# Patient Record
Sex: Female | Born: 1955 | Race: Black or African American | Hispanic: No | Marital: Married | Smoking: Former smoker
Health system: Southern US, Community
[De-identification: ages and names within clinical notes are randomized; demographics above are authoritative.]

## PROBLEM LIST (undated history)

## (undated) DIAGNOSIS — I517 Cardiomegaly: Secondary | ICD-10-CM

## (undated) DIAGNOSIS — G4733 Obstructive sleep apnea (adult) (pediatric): Secondary | ICD-10-CM

## (undated) DIAGNOSIS — E119 Type 2 diabetes mellitus without complications: Secondary | ICD-10-CM

## (undated) DIAGNOSIS — I5032 Chronic diastolic (congestive) heart failure: Secondary | ICD-10-CM

## (undated) DIAGNOSIS — N289 Disorder of kidney and ureter, unspecified: Secondary | ICD-10-CM

## (undated) DIAGNOSIS — I10 Essential (primary) hypertension: Secondary | ICD-10-CM

## (undated) DIAGNOSIS — R5381 Other malaise: Secondary | ICD-10-CM

## (undated) DIAGNOSIS — R569 Unspecified convulsions: Secondary | ICD-10-CM

## (undated) DIAGNOSIS — I272 Pulmonary hypertension, unspecified: Secondary | ICD-10-CM

## (undated) DIAGNOSIS — G35 Multiple sclerosis: Secondary | ICD-10-CM

## (undated) DIAGNOSIS — I639 Cerebral infarction, unspecified: Secondary | ICD-10-CM

## (undated) DIAGNOSIS — J449 Chronic obstructive pulmonary disease, unspecified: Secondary | ICD-10-CM

## (undated) HISTORY — PX: KNEE SURGERY: SHX244

## (undated) HISTORY — PX: MASS EXCISION: SHX2000

## (undated) HISTORY — DX: Multiple sclerosis: G35

## (undated) HISTORY — PX: TUBAL LIGATION: SHX77

## (undated) HISTORY — DX: Unspecified convulsions: R56.9

## (undated) HISTORY — DX: Cerebral infarction, unspecified: I63.9

## (undated) HISTORY — DX: Chronic obstructive pulmonary disease, unspecified: J44.9

## (undated) HISTORY — PX: CARPAL TUNNEL RELEASE: SHX101

## (undated) HISTORY — PX: CHOLECYSTECTOMY: SHX55

## (undated) SURGERY — RIGHT HEART CATH
Anesthesia: Moderate Sedation

---

## 2001-07-22 ENCOUNTER — Encounter: Payer: Self-pay | Admitting: Obstetrics and Gynecology

## 2001-07-22 ENCOUNTER — Encounter: Admission: RE | Admit: 2001-07-22 | Discharge: 2001-07-22 | Payer: Self-pay | Admitting: Obstetrics and Gynecology

## 2001-09-25 ENCOUNTER — Encounter: Payer: Self-pay | Admitting: Obstetrics and Gynecology

## 2001-09-25 ENCOUNTER — Encounter: Admission: RE | Admit: 2001-09-25 | Discharge: 2001-09-25 | Payer: Self-pay | Admitting: Obstetrics and Gynecology

## 2007-01-21 ENCOUNTER — Ambulatory Visit: Payer: Self-pay | Admitting: Cardiovascular Disease

## 2007-01-29 ENCOUNTER — Ambulatory Visit: Payer: Self-pay | Admitting: Cardiovascular Disease

## 2007-01-29 ENCOUNTER — Encounter (HOSPITAL_COMMUNITY): Admission: RE | Admit: 2007-01-29 | Discharge: 2007-02-28 | Payer: Self-pay | Admitting: Cardiovascular Disease

## 2011-02-24 NOTE — Assessment & Plan Note (Signed)
[facility]                       [place] [facility]ARDIOLOG[facility] OFFICE NOTE   NAME:Vegh, CALLEEN ALVIS                      MRN:          [id number]  DATE:01/21/2007                            DOB:          May 31, 1956    Ms. Hargett is a 55 year old patient referred by Dr. Harland Dingwall.  She has  uncontrolled hypertension and an abnormal EKG.  The patient has  multiple coronary risk factors including family history of coronary  disease, hypertension, and diabetes.  Her lipid status is unknown.   She is a nonsmoker.   The patient has been having some exertional dyspnea but not having any  significant chest pain, palpitations, PND, orthopnea.   Her EKG done with Dr. Eudelia Bunch office and here shows a significant LVH  with strain.  In talking to the patient she has had hypertension since  1993, however, due to financial reasons it has not been treated all this  time.  She was recently started on Lotrel by Dr. Harland Dingwall.   The patient has not had a recent stress test or other cardiac workup.  She said there was a question of whether or not she had heart  enlargement.   PAST MEDICAL HISTORY:  Remarkable for being dehydrated 2 years ago with  an overnight stay at [facility].  She has had a tubal ligation,  gallbladder surgery, and a mass under axilla removed.   SHE HAS NO KNOWN ALLERGIES.   She is a nonsmoker and does not drink.  She has had outpatient carpal  tunnel surgery.  She has been a diabetic for 2-3 years.   The patient is under a lot of stress lately.  Her mother has significant  vascular disease with previous MI, what sounds like mesenteric ischemia,  peripheral vascular disease, and is to go to [facility] to have surgery.   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  She says she has poor vision  but has not had any laser surgery.  There is no history of kidney  failure.  From a cardiac perspective  there is no exertional chest pain  or angina, there is no palpitations, PND, orthopnea, or syncope.  She  does not have significant claudication.   MEDICATIONS:  1. Lotrel 5/20.  2. Actos 30 a day.  3. Lantus 20 in the morning and 25 at night.  4. Glipizide 10 a day.   She is married, her husband's health is good.  She has 2 grown children  in their 30s, she is fairly sedentary, she is under a lot of stress  lately due to her mother's illness.   Family History positive for mother with DM and premature CAD   The patient's exam is remarkable for a blood pressure of 160/88, pulse  is 90 and regular.  HEENT:  Normal, carotids are normal without bruits.  LUNGS:  Clear.  There is an S1, S2, with an S4 gallop.  ABDOMEN:  Benign, there is no renal bruits, there is no hepatojugular  reflux.  Distal pulses are intact with no edema.  She does have  a bit of a  peripheral stocking glove neuropathy.  SKIN:  Warm and dry.   EKG shows sinus rhythm with LVH and strain.   IMPRESSION:  The patient's abnormal echocardiogram is simply from LVH  with strain.  She admits to a long period of time where her blood  pressure was not being treated.  I am not a big fan of Lotrel since the  fixed dosages of the medication are difficult to titrate.  Clearly her  blood pressure is still suboptimally controlled and she is relatively  tachycardic.  We will ad Coreg 12.5 b.i.d. to her Lotrel for starters.  The Coreg will have less insulin resistance than other beta blockers in  regards to her diabetes.   The patient needs a stress Myoview study.  At that point we can see how  her blood pressure control is and rule out coronary disease given her  multiple risk factors and abnormal echocardiogram.   The patient will also have a 2D echocardiogram.  She has a systolic  ejection murmur and at that point we can assess left ventricular cavity  size and LVH.  Further recommendations will be based on her response  to  Coreg, her exercise stress test, and then her echo.   I suspect the patient will do well so long as her risk factors,  particularly her diabetes and hypertension are well controlled.  She  will follow up with Dr. Harland Dingwall in regards to her hemoglobin A1c.  Also,  given her risk factors I suspect she should be on a statin drug.  She is  diabetic, overweight, and hypertensive.   Again, I will leave this up to Dr. Harland Dingwall, but I suspect that she would  strongly benefit from a statin drug     Peter C. Eden Emms, MD, [facility]  Electronically Signed    PCN/MedQ  DD: 01/21/2007  DT: 01/21/2007  Job #: [id number]

## 2011-02-24 NOTE — Procedures (Signed)
Gail Chapman, Gail Chapman               ACCOUNT NO.:  [id number]   MEDICAL RECORD NO.:  [id number]          PATIENT TYPE:  REC   LOCATION:                                FACILITY:  [facility]   PHYSICIAN:  Peter C. Eden Emms, MD, FACCDATE OF BIRTH:  Jun 30, 1956   DATE OF PROCEDURE:  DATE OF DISCHARGE:                                ECHOCARDIOGRAM   A 2D ECHOCARDIOGRAM:   INDICATIONS:  Check LV function, chest pain, LVH, abnormal EKG.   Left ventricular cavity size is mildly dilated.  There was severe LVH.  Septal thickness was 18-19 mm.  There was no evidence of outflow tract  destruction.  There were no regional wall motion abnormalities.  Overall  ejection fraction 55%.  Mitral valve was mildly thickened with trivial  MR.  Aortic valve was trileaflet and normal.  There was mild left atrial  enlargement.  Right-sided cardiac chambers were normal.  There was no  evidence of pulmonary hypertension.  Subcostal imaging revealed a small  pericardial effusion with no evidence of tamponade.  There was no ASD,  VSD or source of embolus.   M-mode measurements included an aortic diameter of 25 mm, left atrial  diameter of 40 mm, septal thickness 18-19 mm, posterior wall thickness  17 mm, LV diastolic dimension 41 mm, LV systolic dimension 28 mm.   FINAL IMPRESSION:  1. Severe LVH with no wall motion abnormalities.  EF 55%.  2. Trivial MR.  3. Normal aortic valve.  4. Mild left atrial enlargement.  5. Normal right-sided cardiac chambers.  6. Small pericardial effusion.      Noralyn Pick. Eden Emms, MD, [facility]  Electronically Signed     PCN/MEDQ  D:  01/29/2007  T:  01/29/2007  Job:  [id number]

## 2013-02-19 ENCOUNTER — Inpatient Hospital Stay (HOSPITAL_COMMUNITY): Payer: BC Managed Care – PPO

## 2013-02-19 ENCOUNTER — Emergency Department (HOSPITAL_COMMUNITY): Payer: BC Managed Care – PPO

## 2013-02-19 ENCOUNTER — Encounter (HOSPITAL_COMMUNITY): Payer: Self-pay | Admitting: Emergency Medicine

## 2013-02-19 ENCOUNTER — Inpatient Hospital Stay (HOSPITAL_COMMUNITY)
Admission: EM | Admit: 2013-02-19 | Discharge: 2013-03-06 | DRG: 549 | Disposition: A | Discharge status: Home Health | Payer: BC Managed Care – PPO | Attending: Internal Medicine | Admitting: Internal Medicine

## 2013-02-19 DIAGNOSIS — Z87891 Personal history of nicotine dependence: Secondary | ICD-10-CM

## 2013-02-19 DIAGNOSIS — K3184 Gastroparesis: Secondary | ICD-10-CM | POA: Diagnosis present

## 2013-02-19 DIAGNOSIS — I272 Pulmonary hypertension, unspecified: Secondary | ICD-10-CM | POA: Diagnosis present

## 2013-02-19 DIAGNOSIS — I319 Disease of pericardium, unspecified: Secondary | ICD-10-CM | POA: Diagnosis present

## 2013-02-19 DIAGNOSIS — I5033 Acute on chronic diastolic (congestive) heart failure: Secondary | ICD-10-CM | POA: Diagnosis present

## 2013-02-19 DIAGNOSIS — R4182 Altered mental status, unspecified: Secondary | ICD-10-CM

## 2013-02-19 DIAGNOSIS — I509 Heart failure, unspecified: Secondary | ICD-10-CM | POA: Diagnosis present

## 2013-02-19 DIAGNOSIS — G4730 Sleep apnea, unspecified: Secondary | ICD-10-CM | POA: Insufficient documentation

## 2013-02-19 DIAGNOSIS — N179 Acute kidney failure, unspecified: Secondary | ICD-10-CM

## 2013-02-19 DIAGNOSIS — J9600 Acute respiratory failure, unspecified whether with hypoxia or hypercapnia: Secondary | ICD-10-CM

## 2013-02-19 DIAGNOSIS — I161 Hypertensive emergency: Secondary | ICD-10-CM | POA: Diagnosis present

## 2013-02-19 DIAGNOSIS — I169 Hypertensive crisis, unspecified: Secondary | ICD-10-CM

## 2013-02-19 DIAGNOSIS — I130 Hypertensive heart and chronic kidney disease with heart failure and stage 1 through stage 4 chronic kidney disease, or unspecified chronic kidney disease: Principal | ICD-10-CM | POA: Diagnosis present

## 2013-02-19 DIAGNOSIS — E46 Unspecified protein-calorie malnutrition: Secondary | ICD-10-CM | POA: Diagnosis present

## 2013-02-19 DIAGNOSIS — G9340 Encephalopathy, unspecified: Secondary | ICD-10-CM | POA: Diagnosis present

## 2013-02-19 DIAGNOSIS — D631 Anemia in chronic kidney disease: Secondary | ICD-10-CM | POA: Diagnosis present

## 2013-02-19 DIAGNOSIS — E119 Type 2 diabetes mellitus without complications: Secondary | ICD-10-CM

## 2013-02-19 DIAGNOSIS — Z6827 Body mass index (BMI) 27.0-27.9, adult: Secondary | ICD-10-CM

## 2013-02-19 DIAGNOSIS — I10 Essential (primary) hypertension: Secondary | ICD-10-CM

## 2013-02-19 DIAGNOSIS — E1129 Type 2 diabetes mellitus with other diabetic kidney complication: Secondary | ICD-10-CM | POA: Diagnosis present

## 2013-02-19 DIAGNOSIS — Z79899 Other long term (current) drug therapy: Secondary | ICD-10-CM

## 2013-02-19 DIAGNOSIS — G4733 Obstructive sleep apnea (adult) (pediatric): Secondary | ICD-10-CM | POA: Diagnosis present

## 2013-02-19 DIAGNOSIS — I5032 Chronic diastolic (congestive) heart failure: Secondary | ICD-10-CM

## 2013-02-19 DIAGNOSIS — Z794 Long term (current) use of insulin: Secondary | ICD-10-CM

## 2013-02-19 DIAGNOSIS — E871 Hypo-osmolality and hyponatremia: Secondary | ICD-10-CM | POA: Diagnosis not present

## 2013-02-19 DIAGNOSIS — E872 Acidosis, unspecified: Secondary | ICD-10-CM | POA: Diagnosis not present

## 2013-02-19 DIAGNOSIS — N039 Chronic nephritic syndrome with unspecified morphologic changes: Secondary | ICD-10-CM | POA: Diagnosis present

## 2013-02-19 DIAGNOSIS — R7989 Other specified abnormal findings of blood chemistry: Secondary | ICD-10-CM

## 2013-02-19 DIAGNOSIS — I2789 Other specified pulmonary heart diseases: Secondary | ICD-10-CM | POA: Diagnosis present

## 2013-02-19 DIAGNOSIS — R5381 Other malaise: Secondary | ICD-10-CM | POA: Diagnosis present

## 2013-02-19 DIAGNOSIS — N189 Chronic kidney disease, unspecified: Secondary | ICD-10-CM

## 2013-02-19 HISTORY — DX: Other malaise: R53.81

## 2013-02-19 HISTORY — DX: Type 2 diabetes mellitus without complications: E11.9

## 2013-02-19 HISTORY — DX: Essential (primary) hypertension: I10

## 2013-02-19 HISTORY — DX: Pulmonary hypertension, unspecified: I27.20

## 2013-02-19 HISTORY — DX: Chronic diastolic (congestive) heart failure: I50.32

## 2013-02-19 HISTORY — DX: Obstructive sleep apnea (adult) (pediatric): G47.33

## 2013-02-19 HISTORY — DX: Cardiomegaly: I51.7

## 2013-02-19 LAB — URINALYSIS, ROUTINE W REFLEX MICROSCOPIC
Bilirubin Urine: NEGATIVE
Specific Gravity, Urine: 1.02 (ref 1.005–1.030)
Urobilinogen, UA: 0.2 mg/dL (ref 0.0–1.0)

## 2013-02-19 LAB — URINE MICROSCOPIC-ADD ON

## 2013-02-19 LAB — CBC
HCT: 30.4 % — ABNORMAL LOW (ref 36.0–46.0)
Hemoglobin: 9.5 g/dL — ABNORMAL LOW (ref 12.0–15.0)
Hemoglobin: 9.4 g/dL — ABNORMAL LOW (ref 12.0–15.0)
MCH: 26.3 pg (ref 26.0–34.0)
MCH: 26.9 pg (ref 26.0–34.0)
MCHC: 32 g/dL (ref 30.0–36.0)
MCV: 84.2 fL (ref 78.0–100.0)
MCV: 84.2 fL (ref 78.0–100.0)
RBC: 3.61 MIL/uL — ABNORMAL LOW (ref 3.87–5.11)
RBC: 3.49 MIL/uL — ABNORMAL LOW (ref 3.87–5.11)

## 2013-02-19 LAB — MRSA PCR SCREENING: MRSA by PCR: NEGATIVE

## 2013-02-19 LAB — CARBOXYHEMOGLOBIN
Carboxyhemoglobin: 1.6 % — ABNORMAL HIGH (ref 0.5–1.5)
Methemoglobin: 1.2 % (ref 0.0–1.5)

## 2013-02-19 LAB — BASIC METABOLIC PANEL
BUN: 33 mg/dL — ABNORMAL HIGH (ref 6–23)
CO2: 28 mEq/L (ref 19–32)
Calcium: 9 mg/dL (ref 8.4–10.5)
Creatinine, Ser: 2.02 mg/dL — ABNORMAL HIGH (ref 0.50–1.10)
Glucose, Bld: 136 mg/dL — ABNORMAL HIGH (ref 70–99)

## 2013-02-19 LAB — CREATININE, SERUM: Creatinine, Ser: 2.13 mg/dL — ABNORMAL HIGH (ref 0.50–1.10)

## 2013-02-19 LAB — MAGNESIUM: Magnesium: 2.1 mg/dL (ref 1.5–2.5)

## 2013-02-19 MED ORDER — NITROGLYCERIN IN D5W 200-5 MCG/ML-% IV SOLN
2.0000 ug/min | INTRAVENOUS | Status: DC
  Administered 2013-02-19: 30 ug/min via INTRAVENOUS
  Administered 2013-02-20: 160 ug/min via INTRAVENOUS
  Administered 2013-02-20: 200 ug/min via INTRAVENOUS
  Filled 2013-02-19 (×2): qty 250

## 2013-02-19 MED ORDER — POTASSIUM CHLORIDE CRYS ER 20 MEQ PO TBCR
20.0000 meq | EXTENDED_RELEASE_TABLET | Freq: Every day | ORAL | Status: DC
  Administered 2013-02-20: 20 meq via ORAL
  Filled 2013-02-19: qty 1

## 2013-02-19 MED ORDER — SODIUM CHLORIDE 0.9 % IV SOLN
250.0000 mL | INTRAVENOUS | Status: DC | PRN
  Administered 2013-02-19: 250 mL via INTRAVENOUS

## 2013-02-19 MED ORDER — SODIUM CHLORIDE 0.9 % IJ SOLN
3.0000 mL | Freq: Two times a day (BID) | INTRAMUSCULAR | Status: DC
  Administered 2013-02-19 – 2013-02-22 (×6): 3 mL via INTRAVENOUS
  Administered 2013-02-23: 11:00:00 via INTRAVENOUS
  Administered 2013-02-23: 3 mL via INTRAVENOUS
  Administered 2013-02-24: 09:00:00 via INTRAVENOUS
  Administered 2013-02-25 (×2): 3 mL via INTRAVENOUS

## 2013-02-19 MED ORDER — INSULIN ASPART 100 UNIT/ML ~~LOC~~ SOLN
0.0000 [IU] | Freq: Three times a day (TID) | SUBCUTANEOUS | Status: DC
  Administered 2013-02-20 – 2013-02-26 (×4): 2 [IU] via SUBCUTANEOUS

## 2013-02-19 MED ORDER — DOCUSATE SODIUM 100 MG PO CAPS
100.0000 mg | ORAL_CAPSULE | Freq: Two times a day (BID) | ORAL | Status: DC
  Administered 2013-02-19 – 2013-02-26 (×13): 100 mg via ORAL
  Filled 2013-02-19 (×15): qty 1

## 2013-02-19 MED ORDER — ACETAMINOPHEN 325 MG PO TABS
650.0000 mg | ORAL_TABLET | ORAL | Status: DC | PRN
  Administered 2013-02-19: 650 mg via ORAL
  Filled 2013-02-19: qty 2

## 2013-02-19 MED ORDER — FUROSEMIDE 10 MG/ML IJ SOLN
40.0000 mg | Freq: Once | INTRAMUSCULAR | Status: AC
  Administered 2013-02-19: 40 mg via INTRAVENOUS
  Filled 2013-02-19: qty 4

## 2013-02-19 MED ORDER — SODIUM CHLORIDE 0.9 % IJ SOLN: 3.0000 mL | INTRAMUSCULAR | Status: DC | PRN

## 2013-02-19 MED ORDER — NITROGLYCERIN IN D5W 200-5 MCG/ML-% IV SOLN
5.0000 ug/min | Freq: Once | INTRAVENOUS | Status: AC
  Administered 2013-02-19: 5 ug/min via INTRAVENOUS
  Filled 2013-02-19: qty 250

## 2013-02-19 MED ORDER — ATORVASTATIN CALCIUM 40 MG PO TABS
40.0000 mg | ORAL_TABLET | Freq: Every day | ORAL | Status: DC
  Administered 2013-02-19 – 2013-02-26 (×8): 40 mg via ORAL
  Filled 2013-02-19 (×8): qty 1

## 2013-02-19 MED ORDER — HYDRALAZINE HCL 20 MG/ML IJ SOLN
20.0000 mg | Freq: Once | INTRAMUSCULAR | Status: AC
  Administered 2013-02-19: 20 mg via INTRAVENOUS
  Filled 2013-02-19: qty 1

## 2013-02-19 MED ORDER — FUROSEMIDE 10 MG/ML IJ SOLN
20.0000 mg/h | INTRAVENOUS | Status: DC
  Administered 2013-02-19 – 2013-02-20 (×2): 20 mg/h via INTRAVENOUS
  Filled 2013-02-19 (×5): qty 25

## 2013-02-19 MED ORDER — INSULIN ASPART 100 UNIT/ML ~~LOC~~ SOLN
4.0000 [IU] | Freq: Three times a day (TID) | SUBCUTANEOUS | Status: DC
  Administered 2013-02-20 – 2013-02-21 (×4): 4 [IU] via SUBCUTANEOUS
  Administered 2013-02-23 (×3): via SUBCUTANEOUS
  Administered 2013-02-26: 4 [IU] via SUBCUTANEOUS

## 2013-02-19 MED ORDER — AMLODIPINE BESYLATE 5 MG PO TABS
5.0000 mg | ORAL_TABLET | Freq: Every day | ORAL | Status: DC
  Administered 2013-02-19 – 2013-02-22 (×4): 5 mg via ORAL
  Filled 2013-02-19 (×5): qty 1

## 2013-02-19 MED ORDER — INSULIN DETEMIR 100 UNIT/ML ~~LOC~~ SOLN
14.0000 [IU] | Freq: Every day | SUBCUTANEOUS | Status: DC
  Administered 2013-02-19 – 2013-02-25 (×7): 14 [IU] via SUBCUTANEOUS
  Filled 2013-02-19 (×9): qty 0.14

## 2013-02-19 MED ORDER — HYDRALAZINE HCL 25 MG PO TABS
25.0000 mg | ORAL_TABLET | Freq: Three times a day (TID) | ORAL | Status: DC
  Administered 2013-02-19 – 2013-02-21 (×5): 25 mg via ORAL
  Filled 2013-02-19 (×8): qty 1

## 2013-02-19 MED ORDER — ASPIRIN 81 MG PO CHEW
324.0000 mg | CHEWABLE_TABLET | Freq: Once | ORAL | Status: AC
  Administered 2013-02-19: 324 mg via ORAL
  Filled 2013-02-19: qty 4

## 2013-02-19 MED ORDER — HYDRALAZINE HCL 20 MG/ML IJ SOLN
5.0000 mg | INTRAMUSCULAR | Status: DC | PRN
  Administered 2013-02-19 – 2013-02-20 (×2): 5 mg via INTRAVENOUS
  Filled 2013-02-19 (×2): qty 1

## 2013-02-19 MED ORDER — HEPARIN SODIUM (PORCINE) 5000 UNIT/ML IJ SOLN
5000.0000 [IU] | Freq: Three times a day (TID) | INTRAMUSCULAR | Status: DC
  Administered 2013-02-19 – 2013-02-20 (×2): 5000 [IU] via SUBCUTANEOUS
  Filled 2013-02-19 (×5): qty 1

## 2013-02-19 MED ORDER — ONDANSETRON HCL 4 MG/2ML IJ SOLN
4.0000 mg | Freq: Four times a day (QID) | INTRAMUSCULAR | Status: DC | PRN
  Administered 2013-02-21 – 2013-02-26 (×2): 4 mg via INTRAVENOUS
  Filled 2013-02-19 (×2): qty 2

## 2013-02-19 MED ORDER — CARVEDILOL 12.5 MG PO TABS
12.5000 mg | ORAL_TABLET | Freq: Two times a day (BID) | ORAL | Status: DC
  Administered 2013-02-20 – 2013-02-23 (×7): 12.5 mg via ORAL
  Filled 2013-02-19 (×9): qty 1

## 2013-02-19 MED ORDER — ALPRAZOLAM 0.25 MG PO TABS
0.2500 mg | ORAL_TABLET | Freq: Two times a day (BID) | ORAL | Status: DC | PRN
  Administered 2013-02-20: 0.25 mg via ORAL
  Filled 2013-02-19: qty 1

## 2013-02-19 MED ORDER — PANTOPRAZOLE SODIUM 40 MG PO TBEC
40.0000 mg | DELAYED_RELEASE_TABLET | Freq: Every day | ORAL | Status: DC
  Administered 2013-02-19 – 2013-02-23 (×5): 40 mg via ORAL
  Filled 2013-02-19 (×5): qty 1

## 2013-02-19 NOTE — ED Provider Notes (Signed)
History     CSN: [id number]  Arrival date & time 02/19/13  1353   None     Chief Complaint  Patient presents with  . Shortness of Breath    (Consider location/radiation/quality/duration/timing/severity/associated sxs/prior treatment) HPI  57 year old female with a past medical history of diabetes, hypertension, pulmonary hypertension and congestive heart failure presents to the emergency department with chief complaint of shortness of breath.  When asked what brings her to the emergency department the patient states "heart failure."  Patient states that over the past 3 months she has had a 30 pound weight gain.  She is from [place].  She and her husband to the history.  They expressed frustration with what they considered to be an adequate care for her CHF.  Patient states that she is alert and worsening shortness of breath.  She is normally at home and uses CPAP at night and is on 2 L of oxygen via nasal cannula. Patient also has a history of COPD.  She denies any wheezing, chest tightness. Denies fevers, chills, myalgias, arthralgias. Denies  chest tightness or pressure or pain, radiation to left arm, jaw or back, or diaphoresis. Denies dysuria, flank pain, suprapubic pain, frequency, urgency, or hematuria. Denies headaches, light headedness, weakness, visual disturbances. Denies abdominal pain, nausea, vomiting, diarrhea or constipation. She has been taking 20 mg of lasix     Past Medical History  Diagnosis Date  . Diabetes mellitus without complication   . CHF (congestive heart failure)   . Pulmonary hypertension   . Sleep apnea     Past Surgical History  Procedure Laterality Date  . Knee surgery      History reviewed. No pertinent family history.  History  Substance Use Topics  . Smoking status: Former Games developer  . Smokeless tobacco: Not on file  . Alcohol Use: No    OB History   Grav Para Term Preterm Abortions TAB SAB Ect Mult Living                   Review of Systems  Ten systems reviewed and are negative for acute change, except as noted in the HPI.   Allergies  Vancomycin  Home Medications  No current outpatient prescriptions on file.  BP 224/93  Pulse 82  Temp(Src) 99.4 F (37.4 C) (Oral)  Resp 23  Ht 5' 1.5" (1.562 m)  Wt 213 lb (96.616 kg)  BMI 39.6 kg/m2  SpO2 98%  Physical Exam Physical Exam  Nursing note and vitals reviewed. Constitutional: She is oriented to person, place, and time. She appears chronically ill and dyspneic. HENT:  Head: Normocephalic and atraumatic.  Eyes: Conjunctivae normal and EOM are normal. Pupils are equal, round, and reactive to light. No scleral icterus.  Neck: Normal range of motion.  Cardiovascular: Normal rate, regular rhythm and normal heart sounds.  No murmur heard. 2 + pitting edema of the LE. Swelling of the hands. Pulmonary/Chest: Poor air movement. Appears tight. No abnormal breath sound. Abdominal: Soft. Distended. +fluid wave Neurological: She is alert and oriented to person, place, and time.  Skin: Skin is warm and dry. She is not diaphoretic.    ED Course  Procedures (including critical care time)  Labs Reviewed  PRO B NATRIURETIC PEPTIDE - Abnormal; Notable for the following:    Pro B Natriuretic peptide (BNP) 45515.0 (*)    All other components within normal limits  CBC - Abnormal; Notable for the following:    RBC 3.61 (*)  Hemoglobin 9.5 (*)    HCT 30.4 (*)    RDW 17.7 (*)    All other components within normal limits  BASIC METABOLIC PANEL - Abnormal; Notable for the following:    Glucose, Bld 136 (*)    BUN 33 (*)    Creatinine, Ser 2.02 (*)    GFR calc non Af Amer 27 (*)    GFR calc Af Amer 31 (*)    All other components within normal limits  URINALYSIS, ROUTINE W REFLEX MICROSCOPIC  POCT I-STAT TROPONIN I   No results found.  Date: 02/19/2013  Rate: 84  Rhythm: normal sinus rhythm  QRS Axis: normal  Intervals: normal  ST/T Wave  abnormalities: nonspecific T wave changes  Conduction Disutrbances:none  Narrative Interpretation:   Old EKG Reviewed: none available   . 1. Acute exacerbation of CHF (congestive heart failure)   2. Hypertension   3. Serum creatinine raised       MDM  2:52 PM BP 224/93  Pulse 82  Temp(Src) 99.4 F (37.4 C) (Oral)  Resp 23  Ht 5' 1.5" (1.562 m)  Wt 213 lb (96.616 kg)  BMI 39.6 kg/m2  SpO2 98% Patient with hypertension. Large edematous abdomen. Swelling of the extremities. CHF, respiratory and acs work up in progress. Symmetric swelling throughout.  3:49 PM Patient with poor cxr quality. Appears to be in acute CHF. She has a very highly elevated Pro-BNP. I have stared the patient on 40 mg IV lasix, nitro to reduce afterload and asa.    3:55 PM I have spoken with Trish from [facility] cardiology. The patetient will be admitted for acute CHF. Patient with elevated creatinine, no previous to compare. Her ekg is non concerning for acute ischemia. The patient appears reasonably stabilized for admission considering the current resources, flow, and capabilities available in the ED at this time, and I doubt any other [facility] requiring further screening and/or treatment in the ED prior to admission.        Arthor Captain, PA-C 02/19/13 1558

## 2013-02-19 NOTE — ED Notes (Addendum)
BIB family. C/o SOB. States Hx of CHF. NO CP at this time. Recent weight gain of >5 lbs per patient. 3L [place] at home. Uses CPAP at night. Patient not on O2 when brought to ED by family. Patient states recent rehab admission, she had left AMA due to "they werent taking care of me. Patient residing at home with husband

## 2013-02-19 NOTE — ED Provider Notes (Signed)
Medical screening examination/treatment/procedure(s) were conducted as a shared visit with non-physician practitioner(s) and myself.  I personally evaluated the patient during the encounter  SOB, DOE, orthopnea x 1 month, recently seen in [place] and left rehab when continued to gain weight. No meds x 4 days. Denies chest pain. Bibasilar rales, +3 edema to mid thigh, +JVD and orthopnea. ASA, lasix, NTG gtt for hypertensive crisis. BP 188/80  Pulse 90  Temp(Src) 99.4 F (37.4 C) (Oral)  Resp 15  Ht 5' 1.5" (1.562 m)  Wt 213 lb (96.616 kg)  BMI 39.6 kg/m2  SpO2 99%   [name], MD 02/19/13 Ernestina [place]

## 2013-02-19 NOTE — ED Notes (Signed)
Cardiology PA at the bedside

## 2013-02-19 NOTE — Procedures (Addendum)
Central Venous Catheter Insertion Procedure Note Gail Chapman [id number] 01-27-1956  Procedure: Insertion of Central Venous Catheter Indications: Drug and/or fluid administration  Procedure Details Consent: Risks of procedure as well as the alternatives and risks of each were explained to the (patient/caregiver).  Consent for procedure obtained. Time Out: Verified patient identification, verified procedure, site/side was marked, verified correct patient position, special equipment/implants available, medications/allergies/relevent history reviewed, required imaging and test results available.  Performed  Maximum sterile technique was used including antiseptics, cap, gloves, gown, hand hygiene, mask and sheet. Skin prep: Chlorhexidine; local anesthetic administered A antimicrobial bonded/coated triple lumen catheter was placed in the right internal jugular vein using the Seldinger technique.  Evaluation Blood flow good Complications: No apparent complications Patient did tolerate procedure well. Chest X-ray ordered to verify placement.  CXR: pending.  Roby Spalla 02/19/2013, 6:06 PM

## 2013-02-19 NOTE — ED Notes (Signed)
Dr. Kittie Plater at bedside placing right internal jugular catheter. Pt calm, cooperative.

## 2013-02-19 NOTE — H&P (Addendum)
Patient ID: Gail Chapman MRN: [id number], DOB/AGE: Aug 25, 1956   Admit date: 02/19/2013  Primary Physician: None Primary Cardiologist: previously seen by P. Gail Emms, MD in [place] in 2008.  More recently she has been seeing Dr. Rockne Chapman and Dr. Ladona Chapman with [facility].  Pt. Profile:  57 y/o female with h/o HTN and DM, who presented to the ED today with dyspnea and HTN.  Problem List  Past Medical History  Diagnosis Date  . Diabetes mellitus without complication   . Chronic diastolic CHF (congestive heart failure)     a. 01/2007 MV: No isch/infarct, nl EF;  b. 2012 Cath: "normal" per patient.  Performed in [place], [place] by Dr. Graciela Chapman.  . Pulmonary hypertension     a. on home O2 @ 3lpm 24hrs/day  . HTN (hypertension)     a. Dx in 1993.  Marland Kitchen LVH (left ventricular hypertrophy)   . OSA (obstructive sleep apnea)     a. uses CPAP  . Physical deconditioning     Past Surgical History  Procedure Laterality Date  . Knee surgery    . Carpal tunnel release    . Tubal ligation    . Cholecystectomy    . Mass excision      a. under axilla.    Allergies  Allergies  Allergen Reactions  . Clonidine Derivatives     Hand itching  . Hydralazine     Visual disturbances   . Labetalol     Fatigue   . Vancomycin Rash   HPI  57 y/o female with a h/o HTN, DM, diastolic HF and PAH.  She saw Dr. Eden Chapman in 2008 secondary to abnl ECG, later felt to represent LVH.  She underwent stress testing that was negative.  She has not f/u with [place] since.  She has however continued to f/u with cardiology in [place].  She has struggled with difficult to control htn and recurrent CHF over the years.  She underwent diagnostic cath about 2 yrs ago by Dr. Graciela Chapman and was told that her coronaries were fine but that she had pulmonary hypertension.  She has been on a variety of antihypertensive regimens and also home O2.  Earlier this year, she began to experience progressive weight gain,  dyspnea, and orthopnea.  Her weight increased from roughly 170 to the low 200's over a one month period or so and she was admitted to [facility] in April.  After a 1 week stay, during which she had attempted diuresis with very limited success, she was discharged to rehab, at a weight of 208 lbs, secondary to deconditioning.  While in rehab, she continued to experience dyspnea with any activity, increasing abdominal girth, and orthopnea.  She also noted fatigue and sleepiness, which she attributes to labetalol therapy, which was started a few months ago.  Her weight in rehab climbed to 213 and she and her husband decided to leave rehab on this past Saturday because they felt like they were not getting appropriate care.  Since Saturday, she has been off of all of her medications and has progressively felt worse.  Her husband brought her to the [facility] ED today because they no longer wish to be cared for in [place].  Here, she is sitting up because she becomes dyspneic with any degree of sitting back.  She denies chest pain.  ECG is non-acute.  Initial troponin is nl, while pBNP is elevated @ 45,515.  Home Medications  Prior to Admission medications   Medication  Sig Start Date End Date Taking? Authorizing Provider  atorvastatin (LIPITOR) 40 MG tablet Take 40 mg by mouth daily.   Yes Historical Provider, MD  docusate sodium (COLACE) 100 MG capsule Take 100 mg by mouth 2 (two) times daily.   Yes Historical Provider, MD  furosemide (LASIX) 40 MG tablet Take 40 mg by mouth daily.   Yes Historical Provider, MD  insulin aspart (NOVOLOG) 100 UNIT/ML injection Inject 4 Units into the skin 3 (three) times daily with meals. Per sliding scale   Yes Historical Provider, MD  insulin detemir (LEVEMIR) 100 UNIT/ML injection Inject 14 Units into the skin at bedtime. Hold if CBG<150   Yes Historical Provider, MD  labetalol (NORMODYNE) 200 MG tablet Take 200 mg by mouth 3 (three) times daily.   Yes Historical Provider,  MD  NIFEdipine (PROCARDIA-XL/ADALAT-CC/NIFEDICAL-XL) 30 MG 24 hr tablet Take 60 mg by mouth daily.   Yes Historical Provider, MD  pantoprazole (PROTONIX) 40 MG tablet Take 40 mg by mouth daily.   Yes Historical Provider, MD  promethazine (PHENERGAN) 25 MG suppository Place 25 mg rectally at bedtime.   Yes Historical Provider, MD   Family History  Family History  Problem Relation Age of Onset  . Diabetes Mother   . CAD Mother    Social History  History   Social History  . Marital Status: Married    Spouse Name: N/A    Number of Children: N/A  . Years of Education: N/A   Occupational History  . Not on file.   Social History Main Topics  . Smoking status: Former Games developer  . Smokeless tobacco: Not on file  . Alcohol Use: No  . Drug Use: No  . Sexually Active: Not on file   Other Topics Concern  . Not on file   Social History Narrative   Lives in [place] with her husband.  They have 2 grown children.  She is on disability.    Review of Systems General:  No chills, fever, night sweats or weight changes.  Cardiovascular:  No chest pain, +++ dyspnea on exertion and at rest, LEE edema, orthopnea, paroxysmal nocturnal dyspnea. Dermatological: No rash, lesions/masses Respiratory: No cough, +++ dyspnea Urologic: No hematuria, dysuria Abdominal:   Significant increase in abd girth with pitting abdominal edema.  No nausea, vomiting, diarrhea, bright red blood per rectum, melena, or hematemesis Neurologic:  No visual changes, wkns, changes in mental status. All other systems reviewed and are otherwise negative except as noted above.  Physical Exam  Blood pressure 246/109, pulse 85, temperature 99.4 F (37.4 C), temperature source Oral, resp. rate 25, height 5' 1.5" (1.562 m), weight 213 lb (96.616 kg), SpO2 97.00%.  General: Pleasant, + orthopnea. Psych: Normal affect. Neuro: Alert and oriented X 3. Moves all extremities spontaneously. HEENT: Normal  Neck: Supple without  bruits.  Neck veins to jaw. Carotids 2+ bilaterally without bruits Lungs:  Resp regular and unlabored, crackles in right base. Heart: RRR no s3, + s4, 2/6 TR Abdomen: protuberant, firm and edematous.  nontender, BS + x 4.  Extremities: No clubbing, cyanosis.  3-4+ bilat LEE to thighs.  bilat hand edema. DP/PT/Radials 2+ and equal bilaterally.  Labs  Trop i, poc: 0.02  PBNP: 45,515  Lab Results  Component Value Date   WBC 6.7 02/19/2013   HGB 9.5* 02/19/2013   HCT 30.4* 02/19/2013   MCV 84.2 02/19/2013   PLT 223 02/19/2013     Recent Labs Lab 02/19/13 1429  NA 141  K  4.5  CL 105  CO2 28  BUN 33*  CREATININE 2.02*  CALCIUM 9.0  GLUCOSE 136*   Radiology/Studies  Dg Chest 2 View  02/19/2013   *RADIOLOGY REPORT*  Clinical Data: Chest pain.  Short of breath.  Diabetic.  CHF.  CHEST - 2 VIEW  Comparison: None.  Findings: Technically suboptimal examination.  This is due to both portable technique and obese body habitus.  The pannus projects over the inferior chest.  The cardiopericardial silhouette appears enlarged.  Some this may represent pericardial fat pad.  Lung parenchyma is difficult to evaluate.   On the lateral view, there are small bilateral pleural effusions and thickening of the fissures.  The lateral view suggests a moderate pulmonary edema. Lateral view is actually more diagnostic on the frontal view.  IMPRESSION:  1.  Markedly suboptimal study due to obese habitus and technique. 2.  Overall, the appearance of the chest suggests moderate pulmonary edema and CHF.   Original Report Authenticated By: Andreas Newport, M.D.   ECG: Nsr, 84, inflat twi.  ASSESSMENT AND PLAN  1.  Acute on chronic diastolic chf/Hypertensive urgency:  Pt presents with acute on chronic volume overload likely exacerbated by coming off of all of her medicines this past weekend with marked resultant hypertension.  She has massive volume overload on exam, though from her perspective, its barely changed in  over a month.  Admit to 2900stepdown.  Aggressively diurese with lasix infusion (central line to be placed) and attempt to slowly bring BP down.  IV NTG has been started here in the ED.  She is not willing to take labetalol any longer as she feels that this causes her to be "drugged up" and sleepy.  She was on carvedilol in the past (started by PN in 2008) and thinks that she tolerated that just fine.  Therefor, will initiate carvedilol 12.5 bid.  Her creatinine is elevated and thus will avoid acei/arb at this time.  She does not tolerate hydralazine 2/2 visual disturbances however acutely, we will use this intravenously to get her pressure down.  She has had itching of her hands with clonidine.  Will add amlodipine and continually reassess her BP as she will likely require an additional agent(s) and further titration of meds.  Check echo.   2.  HTN:  As above.  3. Pulm HTN:  Echo.  Cont O2.  4.  OSA:  Cont cpap.  5.  DM:  Add ssi.  6.  Acute renal failure:  Creat 2.02 - ? Chronicity.  Follow closely with diuresis.  7. Acute respiratory failure  Signed, Nicolasa Ducking, NP 02/19/2013, 4:33 PM  Patient seen and examined independently. Gilford Raid, NP note reviewed carefully - agree with his assessment and plan. I have edited the note based on my findings.   Ms. Louth presents with massive edema/anasarca in setting of hypertensive crisis and renal failure. Suspect most of renal dysfunction is chronic. 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   On arrival BP 262/110. BNP 45K. She has intolerances to multi anti-HTN meds which have complicated her care. Now on IV NTG. We have ordered hydralazine, amlodipine and carvedilol. Goal is to get SBP in the 150-170 range.   Suspect she has severe diastolic HF with secondary PH. Will start IV lasix gtt at 20/hr and see response. Place in SDU to  follow CVPs and co-ox.  Check echo. Ultrafiltration may also be a consideration.  Daniel Bensimhon,MD 6:06 PM

## 2013-02-20 ENCOUNTER — Inpatient Hospital Stay (HOSPITAL_COMMUNITY): Payer: BC Managed Care – PPO

## 2013-02-20 DIAGNOSIS — I10 Essential (primary) hypertension: Secondary | ICD-10-CM

## 2013-02-20 DIAGNOSIS — I509 Heart failure, unspecified: Secondary | ICD-10-CM

## 2013-02-20 DIAGNOSIS — I5033 Acute on chronic diastolic (congestive) heart failure: Secondary | ICD-10-CM

## 2013-02-20 DIAGNOSIS — N189 Chronic kidney disease, unspecified: Secondary | ICD-10-CM

## 2013-02-20 DIAGNOSIS — I319 Disease of pericardium, unspecified: Secondary | ICD-10-CM

## 2013-02-20 DIAGNOSIS — J9600 Acute respiratory failure, unspecified whether with hypoxia or hypercapnia: Secondary | ICD-10-CM

## 2013-02-20 LAB — CARBOXYHEMOGLOBIN
Carboxyhemoglobin: 1.6 % — ABNORMAL HIGH (ref 0.5–1.5)
Methemoglobin: 1 % (ref 0.0–1.5)
Total hemoglobin: 9.4 g/dL — ABNORMAL LOW (ref 12.0–16.0)

## 2013-02-20 LAB — GLUCOSE, CAPILLARY
Glucose-Capillary: 180 mg/dL — ABNORMAL HIGH (ref 70–99)
Glucose-Capillary: 120 mg/dL — ABNORMAL HIGH (ref 70–99)
Glucose-Capillary: 174 mg/dL — ABNORMAL HIGH (ref 70–99)

## 2013-02-20 LAB — PRO B NATRIURETIC PEPTIDE: Pro B Natriuretic peptide (BNP): 57495 pg/mL — ABNORMAL HIGH (ref 0–125)

## 2013-02-20 LAB — HEPARIN LEVEL (UNFRACTIONATED)
Heparin Unfractionated: 0.35 IU/mL (ref 0.30–0.70)
Heparin Unfractionated: 0.48 IU/mL (ref 0.30–0.70)

## 2013-02-20 LAB — COMPREHENSIVE METABOLIC PANEL
Albumin: 2.3 g/dL — ABNORMAL LOW (ref 3.5–5.2)
Alkaline Phosphatase: 150 U/L — ABNORMAL HIGH (ref 39–117)
BUN: 35 mg/dL — ABNORMAL HIGH (ref 6–23)
CO2: 28 mEq/L (ref 19–32)
Chloride: 107 mEq/L (ref 96–112)
Creatinine, Ser: 2.03 mg/dL — ABNORMAL HIGH (ref 0.50–1.10)
GFR calc non Af Amer: 27 mL/min — ABNORMAL LOW (ref >90)
Potassium: 4.3 mEq/L (ref 3.5–5.1)
Total Bilirubin: 0.3 mg/dL (ref 0.3–1.2)

## 2013-02-20 LAB — TSH: TSH: 1.681 u[IU]/mL (ref 0.350–4.500)

## 2013-02-20 MED ORDER — NICARDIPINE HCL IN NACL 40-0.83 MG/200ML-% IV SOLN
5.0000 mg/h | INTRAVENOUS | Status: DC
  Administered 2013-02-20 – 2013-02-21 (×3): 5 mg/h via INTRAVENOUS
  Filled 2013-02-20 (×3): qty 200

## 2013-02-20 MED ORDER — SODIUM CHLORIDE 0.9 % IV BOLUS (SEPSIS)
500.0000 mL | Freq: Once | INTRAVENOUS | Status: AC
  Administered 2013-02-20: 1000 mL via INTRAVENOUS

## 2013-02-20 MED ORDER — NICARDIPINE HCL IN NACL 20-0.86 MG/200ML-% IV SOLN
5.0000 mg/h | INTRAVENOUS | Status: DC
  Filled 2013-02-20: qty 200

## 2013-02-20 MED ORDER — HEPARIN BOLUS VIA INFUSION
4000.0000 [IU] | Freq: Once | INTRAVENOUS | Status: AC
  Administered 2013-02-20: 4000 [IU] via INTRAVENOUS
  Filled 2013-02-20: qty 4000

## 2013-02-20 MED ORDER — METOLAZONE 2.5 MG PO TABS
2.5000 mg | ORAL_TABLET | Freq: Once | ORAL | Status: AC
  Administered 2013-02-20: 2.5 mg via ORAL
  Filled 2013-02-20: qty 1

## 2013-02-20 MED ORDER — HEPARIN (PORCINE) IN NACL 100-0.45 UNIT/ML-% IJ SOLN
1200.0000 [IU]/h | INTRAMUSCULAR | Status: DC
  Administered 2013-02-20 – 2013-02-22 (×4): 1100 [IU]/h via INTRAVENOUS
  Administered 2013-02-24: 1200 [IU]/h via INTRAVENOUS
  Administered 2013-02-24: 1000 [IU]/h via INTRAVENOUS
  Filled 2013-02-20 (×9): qty 250

## 2013-02-20 MED ORDER — HEPARIN BOLUS VIA INFUSION
3000.0000 [IU] | Freq: Once | INTRAVENOUS | Status: DC
  Filled 2013-02-20: qty 3000

## 2013-02-20 MED ORDER — LIDOCAINE HCL (PF) 1 % IJ SOLN
INTRAMUSCULAR | Status: AC
  Administered 2013-02-20: 15 mg
  Filled 2013-02-20: qty 10

## 2013-02-20 MED ORDER — LIDOCAINE HCL (PF) 1 % IJ SOLN
INTRAMUSCULAR | Status: AC
  Administered 2013-02-20: 15 mL
  Filled 2013-02-20: qty 10

## 2013-02-20 MED ORDER — NICARDIPINE HCL IN NACL 20-0.86 MG/200ML-% IV SOLN
5.0000 mg/h | INTRAVENOUS | Status: DC
  Administered 2013-02-20: 0.5 mg/h via INTRAVENOUS
  Filled 2013-02-20: qty 200

## 2013-02-20 MED ORDER — HEPARIN (PORCINE) IN NACL 100-0.45 UNIT/ML-% IJ SOLN
1100.0000 [IU]/h | INTRAMUSCULAR | Status: DC
  Filled 2013-02-20: qty 250

## 2013-02-20 NOTE — Progress Notes (Addendum)
   ANTICOAGULATION CONSULT NOTE - Follow Up Consult  Pharmacy Consult for Heparin Indication: Ultrafiltration  Allergies  Allergen Reactions  . Clonidine Derivatives     Hand itching  . Hydralazine     Visual disturbances   . Labetalol     Fatigue   . Vancomycin Rash    Patient Measurements: Height: 5' 1.5" (156.2 cm) Weight: 211 lb 6.7 oz (95.9 kg) (using stand up scale) IBW/kg (Calculated) : 48.95 Heparin Dosing Weight: 71.6kg  Vital Signs: Temp: 97.7 F (36.5 C) (05/15 1934) Temp src: Oral (05/15 1934) BP: 155/64 mmHg (05/15 2100) Pulse Rate: 63 (05/15 2100)  Labs:  Recent Labs  02/19/13 1429 02/19/13 2200 02/19/13 2205 02/20/13 0500 02/20/13 2100  HGB 9.5* 9.4*  --   --   --   HCT 30.4* 29.4*  --   --   --   PLT 223 252  --   --   --   HEPARINUNFRC  --   --   --   --  0.35  CREATININE 2.02* 2.13*  --  2.03*  --   TROPONINI  --   --  <0.30  --   --     Estimated Creatinine Clearance: 33.9 ml/min (by C-G formula based on Cr of 2.03).   Medications:  Heparin 1100 units/hr   Assessment: Gail Chapman started on heparin with plans to start ultrafiltration. Heparin level (0.35) is therapeutic, however level was drawn from same line heparin infusion was running through. RN has ordered a stat redraw - will follow-up and adjust if needed. Heparin level >0.3 is required prior to ultrafiltration initiation.  - H/H and Plts stable - No significant bleeding reported  - Heparin weight: 71.6kg  Goal of Therapy:  Heparin level 0.3-0.6 units/ml Monitor platelets by anticoagulation protocol: Yes   Plan:  1. Continue heparin 1100 units/hr (11 ml/hr) 2. Follow-up heparin level redraw  Cleon Dew [phone] 02/20/2013,9:35 PM    Addendum:  Heparin level recheck remains therapeutic (0.48). I have notified RN to facilitate ultrafiltration initiation.   Plan: 1. Continue heparin 1100 units/hr (11 ml/hr) 2. Check heparin level in 6 hours to confirm  therapeutic, then heparin level and cbc q12h while on ultrafiltration  Wilfred Lacy, PharmD Clinical Pharmacist [phone] 02/20/2013, 10:38 PM

## 2013-02-20 NOTE — Progress Notes (Signed)
  Echo reviewed. Normal LV function. Mod to severe LVH. RV mildly HK. Large pericardial effusion without tamponade. Question of amyloid raised (vs hypertensive CM)  No clinical evidence of tamponade. Will need to follow closely. Will repeat echo after diuresis. If not improving may need window.   Will need work-up for amyloid with SPEP/UPEP and possible fat pad biopsy. Cannot tolerate cMRI at this point.   Truman Hayward 4:38 PM

## 2013-02-20 NOTE — Progress Notes (Signed)
Echocardiogram 2D Echocardiogram has been performed.  Gail Chapman 02/20/2013, 11:41 AM

## 2013-02-20 NOTE — Progress Notes (Signed)
ANTICOAGULATION CONSULT NOTE - Initial Consult  Pharmacy Consult for Heparin Indication: Ultrafiltration  Allergies  Allergen Reactions  . Clonidine Derivatives     Hand itching  . Hydralazine     Visual disturbances   . Labetalol     Fatigue   . Vancomycin Rash    Patient Measurements: Height: 5' 1.5" (156.2 cm) Weight: 211 lb 6.7 oz (95.9 kg) (using stand up scale) IBW/kg (Calculated) : 48.95 Heparin Dosing Weight: 75kg  Vital Signs: Temp: 97.3 F (36.3 C) (05/15 0800) Temp src: Oral (05/15 0800) BP: 205/72 mmHg (05/15 0700) Pulse Rate: 80 (05/15 0700)  Labs:  Recent Labs  02/19/13 1429 02/19/13 2200 02/19/13 2205 02/20/13 0500  HGB 9.5* 9.4*  --   --   HCT 30.4* 29.4*  --   --   PLT 223 252  --   --   CREATININE 2.02* 2.13*  --  2.03*  TROPONINI  --   --  <0.30  --     Estimated Creatinine Clearance: 33.9 ml/min (by C-G formula based on Cr of 2.03).   Medical History: Past Medical History  Diagnosis Date  . Diabetes mellitus without complication   . Chronic diastolic CHF (congestive heart failure)     a. 01/2007 MV: No isch/infarct, nl EF;  b. 2012 Cath: "normal" per patient.  Performed in [place], [place] by Dr. Graciela Husbands.  . Pulmonary hypertension     a. on home O2 @ 3lpm 24hrs/day  . HTN (hypertension)     a. Dx in 1993.  Marland Kitchen LVH (left ventricular hypertrophy)   . OSA (obstructive sleep apnea)     a. uses CPAP  . Physical deconditioning       Assessment: 56yof admitted with volume overload.  She is slow to diurese despite IV furosemide infusion 20mg /hr.  Plan to initiate heparin for anticoagulation and start ultrafiltration for fluid removal once HL > 0.3.   Goal of Therapy:  Heparin level 0.3-0.7 units/ml Monitor platelets by anticoagulation protocol: Yes   Plan:  Heparin bolus 4000 uts IV x1 at 1700 today Begin heparin drip 1100 uts/hr  Draw Heparin level 4hr after heparin drip begun  q12 heparin level and cbc while on UF  [employer].D. CPP, BCPS Clinical Pharmacist [phone] 02/20/2013 12:07 PM

## 2013-02-20 NOTE — Progress Notes (Signed)
Advanced Heart Failure Rounding Note   Subjective:    57 y/o female with a h/o HTN, DM, diastolic HF and PAH. Admitted 5/14 with HTN crisis (BP 260/120) and massive volume overload.   Started lasix gtt at 20 last night. On high-dose NTG drip (145mcg/min!) with systolics ~ 200. Beginning to diurese. Still SOB and very edematous.     Objective:   Weight Range:  Vital Signs:   Temp:  [97.9 F (36.6 C)-99.4 F (37.4 C)] 97.9 F (36.6 C) (05/15 0400) Pulse Rate:  [80-93] 80 (05/15 0700) Resp:  [0-30] 17 (05/15 0700) BP: (162-249)/(69-145) 205/72 mmHg (05/15 0700) SpO2:  [97 %-100 %] 100 % (05/15 0700) Weight:  [95.9 kg (211 lb 6.7 oz)-96.616 kg (213 lb)] 95.9 kg (211 lb 6.7 oz) (05/15 0500)    Weight change: Filed Weights   02/19/13 1402 02/19/13 1925 02/20/13 0500  Weight: 96.616 kg (213 lb) 95.9 kg (211 lb 6.7 oz) 95.9 kg (211 lb 6.7 oz)    Intake/Output:   Intake/Output Summary (Last 24 hours) at 02/20/13 0736 Last data filed at 02/20/13 0700  Gross per 24 hour  Intake 1066.22 ml  Output   1645 ml  Net -578.78 ml     Physical Exam: General: Pleasant, + orthopnea.  Psych: Normal affect.  Neuro: Alert and oriented X 3. Moves all extremities spontaneously.  HEENT: Normal  Neck: Supple without bruits. Neck veins to jaw. Carotids 2+ bilaterally without bruits  Lungs: Resp regular and unlabored, crackles in in bases Heart: RRR no s3, + s4, 2/6 TR  Abdomen: protuberant, firm and distended. nontender, BS + x 4.  Extremities: No clubbing, cyanosis. 3-4+ bilat LEE to thighs. bilat hand edema. DP/PT/Radials 2+ and equal bilaterally.   Telemetry: SR  Labs: Basic Metabolic Panel:  Recent Labs Lab 02/19/13 1429 02/19/13 2200 02/20/13 0500  NA 141  --  143  K 4.5  --  4.3  CL 105  --  107  CO2 28  --  28  GLUCOSE 136*  --  166*  BUN 33*  --  35*  CREATININE 2.02* 2.13* 2.03*  CALCIUM 9.0  --  8.8  MG  --  2.1  --     Liver Function Tests:  Recent Labs Lab  02/20/13 0500  AST 22  ALT 25  ALKPHOS 150*  BILITOT 0.3  PROT 6.1  ALBUMIN 2.3*   No results found for this basename: LIPASE, AMYLASE,  in the last 168 hours No results found for this basename: AMMONIA,  in the last 168 hours  CBC:  Recent Labs Lab 02/19/13 1429 02/19/13 2200  WBC 6.7 7.9  HGB 9.5* 9.4*  HCT 30.4* 29.4*  MCV 84.2 84.2  PLT 223 252    Cardiac Enzymes:  Recent Labs Lab 02/19/13 2205  TROPONINI <0.30    BNP: BNP (last 3 results)  Recent Labs  02/19/13 1429 02/20/13 0500  PROBNP 45515.0* 57495.0*     Other results:   Imaging: Dg Chest 2 View  02/19/2013   *RADIOLOGY REPORT*  Clinical Data: Chest pain.  Short of breath.  Diabetic.  CHF.  CHEST - 2 VIEW  Comparison: None.  Findings: Technically suboptimal examination.  This is due to both portable technique and obese body habitus.  The pannus projects over the inferior chest.  The cardiopericardial silhouette appears enlarged.  Some this may represent pericardial fat pad.  Lung parenchyma is difficult to evaluate.   On the lateral view, there are small bilateral pleural  effusions and thickening of the fissures.  The lateral view suggests a moderate pulmonary edema. Lateral view is actually more diagnostic on the frontal view.  IMPRESSION:  1.  Markedly suboptimal study due to obese habitus and technique. 2.  Overall, the appearance of the chest suggests moderate pulmonary edema and CHF.   Original Report Authenticated By: Andreas Newport, M.D.   Dg Chest Portable 1 View  02/19/2013   *RADIOLOGY REPORT*  Clinical Data: Right IJ line placement.  PORTABLE CHEST - 1 VIEW  Comparison: 02/19/2013  Findings: Right jugular central line has been placed.  The catheter tip is in the region of the lower SVC.  Again noted is marked enlargement of the heart. Prominent interstitial markings could represent interstitial edema or vascular congestion.  Cannot exclude a right pleural effusion.  IMPRESSION: Jugular central  line in the lower SVC region. Negative for a pneumothorax.  Stable cardiomegaly.   Original Report Authenticated By: Richarda Overlie, M.D.      Medications:     Scheduled Medications: . amLODipine  5 mg Oral Daily  . atorvastatin  40 mg Oral Daily  . carvedilol  12.5 mg Oral BID WC  . docusate sodium  100 mg Oral BID  . heparin  5,000 Units Subcutaneous Q8H  . hydrALAZINE  25 mg Oral Q8H  . insulin aspart  0-15 Units Subcutaneous TID WC  . insulin aspart  4 Units Subcutaneous TID WC  . insulin detemir  14 Units Subcutaneous QHS  . pantoprazole  40 mg Oral Daily  . potassium chloride  20 mEq Oral Daily  . sodium chloride  3 mL Intravenous Q12H     Infusions: . furosemide (LASIX) infusion 20 mg/hr (02/19/13 2149)  . nitroGLYCERIN 200 mcg/min (02/20/13 0600)     PRN Medications:  sodium chloride, acetaminophen, ALPRAZolam, hydrALAZINE, ondansetron (ZOFRAN) IV, sodium chloride   Assessment:   1. Acute on chronic diastolic chf with anasarca 2. Hypertensive crisis 3. Pulm HTN:  4. OSA: Cont cpap.  5. DM 6. Probable chronic renal failure:  7. Acute respiratory failure 8. Protein calorie malnutrition (albumin 2.3)   Plan/Discussion:     Very mild response to IV lasix (weight unchanged). Requiring high dose IV NTG but BP still quite high. Will switch IV NTG to nicardipine. Will place subclavian line to permit UF. Avoid PICC as I suspect she may need HD down the road. Renal function slightly improved.  Check echo today.  Length of Stay: 1 Arvilla Meres 02/20/2013, 7:36 AM  Advanced Heart Failure Team Pager [phone] (M-F; 7a - 4p)  Please contact [facility] for night-coverage after hours (4p -7a ) and weekends on [email or web address]

## 2013-02-20 NOTE — Care Management Note (Signed)
    Page 1 of 1   02/20/2013     10:36:47 AM   CARE MANAGEMENT NOTE 02/20/2013  Patient:  MIASIA, CRABTREE   Account Number:  [id number]  Date Initiated:  02/20/2013  Documentation initiated by:  Junius Creamer  Subjective/Objective Assessment:   adm w htn urgency, iv ntg drip     Action/Plan:   lives w husband   Anticipated DC Date:     Anticipated DC Plan:        DC Planning Services  CM consult      Choice offered to / List presented to:             Status of service:   Medicare Important Message given?   (If response is "NO", the following Medicare IM given date fields will be blank) Date Medicare IM given:   Date Additional Medicare IM given:    Discharge Disposition:    Per UR Regulation:  Reviewed for med. necessity/level of care/duration of stay  If discussed at Long Length of Stay Meetings, dates discussed:    Comments:  5/15  1034a Layah Attikus Bartoszek rn,bsn pt in w chf, will moniter for dc needs as pt progresses.

## 2013-02-20 NOTE — Procedures (Signed)
Central Venous Catheter Insertion Procedure Note JERLEAN PERALTA [id number] [birth year]  Procedure: Insertion of Central Venous Catheter Indications: Ultrafiltration   Procedure Details Consent: Risks of procedure as well as the alternatives and risks of each were explained to the (patient/caregiver).  Consent for procedure obtained. Time Out: Verified patient identification, verified procedure, site/side was marked, verified correct patient position, special equipment/implants available, medications/allergies/relevent history reviewed, required imaging and test results available.  Performed  Maximum sterile technique was used including antiseptics, cap, gloves, gown, hand hygiene, mask and sheet. Skin prep: Chlorhexidine; local anesthetic administered A antimicrobial bonded/coated double lumen catheter was placed in the left subclavian vein using the Seldinger technique.  Evaluation Blood flow good Complications: No apparent complications Patient did tolerate procedure well. Chest X-ray ordered to verify placement.  CXR: normal.  Arvilla Meres 02/20/2013, 11:19 AM

## 2013-02-21 DIAGNOSIS — G4733 Obstructive sleep apnea (adult) (pediatric): Secondary | ICD-10-CM

## 2013-02-21 LAB — CBC
HCT: 27.5 % — ABNORMAL LOW (ref 36.0–46.0)
Hemoglobin: 8.4 g/dL — ABNORMAL LOW (ref 12.0–15.0)
Hemoglobin: 8.8 g/dL — ABNORMAL LOW (ref 12.0–15.0)
MCH: 27.3 pg (ref 26.0–34.0)
MCHC: 31.5 g/dL (ref 30.0–36.0)
MCHC: 32 g/dL (ref 30.0–36.0)
MCHC: 32.2 g/dL (ref 30.0–36.0)
MCV: 85.9 fL (ref 78.0–100.0)
MCV: 85.4 fL (ref 78.0–100.0)
Platelets: 212 10*3/uL (ref 150–400)
Platelets: 215 K/uL (ref 150–400)
RBC: 3.03 MIL/uL — ABNORMAL LOW (ref 3.87–5.11)
RBC: 3.22 MIL/uL — ABNORMAL LOW (ref 3.87–5.11)
RDW: 18.4 % — ABNORMAL HIGH (ref 11.5–15.5)
RDW: 18.3 % — ABNORMAL HIGH (ref 11.5–15.5)
WBC: 7.7 10*3/uL (ref 4.0–10.5)
WBC: 7.9 10*3/uL (ref 4.0–10.5)
WBC: 8 K/uL (ref 4.0–10.5)

## 2013-02-21 LAB — GLUCOSE, CAPILLARY
Glucose-Capillary: 70 mg/dL (ref 70–99)
Glucose-Capillary: 125 mg/dL — ABNORMAL HIGH (ref 70–99)

## 2013-02-21 LAB — BASIC METABOLIC PANEL
BUN: 34 mg/dL — ABNORMAL HIGH (ref 6–23)
BUN: 36 mg/dL — ABNORMAL HIGH (ref 6–23)
CO2: 26 mEq/L (ref 19–32)
Calcium: 8.5 mg/dL (ref 8.4–10.5)
Chloride: 102 mEq/L (ref 96–112)
Creatinine, Ser: 2.3 mg/dL — ABNORMAL HIGH (ref 0.50–1.10)
GFR calc Af Amer: 26 mL/min — ABNORMAL LOW (ref >90)
GFR calc Af Amer: 27 mL/min — ABNORMAL LOW (ref >90)
GFR calc non Af Amer: 23 mL/min — ABNORMAL LOW (ref >90)
Potassium: 4 mEq/L (ref 3.5–5.1)
Sodium: 138 mEq/L (ref 135–145)

## 2013-02-21 LAB — HEPARIN LEVEL (UNFRACTIONATED)
Heparin Unfractionated: 0.44 [IU]/mL (ref 0.30–0.70)
Heparin Unfractionated: 0.57 [IU]/mL (ref 0.30–0.70)

## 2013-02-21 LAB — CARBOXYHEMOGLOBIN
Carboxyhemoglobin: 1.3 % (ref 0.5–1.5)
Methemoglobin: 1 % (ref 0.0–1.5)
O2 Saturation: 78.2 %

## 2013-02-21 LAB — BASIC METABOLIC PANEL WITH GFR
BUN: 35 mg/dL — ABNORMAL HIGH (ref 6–23)
CO2: 29 meq/L (ref 19–32)
Calcium: 8.6 mg/dL (ref 8.4–10.5)
Chloride: 105 meq/L (ref 96–112)
Creatinine, Ser: 2.51 mg/dL — ABNORMAL HIGH (ref 0.50–1.10)
GFR calc Af Amer: 24 mL/min — ABNORMAL LOW
GFR calc non Af Amer: 21 mL/min — ABNORMAL LOW
Glucose, Bld: 97 mg/dL (ref 70–99)
Potassium: 4.8 meq/L (ref 3.5–5.1)
Sodium: 141 meq/L (ref 135–145)

## 2013-02-21 MED ORDER — HYDRALAZINE HCL 50 MG PO TABS
50.0000 mg | ORAL_TABLET | Freq: Three times a day (TID) | ORAL | Status: DC
  Administered 2013-02-21 – 2013-02-23 (×6): 50 mg via ORAL
  Filled 2013-02-21 (×9): qty 1

## 2013-02-21 MED ORDER — PROMETHAZINE HCL 25 MG RE SUPP
25.0000 mg | Freq: Three times a day (TID) | RECTAL | Status: DC | PRN
  Administered 2013-02-21 (×2): 25 mg via RECTAL
  Filled 2013-02-21 (×2): qty 1

## 2013-02-21 NOTE — Progress Notes (Signed)
Advanced Heart Failure Rounding Note   Subjective:    57 y/o female with a h/o HTN, DM, diastolic HF and PAH. Admitted 5/14 with HTN crisis (BP 260/120) and massive volume overload.   UOP not very aggressive on lasix gtt at 20 therefore started on UF last night at 200 ml/hr.  Already removed 1.5 L.   Started on nicardipine yesterday with improvement in SBP from 200-> 140-160.  Wearing CPAP.  Some SOB and very edematous.   Objective:    Vital Signs:   Temp:  [97.3 F (36.3 C)-98.4 F (36.9 C)] 97.4 F (36.3 C) (05/16 0722) Pulse Rate:  [60-81] 64 (05/16 0722) Resp:  [0-28] 16 (05/16 0722) BP: (127-197)/(49-130) 149/69 mmHg (05/16 0722) SpO2:  [92 %-100 %] 98 % (05/16 0722) Weight:  [209 lb 10.5 oz (95.1 kg)-211 lb 6.7 oz (95.9 kg)] 209 lb 10.5 oz (95.1 kg) (05/16 0500)    Weight change: Filed Weights   02/20/13 0500 02/20/13 2332 02/21/13 0500  Weight: 211 lb 6.7 oz (95.9 kg) 211 lb 6.7 oz (95.9 kg) 209 lb 10.5 oz (95.1 kg)    Intake/Output:   Intake/Output Summary (Last 24 hours) at 02/21/13 0726 Last data filed at 02/21/13 0700  Gross per 24 hour  Intake 1671.34 ml  Output   3035 ml  Net -1363.66 ml     Physical Exam: General: Pleasant Psych: Normal affect.  Neuro: Alert and oriented X 3. Moves all extremities spontaneously.  HEENT: Normal.  Rt IJ.  Lt subclavian Neck: Supple without bruits. JVP jaw. Carotids 2+ bilaterally without bruits  Lungs: Resp regular and unlabored, crackles in in bases Heart: RRR no s3, + s4, 2/6 TR  Abdomen: protuberant, firm and distended. nontender, BS + x 4.  Extremities: No clubbing, cyanosis. 3-4+ bilat LEE to thighs. bilat hand edema. DP/PT/Radials 2+ and equal bilaterally.   Telemetry: SR  Labs: Basic Metabolic Panel:  Recent Labs Lab 02/19/13 1429 02/19/13 2200 02/20/13 0500 02/21/13 02/21/13 0400  NA 141  --  143 138 141  K 4.5  --  4.3 4.0 4.0  CL 105  --  107 102 105  CO2 28  --  28 29 26   GLUCOSE 136*  --  166*  136* 118*  BUN 33*  --  35* 34* 36*  CREATININE 2.02* 2.13* 2.03* 2.31* 2.30*  CALCIUM 9.0  --  8.8 8.4 8.5  MG  --  2.1  --   --   --     Liver Function Tests:  Recent Labs Lab 02/20/13 0500  AST 22  ALT 25  ALKPHOS 150*  BILITOT 0.3  PROT 6.1  ALBUMIN 2.3*   No results found for this basename: LIPASE, AMYLASE,  in the last 168 hours No results found for this basename: AMMONIA,  in the last 168 hours  CBC:  Recent Labs Lab 02/19/13 1429 02/19/13 2200 02/21/13 02/21/13 0430  WBC 6.7 7.9 7.7 7.9  HGB 9.5* 9.4* 8.4* 8.4*  HCT 30.4* 29.4* 26.1* 26.7*  MCV 84.2 84.2 86.1 85.9  PLT 223 252 194 212    Cardiac Enzymes:  Recent Labs Lab 02/19/13 2205  TROPONINI <0.30    BNP: BNP (last 3 results)  Recent Labs  02/19/13 1429 02/20/13 0500  PROBNP 45515.0* 57495.0*     Other results:   Imaging: Dg Chest 2 View  02/19/2013   *RADIOLOGY REPORT*  Clinical Data: Chest pain.  Short of breath.  Diabetic.  CHF.  CHEST - 2 VIEW  Comparison: None.  Findings: Technically suboptimal examination.  This is due to both portable technique and obese body habitus.  The pannus projects over the inferior chest.  The cardiopericardial silhouette appears enlarged.  Some this may represent pericardial fat pad.  Lung parenchyma is difficult to evaluate.   On the lateral view, there are small bilateral pleural effusions and thickening of the fissures.  The lateral view suggests a moderate pulmonary edema. Lateral view is actually more diagnostic on the frontal view.  IMPRESSION:  1.  Markedly suboptimal study due to obese habitus and technique. 2.  Overall, the appearance of the chest suggests moderate pulmonary edema and CHF.   Original Report Authenticated By: Andreas Newport, M.D.   Dg Chest Port 1 View  02/20/2013   *RADIOLOGY REPORT*  Clinical Data: Line placement  PORTABLE CHEST - 1 VIEW  Comparison: Yesterday  Findings: New left subclavian vein central venous catheter has been  placed with its tip in the lower SVC.  No evidence of pneumothorax. Severe cardiomegaly persists.  No pneumothorax.  Increasing vascular congestion.  Low volumes persist. Stable right internal jugular central venous catheter.  IMPRESSION: New left subclavian vein central venous catheter placement with its tip in the mid SVC and no pneumothorax.  Vascular congestion.   Original Report Authenticated By: Jolaine Click, M.D.   Dg Chest Portable 1 View  02/19/2013   *RADIOLOGY REPORT*  Clinical Data: Right IJ line placement.  PORTABLE CHEST - 1 VIEW  Comparison: 02/19/2013  Findings: Right jugular central line has been placed.  The catheter tip is in the region of the lower SVC.  Again noted is marked enlargement of the heart. Prominent interstitial markings could represent interstitial edema or vascular congestion.  Cannot exclude a right pleural effusion.  IMPRESSION: Jugular central line in the lower SVC region. Negative for a pneumothorax.  Stable cardiomegaly.   Original Report Authenticated By: Richarda Overlie, M.D.     Medications:     Scheduled Medications: . amLODipine  5 mg Oral Daily  . atorvastatin  40 mg Oral Daily  . carvedilol  12.5 mg Oral BID WC  . docusate sodium  100 mg Oral BID  . hydrALAZINE  25 mg Oral Q8H  . insulin aspart  0-15 Units Subcutaneous TID WC  . insulin aspart  4 Units Subcutaneous TID WC  . insulin detemir  14 Units Subcutaneous QHS  . pantoprazole  40 mg Oral Daily  . sodium chloride  3 mL Intravenous Q12H    Infusions: . heparin 1,100 Units/hr (02/20/13 1700)  . niCARDipine 5 mg/hr (02/21/13 0444)  . nitroGLYCERIN Stopped (02/20/13 1400)    PRN Medications: sodium chloride, acetaminophen, ALPRAZolam, hydrALAZINE, ondansetron (ZOFRAN) IV, sodium chloride   Assessment:   1. Acute on chronic diastolic chf with anasarca 2. Hypertensive crisis 3. Pulm HTN:  4. OSA: Cont cpap.  5. DM 6. Probable chronic renal failure:  7. Acute respiratory failure 8.  Protein calorie malnutrition (albumin 2.3) 9. Large pericardial effusion  Plan/Discussion:    She is currently tolerating UF at 200 ml/hr.  Will continue UF at this time.  Watch Hct closely.    BP much improved on nicardipine.  Will increase hydralazine 50 mg TID to try and begin weaning nicardipine.   Length of Stay: 2 Robbi Garter, [facility] 02/21/2013, 7:26 AM  Advanced Heart Failure Team Pager [phone] (M-F; 7a - 4p)  Please contact [facility] for night-coverage after hours (4p -7a ) and weekends on [email or web address]   Patient seen  and examined with Ulyess Blossom, PA-C. We discussed all aspects of the encounter. I agree with the assessment and plan as stated above.   She is improving slowly. Volume status getting better on UF. Will increase rat to 250. I reviewed echo. She has large pericardial effusion without any evidence of tamponade. Hopefully we can diurese this off. Will get f/u echo next week. She also has severe LVH which I suspect is related to her HTN but the question on amyloid has been raised. We have sent SPEP/UPEP. BP improved. Will wean nicardipine. Goal SBP 140-170.   Over the weekend, keep in SDU and continue UF.  Truman Hayward 8:38 AM

## 2013-02-21 NOTE — Progress Notes (Signed)
   ANTICOAGULATION CONSULT NOTE - Follow Up Consult  Pharmacy Consult for Heparin Indication: Ultrafiltration  Allergies  Allergen Reactions  . Clonidine Derivatives     Hand itching  . Hydralazine     Visual disturbances   . Labetalol     Fatigue   . Vancomycin Rash    Patient Measurements: Height: 5' 1.5" (156.2 cm) Weight: 209 lb 10.5 oz (95.1 kg) IBW/kg (Calculated) : 48.95 Heparin Dosing Weight: 71.6kg  Vital Signs: Temp: 97.4 F (36.3 C) (05/16 0722) Temp src: Axillary (05/16 0722) BP: 149/69 mmHg (05/16 0722) Pulse Rate: 64 (05/16 0722)  Labs:  Recent Labs  02/19/13 2200 02/19/13 2205 02/20/13 0500 02/20/13 2100 02/20/13 2149 02/21/13 02/21/13 0400 02/21/13 0430  HGB 9.4*  --   --   --   --  8.4*  --  8.4*  HCT 29.4*  --   --   --   --  26.1*  --  26.7*  PLT 252  --   --   --   --  194  --  212  HEPARINUNFRC  --   --   --  0.35 0.48  --   --  0.44  CREATININE 2.13*  --  2.03*  --   --  2.31* 2.30*  --   TROPONINI  --  <0.30  --   --   --   --   --   --     Estimated Creatinine Clearance: 29.8 ml/min (by C-G formula based on Cr of 2.3).   Medications:  Heparin 1100 units/hr   Assessment: 56yof started on heparin with plans to start ultrafiltration. Heparin level 0.35> 0.48 ( within therapeutic range) last pm and UF started about midnight.   Heparin drip continued at 1100 uts/hr AM HL 0.44. - H/H and Plts stable - No significant bleeding reported   Goal of Therapy:  Heparin level 0.3-0.6 units/ml Monitor platelets by anticoagulation protocol: Yes   Plan:  1. Continue heparin 1100 units/hr (11 ml/hr) 2. Q12 HL, CBC, BMET   Leota Sauers Pharm.D. CPP, BCPS Clinical Pharmacist [phone] 02/21/2013 7:47 AM

## 2013-02-21 NOTE — Progress Notes (Signed)
   ANTICOAGULATION CONSULT NOTE - Follow Up Consult  Pharmacy Consult for Heparin Indication: Ultrafiltration  Allergies  Allergen Reactions  . Clonidine Derivatives     Hand itching  . Hydralazine     Visual disturbances   . Labetalol     Fatigue   . Vancomycin Rash    Patient Measurements: Height: 5' 1.5" (156.2 cm) Weight: 209 lb 10.5 oz (95.1 kg) IBW/kg (Calculated) : 48.95 Heparin Dosing Weight: 71.6kg  Vital Signs: Temp: 97.7 F (36.5 C) (05/16 1600) Temp src: Oral (05/16 1600) BP: 150/60 mmHg (05/16 1800) Pulse Rate: 67 (05/16 1800)  Labs:  Recent Labs  02/19/13 2200 02/19/13 2205  02/20/13 2149 02/21/13 02/21/13 0400 02/21/13 0430 02/21/13 1615  HGB 9.4*  --   --   --  8.4*  --  8.4* 8.8*  HCT 29.4*  --   --   --  26.1*  --  26.7* 27.5*  PLT 252  --   --   --  194  --  212 215  HEPARINUNFRC  --   --   < > 0.48  --   --  0.44 0.57  CREATININE 2.13*  --   < >  --  2.31* 2.30*  --  2.51*  TROPONINI  --  <0.30  --   --   --   --   --   --   < > = values in this interval not displayed.  Estimated Creatinine Clearance: 27.3 ml/min (by C-G formula based on Cr of 2.51).   Medications:  Heparin 1100 units/hr   Assessment: 56yof started on heparin for anticoagulation during ultrafiltration process. Heparin level remains therapeutic on current rate. - H/H and Plts stable - No significant bleeding reported   Goal of Therapy:  Heparin level 0.3-0.6 units/ml Monitor platelets by anticoagulation protocol: Yes   Plan:  1. Continue heparin 1100 units/hr (11 ml/hr) 2. Q12 HL, CBC, BMET  Verlene Mayer, PharmD, [place] Pager [phone] 02/21/2013 7:25 PM

## 2013-02-22 LAB — GLUCOSE, CAPILLARY
Glucose-Capillary: 72 mg/dL (ref 70–99)
Glucose-Capillary: 98 mg/dL (ref 70–99)

## 2013-02-22 LAB — BASIC METABOLIC PANEL
BUN: 37 mg/dL — ABNORMAL HIGH (ref 6–23)
BUN: 40 mg/dL — ABNORMAL HIGH (ref 6–23)
CO2: 30 mEq/L (ref 19–32)
CO2: 25 mEq/L (ref 19–32)
Chloride: 104 mEq/L (ref 96–112)
Chloride: 101 mEq/L (ref 96–112)
Creatinine, Ser: 2.62 mg/dL — ABNORMAL HIGH (ref 0.50–1.10)
GFR calc Af Amer: 21 mL/min — ABNORMAL LOW (ref >90)
Glucose, Bld: 94 mg/dL (ref 70–99)
Glucose, Bld: 148 mg/dL — ABNORMAL HIGH (ref 70–99)
Potassium: 4.2 mEq/L (ref 3.5–5.1)
Potassium: 4.4 mEq/L (ref 3.5–5.1)

## 2013-02-22 LAB — CBC
HCT: 28.2 % — ABNORMAL LOW (ref 36.0–46.0)
HCT: 28.8 % — ABNORMAL LOW (ref 36.0–46.0)
Hemoglobin: 8.9 g/dL — ABNORMAL LOW (ref 12.0–15.0)
Hemoglobin: 9.1 g/dL — ABNORMAL LOW (ref 12.0–15.0)
MCV: 85.5 fL (ref 78.0–100.0)
MCV: 85.5 fL (ref 78.0–100.0)
RBC: 3.3 MIL/uL — ABNORMAL LOW (ref 3.87–5.11)
RBC: 3.37 MIL/uL — ABNORMAL LOW (ref 3.87–5.11)
RDW: 18.2 % — ABNORMAL HIGH (ref 11.5–15.5)
RDW: 18.3 % — ABNORMAL HIGH (ref 11.5–15.5)
WBC: 7.4 10*3/uL (ref 4.0–10.5)
WBC: 7.7 10*3/uL (ref 4.0–10.5)

## 2013-02-22 LAB — CARBOXYHEMOGLOBIN
Methemoglobin: 0.8 % (ref 0.0–1.5)
Total hemoglobin: 9.2 g/dL — ABNORMAL LOW (ref 12.0–16.0)

## 2013-02-22 LAB — POCT I-STAT 3, ART BLOOD GAS (G3+)
Bicarbonate: 28.8 mEq/L — ABNORMAL HIGH (ref 20.0–24.0)
O2 Saturation: 76 %
pCO2 arterial: 50.9 mmHg — ABNORMAL HIGH (ref 35.0–45.0)
pO2, Arterial: 43 mmHg — ABNORMAL LOW (ref 80.0–100.0)

## 2013-02-22 MED ORDER — BIOTENE DRY MOUTH MT LIQD
15.0000 mL | Freq: Two times a day (BID) | OROMUCOSAL | Status: DC
  Administered 2013-02-23 – 2013-02-26 (×5): 15 mL via OROMUCOSAL

## 2013-02-22 MED ORDER — CHLORHEXIDINE GLUCONATE 0.12 % MT SOLN
15.0000 mL | Freq: Two times a day (BID) | OROMUCOSAL | Status: DC
  Administered 2013-02-22 – 2013-02-26 (×7): 15 mL via OROMUCOSAL
  Filled 2013-02-22 (×7): qty 15

## 2013-02-22 NOTE — Progress Notes (Signed)
Patient was placed on c-pap per her request. She was quite sleepy and tired. Soon after RT switched her from nasal cannula to c-pap mask her SpO2 dropped to 80's, masked checked and no problems noted. RT called back  to room. Unable to get o2 up, orders for ABGs obtained. As we changed pt. Back to nasal cannula, RT noted a hole in the tubing. Patient had no further problems with O2 saturation after changing the tubing.  Jorge Ny Blackwell

## 2013-02-22 NOTE — Progress Notes (Signed)
   ANTICOAGULATION CONSULT NOTE - Follow Up Consult  Pharmacy Consult for Heparin Indication: Ultrafiltration  Allergies  Allergen Reactions  . Clonidine Derivatives     Hand itching  . Hydralazine     Visual disturbances   . Labetalol     Fatigue   . Vancomycin Rash    Patient Measurements: Height: 5' 1.5" (156.2 cm) Weight: 200 lb 6.4 oz (90.9 kg) IBW/kg (Calculated) : 48.95 Heparin Dosing Weight: 71.6kg  Vital Signs: Temp: 97.8 F (36.6 C) (05/17 0359) Temp src: Axillary (05/17 0359) BP: 175/89 mmHg (05/17 0600) Pulse Rate: 63 (05/17 0600)  Labs:  Recent Labs  02/19/13 2200 02/19/13 2205  02/21/13 0400 02/21/13 0430 02/21/13 1615 02/22/13 0500  HGB 9.4*  --   < >  --  8.4* 8.8* 8.9*  HCT 29.4*  --   < >  --  26.7* 27.5* 28.2*  PLT 252  --   < >  --  212 215 228  HEPARINUNFRC  --   --   < >  --  0.44 0.57 0.68  CREATININE 2.13*  --   < > 2.30*  --  2.51* 2.62*  TROPONINI  --  <0.30  --   --   --   --   --   < > = values in this interval not displayed.  Estimated Creatinine Clearance: 25.5 ml/min (by C-G formula based on Cr of 2.62).   Medications:  Heparin 1100 units/hr   Assessment: 56yof started on heparin for anticoagulation during ultrafiltration process. Heparin level is now above therapeutic range to 0.68 on current rate. - H/H and Plts stable - No significant bleeding reported   Goal of Therapy:  Heparin level 0.3-0.6 units/ml Monitor platelets by anticoagulation protocol: Yes   Plan:  1. Decrease heparin gtt to 1000 units/hr (10 ml/hr) 2. Q12 HL, CBC, BMET  Thank you for the consult.  Tomi Bamberger, PharmD Clinical Pharmacist Pager: [phone] Pharmacy: [phone] 02/22/2013 6:45 AM

## 2013-02-22 NOTE — Progress Notes (Signed)
Patient Name: Gail Chapman      SUBJECTIVE: admitted with massive volume overload and hypertensive crisis and renal insufficiency. She is thought to have normal coronary arteries with significant left ventricular hypertrophy and carries a diagnosis of pulmonary arterial hypertension although estimated PA pressure was only 45 with an estimated CVP of 20   She has been treated with ultrafiltration for management of fluid.   Echocardiogram demonstrates a significant circumferential pericardial effusion without evidence of Tamponade, and the question of amyloid has been raised. Testing is underway   Notably ECG demonstrates no significant voltage increases although the voltage is not low and somewhat discordant from the moderate-severe LVH and: Biatrial enlargement was noted  kappy and lamba IFE are both elevated.  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 Diabetes mellitus without complication   . Chronic diastolic CHF (congestive heart failure)     a. 01/2007 MV: No isch/infarct, nl EF;  b. 2012 Cath: "normal" per patient.  Performed in [place], [place] by Dr. Graciela Husbands.  . Pulmonary hypertension     a. on home O2 @ 3lpm 24hrs/day  . HTN (hypertension)     a. Dx in 1993.  Marland Kitchen LVH (left ventricular hypertrophy)   . OSA (obstructive sleep apnea)     a. uses CPAP  . Physical deconditioning     PHYSICAL EXAM Filed Vitals:   02/22/13 0600 02/22/13 0730 02/22/13 0800 02/22/13 1000  BP: 175/89  146/64 169/75  Pulse: 63  63 65  Temp:  97.6 F (36.4 C)    TempSrc:  Axillary    Resp: 18  7 16   Height:      Weight:      SpO2: 97%  100% 100%    Clear B JVP>10 1+Edema RRR Awake and oriented  TELEMETRY: Reviewed telemetry pt in nsr :    Intake/Output Summary (Last 24 hours) at 02/22/13 1047 Last data filed at 02/22/13 1000  Gross per 24 hour  Intake 1152.03 ml  Output   6505 ml  Net -5352.97 ml    LABS: Basic Metabolic Panel:  Recent Labs Lab  02/19/13 1429 02/19/13 2200 02/20/13 0500 02/21/13 02/21/13 0400 02/21/13 1615 02/22/13 0500  NA 141  --  143 138 141 141 140  K 4.5  --  4.3 4.0 4.0 4.8 4.2  CL 105  --  107 102 105 105 104  CO2 28  --  28 29 26 29 30   GLUCOSE 136*  --  166* 136* 118* 97 94  BUN 33*  --  35* 34* 36* 35* 37*  CREATININE 2.02* 2.13* 2.03* 2.31* 2.30* 2.51* 2.62*  CALCIUM 9.0  --  8.8 8.4 8.5 8.6 8.5  MG  --  2.1  --   --   --   --   --    Cardiac Enzymes:  Recent Labs  02/19/13 2205  TROPONINI <0.30   CBC:  Recent Labs Lab 02/19/13 1429 02/19/13 2200 02/21/13 02/21/13 0430 02/21/13 1615 02/22/13 0500  WBC 6.7 7.9 7.7 7.9 8.0 7.4  HGB 9.5* 9.4* 8.4* 8.4* 8.8* 8.9*  HCT 30.4* 29.4* 26.1* 26.7* 27.5* 28.2*  MCV 84.2 84.2 86.1 85.9 85.4 85.5  PLT 223 252 194 212 215 228   PROTIME: No results found for this basename: LABPROT, INR,  in the last 72 hours Liver Function Tests:  Recent Labs  02/20/13 0500  AST 22  ALT 25  ALKPHOS 150*  BILITOT 0.3  PROT 6.1  ALBUMIN 2.3*   No results found for this basename: LIPASE, AMYLASE,  in the last 72 hours BNP: BNP (last 3 results)  Recent Labs  02/19/13 1429 02/20/13 0500  PROBNP 45515.0* 57495.0*   D-Dimer: No results found for this basename: DDIMER,  in the last 72 hours Hemoglobin A1C: No results found for this basename: HGBA1C,  in the last 72 hours Fasting Lipid Panel: No results found for this basename: CHOL, HDL, LDLCALC, TRIG, CHOLHDL, LDLDIRECT,  in the last 72 hours Thyroid Function Tests:  Recent Labs  02/19/13 2200  TSH 1.681    ASSESSMENT AND PLAN:  Principal Problem:   Acute on chronic diastolic CHF (congestive heart failure), NYHA class 4 Active Problems:   Physical deconditioning   OSA (obstructive sleep apnea)   HTN (hypertension)   Pulmonary hypertension   Diabetes mellitus without complication   Hypertensive crisis   Chronic renal failure   Acute respiratory failure 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

## 2013-02-23 LAB — BASIC METABOLIC PANEL
BUN: 47 mg/dL — ABNORMAL HIGH (ref 6–23)
CO2: 28 mEq/L (ref 19–32)
Calcium: 8.7 mg/dL (ref 8.4–10.5)
Calcium: 8.8 mg/dL (ref 8.4–10.5)
Creatinine, Ser: 3.02 mg/dL — ABNORMAL HIGH (ref 0.50–1.10)
GFR calc non Af Amer: 15 mL/min — ABNORMAL LOW (ref >90)
Glucose, Bld: 99 mg/dL (ref 70–99)
Glucose, Bld: 168 mg/dL — ABNORMAL HIGH (ref 70–99)

## 2013-02-23 LAB — CBC
HCT: 28.6 % — ABNORMAL LOW (ref 36.0–46.0)
Hemoglobin: 8.6 g/dL — ABNORMAL LOW (ref 12.0–15.0)
Hemoglobin: 9.1 g/dL — ABNORMAL LOW (ref 12.0–15.0)
MCH: 27.4 pg (ref 26.0–34.0)
MCH: 27.1 pg (ref 26.0–34.0)
MCHC: 31.8 g/dL (ref 30.0–36.0)
MCV: 85.4 fL (ref 78.0–100.0)
Platelets: 227 10*3/uL (ref 150–400)
RBC: 3.14 MIL/uL — ABNORMAL LOW (ref 3.87–5.11)

## 2013-02-23 LAB — CARBOXYHEMOGLOBIN: O2 Saturation: 94.8 %

## 2013-02-23 MED ORDER — HYDRALAZINE HCL 50 MG PO TABS
75.0000 mg | ORAL_TABLET | Freq: Three times a day (TID) | ORAL | Status: DC
  Administered 2013-02-23 – 2013-02-26 (×9): 75 mg via ORAL
  Filled 2013-02-23 (×12): qty 1

## 2013-02-23 MED ORDER — CARVEDILOL 25 MG PO TABS
25.0000 mg | ORAL_TABLET | Freq: Two times a day (BID) | ORAL | Status: DC
  Administered 2013-02-23 – 2013-02-26 (×5): 25 mg via ORAL
  Filled 2013-02-23 (×8): qty 1

## 2013-02-23 MED ORDER — ISOSORBIDE MONONITRATE ER 60 MG PO TB24
60.0000 mg | ORAL_TABLET | Freq: Every day | ORAL | Status: DC
  Administered 2013-02-23 – 2013-02-25 (×3): 60 mg via ORAL
  Filled 2013-02-23 (×4): qty 1

## 2013-02-23 NOTE — Progress Notes (Signed)
Patient Name: Gail Chapman      SUBJECTIVE: admitted with massive volume overload and hypertensive crisis and renal insufficiency. She is thought to have normal coronary arteries with significant left ventricular hypertrophy and carries a diagnosis of pulmonary arterial hypertension although estimated PA pressure was only 45 with an estimated CVP of 20   She has been treated with ultrafiltration for management of fluid.   Echocardiogram demonstrates a significant circumferential pericardial effusion without evidence of Tamponade, and the question of amyloid has been raised. Testing is underway   Notably ECG demonstrates no significant voltage increases although the voltage is not low and somewhat discordant from the moderate-severe LVH and: Biatrial enlargement was noted  kappy and lamba IFE are both elevated.  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 Diabetes mellitus without complication   . Chronic diastolic CHF (congestive heart failure)     a. 01/2007 MV: No isch/infarct, nl EF;  b. 2012 Cath: "normal" per patient.  Performed in [place], [place] by Dr. Graciela Husbands.  . Pulmonary hypertension     a. on home O2 @ 3lpm 24hrs/day  . HTN (hypertension)     a. Dx in 1993.  Marland Kitchen LVH (left ventricular hypertrophy)   . OSA (obstructive sleep apnea)     a. uses CPAP  . Physical deconditioning     PHYSICAL EXAM Filed Vitals:   02/23/13 0400 02/23/13 0600 02/23/13 0759 02/23/13 0800  BP: 173/76 161/64 178/65 178/65  Pulse: 69 66 73 73  Temp: 97.3 F (36.3 C)  98.7 F (37.1 C)   TempSrc: Axillary  Oral   Resp:  0 17 22  Height:      Weight: 194 lb 10.7 oz (88.3 kg)     SpO2: 99% 99% 100% 100%    Well developed and nourished in no acute distress HENT normal Neck supple with JVP>> Clear Regular rate and rhythm, 3/6 sys mur  Abd-soft with active BS No Clubbing cyanosis 3+ edma of thighs Skin-warm and dry A & Oriented   Grossly normal sensory and motor function  TELEMETRY: Reviewed telemetry pt in nsr :    Intake/Output Summary (Last 24 hours) at 02/23/13 1020 Last data filed at 02/23/13 0900  Gross per 24 hour  Intake    770 ml  Output   3455 ml  Net  -2685 ml    LABS: Basic Metabolic Panel:  Recent Labs Lab 02/19/13 1429 02/19/13 2200 02/20/13 0500 02/21/13 02/21/13 0400 02/21/13 1615 02/22/13 0500 02/22/13 1700 02/23/13 0510  NA 141  --  143 138 141 141 140 138 138  K 4.5  --  4.3 4.0 4.0 4.8 4.2 4.4 4.6  CL 105  --  107 102 105 105 104 101 102  CO2 28  --  28 29 26 29 30 25 28   GLUCOSE 136*  --  166* 136* 118* 97 94 148* 99  BUN 33*  --  35* 34* 36* 35* 37* 40* 43*  CREATININE 2.02* 2.13* 2.03* 2.31* 2.30* 2.51* 2.62* 2.75* 3.02*  CALCIUM 9.0  --  8.8 8.4 8.5 8.6 8.5 8.6 8.7  MG  --  2.1  --   --   --   --   --   --   --    Cardiac Enzymes: No results found for this basename: CKTOTAL, CKMB, CKMBINDEX, TROPONINI,  in the last 72 hours CBC:  Recent Labs Lab 02/19/13 2200 02/21/13 02/21/13  0430 02/21/13 1615 02/22/13 0500 02/22/13 1700 02/23/13 0510  WBC 7.9 7.7 7.9 8.0 7.4 7.7 7.2  HGB 9.4* 8.4* 8.4* 8.8* 8.9* 9.1* 8.6*  HCT 29.4* 26.1* 26.7* 27.5* 28.2* 28.8* 26.8*  MCV 84.2 86.1 85.9 85.4 85.5 85.5 85.4  PLT 252 194 212 215 228 243 227   PROTIME: No results found for this basename: LABPROT, INR,  in the last 72 hours Liver Function Tests: No results found for this basename: AST, ALT, ALKPHOS, BILITOT, PROT, ALBUMIN,  in the last 72 hours No results found for this basename: LIPASE, AMYLASE,  in the last 72 hours BNP: BNP (last 3 results)  Recent Labs  02/19/13 1429 02/20/13 0500  PROBNP 45515.0* 57495.0*    ASSESSMENT AND PLAN:  Principal Problem:   Acute on chronic diastolic CHF (congestive heart failure), NYHA class 4 Active Problems:   Physical deconditioning   OSA (obstructive sleep apnea)   HTN (hypertension)   Pulmonary hypertension   Diabetes  mellitus without complication   Hypertensive crisis   Chronic renal failure   Acute respiratory failure 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

## 2013-02-23 NOTE — Progress Notes (Signed)
   ANTICOAGULATION CONSULT NOTE - Follow Up Consult  Pharmacy Consult for Heparin Indication: Ultrafiltration  Allergies  Allergen Reactions  . Clonidine Derivatives     Hand itching  . Hydralazine     Visual disturbances   . Labetalol     Fatigue   . Vancomycin Rash    Patient Measurements: Height: 5' 1.5" (156.2 cm) Weight: 194 lb 10.7 oz (88.3 kg) IBW/kg (Calculated) : 48.95 Heparin Dosing Weight: 71.6kg  Vital Signs: Temp: 98.7 F (37.1 C) (05/18 0759) Temp src: Oral (05/18 0759) BP: 178/65 mmHg (05/18 0800) Pulse Rate: 73 (05/18 0800)  Labs:  Recent Labs  02/22/13 0500 02/22/13 1700 02/23/13 0510  HGB 8.9* 9.1* 8.6*  HCT 28.2* 28.8* 26.8*  PLT 228 243 227  HEPARINUNFRC 0.68 0.46 0.44  CREATININE 2.62* 2.75* 3.02*    Estimated Creatinine Clearance: 21.8 ml/min (by C-G formula based on Cr of 3.02).   Medications:  Heparin 1000 units/hr   Assessment: 56yof started on heparin for anticoagulation during ultrafiltration process. Heparin level within therapeutic range at 0.44 on current rate. - H/H and Plts low, stable - No significant bleeding reported   Goal of Therapy:  Heparin level 0.3-0.6 units/ml Monitor platelets by anticoagulation protocol: Yes   Plan:  1. Continue heparin gtt at 1000 units/hr (10 ml/hr) 2. Q12 HL, CBC, BMET  Thank you for the consult.  Tomi Bamberger, PharmD Clinical Pharmacist Pager: [phone] Pharmacy: [phone] 02/23/2013 8:50 AM

## 2013-02-23 NOTE — Progress Notes (Signed)
   ANTICOAGULATION CONSULT NOTE - Follow Up Consult  Pharmacy Consult for Heparin Indication: Ultrafiltration  Allergies  Allergen Reactions  . Clonidine Derivatives     Hand itching  . Hydralazine     Visual disturbances   . Labetalol     Fatigue   . Vancomycin Rash    Patient Measurements: Height: 5' 1.5" (156.2 cm) Weight: 194 lb 10.7 oz (88.3 kg) IBW/kg (Calculated) : 48.95 Heparin Dosing Weight: 71.6kg  Vital Signs: Temp: 97.4 F (36.3 C) (05/18 2000) Temp src: Oral (05/18 2000) BP: 187/68 mmHg (05/18 2000) Pulse Rate: 69 (05/18 1659)  Labs:  Recent Labs  02/22/13 0500 02/22/13 1700 02/23/13 0510 02/23/13 1935  HGB 8.9* 9.1* 8.6* 9.1*  HCT 28.2* 28.8* 26.8* 28.6*  PLT 228 243 227 249  HEPARINUNFRC 0.68 0.46 0.44 0.48  CREATININE 2.62* 2.75* 3.02*  --     Estimated Creatinine Clearance: 21.8 ml/min (by C-G formula based on Cr of 3.02).   Medications:  Heparin 1000 units/hr   Assessment: 56yof started on heparin for anticoagulation during ultrafiltration process. Heparin level within therapeutic range at 0.48 on current rate.  Goal of Therapy:  Heparin level 0.3-0.6 units/ml Monitor platelets by anticoagulation protocol: Yes   Plan:  1. Continue heparin gtt at 1000 units/hr (10 ml/hr) 2. Q12 HL, CBC, BMET  Thank you for the consult.  Talbert Cage, PharmD Clinical Pharmacist Pager: [phone] Pharmacy: [phone] 02/23/2013 8:21 PM

## 2013-02-23 NOTE — Progress Notes (Signed)
30 min discussion with pt and her husband to try and explain current understanding Points made HFpEF>>"normal heart"  But diastolic dysfunction and fluid retention Possible amyloid--will prob need biospy Renal insufficiency--aggravated by fluid withdrawal and perhaps hypotensionwith BP last pm of 130s "am I going to die?" HFpEF is serious condition and amyloid is a serious condition for which some therpaies exist and yes both can have a negative impact on survival Both she and husband expressed gratitude for the explanation

## 2013-02-23 NOTE — Progress Notes (Signed)
Called to patients room due to blood noticed on pillow and her neck. Upon further inspection there was a hematoma noted to the right side of her neck and her central line was completely out. Removed stitches and line with catheter intact and held pressure to site. Placed vaseline dressing with gauze and secured with tegaderm to site. Informed primary nurse Joy RN and had her look at site. Also patient has some significant swelling to her arms and hands. Due to swelling, removed all of patients rings and 2 bracelets with spouse at bedside. The jewelry was given to her spouse and instructed him to take them home. There are two bracelets that are a little tight that were not able to be removed due to the clasps being stuck. Her spouse stated that if they get too tight we can cut them off and he would get them repaired. We will leave them in place at this time and continue to monitor them.

## 2013-02-23 NOTE — Progress Notes (Signed)
Called Lucile Crater, PAC to notify of pts central line removal, accidentally per the patient.  She states the MD will call with instructions if needed.

## 2013-02-24 ENCOUNTER — Encounter (HOSPITAL_COMMUNITY): Payer: Self-pay | Admitting: Radiology

## 2013-02-24 ENCOUNTER — Inpatient Hospital Stay (HOSPITAL_COMMUNITY): Payer: BC Managed Care – PPO

## 2013-02-24 DIAGNOSIS — I319 Disease of pericardium, unspecified: Secondary | ICD-10-CM

## 2013-02-24 DIAGNOSIS — N179 Acute kidney failure, unspecified: Secondary | ICD-10-CM

## 2013-02-24 DIAGNOSIS — I2789 Other specified pulmonary heart diseases: Secondary | ICD-10-CM

## 2013-02-24 LAB — CBC
HCT: 26.9 % — ABNORMAL LOW (ref 36.0–46.0)
Hemoglobin: 8.6 g/dL — ABNORMAL LOW (ref 12.0–15.0)
MCH: 27.4 pg (ref 26.0–34.0)
MCHC: 32 g/dL (ref 30.0–36.0)

## 2013-02-24 LAB — PROTEIN ELECTROPH W RFLX QUANT IMMUNOGLOBULINS
Albumin ELP: 41.5 % — ABNORMAL LOW (ref 55.8–66.1)
Alpha-1-Globulin: 8.3 % — ABNORMAL HIGH (ref 2.9–4.9)
Alpha-2-Globulin: 14 % — ABNORMAL HIGH (ref 7.1–11.8)
Beta 2: 8.4 % — ABNORMAL HIGH (ref 3.2–6.5)
Beta Globulin: 7.5 % — ABNORMAL HIGH (ref 4.7–7.2)
Gamma Globulin: 20.3 % — ABNORMAL HIGH (ref 11.1–18.8)

## 2013-02-24 LAB — GLUCOSE, CAPILLARY
Glucose-Capillary: 172 mg/dL — ABNORMAL HIGH (ref 70–99)
Glucose-Capillary: 115 mg/dL — ABNORMAL HIGH (ref 70–99)

## 2013-02-24 LAB — PROTIME-INR
INR: 1.07 (ref 0.00–1.49)
Prothrombin Time: 13.8 seconds (ref 11.6–15.2)

## 2013-02-24 LAB — BASIC METABOLIC PANEL
BUN: 51 mg/dL — ABNORMAL HIGH (ref 6–23)
GFR calc non Af Amer: 14 mL/min — ABNORMAL LOW (ref >90)
Glucose, Bld: 180 mg/dL — ABNORMAL HIGH (ref 70–99)
Potassium: 4.8 mEq/L (ref 3.5–5.1)

## 2013-02-24 LAB — CARBOXYHEMOGLOBIN: O2 Saturation: 82.3 %

## 2013-02-24 LAB — UIFE/LIGHT CHAINS/TP QN, 24-HR UR
Alpha 1, Urine: DETECTED — AB
Alpha 2, Urine: DETECTED — AB
Free Kappa Lt Chains,Ur: 3.9 mg/dL — ABNORMAL HIGH (ref 0.14–2.42)
Gamma Globulin, Urine: DETECTED — AB
Total Protein, Urine: 73.1 mg/dL

## 2013-02-24 LAB — IMMUNOFIXATION ADD-ON

## 2013-02-24 MED ORDER — HEPARIN SODIUM (PORCINE) 5000 UNIT/ML IJ SOLN
5000.0000 [IU] | Freq: Three times a day (TID) | INTRAMUSCULAR | Status: DC
  Administered 2013-02-24 – 2013-02-26 (×4): 5000 [IU] via SUBCUTANEOUS
  Filled 2013-02-24 (×10): qty 1

## 2013-02-24 MED ORDER — SODIUM CHLORIDE 0.9 % IJ SOLN
3.0000 mL | Freq: Two times a day (BID) | INTRAMUSCULAR | Status: DC
  Administered 2013-02-24: 3 mL via INTRAVENOUS

## 2013-02-24 MED ORDER — MIDAZOLAM HCL 2 MG/2ML IJ SOLN
INTRAMUSCULAR | Status: AC
  Filled 2013-02-24: qty 4

## 2013-02-24 MED ORDER — HEPARIN SODIUM (PORCINE) 5000 UNIT/ML IJ SOLN
5000.0000 [IU] | Freq: Three times a day (TID) | INTRAMUSCULAR | Status: DC
  Filled 2013-02-24 (×4): qty 1

## 2013-02-24 MED ORDER — FENTANYL CITRATE 0.05 MG/ML IJ SOLN
INTRAMUSCULAR | Status: AC | PRN
  Administered 2013-02-24: 25 ug via INTRAVENOUS

## 2013-02-24 MED ORDER — SODIUM CHLORIDE 0.9 % IV SOLN: 250.0000 mL | INTRAVENOUS | Status: DC | PRN

## 2013-02-24 MED ORDER — SODIUM CHLORIDE 0.9 % IJ SOLN: 3.0000 mL | INTRAMUSCULAR | Status: DC | PRN

## 2013-02-24 MED ORDER — ASPIRIN 81 MG PO CHEW: 324.0000 mg | CHEWABLE_TABLET | ORAL | Status: DC

## 2013-02-24 MED ORDER — MIDAZOLAM HCL 2 MG/2ML IJ SOLN
INTRAMUSCULAR | Status: AC | PRN
  Administered 2013-02-24: 1 mg via INTRAVENOUS

## 2013-02-24 MED ORDER — PANTOPRAZOLE SODIUM 40 MG PO TBEC
40.0000 mg | DELAYED_RELEASE_TABLET | Freq: Every day | ORAL | Status: DC
  Administered 2013-02-25: 40 mg via ORAL
  Filled 2013-02-24: qty 1

## 2013-02-24 MED ORDER — SODIUM CHLORIDE 0.9 % IJ SOLN: 3.0000 mL | Freq: Two times a day (BID) | INTRAMUSCULAR | Status: DC

## 2013-02-24 MED ORDER — HEPARIN BOLUS VIA INFUSION
1000.0000 [IU] | Freq: Once | INTRAVENOUS | Status: AC
  Administered 2013-02-24: 1000 [IU] via INTRAVENOUS
  Filled 2013-02-24: qty 1000

## 2013-02-24 MED ORDER — FENTANYL CITRATE 0.05 MG/ML IJ SOLN
INTRAMUSCULAR | Status: AC
  Filled 2013-02-24: qty 4

## 2013-02-24 NOTE — Procedures (Signed)
CT core biopsy RLQ No complication No blood loss. See complete dictation in [facility].

## 2013-02-24 NOTE — Progress Notes (Signed)
ANTICOAGULATION CONSULT NOTE - Follow Up Consult  Pharmacy Consult for Heparin Indication: Ultrafiltration  Allergies  Allergen Reactions  . Clonidine Derivatives     Hand itching  . Hydralazine     Visual disturbances   . Labetalol     Fatigue   . Vancomycin Rash   Patient Measurements: Height: 5' 1.5" (156.2 cm) Weight: 191 lb 9.3 oz (86.9 kg) IBW/kg (Calculated) : 48.95 Heparin Dosing Weight: 71.6kg  Vital Signs: Temp: 97.7 F (36.5 C) (05/19 0400) Temp src: Axillary (05/19 0400) BP: 163/60 mmHg (05/19 0400) Pulse Rate: 68 (05/18 2338)  Labs:  Recent Labs  02/22/13 1700 02/23/13 0510 02/23/13 1935 02/24/13 0538  HGB 9.1* 8.6* 9.1* 8.6*  HCT 28.8* 26.8* 28.6* 26.9*  PLT 243 227 249 223  HEPARINUNFRC 0.46 0.44 0.48 0.23*  CREATININE 2.75* 3.02* 3.26*  --     Estimated Creatinine Clearance: 20 ml/min (by C-G formula based on Cr of 3.26).  Medications:  Heparin 1000 units/hr   Assessment: Gail Chapman started on heparin for anticoagulation during ultrafiltration process. Heparin level (0.23) is below-goal on 1000 units/hr. No problem with line / infusion and no bleeding at this time per RN.   Goal of Therapy:  Heparin level 0.3-0.6 units/ml Monitor platelets by anticoagulation protocol: Yes   Plan:  1. Heparin IV bolus 1000 units, then increase IV infusion to 1200 units/hr.  2. Heparin level in  6 hours.   Lorre Munroe, PharmD  02/24/2013 6:29 AM

## 2013-02-24 NOTE — Progress Notes (Signed)
Advanced Heart Failure Rounding Note   Subjective:    56 y/o female with a h/o HTN, DM, diastolic HF and PAH. Admitted 5/14 with HTN crisis (BP 260/120) and massive volume overload.   Over the weekend she continued on UF. UF decreased to progressive renal failure. Cr now up to 3.3 Pulled central line out last night by accident. Breathing much better. No further orthopnea  Urine IFE with light chains concerning for amyloid. Yesterday hydralazine,  Imdur, and carvedilol titrated up due to hypertension. Overall weight down 22 pounds. CVP was still 21 yesterday  Creatinine 2.07>3.02>3.26>3.38    Objective:    Vital Signs:   Temp:  [97.4 F (36.3 C)-98.7 F (37.1 C)] 98.5 F (36.9 C) (05/19 0742) Pulse Rate:  [67-73] 68 (05/18 2338) Resp:  [12-23] 22 (05/19 0742) BP: (162-189)/(58-68) 189/66 mmHg (05/19 0742) SpO2:  [99 %-100 %] 99 % (05/19 0742) Weight:  [86.9 kg (191 lb 9.3 oz)] 86.9 kg (191 lb 9.3 oz) (05/19 0400)    Weight change: Filed Weights   02/22/13 0500 02/23/13 0400 02/24/13 0400  Weight: 90.9 kg (200 lb 6.4 oz) 88.3 kg (194 lb 10.7 oz) 86.9 kg (191 lb 9.3 oz)    Intake/Output:   Intake/Output Summary (Last 24 hours) at 02/24/13 0752 Last data filed at 02/24/13 0700  Gross per 24 hour  Intake 1250.5 ml  Output   2785 ml  Net -1534.5 ml     Physical Exam: General: Pleasant Psych: Normal affect.  Neuro: Alert and oriented X 3. Moves all extremities spontaneously.  HEENT: Normal.  Rt IJ area small hematoma.  Lt subclavian UF catheter Neck: Supple without bruits. JVP jaw. Carotids 2+ bilaterally without bruits  Lungs: Resp regular and unlabored, crackles in in bases Heart: RRR no s3, + s4, 2/6 TR  Abdomen: protuberant, firm and distended. nontender, BS + x 4.  Extremities: No clubbing, cyanosis. 2-3+ bilat LEE   Telemetry: SR 60-70s  Labs: Basic Metabolic Panel:  Recent Labs Lab 02/19/13 1429 02/19/13 2200  02/22/13 0500 02/22/13 1700  02/23/13 0510 02/23/13 1935 02/24/13 0538  NA 141  --   < > 140 138 138 135 138  K 4.5  --   < > 4.2 4.4 4.6 4.7 4.8  CL 105  --   < > 104 101 102 100 103  CO2 28  --   < > 30 25 28 27 26  GLUCOSE 136*  --   < > 94 148* 99 168* 180*  BUN 33*  --   < > 37* 40* 43* 47* 51*  CREATININE 2.02* 2.13*  < > 2.62* 2.75* 3.02* 3.26* 3.38*  CALCIUM 9.0  --   < > 8.5 8.6 8.7 8.8 8.7  MG  --  2.1  --   --   --   --   --   --   < > = values in this interval not displayed.  Liver Function Tests:  Recent Labs Lab 02/20/13 0500  AST 22  ALT 25  ALKPHOS 150*  BILITOT 0.3  PROT 6.1  ALBUMIN 2.3*   No results found for this basename: LIPASE, AMYLASE,  in the last 168 hours No results found for this basename: AMMONIA,  in the last 168 hours  CBC:  Recent Labs Lab 02/22/13 0500 02/22/13 1700 02/23/13 0510 02/23/13 1935 02/24/13 0538  WBC 7.4 7.7 7.2 9.1 7.8  HGB 8.9* 9.1* 8.6* 9.1* 8.6*  HCT 28.2* 28.8* 26.8* 28.6* 26.9*  MCV 85.5   85.5 85.4 85.1 85.7  PLT 228 243 227 249 223    Cardiac Enzymes:  Recent Labs Lab 02/19/13 2205  TROPONINI <0.30    BNP: BNP (last 3 results)  Recent Labs  02/19/13 1429 02/20/13 0500  PROBNP 45515.0* 57495.0*     Other results:   Imaging: No results found.   Medications:     Scheduled Medications: . antiseptic oral rinse  15 mL Mouth Rinse q12n4p  . atorvastatin  40 mg Oral Daily  . carvedilol  25 mg Oral BID WC  . chlorhexidine  15 mL Mouth Rinse BID  . docusate sodium  100 mg Oral BID  . hydrALAZINE  75 mg Oral Q8H  . insulin aspart  0-15 Units Subcutaneous TID WC  . insulin aspart  4 Units Subcutaneous TID WC  . insulin detemir  14 Units Subcutaneous QHS  . isosorbide mononitrate  60 mg Oral Daily  . pantoprazole  40 mg Oral Daily  . sodium chloride  3 mL Intravenous Q12H    Infusions: . heparin 1,200 Units/hr (02/24/13 0645)  . niCARDipine Stopped (02/21/13 1453)  . nitroGLYCERIN Stopped (02/20/13 1400)    PRN  Medications: sodium chloride, acetaminophen, hydrALAZINE, ondansetron (ZOFRAN) IV, promethazine, sodium chloride   Assessment:   1. Acute on chronic diastolic chf with anasarca 2. Hypertensive crisis 3. Pulm HTN:  4. OSA: Cont cpap.  5. DM 6. Acute/chronic renal failure:  (baseline unclear) 7. Acute respiratory failure 8. Protein calorie malnutrition (albumin 2.3) 9. Large pericardial effusion  Plan/Discussion:    Remains very tenuous. Still with marked volume overload but renal function getting steadily worse despite persistently elevated CVP. Suspect she has restriction +/- constriction with large effusion. Stop UF. Repeat limited echo today to evaluate effusion. Amyloid remains high on the differential although HTN could explain LVH.  Plan for today: 1) Stop UF 2) Check limited echo to see if effusion is improving 3) Renal u/s 4) Fat pad biopsy 5) Attempt to get records from [place] to see baseline CR  Will not be aggressive with BP management for now. May need RHC tomorrow. Co-ox was ok yesterday,   Dmya Long,MD 8:08 AM Advanced Heart Failure Team Pager [phone] (M-F; 7a - 4p)  Please contact [facility] for night-coverage after hours (4p -7a ) and weekends on [email or web address]      

## 2013-02-24 NOTE — H&P (Signed)
Gail Chapman is an 57 y.o. female.   Chief Complaint: CHF- anasarca; LVH Creatinine worsening HTN Protein Calorie Malnutrition- albumin 2.4 Request made for abd fat pad biopsy to r/o amyloid HPI: DM; CHF; HTN; LVH; sleep apnea  Past Medical History  Diagnosis Date  . Diabetes mellitus without complication   . Chronic diastolic CHF (congestive heart failure)     a. 01/2007 MV: No isch/infarct, nl EF;  b. 2012 Cath: "normal" per patient.  Performed in [place], [place] by Dr. Graciela Husbands.  . Pulmonary hypertension     a. on home O2 @ 3lpm 24hrs/day  . HTN (hypertension)     a. Dx in 1993.  Marland Kitchen LVH (left ventricular hypertrophy)   . OSA (obstructive sleep apnea)     a. uses CPAP  . Physical deconditioning     Past Surgical History  Procedure Laterality Date  . Knee surgery    . Carpal tunnel release    . Tubal ligation    . Cholecystectomy    . Mass excision      a. under axilla.    Family History  Problem Relation Age of Onset  . Diabetes Mother   . CAD Mother    Social History:  reports that she has quit smoking. She does not have any smokeless tobacco history on file. She reports that she does not drink alcohol or use illicit drugs.  Allergies:  Allergies  Allergen Reactions  . Clonidine Derivatives     Hand itching  . Labetalol     Fatigue   . Vancomycin Rash    Medications Prior to Admission  Medication Sig Dispense Refill  . atorvastatin (LIPITOR) 40 MG tablet Take 40 mg by mouth daily.      Marland Kitchen docusate sodium (COLACE) 100 MG capsule Take 100 mg by mouth 2 (two) times daily.      . furosemide (LASIX) 40 MG tablet Take 40 mg by mouth daily.      . insulin aspart (NOVOLOG) 100 UNIT/ML injection Inject 4 Units into the skin 3 (three) times daily with meals. Per sliding scale      . insulin detemir (LEVEMIR) 100 UNIT/ML injection Inject 14 Units into the skin at bedtime. Hold if CBG<150      . labetalol (NORMODYNE) 200 MG tablet Take 200 mg by mouth 3 (three) times  daily.      Marland Kitchen NIFEdipine (PROCARDIA-XL/ADALAT-CC/NIFEDICAL-XL) 30 MG 24 hr tablet Take 60 mg by mouth daily.      . pantoprazole (PROTONIX) 40 MG tablet Take 40 mg by mouth daily.      . promethazine (PHENERGAN) 25 MG suppository Place 25 mg rectally at bedtime.        Results for orders placed during the hospital encounter of 02/19/13 (from the past 48 hour(s))  GLUCOSE, CAPILLARY     Status: None   Collection Time    02/22/13 11:40 AM      Result Value Range   Glucose-Capillary 72  70 - 99 mg/dL  GLUCOSE, CAPILLARY     Status: None   Collection Time    02/22/13  3:37 PM      Result Value Range   Glucose-Capillary 98  70 - 99 mg/dL   Comment 1 Notify RN    CBC     Status: Abnormal   Collection Time    02/22/13  5:00 PM      Result Value Range   WBC 7.7  4.0 - 10.5 K/uL   RBC 3.37 (*)  3.87 - 5.11 MIL/uL   Hemoglobin 9.1 (*) 12.0 - 15.0 g/dL   HCT 16.1 (*) 09.6 - 04.5 %   MCV 85.5  78.0 - 100.0 fL   MCH 27.0  26.0 - 34.0 pg   MCHC 31.6  30.0 - 36.0 g/dL   RDW 40.9 (*) 81.1 - 91.4 %   Platelets 243  150 - 400 K/uL  BASIC METABOLIC PANEL     Status: Abnormal   Collection Time    02/22/13  5:00 PM      Result Value Range   Sodium 138  135 - 145 mEq/L   Potassium 4.4  3.5 - 5.1 mEq/L   Chloride 101  96 - 112 mEq/L   CO2 25  19 - 32 mEq/L   Glucose, Bld 148 (*) 70 - 99 mg/dL   BUN 40 (*) 6 - 23 mg/dL   Creatinine, Ser 7.82 (*) 0.50 - 1.10 mg/dL   Calcium 8.6  8.4 - 95.6 mg/dL   GFR calc non Af Amer 18 (*) >90 mL/min   GFR calc Af Amer 21 (*) >90 mL/min   Comment:            The eGFR has been calculated     using the CKD EPI equation.     This calculation has not been     validated in all clinical     situations.     eGFR's persistently     <90 mL/min signify     possible Chronic Kidney Disease.  HEPARIN LEVEL (UNFRACTIONATED)     Status: None   Collection Time    02/22/13  5:00 PM      Result Value Range   Heparin Unfractionated 0.46  0.30 - 0.70 IU/mL   Comment:             IF HEPARIN RESULTS ARE BELOW     EXPECTED VALUES, AND PATIENT     DOSAGE HAS BEEN CONFIRMED,     SUGGEST FOLLOW UP TESTING     OF ANTITHROMBIN III LEVELS.  GLUCOSE, CAPILLARY     Status: Abnormal   Collection Time    02/22/13  9:06 PM      Result Value Range   Glucose-Capillary 138 (*) 70 - 99 mg/dL  CBC     Status: Abnormal   Collection Time    02/23/13  5:10 AM      Result Value Range   WBC 7.2  4.0 - 10.5 K/uL   RBC 3.14 (*) 3.87 - 5.11 MIL/uL   Hemoglobin 8.6 (*) 12.0 - 15.0 g/dL   HCT 21.3 (*) 08.6 - 57.8 %   MCV 85.4  78.0 - 100.0 fL   MCH 27.4  26.0 - 34.0 pg   MCHC 32.1  30.0 - 36.0 g/dL   RDW 46.9 (*) 62.9 - 52.8 %   Platelets 227  150 - 400 K/uL  BASIC METABOLIC PANEL     Status: Abnormal   Collection Time    02/23/13  5:10 AM      Result Value Range   Sodium 138  135 - 145 mEq/L   Potassium 4.6  3.5 - 5.1 mEq/L   Chloride 102  96 - 112 mEq/L   CO2 28  19 - 32 mEq/L   Glucose, Bld 99  70 - 99 mg/dL   BUN 43 (*) 6 - 23 mg/dL   Creatinine, Ser 4.13 (*) 0.50 - 1.10 mg/dL   Calcium 8.7  8.4 - 24.4  mg/dL   GFR calc non Af Amer 16 (*) >90 mL/min   GFR calc Af Amer 19 (*) >90 mL/min   Comment:            The eGFR has been calculated     using the CKD EPI equation.     This calculation has not been     validated in all clinical     situations.     eGFR's persistently     <90 mL/min signify     possible Chronic Kidney Disease.  HEPARIN LEVEL (UNFRACTIONATED)     Status: None   Collection Time    02/23/13  5:10 AM      Result Value Range   Heparin Unfractionated 0.44  0.30 - 0.70 IU/mL   Comment:            IF HEPARIN RESULTS ARE BELOW     EXPECTED VALUES, AND PATIENT     DOSAGE HAS BEEN CONFIRMED,     SUGGEST FOLLOW UP TESTING     OF ANTITHROMBIN III LEVELS.  CARBOXYHEMOGLOBIN     Status: Abnormal   Collection Time    02/23/13  5:15 AM      Result Value Range   Total hemoglobin 8.7 (*) 12.0 - 16.0 g/dL   O2 Saturation 16.1      Carboxyhemoglobin 1.8 (*) 0.5 - 1.5 %   Methemoglobin 1.3  0.0 - 1.5 %  GLUCOSE, CAPILLARY     Status: None   Collection Time    02/23/13  8:01 AM      Result Value Range   Glucose-Capillary 97  70 - 99 mg/dL  GLUCOSE, CAPILLARY     Status: Abnormal   Collection Time    02/23/13 11:25 AM      Result Value Range   Glucose-Capillary 143 (*) 70 - 99 mg/dL  GLUCOSE, CAPILLARY     Status: Abnormal   Collection Time    02/23/13  5:03 PM      Result Value Range   Glucose-Capillary 142 (*) 70 - 99 mg/dL  CBC     Status: Abnormal   Collection Time    02/23/13  7:35 PM      Result Value Range   WBC 9.1  4.0 - 10.5 K/uL   RBC 3.36 (*) 3.87 - 5.11 MIL/uL   Hemoglobin 9.1 (*) 12.0 - 15.0 g/dL   HCT 09.6 (*) 04.5 - 40.9 %   MCV 85.1  78.0 - 100.0 fL   MCH 27.1  26.0 - 34.0 pg   MCHC 31.8  30.0 - 36.0 g/dL   RDW 81.1 (*) 91.4 - 78.2 %   Platelets 249  150 - 400 K/uL  BASIC METABOLIC PANEL     Status: Abnormal   Collection Time    02/23/13  7:35 PM      Result Value Range   Sodium 135  135 - 145 mEq/L   Potassium 4.7  3.5 - 5.1 mEq/L   Chloride 100  96 - 112 mEq/L   CO2 27  19 - 32 mEq/L   Glucose, Bld 168 (*) 70 - 99 mg/dL   BUN 47 (*) 6 - 23 mg/dL   Creatinine, Ser 9.56 (*) 0.50 - 1.10 mg/dL   Calcium 8.8  8.4 - 21.3 mg/dL   GFR calc non Af Amer 15 (*) >90 mL/min   GFR calc Af Amer 17 (*) >90 mL/min   Comment:  The eGFR has been calculated     using the CKD EPI equation.     This calculation has not been     validated in all clinical     situations.     eGFR's persistently     <90 mL/min signify     possible Chronic Kidney Disease.  HEPARIN LEVEL (UNFRACTIONATED)     Status: None   Collection Time    02/23/13  7:35 PM      Result Value Range   Heparin Unfractionated 0.48  0.30 - 0.70 IU/mL   Comment:            IF HEPARIN RESULTS ARE BELOW     EXPECTED VALUES, AND PATIENT     DOSAGE HAS BEEN CONFIRMED,     SUGGEST FOLLOW UP TESTING     OF ANTITHROMBIN III  LEVELS.  GLUCOSE, CAPILLARY     Status: Abnormal   Collection Time    02/23/13  9:42 PM      Result Value Range   Glucose-Capillary 172 (*) 70 - 99 mg/dL  CBC     Status: Abnormal   Collection Time    02/24/13  5:38 AM      Result Value Range   WBC 7.8  4.0 - 10.5 K/uL   RBC 3.14 (*) 3.87 - 5.11 MIL/uL   Hemoglobin 8.6 (*) 12.0 - 15.0 g/dL   HCT 45.4 (*) 09.8 - 11.9 %   MCV 85.7  78.0 - 100.0 fL   MCH 27.4  26.0 - 34.0 pg   MCHC 32.0  30.0 - 36.0 g/dL   RDW 14.7 (*) 82.9 - 56.2 %   Platelets 223  150 - 400 K/uL  BASIC METABOLIC PANEL     Status: Abnormal   Collection Time    02/24/13  5:38 AM      Result Value Range   Sodium 138  135 - 145 mEq/L   Potassium 4.8  3.5 - 5.1 mEq/L   Chloride 103  96 - 112 mEq/L   CO2 26  19 - 32 mEq/L   Glucose, Bld 180 (*) 70 - 99 mg/dL   BUN 51 (*) 6 - 23 mg/dL   Creatinine, Ser 1.30 (*) 0.50 - 1.10 mg/dL   Calcium 8.7  8.4 - 86.5 mg/dL   GFR calc non Af Amer 14 (*) >90 mL/min   GFR calc Af Amer 17 (*) >90 mL/min   Comment:            The eGFR has been calculated     using the CKD EPI equation.     This calculation has not been     validated in all clinical     situations.     eGFR's persistently     <90 mL/min signify     possible Chronic Kidney Disease.  HEPARIN LEVEL (UNFRACTIONATED)     Status: Abnormal   Collection Time    02/24/13  5:38 AM      Result Value Range   Heparin Unfractionated 0.23 (*) 0.30 - 0.70 IU/mL   Comment:            IF HEPARIN RESULTS ARE BELOW     EXPECTED VALUES, AND PATIENT     DOSAGE HAS BEEN CONFIRMED,     SUGGEST FOLLOW UP TESTING     OF ANTITHROMBIN III LEVELS.  PROTIME-INR     Status: None   Collection Time    02/24/13  5:38 AM  Result Value Range   Prothrombin Time 13.8  11.6 - 15.2 seconds   INR 1.07  0.00 - 1.49  APTT     Status: Abnormal   Collection Time    02/24/13  5:38 AM      Result Value Range   aPTT 55 (*) 24 - 37 seconds   Comment:            IF BASELINE aPTT IS  ELEVATED,     SUGGEST PATIENT RISK ASSESSMENT     BE USED TO DETERMINE APPROPRIATE     ANTICOAGULANT THERAPY.  GLUCOSE, CAPILLARY     Status: Abnormal   Collection Time    02/24/13  7:38 AM      Result Value Range   Glucose-Capillary 171 (*) 70 - 99 mg/dL   No results found.  Review of Systems  Constitutional: Negative for fever.  Respiratory: Positive for shortness of breath and wheezing.   Cardiovascular: Positive for chest pain, orthopnea and leg swelling.  Gastrointestinal: Negative for nausea, vomiting and abdominal pain.  Neurological: Positive for weakness.    Blood pressure 189/66, pulse 68, temperature 98.5 F (36.9 C), temperature source Oral, resp. rate 22, height 5' 1.5" (1.562 m), weight 191 lb 9.3 oz (86.9 kg), SpO2 99.00%. Physical Exam  Constitutional: She is oriented to person, place, and time.  Cardiovascular: Normal rate, regular rhythm and normal heart sounds.   Respiratory: Effort normal. She has wheezes.  GI: Soft. Bowel sounds are normal. She exhibits distension.  Musculoskeletal: She exhibits edema.  Neurological: She is alert and oriented to person, place, and time.  Psychiatric: She has a normal mood and affect. Her behavior is normal. Judgment and thought content normal.     Assessment/Plan CHF; anasarca; LVH Deconditioning Cr worsening Scheduled for fat pad biopsy to r/o amyloid Pt aware of procedure benefits and risks and agreeable to proceed Consent signed and in chart Discussed with husband- Analicia Skibinski over phone with his consent  Kassadie Pancake A 02/24/2013, 9:56 AM

## 2013-02-24 NOTE — Progress Notes (Addendum)
  Off UF. CVP still up at 16. Remains -1L for the day. Renal u/s with medicorenal disease. Kidney size 10.5 cm bilaterally. Cr worse at 3.3. May be getting to point where we need to consider HD. Will continue to hold UF and diuretics.  Fat pad biopsy performed.  Echo with persistent large pericardial effusion. No change with diuresis. No frank tamponade.  Plan RHC/LHC (pressures only to evaluate for PAH/constriction) tomorrow. May need pericardial window.   Truman Hayward 5:39 PM

## 2013-02-25 ENCOUNTER — Encounter (HOSPITAL_COMMUNITY)
Admission: EM | Disposition: A | Discharge status: Home Health | Payer: Self-pay | Source: Home / Self Care | Attending: Internal Medicine

## 2013-02-25 DIAGNOSIS — I509 Heart failure, unspecified: Secondary | ICD-10-CM

## 2013-02-25 HISTORY — PX: LEFT AND RIGHT HEART CATHETERIZATION WITH CORONARY ANGIOGRAM: SHX5449

## 2013-02-25 LAB — BASIC METABOLIC PANEL
Chloride: 103 mEq/L (ref 96–112)
Creatinine, Ser: 3.31 mg/dL — ABNORMAL HIGH (ref 0.50–1.10)
GFR calc Af Amer: 17 mL/min — ABNORMAL LOW (ref >90)
GFR calc non Af Amer: 15 mL/min — ABNORMAL LOW (ref >90)
Potassium: 4.8 mEq/L (ref 3.5–5.1)

## 2013-02-25 LAB — CBC
Hemoglobin: 8.7 g/dL — ABNORMAL LOW (ref 12.0–15.0)
MCH: 27.4 pg (ref 26.0–34.0)
MCHC: 32.3 g/dL (ref 30.0–36.0)

## 2013-02-25 LAB — POCT I-STAT 3, ART BLOOD GAS (G3+)
Acid-Base Excess: 1 mmol/L (ref 0.0–2.0)
Bicarbonate: 26.1 mEq/L — ABNORMAL HIGH (ref 20.0–24.0)
pCO2 arterial: 43.8 mmHg (ref 35.0–45.0)
pH, Arterial: 7.383 (ref 7.350–7.450)
pO2, Arterial: 112 mmHg — ABNORMAL HIGH (ref 80.0–100.0)

## 2013-02-25 LAB — POCT I-STAT 3, VENOUS BLOOD GAS (G3P V)
Acid-base deficit: 1 mmol/L (ref 0.0–2.0)
Bicarbonate: 26.6 mEq/L — ABNORMAL HIGH (ref 20.0–24.0)
O2 Saturation: 68 %
TCO2: 28 mmol/L (ref 0–100)
pH, Ven: 7.328 — ABNORMAL HIGH (ref 7.250–7.300)
pO2, Ven: 38 mmHg (ref 30.0–45.0)

## 2013-02-25 LAB — CARBOXYHEMOGLOBIN
Carboxyhemoglobin: 1.9 % — ABNORMAL HIGH (ref 0.5–1.5)
Methemoglobin: 1.6 % — ABNORMAL HIGH (ref 0.0–1.5)
Total hemoglobin: 8.3 g/dL — ABNORMAL LOW (ref 12.0–16.0)

## 2013-02-25 LAB — GLUCOSE, CAPILLARY: Glucose-Capillary: 140 mg/dL — ABNORMAL HIGH (ref 70–99)

## 2013-02-25 LAB — CREATININE, SERUM: Creatinine, Ser: 3.32 mg/dL — ABNORMAL HIGH (ref 0.50–1.10)

## 2013-02-25 SURGERY — LEFT AND RIGHT HEART CATHETERIZATION WITH CORONARY ANGIOGRAM
Anesthesia: LOCAL

## 2013-02-25 MED ORDER — ACETAMINOPHEN 325 MG PO TABS: 650.0000 mg | ORAL_TABLET | ORAL | Status: DC | PRN

## 2013-02-25 MED ORDER — LIDOCAINE HCL (PF) 1 % IJ SOLN
INTRAMUSCULAR | Status: AC
  Filled 2013-02-25: qty 30

## 2013-02-25 MED ORDER — HEPARIN (PORCINE) IN NACL 2-0.9 UNIT/ML-% IJ SOLN
INTRAMUSCULAR | Status: AC
  Filled 2013-02-25: qty 1000

## 2013-02-25 MED ORDER — ONDANSETRON HCL 4 MG/2ML IJ SOLN: 4.0000 mg | Freq: Four times a day (QID) | INTRAMUSCULAR | Status: DC | PRN

## 2013-02-25 MED ORDER — HEPARIN SODIUM (PORCINE) 5000 UNIT/ML IJ SOLN: 5000.0000 [IU] | Freq: Three times a day (TID) | INTRAMUSCULAR | Status: DC

## 2013-02-25 MED ORDER — DOPAMINE-DEXTROSE 3.2-5 MG/ML-% IV SOLN
3.0000 ug/kg/min | INTRAVENOUS | Status: DC
Start: 2013-02-25 — End: 2013-02-26
  Administered 2013-02-25: 3 ug/kg/min via INTRAVENOUS
  Filled 2013-02-25: qty 250

## 2013-02-25 MED ORDER — FUROSEMIDE 10 MG/ML IJ SOLN
15.0000 mg/h | INTRAVENOUS | Status: DC
  Administered 2013-02-25: 10 mg/h via INTRAVENOUS
  Administered 2013-02-26: 15 mg/h via INTRAVENOUS
  Filled 2013-02-25 (×3): qty 25

## 2013-02-25 NOTE — Interval H&P Note (Signed)
History and Physical Interval Note:  02/25/2013 9:22 AM  Gail Chapman  has presented today for surgery, with the diagnosis of Heart failure  The various methods of treatment have been discussed with the patient and family. After consideration of risks, benefits and other options for treatment, the patient has consented to  Procedure(s): LEFT AND RIGHT HEART CATHETERIZATION WITH CORONARY ANGIOGRAM (N/A) as a surgical intervention .  The patient's history has been reviewed, patient examined, no change in status, stable for surgery.  I have reviewed the patient's chart and labs.  Questions were answered to the patient's satisfaction.     Tacha Manni

## 2013-02-25 NOTE — CV Procedure (Signed)
Cardiac Cath Procedure Note  Indication: HF, pericardial effusion evaluate for constriction  Procedures performed:  1) Right heart cathererization 2) Left ventriculogram  Description of procedure:     The risks and indication of the procedure were explained. Consent was signed and placed on the chart. An appropriate timeout was taken prior to the procedure. The right groin was prepped and draped in the routine sterile fashion and anesthetized with 1% local lidocaine.   A 5 FR arterial sheath was placed in the right femoral artery using a modified Seldinger technique. A standard pigtail catheter was used for the LHC A 7 FR venous sheath was placed in the right femoral vein using a modified Seldinger technique. A standard Swan-Ganz catheter was used for the procedure. Simultaneous LR-RV, LV-PCWP and LV-RA tracings were obtained.  Complications:  None apparent  Findings:  RA = 20 RV = 94/6/23 PA = 96/41 (59) PCW = 24 Ao = 167/70 (106) LV = 164/17/28  Fick cardiac output/index = 7.8/4.2 PVR = 4.4 [place] SVR = 880  FA sat = 98% PA sat = 68%, 71% There was no signficant gradient across the aortic valve on pullback.  No interaction on RV-LV tracings.   Assessment: 1. Markedly elevated RV and LV pressures without interaction suggestive of restrictive physiology 2. Pulmonary venous HTN. 3. Normal cardiac output ` Plan/Discussion:  Cath suggestive of restrictive physiology. No evidence of constriction despite large pericardial effusion. Will continue to dry and diurese with IV lasix and low-dose dopamine as renal function tolerates. If unable to diurese may need to consider HD.  Await fat pad biopsy results, if non-diagnostic will d/w Dr. Donata Clay regarding possibility of pericardial window and open myocardial biopsy.  Arvilla Meres, MD 9:48 AM

## 2013-02-25 NOTE — Progress Notes (Signed)
Advanced Heart Failure Rounding Note   Subjective:    57 y/o female with a h/o HTN, DM, diastolic HF and PAH. Admitted 5/14 with HTN crisis (BP 260/120) and massive volume overload.   Over the weekend she continued on UF. Cr remains up. Yesterday UF stopped.  Overall weight down 21 pounds. CVP 14 today. Drowsy but arousable. Denies dyspnea.    Urine IFE with light chains concerning for amyloid.  02/24/13 Fat Pad biopsy completed.  5/19/4 Renal US not hydronephrosis    Creatinine 2.07>3.02>3.26>3.38>3.31 CO-OX 84    Objective:    Vital Signs:   Temp:  [98.1 F (36.7 C)-99.1 F (37.3 C)] 98.1 F (36.7 C) (05/20 0339) Pulse Rate:  [66-70] 66 (05/20 0400) Resp:  [12-30] 17 (05/20 0400) BP: (138-189)/(49-73) 151/50 mmHg (05/20 0605) SpO2:  [97 %-100 %] 99 % (05/20 0400) Weight:  [192 lb 10.9 oz (87.4 kg)] 192 lb 10.9 oz (87.4 kg) (05/20 0500)    Weight change: Filed Weights   02/23/13 0400 02/24/13 0400 02/25/13 0500  Weight: 194 lb 10.7 oz (88.3 kg) 191 lb 9.3 oz (86.9 kg) 192 lb 10.9 oz (87.4 kg)    Intake/Output:   Intake/Output Summary (Last 24 hours) at 02/25/13 0719 Last data filed at 02/25/13 0600  Gross per 24 hour  Intake    238 ml  Output   1140 ml  Net   -902 ml     Physical Exam: CVP 14 General: Pleasant Psych: Normal affect.  Neuro: Alert and oriented X 3. Moves all extremities spontaneously.  HEENT: Normal.  Rt IJ area small hematoma.  Lt subclavian UF catheter Neck: Supple without bruits. JVP jaw. Carotids 2+ bilaterally without bruits  Lungs: Resp regular and unlabored, crackles in in bases on 2 liters [place] Heart: RRR no s3, + s4, 2/6 TR  Abdomen: protuberant, firm and distended. nontender, BS + x 4.  Extremities: No clubbing, cyanosis. 2-3+ bilat LEE   Telemetry: SR 60-70s  Labs: Basic Metabolic Panel:  Recent Labs Lab 02/19/13 1429 02/19/13 2200  02/22/13 1700 02/23/13 0510 02/23/13 1935 02/24/13 0538 02/25/13 0453  NA 141  --   <  > 138 138 135 138 138  K 4.5  --   < > 4.4 4.6 4.7 4.8 4.8  CL 105  --   < > 101 102 100 103 103  CO2 28  --   < > 25 28 27 26 25   GLUCOSE 136*  --   < > 148* 99 168* 180* 115*  BUN 33*  --   < > 40* 43* 47* 51* 55*  CREATININE 2.02* 2.13*  < > 2.75* 3.02* 3.26* 3.38* 3.31*  CALCIUM 9.0  --   < > 8.6 8.7 8.8 8.7 8.6  MG  --  2.1  --   --   --   --   --   --   < > = values in this interval not displayed.  Liver Function Tests:  Recent Labs Lab 02/20/13 0500  AST 22  ALT 25  ALKPHOS 150*  BILITOT 0.3  PROT 6.1  ALBUMIN 2.3*   No results found for this basename: LIPASE, AMYLASE,  in the last 168 hours No results found for this basename: AMMONIA,  in the last 168 hours  CBC:  Recent Labs Lab 02/22/13 0500 02/22/13 1700 02/23/13 0510 02/23/13 1935 02/24/13 0538  WBC 7.4 7.7 7.2 9.1 7.8  HGB 8.9* 9.1* 8.6* 9.1* 8.6*  HCT 28.2* 28.8* 26.8* 28.6* 26.9*  MCV 85.5 85.5 85.4 85.1 85.7  PLT 228 243 227 249 223    Cardiac Enzymes:  Recent Labs Lab 02/19/13 2205  TROPONINI <0.30    BNP: BNP (last 3 results)  Recent Labs  02/19/13 1429 02/20/13 0500  PROBNP 45515.0* 57495.0*     Other results:   Imaging: US Renal  02/24/2013   *RADIOLOGY REPORT*  Clinical Data: History of renal failure, diabetes, hypertension  RENAL/URINARY TRACT ULTRASOUND COMPLETE  Comparison:  None.  Findings:  Right Kidney:  No hydronephrosis is seen.  The right kidney measures 10.4 cm sagittally.  Left Kidney:  No hydronephrosis is noted.  The left kidney measures 10.5 cm.  Bladder:  The urinary bladder is decompressed with a Foley catheter present.  Bilateral pleural effusions are present and there is a small amount of ascites.  IMPRESSION:  1.  No hydronephrosis. 2.  Bilateral pleural effusions. 3.  Small amount of ascites. 4.  The urinary bladder is decompressed with a Foley catheter present.   Original Report Authenticated By: Dwyane Dee, M.D.   Ct Biopsy  02/24/2013   *RADIOLOGY REPORT*   Clinical data:    amyloid  CT-GUIDED CORE BIOPSY  Technique and findings: The procedure, risks (including but not limited to bleeding, infection, organ damage), benefits, and alternatives were explained to the patient.  Questions regarding the procedure were encouraged and answered.  The patient understands and consents to the procedure.Select axial scans through the lower abdomen were performed and an appropriate skin entry site was determined.  Site was marked, prepped with Betadine, draped in usual sterile fashion, infiltrated locally with 1% lidocaine.  Intravenous Fentanyl and Versed were administered as conscious sedation during continuous cardiorespiratory monitoring by the radiology RN, with a total moderate sedation time of six minutes.  Under CT fluoroscopic guidance, a 17 gauge trocar needle was advanced into the abdominal wall fat pad in the right lower quadrant.  Once needle tip position was confirmed, coaxial 18-gauge core biopsy samples were obtained, submitted in formalin to surgical pathology.  The guide needle was removed.  Fluoroscopy time: 2 seconds  IMPRESSION: Technically successful abdominal fat pad biopsy under CT.   Original Report Authenticated By: D. Andria Rhein, MD     Medications:     Scheduled Medications: . antiseptic oral rinse  15 mL Mouth Rinse q12n4p  . aspirin  324 mg Oral Pre-Cath  . atorvastatin  40 mg Oral Daily  . carvedilol  25 mg Oral BID WC  . chlorhexidine  15 mL Mouth Rinse BID  . docusate sodium  100 mg Oral BID  . heparin  5,000 Units Subcutaneous Q8H  . hydrALAZINE  75 mg Oral Q8H  . insulin aspart  0-15 Units Subcutaneous TID WC  . insulin aspart  4 Units Subcutaneous TID WC  . insulin detemir  14 Units Subcutaneous QHS  . isosorbide mononitrate  60 mg Oral Daily  . pantoprazole  40 mg Oral Daily  . sodium chloride  3 mL Intravenous Q12H  . sodium chloride  3 mL Intravenous Q12H  . sodium chloride  3 mL Intravenous Q12H    Infusions:     PRN Medications: sodium chloride, sodium chloride, sodium chloride, acetaminophen, hydrALAZINE, ondansetron (ZOFRAN) IV, promethazine, sodium chloride, sodium chloride, sodium chloride   Assessment:   1. Acute on chronic diastolic chf with anasarca 2. Hypertensive crisis 3. Pulm HTN:  4. OSA: Cont cpap.  5. DM 6. Acute/chronic renal failure:  (baseline unclear) 7. Acute respiratory failure 8. Protein  calorie malnutrition (albumin 2.3) 9. Large pericardial effusion  Plan/Discussion:   Plan RHC/LHC to today assess coronaries and hemodynamics.   CLEGG,AMY,NP-C 7:19 AM Advanced Heart Failure Team Pager [phone] (M-F; 7a - 4p)  Please contact [facility] for night-coverage after hours (4p -7a ) and weekends on [email or web address]   Patient seen and examined with Tonye Becket, NP. We discussed all aspects of the encounter. I agree with the assessment and plan as stated above.  See cath note for further details.  Basir Niven,MD 5:13 PM

## 2013-02-25 NOTE — H&P (View-Only) (Signed)
Advanced Heart Failure Rounding Note   Subjective:    57 y/o female with a h/o HTN, DM, diastolic HF and PAH. Admitted 5/14 with HTN crisis (BP 260/120) and massive volume overload.   Over the weekend she continued on UF. UF decreased to progressive renal failure. Cr now up to 3.3 Pulled central line out last night by accident. Breathing much better. No further orthopnea  Urine IFE with light chains concerning for amyloid. Yesterday hydralazine,  Imdur, and carvedilol titrated up due to hypertension. Overall weight down 22 pounds. CVP was still 21 yesterday  Creatinine 2.07>3.02>3.26>3.38    Objective:    Vital Signs:   Temp:  [97.4 F (36.3 C)-98.7 F (37.1 C)] 98.5 F (36.9 C) (05/19 0742) Pulse Rate:  [67-73] 68 (05/18 2338) Resp:  [12-23] 22 (05/19 0742) BP: (162-189)/(58-68) 189/66 mmHg (05/19 0742) SpO2:  [99 %-100 %] 99 % (05/19 0742) Weight:  [86.9 kg (191 lb 9.3 oz)] 86.9 kg (191 lb 9.3 oz) (05/19 0400)    Weight change: Filed Weights   02/22/13 0500 02/23/13 0400 02/24/13 0400  Weight: 90.9 kg (200 lb 6.4 oz) 88.3 kg (194 lb 10.7 oz) 86.9 kg (191 lb 9.3 oz)    Intake/Output:   Intake/Output Summary (Last 24 hours) at 02/24/13 0752 Last data filed at 02/24/13 0700  Gross per 24 hour  Intake 1250.5 ml  Output   2785 ml  Net -1534.5 ml     Physical Exam: General: Pleasant Psych: Normal affect.  Neuro: Alert and oriented X 3. Moves all extremities spontaneously.  HEENT: Normal.  Rt IJ area small hematoma.  Lt subclavian UF catheter Neck: Supple without bruits. JVP jaw. Carotids 2+ bilaterally without bruits  Lungs: Resp regular and unlabored, crackles in in bases Heart: RRR no s3, + s4, 2/6 TR  Abdomen: protuberant, firm and distended. nontender, BS + x 4.  Extremities: No clubbing, cyanosis. 2-3+ bilat LEE   Telemetry: SR 60-70s  Labs: Basic Metabolic Panel:  Recent Labs Lab 02/19/13 1429 02/19/13 2200  02/22/13 0500 02/22/13 1700  02/23/13 0510 02/23/13 1935 02/24/13 0538  NA 141  --   < > 140 138 138 135 138  K 4.5  --   < > 4.2 4.4 4.6 4.7 4.8  CL 105  --   < > 104 101 102 100 103  CO2 28  --   < > 30 25 28 27 26   GLUCOSE 136*  --   < > 94 148* 99 168* 180*  BUN 33*  --   < > 37* 40* 43* 47* 51*  CREATININE 2.02* 2.13*  < > 2.62* 2.75* 3.02* 3.26* 3.38*  CALCIUM 9.0  --   < > 8.5 8.6 8.7 8.8 8.7  MG  --  2.1  --   --   --   --   --   --   < > = values in this interval not displayed.  Liver Function Tests:  Recent Labs Lab 02/20/13 0500  AST 22  ALT 25  ALKPHOS 150*  BILITOT 0.3  PROT 6.1  ALBUMIN 2.3*   No results found for this basename: LIPASE, AMYLASE,  in the last 168 hours No results found for this basename: AMMONIA,  in the last 168 hours  CBC:  Recent Labs Lab 02/22/13 0500 02/22/13 1700 02/23/13 0510 02/23/13 1935 02/24/13 0538  WBC 7.4 7.7 7.2 9.1 7.8  HGB 8.9* 9.1* 8.6* 9.1* 8.6*  HCT 28.2* 28.8* 26.8* 28.6* 26.9*  MCV 85.5  85.5 85.4 85.1 85.7  PLT 228 243 227 249 223    Cardiac Enzymes:  Recent Labs Lab 02/19/13 2205  TROPONINI <0.30    BNP: BNP (last 3 results)  Recent Labs  02/19/13 1429 02/20/13 0500  PROBNP 45515.0* 57495.0*     Other results:   Imaging: No results found.   Medications:     Scheduled Medications: . antiseptic oral rinse  15 mL Mouth Rinse q12n4p  . atorvastatin  40 mg Oral Daily  . carvedilol  25 mg Oral BID WC  . chlorhexidine  15 mL Mouth Rinse BID  . docusate sodium  100 mg Oral BID  . hydrALAZINE  75 mg Oral Q8H  . insulin aspart  0-15 Units Subcutaneous TID WC  . insulin aspart  4 Units Subcutaneous TID WC  . insulin detemir  14 Units Subcutaneous QHS  . isosorbide mononitrate  60 mg Oral Daily  . pantoprazole  40 mg Oral Daily  . sodium chloride  3 mL Intravenous Q12H    Infusions: . heparin 1,200 Units/hr (02/24/13 0645)  . niCARDipine Stopped (02/21/13 1453)  . nitroGLYCERIN Stopped (02/20/13 1400)    PRN  Medications: sodium chloride, acetaminophen, hydrALAZINE, ondansetron (ZOFRAN) IV, promethazine, sodium chloride   Assessment:   1. Acute on chronic diastolic chf with anasarca 2. Hypertensive crisis 3. Pulm HTN:  4. OSA: Cont cpap.  5. DM 6. Acute/chronic renal failure:  (baseline unclear) 7. Acute respiratory failure 8. Protein calorie malnutrition (albumin 2.3) 9. Large pericardial effusion  Plan/Discussion:    Remains very tenuous. Still with marked volume overload but renal function getting steadily worse despite persistently elevated CVP. Suspect she has restriction +/- constriction with large effusion. Stop UF. Repeat limited echo today to evaluate effusion. Amyloid remains high on the differential although HTN could explain LVH.  Plan for today: 1) Stop UF 2) Check limited echo to see if effusion is improving 3) Renal u/s 4) Fat pad biopsy 5) Attempt to get records from [facility] to see baseline CR  Will not be aggressive with BP management for now. May need RHC tomorrow. Co-ox was ok yesterday,   Truman Hayward 8:08 AM Advanced Heart Failure Team Pager [phone] (M-F; 7a - 4p)  Please contact [facility] for night-coverage after hours (4p -7a ) and weekends on [email or web address]

## 2013-02-25 NOTE — Progress Notes (Signed)
Echocardiogram 2D Echocardiogram limited has been performed.  Kaydin Karbowski 02/25/2013, 1:37 PM

## 2013-02-25 NOTE — Progress Notes (Signed)
Placed pt. On CPAP of 8cm H20 via FFM with 3L O2 bled in. Pt. Is tolerating CPAP well at this time. Pt. Was made aware to call anytime during the night if she had any complications with CPAP.

## 2013-02-25 NOTE — Progress Notes (Signed)
   Fat pad biopsy results: limited specimen but negative for amyloid.  Spoke with Dr. Donata Clay who will see patient for pericardial window and myocardial biopsy. I suspect amyloid may be negative and this may be mostly related to hypertensive heart and kidney disease. Will watch response to lasix and dopamine but suspect we will need Renal to see soon.   Cait Locust,MD 5:22 PM

## 2013-02-26 ENCOUNTER — Encounter (HOSPITAL_COMMUNITY): Payer: BC Managed Care – PPO

## 2013-02-26 ENCOUNTER — Inpatient Hospital Stay (HOSPITAL_COMMUNITY): Payer: BC Managed Care – PPO

## 2013-02-26 ENCOUNTER — Encounter (HOSPITAL_COMMUNITY)
Admission: EM | Disposition: A | Discharge status: Home Health | Payer: Self-pay | Source: Home / Self Care | Attending: Internal Medicine

## 2013-02-26 ENCOUNTER — Encounter (HOSPITAL_COMMUNITY): Payer: Self-pay | Admitting: Certified Registered"

## 2013-02-26 ENCOUNTER — Encounter (HOSPITAL_COMMUNITY): Payer: Self-pay | Admitting: Anesthesiology

## 2013-02-26 ENCOUNTER — Other Ambulatory Visit: Payer: Self-pay | Admitting: *Deleted

## 2013-02-26 ENCOUNTER — Inpatient Hospital Stay (HOSPITAL_COMMUNITY): Payer: BC Managed Care – PPO | Admitting: Anesthesiology

## 2013-02-26 DIAGNOSIS — R4182 Altered mental status, unspecified: Secondary | ICD-10-CM

## 2013-02-26 DIAGNOSIS — N189 Chronic kidney disease, unspecified: Secondary | ICD-10-CM

## 2013-02-26 DIAGNOSIS — I5033 Acute on chronic diastolic (congestive) heart failure: Secondary | ICD-10-CM

## 2013-02-26 DIAGNOSIS — I309 Acute pericarditis, unspecified: Secondary | ICD-10-CM

## 2013-02-26 HISTORY — PX: SUBXYPHOID PERICARDIAL WINDOW: SHX5075

## 2013-02-26 LAB — COMPREHENSIVE METABOLIC PANEL
ALT: 19 U/L (ref 0–35)
AST: 19 U/L (ref 0–37)
Albumin: 2.3 g/dL — ABNORMAL LOW (ref 3.5–5.2)
Alkaline Phosphatase: 115 U/L (ref 39–117)
BUN: 64 mg/dL — ABNORMAL HIGH (ref 6–23)
CO2: 25 mEq/L (ref 19–32)
Calcium: 8.9 mg/dL (ref 8.4–10.5)
Chloride: 100 mEq/L (ref 96–112)
Creatinine, Ser: 3.28 mg/dL — ABNORMAL HIGH (ref 0.50–1.10)
GFR calc Af Amer: 17 mL/min — ABNORMAL LOW (ref >90)
GFR calc non Af Amer: 15 mL/min — ABNORMAL LOW (ref >90)
Glucose, Bld: 130 mg/dL — ABNORMAL HIGH (ref 70–99)
Potassium: 4.8 mEq/L (ref 3.5–5.1)
Sodium: 137 mEq/L (ref 135–145)
Total Bilirubin: 0.3 mg/dL (ref 0.3–1.2)
Total Protein: 6.4 g/dL (ref 6.0–8.3)

## 2013-02-26 LAB — POCT I-STAT 3, ART BLOOD GAS (G3+)
Acid-Base Excess: 4 mmol/L — ABNORMAL HIGH (ref 0.0–2.0)
O2 Saturation: 100 %
pCO2 arterial: 51.8 mmHg — ABNORMAL HIGH (ref 35.0–45.0)
pCO2 arterial: 37.4 mmHg (ref 35.0–45.0)
pH, Arterial: 7.345 — ABNORMAL LOW (ref 7.350–7.450)
pO2, Arterial: 69 mmHg — ABNORMAL LOW (ref 80.0–100.0)
pO2, Arterial: 415 mmHg — ABNORMAL HIGH (ref 80.0–100.0)

## 2013-02-26 LAB — CBC
HCT: 26.7 % — ABNORMAL LOW (ref 36.0–46.0)
Platelets: 241 10*3/uL (ref 150–400)
RBC: 3.22 MIL/uL — ABNORMAL LOW (ref 3.87–5.11)
RDW: 18 % — ABNORMAL HIGH (ref 11.5–15.5)
WBC: 6.9 10*3/uL (ref 4.0–10.5)

## 2013-02-26 LAB — POCT I-STAT 7, (LYTES, BLD GAS, ICA,H+H)
Hemoglobin: 8.2 g/dL — ABNORMAL LOW (ref 12.0–15.0)
O2 Saturation: 100 %
Patient temperature: 35.5
Potassium: 4.4 mEq/L (ref 3.5–5.1)
TCO2: 29 mmol/L (ref 0–100)
pCO2 arterial: 37.4 mmHg (ref 35.0–45.0)
pH, Arterial: 7.467 — ABNORMAL HIGH (ref 7.350–7.450)
pO2, Arterial: 374 mmHg — ABNORMAL HIGH (ref 80.0–100.0)

## 2013-02-26 LAB — LACTATE DEHYDROGENASE, PLEURAL OR PERITONEAL FLUID

## 2013-02-26 LAB — GLUCOSE, SEROUS FLUID: Glucose, Fluid: 140 mg/dL

## 2013-02-26 LAB — IRON AND TIBC
Saturation Ratios: 14 % — ABNORMAL LOW (ref 20–55)
UIBC: 235 ug/dL (ref 125–400)

## 2013-02-26 LAB — BASIC METABOLIC PANEL
CO2: 26 mEq/L (ref 19–32)
Calcium: 8.8 mg/dL (ref 8.4–10.5)
Chloride: 98 mEq/L (ref 96–112)
Glucose, Bld: 140 mg/dL — ABNORMAL HIGH (ref 70–99)
Sodium: 134 mEq/L — ABNORMAL LOW (ref 135–145)

## 2013-02-26 LAB — PROTEIN, BODY FLUID

## 2013-02-26 LAB — BODY FLUID CELL COUNT WITH DIFFERENTIAL
Eos, Fluid: 0 %
Lymphs, Fluid: 23 %
Neutrophil Count, Fluid: 5 % (ref 0–25)
Other Cells, Fluid: 0 %

## 2013-02-26 LAB — CARBOXYHEMOGLOBIN
Carboxyhemoglobin: 1.4 % (ref 0.5–1.5)
O2 Saturation: 85.1 %
Total hemoglobin: 9.8 g/dL — ABNORMAL LOW (ref 12.0–16.0)

## 2013-02-26 LAB — POCT I-STAT 4, (NA,K, GLUC, HGB,HCT)
HCT: 27 % — ABNORMAL LOW (ref 36.0–46.0)
Hemoglobin: 9.2 g/dL — ABNORMAL LOW (ref 12.0–15.0)
Potassium: 4.4 mEq/L (ref 3.5–5.1)
Sodium: 137 mEq/L (ref 135–145)

## 2013-02-26 LAB — PREPARE RBC (CROSSMATCH)

## 2013-02-26 LAB — AMMONIA: Ammonia: 23 umol/L (ref 11–60)

## 2013-02-26 LAB — GLUCOSE, CAPILLARY: Glucose-Capillary: 128 mg/dL — ABNORMAL HIGH (ref 70–99)

## 2013-02-26 SURGERY — CREATION, PERICARDIAL WINDOW, SUBXIPHOID APPROACH
Anesthesia: General | Site: Chest | Wound class: Clean Contaminated

## 2013-02-26 MED ORDER — SODIUM CHLORIDE 0.9 % IJ SOLN
3.0000 mL | Freq: Two times a day (BID) | INTRAMUSCULAR | Status: DC
  Administered 2013-02-27 – 2013-03-05 (×4): 3 mL via INTRAVENOUS

## 2013-02-26 MED ORDER — ONDANSETRON HCL 4 MG/2ML IJ SOLN
4.0000 mg | Freq: Four times a day (QID) | INTRAMUSCULAR | Status: DC | PRN
  Administered 2013-02-28 – 2013-03-06 (×7): 4 mg via INTRAVENOUS
  Filled 2013-02-26 (×8): qty 2

## 2013-02-26 MED ORDER — PANTOPRAZOLE SODIUM 40 MG PO TBEC: 40.0000 mg | DELAYED_RELEASE_TABLET | Freq: Every day | ORAL | Status: DC

## 2013-02-26 MED ORDER — METOPROLOL TARTRATE 1 MG/ML IV SOLN
2.5000 mg | INTRAVENOUS | Status: DC | PRN
  Administered 2013-03-01 – 2013-03-04 (×2): 5 mg via INTRAVENOUS
  Filled 2013-02-26 (×2): qty 5

## 2013-02-26 MED ORDER — ROCURONIUM BROMIDE 50 MG/5ML IV SOLN
INTRAVENOUS | Status: AC
  Administered 2013-02-26: 50 mg via INTRAVENOUS
  Filled 2013-02-26: qty 2

## 2013-02-26 MED ORDER — METOPROLOL TARTRATE 12.5 MG HALF TABLET
12.5000 mg | ORAL_TABLET | Freq: Two times a day (BID) | ORAL | Status: DC
  Administered 2013-03-01 – 2013-03-02 (×3): 12.5 mg via ORAL
  Filled 2013-02-26 (×11): qty 1

## 2013-02-26 MED ORDER — ACETAMINOPHEN 10 MG/ML IV SOLN
1000.0000 mg | Freq: Once | INTRAVENOUS | Status: AC
  Administered 2013-02-26: 1000 mg via INTRAVENOUS
  Filled 2013-02-26: qty 100

## 2013-02-26 MED ORDER — MIDAZOLAM HCL 2 MG/2ML IJ SOLN: 2.0000 mg | Freq: Once | INTRAMUSCULAR | Status: AC

## 2013-02-26 MED ORDER — ETOMIDATE 2 MG/ML IV SOLN: 10.0000 mg | Freq: Once | INTRAVENOUS | Status: AC

## 2013-02-26 MED ORDER — ISOSORBIDE MONONITRATE ER 60 MG PO TB24
90.0000 mg | ORAL_TABLET | Freq: Every day | ORAL | Status: DC
  Administered 2013-02-26: 90 mg via ORAL
  Filled 2013-02-26: qty 1

## 2013-02-26 MED ORDER — MIDAZOLAM HCL 5 MG/5ML IJ SOLN
INTRAMUSCULAR | Status: DC | PRN
  Administered 2013-02-26: 2 mg via INTRAVENOUS

## 2013-02-26 MED ORDER — BISACODYL 10 MG RE SUPP: 10.0000 mg | Freq: Every day | RECTAL | Status: DC

## 2013-02-26 MED ORDER — DEXTROSE 5 % IV SOLN
1.5000 g | INTRAVENOUS | Status: DC
  Filled 2013-02-26: qty 1.5

## 2013-02-26 MED ORDER — LACTATED RINGERS IV SOLN
INTRAVENOUS | Status: DC | PRN
  Administered 2013-02-26: 14:00:00 via INTRAVENOUS

## 2013-02-26 MED ORDER — SUCCINYLCHOLINE CHLORIDE 20 MG/ML IJ SOLN
INTRAMUSCULAR | Status: AC
  Filled 2013-02-26: qty 1

## 2013-02-26 MED ORDER — SODIUM CHLORIDE 0.9 % IV SOLN: INTRAVENOUS | Status: DC

## 2013-02-26 MED ORDER — ACETAMINOPHEN 160 MG/5ML PO SOLN
975.0000 mg | Freq: Four times a day (QID) | ORAL | Status: AC
  Administered 2013-02-26 – 2013-02-28 (×7): 975 mg
  Filled 2013-02-26 (×2): qty 20.3
  Filled 2013-02-26 (×5): qty 40.6

## 2013-02-26 MED ORDER — FENTANYL CITRATE 0.05 MG/ML IJ SOLN: 100.0000 ug | Freq: Once | INTRAMUSCULAR | Status: AC

## 2013-02-26 MED ORDER — ROCURONIUM BROMIDE 50 MG/5ML IV SOLN
50.0000 mg | Freq: Once | INTRAVENOUS | Status: AC
  Filled 2013-02-26: qty 5

## 2013-02-26 MED ORDER — SODIUM CHLORIDE 0.9 % IJ SOLN: 3.0000 mL | INTRAMUSCULAR | Status: DC | PRN

## 2013-02-26 MED ORDER — METOPROLOL TARTRATE 25 MG/10 ML ORAL SUSPENSION
12.5000 mg | Freq: Two times a day (BID) | ORAL | Status: DC
  Administered 2013-02-26 – 2013-02-28 (×3): 12.5 mg
  Filled 2013-02-26 (×11): qty 5

## 2013-02-26 MED ORDER — ETOMIDATE 2 MG/ML IV SOLN
INTRAVENOUS | Status: AC
  Administered 2013-02-26: 10 mg via INTRAVENOUS
  Filled 2013-02-26: qty 20

## 2013-02-26 MED ORDER — FAMOTIDINE IN NACL 20-0.9 MG/50ML-% IV SOLN
20.0000 mg | Freq: Two times a day (BID) | INTRAVENOUS | Status: AC
  Administered 2013-02-26 – 2013-02-27 (×2): 20 mg via INTRAVENOUS
  Filled 2013-02-26 (×2): qty 50

## 2013-02-26 MED ORDER — NICARDIPINE HCL IN NACL 20-0.86 MG/200ML-% IV SOLN
5.0000 mg/h | INTRAVENOUS | Status: DC
  Administered 2013-02-26: 5 mg/h via INTRAVENOUS
  Administered 2013-02-27: 2.5 mg/h via INTRAVENOUS
  Administered 2013-02-27: 5 mg/h via INTRAVENOUS
  Administered 2013-02-27 – 2013-02-28 (×2): 2.5 mg/h via INTRAVENOUS
  Administered 2013-02-28: 5 mg/h via INTRAVENOUS
  Administered 2013-03-01: 2.5 mg/h via INTRAVENOUS
  Filled 2013-02-26 (×8): qty 200

## 2013-02-26 MED ORDER — MIDAZOLAM HCL 2 MG/2ML IJ SOLN
INTRAMUSCULAR | Status: AC
  Administered 2013-02-26: 2 mg via INTRAVENOUS
  Filled 2013-02-26: qty 4

## 2013-02-26 MED ORDER — LACTATED RINGERS IV SOLN: 500.0000 mL | Freq: Once | INTRAVENOUS | Status: AC | PRN

## 2013-02-26 MED ORDER — INSULIN ASPART 100 UNIT/ML ~~LOC~~ SOLN: 2.0000 [IU] | SUBCUTANEOUS | Status: DC

## 2013-02-26 MED ORDER — PHENYLEPHRINE HCL 10 MG/ML IJ SOLN
30.0000 ug/min | INTRAMUSCULAR | Status: DC
  Filled 2013-02-26 (×2): qty 2

## 2013-02-26 MED ORDER — HEPARIN SODIUM (PORCINE) 1000 UNIT/ML IJ SOLN
INTRAMUSCULAR | Status: AC
  Filled 2013-02-26: qty 1

## 2013-02-26 MED ORDER — DEXMEDETOMIDINE HCL IN NACL 200 MCG/50ML IV SOLN
0.1000 ug/kg/h | INTRAVENOUS | Status: DC
  Administered 2013-02-26 (×3): 0.7 ug/kg/h via INTRAVENOUS
  Administered 2013-02-27: 0.5 ug/kg/h via INTRAVENOUS
  Administered 2013-02-27: 0.4 ug/kg/h via INTRAVENOUS
  Administered 2013-02-27: 0.7 ug/kg/h via INTRAVENOUS
  Administered 2013-02-27: 0.6 ug/kg/h via INTRAVENOUS
  Administered 2013-02-27: 0.7 ug/kg/h via INTRAVENOUS
  Administered 2013-02-28 (×2): 0.6 ug/kg/h via INTRAVENOUS
  Administered 2013-02-28: 0.4 ug/kg/h via INTRAVENOUS
  Administered 2013-02-28: 0.6 ug/kg/h via INTRAVENOUS
  Filled 2013-02-26 (×12): qty 50

## 2013-02-26 MED ORDER — FENTANYL CITRATE 0.05 MG/ML IJ SOLN
INTRAMUSCULAR | Status: DC | PRN
  Administered 2013-02-26 (×2): 50 ug via INTRAVENOUS

## 2013-02-26 MED ORDER — ONDANSETRON HCL 4 MG/2ML IJ SOLN: 4.0000 mg | Freq: Four times a day (QID) | INTRAMUSCULAR | Status: DC | PRN

## 2013-02-26 MED ORDER — MIDAZOLAM HCL 2 MG/2ML IJ SOLN: 1.0000 mg | INTRAMUSCULAR | Status: DC | PRN

## 2013-02-26 MED ORDER — ALBUMIN HUMAN 5 % IV SOLN: 250.0000 mL | INTRAVENOUS | Status: AC | PRN

## 2013-02-26 MED ORDER — PROCHLORPERAZINE EDISYLATE 5 MG/ML IJ SOLN
5.0000 mg | Freq: Four times a day (QID) | INTRAMUSCULAR | Status: DC | PRN
Start: 2013-02-26 — End: 2013-02-26
  Administered 2013-02-26: 5 mg via INTRAVENOUS
  Filled 2013-02-26: qty 1

## 2013-02-26 MED ORDER — MORPHINE SULFATE 2 MG/ML IJ SOLN
1.0000 mg | INTRAMUSCULAR | Status: AC | PRN
  Administered 2013-02-26: 4 mg via INTRAVENOUS

## 2013-02-26 MED ORDER — HYDRALAZINE HCL 50 MG PO TABS
100.0000 mg | ORAL_TABLET | Freq: Three times a day (TID) | ORAL | Status: DC
  Filled 2013-02-26 (×3): qty 2

## 2013-02-26 MED ORDER — MORPHINE SULFATE 2 MG/ML IJ SOLN
2.0000 mg | INTRAMUSCULAR | Status: DC | PRN
  Administered 2013-02-27 (×3): 2 mg via INTRAVENOUS
  Administered 2013-02-28 (×2): 4 mg via INTRAVENOUS
  Filled 2013-02-26: qty 2
  Filled 2013-02-26 (×3): qty 1
  Filled 2013-02-26 (×2): qty 2

## 2013-02-26 MED ORDER — DOCUSATE SODIUM 100 MG PO CAPS
200.0000 mg | ORAL_CAPSULE | Freq: Every day | ORAL | Status: DC
  Administered 2013-03-02 – 2013-03-05 (×3): 200 mg via ORAL
  Filled 2013-02-26 (×4): qty 2

## 2013-02-26 MED ORDER — SODIUM CHLORIDE 0.45 % IV SOLN
INTRAVENOUS | Status: DC
  Administered 2013-02-26: 20 mL/h via INTRAVENOUS
  Administered 2013-02-27 – 2013-03-02 (×3): via INTRAVENOUS

## 2013-02-26 MED ORDER — NICARDIPINE HCL IN NACL 40-0.83 MG/200ML-% IV SOLN
5.0000 mg/h | INTRAVENOUS | Status: DC
  Administered 2013-02-26: 5 mg/h via INTRAVENOUS
  Filled 2013-02-26: qty 200

## 2013-02-26 MED ORDER — BISACODYL 5 MG PO TBEC
10.0000 mg | DELAYED_RELEASE_TABLET | Freq: Every day | ORAL | Status: DC
Start: 2013-02-27 — End: 2013-03-06
  Administered 2013-03-02 – 2013-03-05 (×3): 10 mg via ORAL
  Filled 2013-02-26 (×3): qty 2

## 2013-02-26 MED ORDER — MIDAZOLAM HCL 2 MG/2ML IJ SOLN
2.0000 mg | INTRAMUSCULAR | Status: DC | PRN
  Administered 2013-02-26: 2 mg via INTRAVENOUS
  Filled 2013-02-26: qty 2

## 2013-02-26 MED ORDER — PERFLUTREN LIPID MICROSPHERE
INTRAVENOUS | Status: AC
  Filled 2013-02-26: qty 10

## 2013-02-26 MED ORDER — SODIUM CHLORIDE 0.9 % IV SOLN
INTRAVENOUS | Status: DC
  Administered 2013-02-26: 04:00:00 via INTRAVENOUS

## 2013-02-26 MED ORDER — SODIUM CHLORIDE 0.9 % IR SOLN
Status: DC | PRN
  Administered 2013-02-26: 16:00:00

## 2013-02-26 MED ORDER — CHLORHEXIDINE GLUCONATE 4 % EX LIQD: Freq: Once | CUTANEOUS | Status: DC

## 2013-02-26 MED ORDER — DEXTROSE 5 % IV SOLN
1.5000 g | Freq: Two times a day (BID) | INTRAVENOUS | Status: AC
  Administered 2013-02-27 – 2013-02-28 (×4): 1.5 g via INTRAVENOUS
  Filled 2013-02-26 (×5): qty 1.5

## 2013-02-26 MED ORDER — FENTANYL CITRATE 0.05 MG/ML IJ SOLN: 25.0000 ug | INTRAMUSCULAR | Status: DC | PRN

## 2013-02-26 MED ORDER — ROCURONIUM BROMIDE 100 MG/10ML IV SOLN
INTRAVENOUS | Status: DC | PRN
  Administered 2013-02-26: 100 mg via INTRAVENOUS

## 2013-02-26 MED ORDER — METOLAZONE 2.5 MG PO TABS
2.5000 mg | ORAL_TABLET | Freq: Every day | ORAL | Status: DC
  Administered 2013-02-26: 2.5 mg via ORAL
  Filled 2013-02-26: qty 1

## 2013-02-26 MED ORDER — OXYCODONE HCL 5 MG PO TABS
5.0000 mg | ORAL_TABLET | ORAL | Status: DC | PRN
  Administered 2013-03-04: 10 mg via ORAL
  Filled 2013-02-26: qty 2

## 2013-02-26 MED ORDER — FENTANYL CITRATE 0.05 MG/ML IJ SOLN
INTRAMUSCULAR | Status: AC
  Administered 2013-02-26: 100 ug via INTRAVENOUS
  Filled 2013-02-26: qty 4

## 2013-02-26 MED ORDER — ACETAMINOPHEN 500 MG PO TABS
1000.0000 mg | ORAL_TABLET | Freq: Four times a day (QID) | ORAL | Status: AC
  Administered 2013-03-01 – 2013-03-03 (×6): 1000 mg via ORAL
  Filled 2013-02-26 (×18): qty 2

## 2013-02-26 MED ORDER — DEXTROSE 5 % IV SOLN
1.5000 g | INTRAVENOUS | Status: AC
  Administered 2013-02-26: 1.5 g via INTRAVENOUS
  Filled 2013-02-26: qty 1.5

## 2013-02-26 MED ORDER — SODIUM CHLORIDE 0.9 % IV SOLN: 250.0000 mL | INTRAVENOUS | Status: DC

## 2013-02-26 MED ORDER — ASPIRIN EC 325 MG PO TBEC
325.0000 mg | DELAYED_RELEASE_TABLET | Freq: Every day | ORAL | Status: DC
  Administered 2013-03-02 – 2013-03-06 (×3): 325 mg via ORAL
  Filled 2013-02-26 (×8): qty 1

## 2013-02-26 MED ORDER — ASPIRIN 81 MG PO CHEW
324.0000 mg | CHEWABLE_TABLET | Freq: Every day | ORAL | Status: DC
  Administered 2013-02-27 – 2013-03-05 (×3): 324 mg
  Filled 2013-02-26: qty 1
  Filled 2013-02-26 (×3): qty 4

## 2013-02-26 MED ORDER — LACTATED RINGERS IV SOLN: INTRAVENOUS | Status: DC

## 2013-02-26 MED ORDER — LIDOCAINE HCL (CARDIAC) 20 MG/ML IV SOLN
INTRAVENOUS | Status: AC
  Filled 2013-02-26: qty 5

## 2013-02-26 SURGICAL SUPPLY — 64 items
ADH SKN CLS APL DERMABOND .7 (GAUZE/BANDAGES/DRESSINGS) ×1
APL SKNCLS STERI-STRIP NONHPOA (GAUZE/BANDAGES/DRESSINGS)
BENZOIN TINCTURE PRP APPL 2/3 (GAUZE/BANDAGES/DRESSINGS) IMPLANT
CANISTER SUCTION 2500CC (MISCELLANEOUS) ×2 IMPLANT
CATH CANNON HEMO 15FR 32 (HEMODIALYSIS SUPPLIES) IMPLANT
CATH CANNON HEMO 15FR 32CM (HEMODIALYSIS SUPPLIES) ×2 IMPLANT
CATH THORACIC 28FR (CATHETERS) IMPLANT
CATH THORACIC 28FR RT ANG (CATHETERS) IMPLANT
CATH THORACIC 36FR (CATHETERS) IMPLANT
CATH THORACIC 36FR RT ANG (CATHETERS) IMPLANT
CLOTH BEACON ORANGE TIMEOUT ST (SAFETY) ×2 IMPLANT
CONN ST 1/4X3/8  BEN (MISCELLANEOUS) ×2
CONN ST 1/4X3/8 BEN (MISCELLANEOUS) ×1 IMPLANT
CONT SPEC 4OZ CLIKSEAL STRL BL (MISCELLANEOUS) ×5 IMPLANT
COVER SURGICAL LIGHT HANDLE (MISCELLANEOUS) ×2 IMPLANT
DERMABOND ADVANCED (GAUZE/BANDAGES/DRESSINGS) ×1
DERMABOND ADVANCED .7 DNX12 (GAUZE/BANDAGES/DRESSINGS) ×1 IMPLANT
DRAIN CHANNEL 28F RND 3/8 FF (WOUND CARE) ×3 IMPLANT
DRAPE LAPAROSCOPIC ABDOMINAL (DRAPES) ×2 IMPLANT
ELECT BLADE 4.0 EZ CLEAN MEGAD (MISCELLANEOUS) ×2
ELECT REM PT RETURN 9FT ADLT (ELECTROSURGICAL) ×2
ELECTRODE BLDE 4.0 EZ CLN MEGD (MISCELLANEOUS) IMPLANT
ELECTRODE REM PT RTRN 9FT ADLT (ELECTROSURGICAL) ×1 IMPLANT
GLOVE BIO SURGEON STRL SZ 6.5 (GLOVE) ×8 IMPLANT
GLOVE BIOGEL PI IND STRL 7.0 (GLOVE) IMPLANT
GLOVE BIOGEL PI INDICATOR 7.0 (GLOVE) ×4
GOWN BRE IMP SLV AUR LG STRL (GOWN DISPOSABLE) ×4 IMPLANT
HEMOSTAT POWDER SURGIFOAM 1G (HEMOSTASIS) IMPLANT
KIT BASIN OR (CUSTOM PROCEDURE TRAY) ×2 IMPLANT
KIT ROOM TURNOVER OR (KITS) ×2 IMPLANT
NDL 18GX1X1/2 (RX/OR ONLY) (NEEDLE) IMPLANT
NEEDLE 18GX1X1/2 (RX/OR ONLY) (NEEDLE) ×2 IMPLANT
NS IRRIG 1000ML POUR BTL (IV SOLUTION) ×4 IMPLANT
PACK CHEST (CUSTOM PROCEDURE TRAY) ×2 IMPLANT
PAD ARMBOARD 7.5X6 YLW CONV (MISCELLANEOUS) ×4 IMPLANT
PAD ELECT DEFIB RADIOL ZOLL (MISCELLANEOUS) ×2 IMPLANT
SPONGE GAUZE 4X4 12PLY (GAUZE/BANDAGES/DRESSINGS) ×1 IMPLANT
STRIP CLOSURE SKIN 1/2X4 (GAUZE/BANDAGES/DRESSINGS) ×2 IMPLANT
SUT ETHIBOND 2 0 SH (SUTURE) ×2
SUT ETHIBOND 2 0 SH 36X2 (SUTURE) IMPLANT
SUT ETHILON 3 0 PS 1 (SUTURE) ×1 IMPLANT
SUT PROLENE 4 0 RB 1 (SUTURE) ×2
SUT PROLENE 4-0 RB1 .5 CRCL 36 (SUTURE) IMPLANT
SUT SILK  1 MH (SUTURE) ×2
SUT SILK 1 MH (SUTURE) IMPLANT
SUT SILK 2 0 SH CR/8 (SUTURE) ×1 IMPLANT
SUT VIC AB 0 CTX 18 (SUTURE) ×2 IMPLANT
SUT VIC AB 1 CTX 18 (SUTURE) ×1 IMPLANT
SUT VIC AB 2-0 CT1 27 (SUTURE) ×2
SUT VIC AB 2-0 CT1 TAPERPNT 27 (SUTURE) IMPLANT
SUT VIC AB 2-0 CTX 27 (SUTURE) ×2 IMPLANT
SUT VIC AB 3-0 X1 27 (SUTURE) ×3 IMPLANT
SUT VIC AB 4-0 PS2 27 (SUTURE) ×1 IMPLANT
SWAB COLLECTION DEVICE MRSA (MISCELLANEOUS) IMPLANT
SYR 50ML SLIP (SYRINGE) IMPLANT
SYRINGE 10CC LL (SYRINGE) ×1 IMPLANT
SYSTEM SAHARA CHEST DRAIN ATS (WOUND CARE) ×2 IMPLANT
TAPE CLOTH SURG 4X10 WHT LF (GAUZE/BANDAGES/DRESSINGS) ×1 IMPLANT
TOWEL OR 17X24 6PK STRL BLUE (TOWEL DISPOSABLE) ×3 IMPLANT
TOWEL OR 17X26 10 PK STRL BLUE (TOWEL DISPOSABLE) ×3 IMPLANT
TRAP SPECIMEN MUCOUS 40CC (MISCELLANEOUS) ×6 IMPLANT
TRAY FOLEY CATH 14FRSI W/METER (CATHETERS) ×2 IMPLANT
TUBE ANAEROBIC SPECIMEN COL (MISCELLANEOUS) IMPLANT
WATER STERILE IRR 1000ML POUR (IV SOLUTION) ×4 IMPLANT

## 2013-02-26 NOTE — Anesthesia Preprocedure Evaluation (Signed)
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 Dental   Pulmonary          Cardiovascular hypertension, +CHF Rhythm:Regular     Neuro/Psych    GI/Hepatic   Endo/Other  diabetes  Renal/GU Renal InsufficiencyRenal disease     Musculoskeletal   Abdominal   Peds  Hematology   Anesthesia Other Findings   Reproductive/Obstetrics                           Anesthesia Physical Anesthesia Plan  ASA: IV  Anesthesia Plan: General   Post-op Pain Management:    Induction: Intravenous and Inhalational  Airway Management Planned:   Additional Equipment:   Intra-op Plan:   Post-operative Plan: Post-operative intubation/ventilation  Informed Consent:   Plan Discussed with: Surgeon  Anesthesia Plan Comments:         Anesthesia Quick Evaluation

## 2013-02-26 NOTE — Progress Notes (Signed)
Late entry: Pt. Was placed on CPAP of 8cm H20 via FFM with 3L O2 bled in. Pt. Is tolerating CPAP well at this time. Pt. Was made aware to call anytime during the night if she had any complications with CPAP.

## 2013-02-26 NOTE — Consult Note (Signed)
PULMONARY  / CRITICAL CARE MEDICINE  Name: Gail Chapman MRN: [id number] DOB: 1956-06-04    ADMISSION DATE:  02/19/2013 CONSULTATION DATE:  02/26/13  REFERRING MD :  Dr. Shirlee Latch PRIMARY SERVICE: Cardiology  CHIEF COMPLAINT:  Altered Mental Status  BRIEF PATIENT DESCRIPTION:  57 y/o F with PMH of poorly controlled HTN, DM,  Diastolic CHF, initially admitted to [facility] with a 2-3 month hx of progressive weight gain & dyspnea, diuresed and discharged to Rehab.  Worsened at Rehab and was transferred to [facility] 5/14 for further care.  5/21 had progressive lethargy in the setting of worsened renal failure / antianxiety meds.  Questionable dx of amyloidosis.  PCCM consulted for AMS / Acute Respiratory Failure.    SIGNIFICANT EVENTS / STUDIES:  5/14 - Admit to [facility] with progressive renal fx, worsening swelling / wt gain, dyspnea 5/15 - Ultrafiltration initiated, continued until 5/19 5/20 - RHC >>Marked elevation of RV/LV pressures, Pulmonary HTN, Nml Cardiac output    LINES / TUBES: OETT 5/21>>> L [facility] TLC>>>  CULTURES: 5/14 MRSA PCR>>>neg  ANTIBIOTICS:   HISTORY OF PRESENT ILLNESS:  57 y/o F with PMH of poorly controlled HTN, DM,  Diastolic CHF, initially admitted to [facility] with a 2-3 month hx of progressive weight gain & dyspnea.  Stayed in hospital approximately one week for attempted diuresis and discharged to Rehab with a discharge wt of 208 lbs. She continued to worsen at Rehab and was transferred to [facility] 5/14 for further care.  She underwent ultrafiltration from 5/15-5/19 with limited success and continued to have progressively worsening renal failure.  5/21 was noted to have increased lethargy in the setting of worsened renal failure / antianxiety meds.  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  A fat pad biopsy was inconclusive.  Currently, she is pending myocardial biopsy. She has had elevated Kappa and lambda light chains. PCCM consulted for  AMS / Acute Respiratory Failure.    PAST MEDICAL HISTORY :  Past Medical History  Diagnosis Date  . Diabetes mellitus without complication   . Chronic diastolic CHF (congestive heart failure)     a. 01/2007 MV: No isch/infarct, nl EF;  b. 2012 Cath: "normal" per patient.  Performed in [place], [place] by Dr. Graciela Husbands.  . Pulmonary hypertension     a. on home O2 @ 3lpm 24hrs/day  . HTN (hypertension)     a. Dx in 1993.  Marland Kitchen LVH (left ventricular hypertrophy)   . OSA (obstructive sleep apnea)     a. uses CPAP  . Physical deconditioning    Past Surgical History  Procedure Laterality Date  . Knee surgery    . Carpal tunnel release    . Tubal ligation    . Cholecystectomy    . Mass excision      a. under axilla.   Prior to Admission medications   Medication Sig Start Date End Date Taking? Authorizing Provider  atorvastatin (LIPITOR) 40 MG tablet Take 40 mg by mouth daily.   Yes Historical Provider, MD  docusate sodium (COLACE) 100 MG capsule Take 100 mg by mouth 2 (two) times daily.   Yes Historical Provider, MD  furosemide (LASIX) 40 MG tablet Take 40 mg by mouth daily.   Yes Historical Provider, MD  insulin aspart (NOVOLOG) 100 UNIT/ML injection Inject 4 Units into the skin 3 (three) times daily with meals. Per sliding scale   Yes Historical Provider, MD  insulin detemir (LEVEMIR) 100 UNIT/ML injection Inject 14  Units into the skin at bedtime. Hold if CBG<150   Yes Historical Provider, MD  labetalol (NORMODYNE) 200 MG tablet Take 200 mg by mouth 3 (three) times daily.   Yes Historical Provider, MD  NIFEdipine (PROCARDIA-XL/ADALAT-CC/NIFEDICAL-XL) 30 MG 24 hr tablet Take 60 mg by mouth daily.   Yes Historical Provider, MD  pantoprazole (PROTONIX) 40 MG tablet Take 40 mg by mouth daily.   Yes Historical Provider, MD  promethazine (PHENERGAN) 25 MG suppository Place 25 mg rectally at bedtime.   Yes Historical Provider, MD   Allergies  Allergen Reactions  . Clonidine Derivatives     Hand  itching  . Labetalol     Fatigue   . Vancomycin Rash    FAMILY HISTORY:  Family History  Problem Relation Age of Onset  . Diabetes Mother   . CAD Mother    SOCIAL HISTORY:  reports that she has quit smoking. She does not have any smokeless tobacco history on file. She reports that she does not drink alcohol or use illicit drugs.  REVIEW OF SYSTEMS:  Unable to assess as pt is altered.   SUBJECTIVE:   VITAL SIGNS: Temp:  [97.3 F (36.3 C)-98.9 F (37.2 C)] 97.3 F (36.3 C) (05/21 1239) Pulse Rate:  [62-79] 62 (05/21 1239) Resp:  [12-28] 16 (05/21 1239) BP: (135-197)/(41-87) 135/52 mmHg (05/21 1239) SpO2:  [89 %-100 %] 100 % (05/21 1239) Weight:  [192 lb 10.9 oz (87.4 kg)] 192 lb 10.9 oz (87.4 kg) (05/21 0500)  HEMODYNAMICS: CVP:  [16 mmHg-20 mmHg] 20 mmHg  VENTILATOR SETTINGS:    INTAKE / OUTPUT: Intake/Output     05/20 0701 - 05/21 0700 05/21 0701 - 05/22 0700   P.O. 60 120   I.V. (mL/kg) 292.5 (3.3)    Total Intake(mL/kg) 352.5 (4) 120 (1.4)   Urine (mL/kg/hr) 995 (0.5) 150 (0.2)   Other     Total Output 995 150   Net -642.5 -30          PHYSICAL EXAMINATION: General: chronically ill adult female Neuro:  Lethargic, arouses to name and falls back to sleep, moves all ext spontaneously HEENT:  Mm pink/moist Cardiovascular:  s1s2 rrr, no m/r/g Lungs:  resp's shallow, non-labored, snoring resp's with periods of apnea prior to intubation Abdomen:  Round/soft, bsx4 active Musculoskeletal:  No acute deformities Skin:  Warm/dry  LABS:  Recent Labs Lab 02/19/13 1429 02/19/13 2200 02/19/13 2205 02/20/13 0500  02/21/13 2113  02/23/13 1935 02/24/13 0538 02/25/13 0453 02/25/13 0931 02/25/13 1200 02/26/13 0415 02/26/13 1239  HGB 9.5* 9.4*  --   --   < >  --   < > 9.1* 8.6*  --   --  8.7*  --   --   WBC 6.7 7.9  --   --   < >  --   < > 9.1 7.8  --   --  7.5  --   --   PLT 223 252  --   --   < >  --   < > 249 223  --   --  228  --   --   NA 141  --   --   143  < >  --   < > 135 138 138  --   --  134*  --   K 4.5  --   --  4.3  < >  --   < > 4.7 4.8 4.8  --   --  4.9  --  CL 105  --   --  107  < >  --   < > 100 103 103  --   --  98  --   CO2 28  --   --  28  < >  --   < > 27 26 25   --   --  26  --   GLUCOSE 136*  --   --  166*  < >  --   < > 168* 180* 115*  --   --  140*  --   BUN 33*  --   --  35*  < >  --   < > 47* 51* 55*  --   --  63*  --   CREATININE 2.02* 2.13*  --  2.03*  < >  --   < > 3.26* 3.38* 3.31*  --  3.32* 3.15*  --   CALCIUM 9.0  --   --  8.8  < >  --   < > 8.8 8.7 8.6  --   --  8.8  --   MG  --  2.1  --   --   --   --   --   --   --   --   --   --   --   --   AST  --   --   --  22  --   --   --   --   --   --   --   --   --   --   ALT  --   --   --  25  --   --   --   --   --   --   --   --   --   --   ALKPHOS  --   --   --  150*  --   --   --   --   --   --   --   --   --   --   BILITOT  --   --   --  0.3  --   --   --   --   --   --   --   --   --   --   PROT  --   --   --  6.1  --   --   --   --   --   --   --   --   --   --   ALBUMIN  --   --   --  2.3*  --   --   --   --   --   --   --   --   --   --   APTT  --   --   --   --   --   --   --   --  55*  --   --   --   --   --   INR  --   --   --   --   --   --   --   --  1.07  --   --   --   --   --   TROPONINI  --   --  <0.30  --   --   --   --   --   --   --   --   --   --   --  PROBNP 45515.0*  --   --  57495.0*  --   --   --   --   --   --   --   --   --   --   PHART  --   --   --   --   --  7.361  --   --   --   --  7.383  --   --  7.345*  PCO2ART  --   --   --   --   --  50.9*  --   --   --   --  43.8  --   --  51.8*  PO2ART  --   --   --   --   --  43.0*  --   --   --   --  112.0*  --   --  69.0*  < > = values in this interval not displayed.  Recent Labs Lab 02/25/13 1146 02/25/13 1613 02/25/13 2250 02/26/13 0736 02/26/13 1238  GLUCAP 104* 98 140* 124* 128*    CXR: 5/21 >>>pending  ASSESSMENT / PLAN:  PULMONARY A: Acute Respiratory Failure - in  setting of progressive renal failure OSA - on CPAP at baseline  PAH - noted on RHC, 96/41 (59)  P:   -now intubation for airway protection  -assess abg in 30 minutes -will need cxr once returned from OR -abg & pcxr in am  CARDIOVASCULAR A:  Hypertensive Crisis CHF - with acute decompensation  P:  -CHF mgmt per Advanced Heart Failure Team  RENAL A:   Acute Renal Failure - no hydronephrosis on renal US 5/19 Hyponatremia   P:   -HD per Nephrology -Tunneled HD cath to be placed in OR -f/u bmp in am  GASTROINTESTINAL A:   SUP   P:   -protonix for DVT proph -NPO -place OGT  HEMATOLOGIC A:   Anemia - in setting of chronic illness. No evidence of acute bleeding.   P:  -monitor H/H -consider tx if hgb <7%  INFECTIOUS A:   No evidence of acute infectious process.   P:   -monitor fever curve / wbc  ENDOCRINE A:   DM   P:   -ICU SSI  NEUROLOGIC A:   Acute Encephalopathy - in setting of renal failure  P:   -monitor, likely to improve post HD  Canary Brim, NP-C [facility] Pgr: [phone] or [phone]  I have personally obtained a history, examined the patient, evaluated laboratory and imaging results, formulated the assessment and plan and placed orders.  CRITICAL CARE: The patient is critically ill with multiple organ systems failure and requires high complexity decision making for assessment and support, frequent evaluation and titration of therapies, application of advanced monitoring technologies and extensive interpretation of multiple databases. Critical Care Time devoted to patient care services described in this note is 45 minutes.   02/26/2013, 2:10 PM  Alyson Reedy, M.D. [facility] Pulmonary/Critical Care Medicine. Pager: [phone]. After hours pager: [phone].

## 2013-02-26 NOTE — Transfer of Care (Signed)
Immediate Anesthesia Transfer of Care Note  Patient: Gail Chapman  Procedure(s) Performed: Procedure(s): SUBXYPHOID PERICARDIAL WINDOW (N/A)  Patient Location: SICU  Anesthesia Type:General  Level of Consciousness: Patient remains intubated per anesthesia plan  Airway & Oxygen Therapy: Patient remains intubated per anesthesia plan and Patient placed on Ventilator (see vital sign flow sheet for setting)  Post-op Assessment: Report given to PACU RN and Post -op Vital signs reviewed and stable  Post vital signs: Reviewed and stable  Complications: No apparent anesthesia complications

## 2013-02-26 NOTE — Progress Notes (Signed)
Pt very lethargic.  Husband in the room and concerned about this.  Spoke with Dr Allena Katz about coming to talk to the husband to answer question about the plan.  Also called [name] with HF team and will send ABG with concern for hypoxia.

## 2013-02-26 NOTE — Progress Notes (Signed)
[address]          [place] [address]       [phone]       Gail Chapman [facility] Medical Record #[id number] Date of Birth: 1956/06/09  Referring: No ref. provider found Primary Care: No primary provider on file.  Chief Complaint:    Chief Complaint  Patient presents with  . Shortness of Breath    History of Present Illness:     Patient unresponsive on vent after resp deterioration while attempt to place dialysis cath. She has known progressive renal failure and chF with large pericardial effusion. Asked to see the patient urgently to drain effusion and place dilysis cath.   Current Activity/ Functional Status: Patient is independent with mobility/ambulation, transfers, ADL's, IADL's.   Zubrod Score: At the time of surgery this patient's most appropriate activity status/level should be described as: []  Normal activity, no symptoms []  Symptoms, fully ambulatory []  Symptoms, in bed less than or equal to 50% of the time []  Symptoms, in bed greater than 50% of the time but less than 100% []  Bedridden []  Moribund  Past Medical History  Diagnosis Date  . Diabetes mellitus without complication   . Chronic diastolic CHF (congestive heart failure)     a. 01/2007 MV: No isch/infarct, nl EF;  b. 2012 Cath: "normal" per patient.  Performed in [place], [place] by Dr. Graciela Husbands.  . Pulmonary hypertension     a. on home O2 @ 3lpm 24hrs/day  . HTN (hypertension)     a. Dx in 1993.  Marland Kitchen LVH (left ventricular hypertrophy)   . OSA (obstructive sleep apnea)     a. uses CPAP  . Physical deconditioning     Past Surgical History  Procedure Laterality Date  . Knee surgery    . Carpal tunnel release    . Tubal ligation    . Cholecystectomy    . Mass excision      a. under axilla.    History  Smoking status  . Former Smoker  Smokeless tobacco  . Not on file    History  Alcohol Use No    History   Social History  . Marital Status:  Married    Spouse Name: N/A    Number of Children: N/A  . Years of Education: N/A   Occupational History  . Not on file.   Social History Main Topics  . Smoking status: Former Games developer  . Smokeless tobacco: Not on file  . Alcohol Use: No  . Drug Use: No  . Sexually Active: Not on file   Other Topics Concern  . Not on file   Social History Narrative   Lives in [place] with her husband.  They have 2 grown children.  She is on disability.    Allergies  Allergen Reactions  . Clonidine Derivatives     Hand itching  . Labetalol     Fatigue   . Vancomycin Rash    Current Facility-Administered Medications  Medication Dose Route Frequency Provider Last Rate Last Dose  . 0.9 %  sodium chloride infusion  250 mL Intravenous PRN Ok Anis, NP 10 mL/hr at 02/19/13 1900 250 mL at 02/19/13 1900  . 0.9 %  sodium chloride infusion   Intravenous Continuous Dolores Patty, MD 5 mL/hr at 02/26/13 0408    . acetaminophen (TYLENOL) tablet 650 mg  650 mg Oral  Q4H PRN Ok Anis, NP   650 mg at 02/19/13 2150  . antiseptic oral rinse (BIOTENE) solution 15 mL  15 mL Mouth Rinse q12n4p Dolores Patty, MD   15 mL at 02/24/13 1616  . atorvastatin (LIPITOR) tablet 40 mg  40 mg Oral Daily Ok Anis, NP   40 mg at 02/26/13 1020  . carvedilol (COREG) tablet 25 mg  25 mg Oral BID WC Duke Salvia, MD   25 mg at 02/26/13 1020  . [START ON 02/27/2013] cefUROXime (ZINACEF) 1.5 g in dextrose 5 % 50 mL IVPB  1.5 g Intravenous 60 min Pre-Op Kerin Perna, MD      . chlorhexidine (PERIDEX) 0.12 % solution 15 mL  15 mL Mouth Rinse BID Dolores Patty, MD   15 mL at 02/26/13 1028  . docusate sodium (COLACE) capsule 100 mg  100 mg Oral BID Ok Anis, NP   100 mg at 02/26/13 1020  . DOPamine (INTROPIN) 800 mg in dextrose 5 % 250 mL infusion  3 mcg/kg/min Intravenous Titrated Dolores Patty, MD 4.9 mL/hr at 02/25/13 1317 3 mcg/kg/min at 02/25/13 1317  . etomidate  (AMIDATE) 2 MG/ML injection           . etomidate (AMIDATE) injection 10 mg  10 mg Intravenous Once Jeanella Craze, NP      . fentaNYL (SUBLIMAZE) 0.05 MG/ML injection           . fentaNYL (SUBLIMAZE) injection 100 mcg  100 mcg Intravenous Once Jeanella Craze, NP      . fentaNYL (SUBLIMAZE) injection 25-50 mcg  25-50 mcg Intravenous Q2H PRN Jeanella Craze, NP      . furosemide (LASIX) 250 mg in dextrose 5 % 250 mL infusion  15 mg/hr Intravenous Continuous Amy D Clegg, NP 15 mL/hr at 02/26/13 1023 15 mg/hr at 02/26/13 1023  . hydrALAZINE (APRESOLINE) injection 5 mg  5 mg Intravenous Q2H PRN Ok Anis, NP   5 mg at 02/20/13 0309  . hydrALAZINE (APRESOLINE) tablet 100 mg  100 mg Oral Q8H Amy D Clegg, NP      . insulin aspart (novoLOG) injection 0-15 Units  0-15 Units Subcutaneous TID WC Ok Anis, NP   2 Units at 02/26/13 1018  . insulin aspart (novoLOG) injection 4 Units  4 Units Subcutaneous TID WC Ok Anis, NP   4 Units at 02/26/13 1026  . insulin detemir (LEVEMIR) injection 14 Units  14 Units Subcutaneous QHS Ok Anis, NP   14 Units at 02/25/13 2243  . isosorbide mononitrate (IMDUR) 24 hr tablet 90 mg  90 mg Oral Daily Amy D Clegg, NP   90 mg at 02/26/13 1020  . lidocaine (cardiac) 100 mg/61ml (XYLOCAINE) 20 MG/ML injection 2%           . metolazone (ZAROXOLYN) tablet 2.5 mg  2.5 mg Oral Daily Amy D Clegg, NP   2.5 mg at 02/26/13 1021  . midazolam (VERSED) 2 MG/2ML injection           . midazolam (VERSED) injection 1-2 mg  1-2 mg Intravenous Q2H PRN Jeanella Craze, NP      . midazolam (VERSED) injection 2 mg  2 mg Intravenous Once Jeanella Craze, NP      . niCARdipine (CARDENE-IV) double-strength infusion (0.2 mg/ml)  5 mg/hr Intravenous Continuous Laurey Morale, MD 25 mL/hr at 02/26/13 1000 5 mg/hr at 02/26/13 1000  . ondansetron (ZOFRAN)  injection 4 mg  4 mg Intravenous Q6H PRN Vonna Kotyk K. Allena Katz, MD      . pantoprazole (PROTONIX) EC tablet 40 mg  40 mg Oral  Daily Dolores Patty, MD   40 mg at 02/25/13 2243  . PERFLUTREN LIPID MICROSPHERE injection SUSP           . phenylephrine (NEO-SYNEPHRINE) 20,000 mcg in dextrose 5 % 250 mL infusion  30-200 mcg/min Intravenous To OR Delight Ovens, MD      . prochlorperazine (COMPAZINE) injection 5 mg  5 mg Intravenous Q6H PRN Hartley Barefoot. Allena Katz, MD   5 mg at 02/26/13 1218  . promethazine (PHENERGAN) suppository 25 mg  25 mg Rectal Q8H PRN Dolores Patty, MD   25 mg at 02/21/13 2307  . rocuronium (ZEMURON) 50 MG/5ML injection           . rocuronium (ZEMURON) injection 50 mg  50 mg Intravenous Once Jeanella Craze, NP      . sodium chloride 0.9 % injection 3 mL  3 mL Intravenous Q12H Ok Anis, NP   3 mL at 02/25/13 2244  . sodium chloride 0.9 % injection 3 mL  3 mL Intravenous PRN Ok Anis, NP      . succinylcholine (ANECTINE) 20 MG/ML injection             Prescriptions prior to admission  Medication Sig Dispense Refill  . atorvastatin (LIPITOR) 40 MG tablet Take 40 mg by mouth daily.      Marland Kitchen docusate sodium (COLACE) 100 MG capsule Take 100 mg by mouth 2 (two) times daily.      . furosemide (LASIX) 40 MG tablet Take 40 mg by mouth daily.      . insulin aspart (NOVOLOG) 100 UNIT/ML injection Inject 4 Units into the skin 3 (three) times daily with meals. Per sliding scale      . insulin detemir (LEVEMIR) 100 UNIT/ML injection Inject 14 Units into the skin at bedtime. Hold if CBG<150      . labetalol (NORMODYNE) 200 MG tablet Take 200 mg by mouth 3 (three) times daily.      Marland Kitchen NIFEdipine (PROCARDIA-XL/ADALAT-CC/NIFEDICAL-XL) 30 MG 24 hr tablet Take 60 mg by mouth daily.      . pantoprazole (PROTONIX) 40 MG tablet Take 40 mg by mouth daily.      . promethazine (PHENERGAN) 25 MG suppository Place 25 mg rectally at bedtime.        Family History  Problem Relation Age of Onset  . Diabetes Mother   . CAD Mother      Review of Systems:  Chart reviewed unable to obtain from patient      Cardiac Review of Systems: Y or N  Chest Pain [    ]  Resting SOB [   ] Exertional SOB  [  ]  Orthopnea [  ]   Pedal Edema [   ]    Palpitations [  ] Syncope  [  ]   Presyncope [   ]  General Review of Systems: [Y] = yes [  ]=no Constitional: recent weight change [  ]; anorexia [  ]; fatigue [  ]; nausea [  ]; night sweats [  ]; fever [  ]; or chills [  ]  Dental: poor dentition[  ]; Last Dentist visit:   Eye : blurred vision [  ]; diplopia [   ]; vision changes [  ];  Amaurosis fugax[  ]; Resp: cough [  ];  wheezing[  ];  hemoptysis[  ]; shortness of breath[  ]; paroxysmal nocturnal dyspnea[  ]; dyspnea on exertion[  ]; or orthopnea[  ];  GI:  gallstones[  ], vomiting[  ];  dysphagia[  ]; melena[  ];  hematochezia [  ]; heartburn[  ];   Hx of  Colonoscopy[  ]; GU: kidney stones [  ]; hematuria[  ];   dysuria [  ];  nocturia[  ];  history of     obstruction [  ]; urinary frequency [  ]             Skin: rash, swelling[  ];, hair loss[  ];  peripheral edema[  ];  or itching[  ]; Musculosketetal: myalgias[  ];  joint swelling[  ];  joint erythema[  ];  joint pain[  ];  back pain[  ];  Heme/Lymph: bruising[  ];  bleeding[  ];  anemia[  ];  Neuro: TIA[  ];  headaches[  ];  stroke[  ];  vertigo[  ];  seizures[  ];   paresthesias[  ];  difficulty walking[  ];  Psych:depression[  ]; anxiety[  ];  Endocrine: diabetes[  ];  thyroid dysfunction[  ];  Immunizations: Flu [  ]; Pneumococcal[  ];  Other:  Physical Exam: BP 149/56  Pulse 66  Temp(Src) 97.3 F (36.3 C) (Oral)  Resp 14  Ht 5' 1.5" (1.562 m)  Wt 192 lb 10.9 oz (87.4 kg)  BMI 35.82 kg/m2  SpO2 100%  General appearance: sedated on vent Neurologic: unknown Heart: regular rate and rhythm, S1, S2 normal, no murmur, click, rub or gallop Lungs: diminished breath sounds bilaterally Abdomen: obese, no masses Extremities: Homans sign is negative, no sign of DVT and no edema,  redness or tenderness in the calves or thighs Scar rt ij from previous dialysis cath  Diagnostic Studies & Laboratory data:     Recent Radiology Findings:  Dg Chest 2 View  02/19/2013   *RADIOLOGY REPORT*  Clinical Data: Chest pain.  Short of breath.  Diabetic.  CHF.  CHEST - 2 VIEW  Comparison: None.  Findings: Technically suboptimal examination.  This is due to both portable technique and obese body habitus.  The pannus projects over the inferior chest.  The cardiopericardial silhouette appears enlarged.  Some this may represent pericardial fat pad.  Lung parenchyma is difficult to evaluate.   On the lateral view, there are small bilateral pleural effusions and thickening of the fissures.  The lateral view suggests a moderate pulmonary edema. Lateral view is actually more diagnostic on the frontal view.  IMPRESSION:  1.  Markedly suboptimal study due to obese habitus and technique. 2.  Overall, the appearance of the chest suggests moderate pulmonary edema and CHF.   Original Report Authenticated By: Andreas Newport, M.D.   US Renal  02/24/2013   *RADIOLOGY REPORT*  Clinical Data: History of renal failure, diabetes, hypertension  RENAL/URINARY TRACT ULTRASOUND COMPLETE  Comparison:  None.  Findings:  Right Kidney:  No hydronephrosis is seen.  The right kidney measures 10.4 cm sagittally.  Left Kidney:  No hydronephrosis is noted.  The left kidney measures 10.5 cm.  Bladder:  The urinary bladder is decompressed with a Foley catheter present.  Bilateral pleural effusions are present  and there is a small amount of ascites.  IMPRESSION:  1.  No hydronephrosis. 2.  Bilateral pleural effusions. 3.  Small amount of ascites. 4.  The urinary bladder is decompressed with a Foley catheter present.   Original Report Authenticated By: Dwyane Dee, M.D.   Ct Biopsy  02/24/2013   *RADIOLOGY REPORT*  Clinical data:    amyloid  CT-GUIDED CORE BIOPSY  Technique and findings: The procedure, risks (including but not limited  to bleeding, infection, organ damage), benefits, and alternatives were explained to the patient.  Questions regarding the procedure were encouraged and answered.  The patient understands and consents to the procedure.Select axial scans through the lower abdomen were performed and an appropriate skin entry site was determined.  Site was marked, prepped with Betadine, draped in usual sterile fashion, infiltrated locally with 1% lidocaine.  Intravenous Fentanyl and Versed were administered as conscious sedation during continuous cardiorespiratory monitoring by the radiology RN, with a total moderate sedation time of six minutes.  Under CT fluoroscopic guidance, a 17 gauge trocar needle was advanced into the abdominal wall fat pad in the right lower quadrant.  Once needle tip position was confirmed, coaxial 18-gauge core biopsy samples were obtained, submitted in formalin to surgical pathology.  The guide needle was removed.  Fluoroscopy time: 2 seconds  IMPRESSION: Technically successful abdominal fat pad biopsy under CT.   Original Report Authenticated By: D. Andria Rhein, MD   Dg Chest Port 1 View  02/20/2013   *RADIOLOGY REPORT*  Clinical Data: Line placement  PORTABLE CHEST - 1 VIEW  Comparison: Yesterday  Findings: New left subclavian vein central venous catheter has been placed with its tip in the lower SVC.  No evidence of pneumothorax. Severe cardiomegaly persists.  No pneumothorax.  Increasing vascular congestion.  Low volumes persist. Stable right internal jugular central venous catheter.  IMPRESSION: New left subclavian vein central venous catheter placement with its tip in the mid SVC and no pneumothorax.  Vascular congestion.   Original Report Authenticated By: Jolaine Click, M.D.   Dg Chest Portable 1 View  02/19/2013   *RADIOLOGY REPORT*  Clinical Data: Right IJ line placement.  PORTABLE CHEST - 1 VIEW  Comparison: 02/19/2013  Findings: Right jugular central line has been placed.  The catheter tip is  in the region of the lower SVC.  Again noted is marked enlargement of the heart. Prominent interstitial markings could represent interstitial edema or vascular congestion.  Cannot exclude a right pleural effusion.  IMPRESSION: Jugular central line in the lower SVC region. Negative for a pneumothorax.  Stable cardiomegaly.   Original Report Authenticated By: Richarda Overlie, M.D.    Recent Lab Findings: Lab Results  Component Value Date   WBC 7.5 02/25/2013   HGB 8.7* 02/25/2013   HCT 26.9* 02/25/2013   PLT 228 02/25/2013   GLUCOSE 140* 02/26/2013   ALT 25 02/20/2013   AST 22 02/20/2013   NA 134* 02/26/2013   K 4.9 02/26/2013   CL 98 02/26/2013   CREATININE 3.15* 02/26/2013   BUN 63* 02/26/2013   CO2 26 02/26/2013   TSH 1.681 02/19/2013   INR 1.07 02/24/2013      Assessment / Plan:     Plan pericardial window and placement of dialysis cath Patient unresponsive, I have discussed with the patients husband. The goals risks and alternatives of the planned surgical procedure pericardial window,  subphyiod   have been discussed with the patient in detail. The risks of the procedure including death, infection,  stroke, myocardial infarction, bleeding, blood transfusion have all been discussed specifically.  I have quoted Gail Chapman a 10 % of perioperative mortality and a complication rate as high as 25 %. The patient's questions have been answered.Gail Chapman is willing  to proceed with the planned procedure.       Delight Ovens MD  Beeper [phone] Office [phone] 02/26/2013 2:40 PM

## 2013-02-26 NOTE — Progress Notes (Signed)
Echocardiogram Echocardiogram Transesophageal has been performed.  Gail Chapman 02/26/2013, 4:17 PM

## 2013-02-26 NOTE — Addendum Note (Signed)
Addendum created 02/26/13 1840 by Jerilee Hoh, CRNA   Modules edited: Anesthesia Events

## 2013-02-26 NOTE — Procedures (Signed)
Intubation Procedure Note Gail Chapman [id number] [birth year]  Procedure: Intubation Indications: Airway protection and maintenance  Procedure Details Consent: Risks of procedure as well as the alternatives and risks of each were explained to the (patient/caregiver).  Consent for procedure obtained. Time Out: Verified patient identification, verified procedure, site/side was marked, verified correct patient position, special equipment/implants available, medications/allergies/relevent history reviewed, required imaging and test results available.  Performed  Maximum sterile technique was used including antiseptics, gloves and hand hygiene.  MAC    Evaluation Hemodynamic Status: BP stable throughout; O2 sats: stable throughout Patient's Current Condition: stable Complications: No apparent complications Patient did tolerate procedure well. Chest X-ray ordered to verify placement.  CXR: pending.   Gail Chapman 02/26/2013

## 2013-02-26 NOTE — Brief Op Note (Addendum)
02/19/2013 - 02/26/2013  4:35 PM  PATIENT:  Gail Chapman  57 y.o. female  PRE-OPERATIVE DIAGNOSIS:  Pericardial effusion, renal faliure  POST-OPERATIVE DIAGNOSIS:  Pericardial effusion, renal failure  PROCEDURE:    SUBXYPHOID PERICARDIAL WINDOW drainage of pericardial effusion  MYOCARDIAL BIOPSY  PLACEMENT OF LEFT IJ DIALYSIS CATHETER with [place] and fluro guidance   SURGEON:  Delight Ovens, MD  ASSISTANT: Coral Ceo, PA-C  ANESTHESIA:   general  SPECIMEN:  Source of Specimen:  Pericardial fluid, pericardium, myocardium  DISPOSITION OF SPECIMEN:  Pathology  FINDINGS: 600 ml straw colored fluid drained from pericardium  DRAINS: 28 Blake drain and left ij tunneled dialysis catheter   PATIENT CONDITION:  ICU - intubated and hemodynamically stable.

## 2013-02-26 NOTE — Consult Note (Signed)
Reason for Consult: Acute on chronic renal failure Referring Physician: Dr. Marca Ancona- Cardiology  HPI:  57 y/o African American woman with a history of HTN-unknown duration, DM without history of retinopathy, diastolic HF and PAH. She follows up with cardiology the [facility] area and has struggled with blood pressure control and recurrent CHF over the years.  The last 2-3 months have been marked by progressive weight gain with associated dyspnea on exertion/orthopnea/abdominal girth increase that led to hospitalization at [facility] for about a week resulting in limited diuresis and eventual admission to a local rehabilitation facility. At rehabilitation, she continued to worsen forcing her to leave. He then presented to [facility] room for further care.  Since her admission here, aggressive diuresis was attempted with minimal improvement of urine output and ultrafiltration therefore started (done from 5/15 to 5/19- DC due to worsening renal function). 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 Plans are noted for myocardial biopsy for further evaluation of amyloid. She has had elevated Kappa and lambda light chains.   02/19/2013  02/20/2013  02/21/2013 16:15 02/22/2013 17:00 02/23/2013 19:35 02/24/2013 05:38 02/25/2013 04:53 02/26/2013 04:15  BUN 33 (H) 35 (H) 35 (H) 40 (H) 47 (H) 51 (H) 55 (H) 63 (H)  Creatinine 2.02 (H) 2.03 (H) 2.51 (H) 2.75 (H) 3.26 (H) 3.38 (H) 3.31 (H) 3.15 (H)   She has been on furosemide intravenously overnight with marginal urine output. Pericardial effusion and diastolic dysfunction noted from 2-D echo. She had right heart catheterization on 02/25/2013, no documented contrast exposure, no NSAID use and no recently nephrotoxic antibiotics.  Past Medical History  Diagnosis Date  . Diabetes mellitus without complication   . Chronic diastolic CHF (congestive heart failure)      a. 01/2007 MV: No isch/infarct, nl EF;  b. 2012 Cath: "normal" per patient.  [place] Graciela Husbands.  . Pulmonary hypertension     a. on home O2 @ 3lpm 24hrs/day  . HTN (hypertension)     a. Dx in 1993.  Marland Kitchen LVH (left ventricular hypertrophy)   . OSA (obstructive sleep apnea)     a. uses CPAP  . Physical deconditioning     Past Surgical History  Procedure Laterality Date  . Knee surgery    . Carpal tunnel release    . Tubal ligation    . Cholecystectomy    . Mass excision      a. under axilla.    Family History  Problem Relation Age of Onset  . Diabetes Mother   . CAD Mother     Social History:  reports that she has quit smoking. She does not have any smokeless tobacco history on file. She reports that she does not drink alcohol or use illicit drugs.  Allergies:  Allergies  Allergen Reactions  . Clonidine Derivatives     Hand itching  . Labetalol     Fatigue   . Vancomycin Rash    Medications:  Scheduled: . antiseptic oral rinse  15 mL Mouth Rinse q12n4p  . atorvastatin  40 mg Oral Daily  . carvedilol  25 mg Oral BID WC  . chlorhexidine  15 mL Mouth Rinse BID  . docusate sodium  100 mg Oral BID  . heparin  5,000 Units Subcutaneous Q8H  . hydrALAZINE  100 mg Oral Q8H  . insulin aspart  0-15 Units Subcutaneous TID WC  . insulin aspart  4 Units Subcutaneous TID WC  . insulin detemir  14 Units Subcutaneous QHS  . isosorbide mononitrate  90 mg Oral Daily  . metolazone  2.5 mg Oral Daily  . pantoprazole  40 mg Oral Daily  . sodium chloride  3 mL Intravenous Q12H    Results for orders placed during the hospital encounter of 02/19/13 (from the past 48 hour(s))  CARBOXYHEMOGLOBIN     Status: Abnormal   Collection Time    02/24/13 11:30 AM      Result Value Range   Total hemoglobin 7.9 (*) 12.0 - 16.0 g/dL   O2 Saturation 16.1     Carboxyhemoglobin 1.4  0.5 - 1.5 %   Methemoglobin 0.9  0.0 - 1.5 %  GLUCOSE, CAPILLARY     Status: Abnormal    Collection Time    02/24/13  4:15 PM      Result Value Range   Glucose-Capillary 115 (*) 70 - 99 mg/dL  GLUCOSE, CAPILLARY     Status: Abnormal   Collection Time    02/24/13  8:07 PM      Result Value Range   Glucose-Capillary 169 (*) 70 - 99 mg/dL  CARBOXYHEMOGLOBIN     Status: Abnormal   Collection Time    02/25/13  4:49 AM      Result Value Range   Total hemoglobin 8.3 (*) 12.0 - 16.0 g/dL   O2 Saturation 09.6     Carboxyhemoglobin 1.9 (*) 0.5 - 1.5 %   Methemoglobin 1.6 (*) 0.0 - 1.5 %  BASIC METABOLIC PANEL     Status: Abnormal   Collection Time    02/25/13  4:53 AM      Result Value Range   Sodium 138  135 - 145 mEq/L   Potassium 4.8  3.5 - 5.1 mEq/L   Chloride 103  96 - 112 mEq/L   CO2 25  19 - 32 mEq/L   Glucose, Bld 115 (*) 70 - 99 mg/dL   BUN 55 (*) 6 - 23 mg/dL   Creatinine, Ser 0.45 (*) 0.50 - 1.10 mg/dL   Calcium 8.6  8.4 - 40.9 mg/dL   GFR calc non Af Amer 15 (*) >90 mL/min   GFR calc Af Amer 17 (*) >90 mL/min   Comment:            The eGFR has been calculated     using the CKD EPI equation.     This calculation has not been     validated in all clinical     situations.     eGFR's persistently     <90 mL/min signify     possible Chronic Kidney Disease.  GLUCOSE, CAPILLARY     Status: Abnormal   Collection Time    02/25/13  7:32 AM      Result Value Range   Glucose-Capillary 105 (*) 70 - 99 mg/dL  POCT I-STAT 3, BLOOD GAS (G3+)     Status: Abnormal   Collection Time    02/25/13  9:31 AM      Result Value Range   pH, Arterial 7.383  7.350 - 7.450   pCO2 arterial 43.8  35.0 - 45.0 mmHg   pO2, Arterial 112.0 (*) 80.0 - 100.0 mmHg   Bicarbonate 26.1 (*) 20.0 - 24.0 mEq/L   TCO2 27  0 - 100 mmol/L   O2 Saturation 98.0     Acid-Base Excess 1.0  0.0 - 2.0 mmol/L   Sample type ARTERIAL  POCT I-STAT 3, BLOOD GAS (G3P V)     Status: Abnormal   Collection Time    02/25/13  9:37 AM      Result Value Range   pH, Ven 7.333 (*) 7.250 - 7.300   pCO2, Ven  50.1 (*) 45.0 - 50.0 mmHg   pO2, Ven 38.0  30.0 - 45.0 mmHg   Bicarbonate 26.6 (*) 20.0 - 24.0 mEq/L   TCO2 28  0 - 100 mmol/L   O2 Saturation 68.0     Sample type VENOUS     Comment NOTIFIED PHYSICIAN    POCT I-STAT 3, BLOOD GAS (G3P V)     Status: Abnormal   Collection Time    02/25/13  9:37 AM      Result Value Range   pH, Ven 7.328 (*) 7.250 - 7.300   pCO2, Ven 48.2  45.0 - 50.0 mmHg   pO2, Ven 41.0  30.0 - 45.0 mmHg   Bicarbonate 25.3 (*) 20.0 - 24.0 mEq/L   TCO2 27  0 - 100 mmol/L   O2 Saturation 71.0     Acid-base deficit 1.0  0.0 - 2.0 mmol/L   Sample type VENOUS    GLUCOSE, CAPILLARY     Status: Abnormal   Collection Time    02/25/13 10:07 AM      Result Value Range   Glucose-Capillary 100 (*) 70 - 99 mg/dL  GLUCOSE, CAPILLARY     Status: Abnormal   Collection Time    02/25/13 11:46 AM      Result Value Range   Glucose-Capillary 104 (*) 70 - 99 mg/dL  CBC     Status: Abnormal   Collection Time    02/25/13 12:00 PM      Result Value Range   WBC 7.5  4.0 - 10.5 K/uL   RBC 3.17 (*) 3.87 - 5.11 MIL/uL   Hemoglobin 8.7 (*) 12.0 - 15.0 g/dL   HCT 16.1 (*) 09.6 - 04.5 %   MCV 84.9  78.0 - 100.0 fL   MCH 27.4  26.0 - 34.0 pg   MCHC 32.3  30.0 - 36.0 g/dL   RDW 40.9 (*) 81.1 - 91.4 %   Platelets 228  150 - 400 K/uL  CREATININE, SERUM     Status: Abnormal   Collection Time    02/25/13 12:00 PM      Result Value Range   Creatinine, Ser 3.32 (*) 0.50 - 1.10 mg/dL   GFR calc non Af Amer 15 (*) >90 mL/min   GFR calc Af Amer 17 (*) >90 mL/min   Comment:            The eGFR has been calculated     using the CKD EPI equation.     This calculation has not been     validated in all clinical     situations.     eGFR's persistently     <90 mL/min signify     possible Chronic Kidney Disease.  GLUCOSE, CAPILLARY     Status: None   Collection Time    02/25/13  4:13 PM      Result Value Range   Glucose-Capillary 98  70 - 99 mg/dL  GLUCOSE, CAPILLARY     Status: Abnormal    Collection Time    02/25/13 10:50 PM      Result Value Range   Glucose-Capillary 140 (*) 70 - 99 mg/dL  BASIC METABOLIC PANEL     Status: Abnormal   Collection  Time    02/26/13  4:15 AM      Result Value Range   Sodium 134 (*) 135 - 145 mEq/L   Potassium 4.9  3.5 - 5.1 mEq/L   Chloride 98  96 - 112 mEq/L   CO2 26  19 - 32 mEq/L   Glucose, Bld 140 (*) 70 - 99 mg/dL   BUN 63 (*) 6 - 23 mg/dL   Creatinine, Ser 4.54 (*) 0.50 - 1.10 mg/dL   Calcium 8.8  8.4 - 09.8 mg/dL   GFR calc non Af Amer 16 (*) >90 mL/min   GFR calc Af Amer 18 (*) >90 mL/min   Comment:            The eGFR has been calculated     using the CKD EPI equation.     This calculation has not been     validated in all clinical     situations.     eGFR's persistently     <90 mL/min signify     possible Chronic Kidney Disease.  AMMONIA     Status: None   Collection Time    02/26/13  4:15 AM      Result Value Range   Ammonia 23  11 - 60 umol/L  GLUCOSE, CAPILLARY     Status: Abnormal   Collection Time    02/26/13  7:36 AM      Result Value Range   Glucose-Capillary 124 (*) 70 - 99 mg/dL  CARBOXYHEMOGLOBIN     Status: Abnormal   Collection Time    02/26/13  8:20 AM      Result Value Range   Total hemoglobin 9.8 (*) 12.0 - 16.0 g/dL   O2 Saturation 11.9     Carboxyhemoglobin 1.4  0.5 - 1.5 %   Methemoglobin 1.0  0.0 - 1.5 %    Ct Biopsy  02/24/2013   *RADIOLOGY REPORT*  Clinical data:    amyloid  CT-GUIDED CORE BIOPSY  Technique and findings: The procedure, risks (including but not limited to bleeding, infection, organ damage), benefits, and alternatives were explained to the patient.  Questions regarding the procedure were encouraged and answered.  The patient understands and consents to the procedure.Select axial scans through the lower abdomen were performed and an appropriate skin entry site was determined.  Site was marked, prepped with Betadine, draped in usual sterile fashion, infiltrated locally with 1%  lidocaine.  Intravenous Fentanyl and Versed were administered as conscious sedation during continuous cardiorespiratory monitoring by the radiology RN, with a total moderate sedation time of six minutes.  Under CT fluoroscopic guidance, a 17 gauge trocar needle was advanced into the abdominal wall fat pad in the right lower quadrant.  Once needle tip position was confirmed, coaxial 18-gauge core biopsy samples were obtained, submitted in formalin to surgical pathology.  The guide needle was removed.  Fluoroscopy time: 2 seconds  IMPRESSION: Technically successful abdominal fat pad biopsy under CT.   Original Report Authenticated By: D. Andria Rhein, MD    Review of Systems  Constitutional: Positive for malaise/fatigue. Negative for fever, chills, weight loss and diaphoresis.  HENT: Negative.  Negative for neck pain.   Eyes: Negative.   Respiratory: Positive for cough. Negative for hemoptysis, sputum production, shortness of breath and wheezing.   Cardiovascular: Positive for orthopnea and leg swelling. Negative for chest pain, palpitations, claudication and PND.  Gastrointestinal: Positive for nausea and vomiting. Negative for heartburn, abdominal pain, diarrhea and blood in stool.  Musculoskeletal: Positive  for back pain. Negative for myalgias, joint pain and falls.  Skin: Negative.   Neurological: Positive for weakness. Negative for dizziness, tingling, tremors and focal weakness.  Psychiatric/Behavioral: Negative for hallucinations and substance abuse. The patient is nervous/anxious.    Blood pressure 136/47, pulse 70, temperature 97.8 F (36.6 C), temperature source Oral, resp. rate 21, height 5' 1.5" (1.562 m), weight 87.4 kg (192 lb 10.9 oz), SpO2 99.00%. Physical Exam  Nursing note and vitals reviewed. Constitutional: She appears well-developed and well-nourished. No distress.  HENT:  Head: Normocephalic and atraumatic.  Nose: Nose normal.  Mouth/Throat: Oropharynx is clear and moist. No  oropharyngeal exudate.  Eyes: Conjunctivae are normal. Pupils are equal, round, and reactive to light. No scleral icterus.  Neck: Normal range of motion. Neck supple. JVD present. No tracheal deviation present. No thyromegaly present.  JVP Elevated 9 cm  Cardiovascular: Normal rate and regular rhythm.  Exam reveals no gallop and no friction rub.   No murmur heard. Distant heart sounds S1 and S2  Respiratory: Effort normal. No respiratory distress. She has no wheezes. She has rales. She exhibits no tenderness.  GI: Soft. Bowel sounds are normal. She exhibits no distension. There is no tenderness. There is no rebound and no guarding.  Musculoskeletal: She exhibits edema. She exhibits no tenderness.  3+ edema over lower extremities, 2+ over upper extremity  Lymphadenopathy:    She has no cervical adenopathy.  Neurological:  Somnolent and vaguely oriented to place and person.  Skin: Skin is warm and dry. No rash noted. No erythema.  Psychiatric: She has a normal mood and affect.    Assessment/Plan:  1. Acute renal failure chronic kidney disease: Suspect prominent cardiorenal component to this-unfortunately has been failure trials of diuretic and isolated ultrafiltration. Given worsening labs and continued volume problems, would plan to do dialysis in the next 24 hours. Should be able to tolerate conventional hemodialysis. No acute hemodialysis needs noted at this time. Will check urine studies-renal ultrasound negative. 2. Diastolic dysfunction/congestive heart failure: Considering diagnosis of amyloidosis-based on my evaluation of her light chain ratio, this appears to be consistent with chronic kidney disease rather than a light chain disease such as amyloid-she also has normal renal size bilaterally that goes against amyloidosis. May be helpful to getting a pericardial window to drain her pericardial effusion and allow for better cardiac output. 3. Anemia: Possibly anemia of chronic disease  including CK D., iron stores and consider ESA therapy. 4. Respiratory acidosis: Avoid sedating medications, plan on ultrafiltration with hemodialysis within the next 24 hours.   Shamere Dilworth K. 02/26/2013, 10:39 AM

## 2013-02-26 NOTE — Progress Notes (Addendum)
Advanced Heart Failure Rounding Note   Subjective:    57 y/o female with a h/o HTN, DM, diastolic HF and PAH. Admitted 5/14 with HTN crisis (BP 260/120) and massive volume overload.   Over the weekend she continued on UF. Cr remains up.  UF stopped 02/24/13.   Yesterday she was started on lasix drip at 10 mg per hour and dopamine 3 mcg. Weight unchanged. Overall weight down 21 pounds.  CVP  17-18 today.   Complains of fatigue. Denies SOB   Urine IFE with light chains concerning for amyloid.  02/24/13 Fat Pad biopsy negative but limited   5/19/4 Renal US not hydronephrosis  RHC 02/25/13 RA = 20  RV = 94/6/23  PA = 96/41 (59)  PCW = 24  Ao = 167/70 (106)  LV = 164/17/28  Fick cardiac output/index = 7.8/4.2  PVR = 4.4 [place]  SVR = 880  FA sat = 98%  PA sat = 68%, 71%  There was no signficant gradient across the aortic valve on pullback.  1. Markedly elevated RV and LV pressures without interaction suggestive of restrictive physiology  2. Pulmonary venous HTN.  3. Normal cardiac output      Creatinine 2.07>3.02>3.26>3.38>3.31>3.15 CO-OX 84>pending    Objective:    Vital Signs:   Temp:  [98.7 F (37.1 C)-99.3 F (37.4 C)] 98.7 F (37.1 C) (05/21 0000) Pulse Rate:  [66-79] 66 (05/21 0600) Resp:  [12-28] 14 (05/21 0600) BP: (144-197)/(41-87) 182/70 mmHg (05/21 0629) SpO2:  [89 %-100 %] 97 % (05/21 0600) Weight:  [192 lb 10.9 oz (87.4 kg)] 192 lb 10.9 oz (87.4 kg) (05/21 0500)    Weight change: Filed Weights   02/24/13 0400 02/25/13 0500 02/26/13 0500  Weight: 191 lb 9.3 oz (86.9 kg) 192 lb 10.9 oz (87.4 kg) 192 lb 10.9 oz (87.4 kg)    Intake/Output:   Intake/Output Summary (Last 24 hours) at 02/26/13 5784 Last data filed at 02/26/13 0400  Gross per 24 hour  Intake 233.45 ml  Output    755 ml  Net -521.55 ml     Physical Exam: CVP 17-18 General: Pleasant Psych: Normal affect.  Neuro: Alert and oriented X 3. Moves all extremities spontaneously.   HEENT: Normal.  Rt IJ area small hematoma.  Lt subclavian UF catheter Neck: Supple without bruits. JVP jaw. Carotids 2+ bilaterally without bruits  Lungs: Resp regular and unlabored, crackles in in bases on 2 liters [place] Heart: RRR no s3, + s4, 2/6 TR  Abdomen: protuberant, firm and distended. nontender, BS + x 4.  Extremities: No clubbing, cyanosis. 2-3+ bilat LEE   Telemetry: SR 60-70s  Labs: Basic Metabolic Panel:  Recent Labs Lab 02/19/13 1429 02/19/13 2200  02/23/13 0510 02/23/13 1935 02/24/13 0538 02/25/13 0453 02/25/13 1200 02/26/13 0415  NA 141  --   < > 138 135 138 138  --  134*  K 4.5  --   < > 4.6 4.7 4.8 4.8  --  4.9  CL 105  --   < > 102 100 103 103  --  98  CO2 28  --   < > 28 27 26 25   --  26  GLUCOSE 136*  --   < > 99 168* 180* 115*  --  140*  BUN 33*  --   < > 43* 47* 51* 55*  --  63*  CREATININE 2.02* 2.13*  < > 3.02* 3.26* 3.38* 3.31* 3.32* 3.15*  CALCIUM 9.0  --   < >  8.7 8.8 8.7 8.6  --  8.8  MG  --  2.1  --   --   --   --   --   --   --   < > = values in this interval not displayed.  Liver Function Tests:  Recent Labs Lab 02/20/13 0500  AST 22  ALT 25  ALKPHOS 150*  BILITOT 0.3  PROT 6.1  ALBUMIN 2.3*   No results found for this basename: LIPASE, AMYLASE,  in the last 168 hours  Recent Labs Lab 02/26/13 0415  AMMONIA 23    CBC:  Recent Labs Lab 02/22/13 1700 02/23/13 0510 02/23/13 1935 02/24/13 0538 02/25/13 1200  WBC 7.7 7.2 9.1 7.8 7.5  HGB 9.1* 8.6* 9.1* 8.6* 8.7*  HCT 28.8* 26.8* 28.6* 26.9* 26.9*  MCV 85.5 85.4 85.1 85.7 84.9  PLT 243 227 249 223 228    Cardiac Enzymes:  Recent Labs Lab 02/19/13 2205  TROPONINI <0.30    BNP: BNP (last 3 results)  Recent Labs  02/19/13 1429 02/20/13 0500  PROBNP 45515.0* 57495.0*     Other results:   Imaging: US Renal  02/24/2013   *RADIOLOGY REPORT*  Clinical Data: History of renal failure, diabetes, hypertension  RENAL/URINARY TRACT ULTRASOUND COMPLETE   Comparison:  None.  Findings:  Right Kidney:  No hydronephrosis is seen.  The right kidney measures 10.4 cm sagittally.  Left Kidney:  No hydronephrosis is noted.  The left kidney measures 10.5 cm.  Bladder:  The urinary bladder is decompressed with a Foley catheter present.  Bilateral pleural effusions are present and there is a small amount of ascites.  IMPRESSION:  1.  No hydronephrosis. 2.  Bilateral pleural effusions. 3.  Small amount of ascites. 4.  The urinary bladder is decompressed with a Foley catheter present.   Original Report Authenticated By: Dwyane Dee, M.D.   Ct Biopsy  02/24/2013   *RADIOLOGY REPORT*  Clinical data:    amyloid  CT-GUIDED CORE BIOPSY  Technique and findings: The procedure, risks (including but not limited to bleeding, infection, organ damage), benefits, and alternatives were explained to the patient.  Questions regarding the procedure were encouraged and answered.  The patient understands and consents to the procedure.Select axial scans through the lower abdomen were performed and an appropriate skin entry site was determined.  Site was marked, prepped with Betadine, draped in usual sterile fashion, infiltrated locally with 1% lidocaine.  Intravenous Fentanyl and Versed were administered as conscious sedation during continuous cardiorespiratory monitoring by the radiology RN, with a total moderate sedation time of six minutes.  Under CT fluoroscopic guidance, a 17 gauge trocar needle was advanced into the abdominal wall fat pad in the right lower quadrant.  Once needle tip position was confirmed, coaxial 18-gauge core biopsy samples were obtained, submitted in formalin to surgical pathology.  The guide needle was removed.  Fluoroscopy time: 2 seconds  IMPRESSION: Technically successful abdominal fat pad biopsy under CT.   Original Report Authenticated By: D. Andria Rhein, MD     Medications:     Scheduled Medications: . antiseptic oral rinse  15 mL Mouth Rinse q12n4p  .  atorvastatin  40 mg Oral Daily  . carvedilol  25 mg Oral BID WC  . chlorhexidine  15 mL Mouth Rinse BID  . docusate sodium  100 mg Oral BID  . heparin  5,000 Units Subcutaneous Q8H  . hydrALAZINE  75 mg Oral Q8H  . insulin aspart  0-15 Units Subcutaneous TID WC  .  insulin aspart  4 Units Subcutaneous TID WC  . insulin detemir  14 Units Subcutaneous QHS  . isosorbide mononitrate  60 mg Oral Daily  . pantoprazole  40 mg Oral Daily  . sodium chloride  3 mL Intravenous Q12H    Infusions: . sodium chloride 5 mL/hr at 02/26/13 0408  . DOPamine 3 mcg/kg/min (02/25/13 1317)  . furosemide (LASIX) infusion 10 mg/hr (02/25/13 1210)    PRN Medications: sodium chloride, acetaminophen, hydrALAZINE, ondansetron (ZOFRAN) IV, promethazine, sodium chloride   Assessment:   1. Acute on chronic diastolic chf with anasarca 2. Hypertensive crisis 3. Pulm HTN:  4. OSA: Cont cpap.  5. DM 6. Acute/chronic renal failure:  (baseline unclear) 7. Acute respiratory failure 8. Protein calorie malnutrition (albumin 2.3) 9. Large pericardial effusion  Plan/Discussion:   Volume status remains elevated despite dopamine and lasix drip. Sluggish urine out put. Creatinine slightly improved.Will need to consider Nephrology consult.   Fat pad biopsy negative but inadequate sample.   CLEGG,AMY,NP-C 6:38 AM Advanced Heart Failure Team Pager [phone] (M-F; 7a - 4p)  Please contact [facility] for night-coverage after hours (4p -7a ) and weekends on [email or web address]  Patient seen with NP, agree with note.  1. Acute on chronic diastolic CHF: Patient is very volume overloaded.  She has had AKI and is on low dose dopamine.  Creatinine seems to have plateaued but she is diuresing poorly.  CVP still around 20.  There is a question of amyloidosis.  She has severe LVH with pericardial effusion.  Urine/serum immunofixation did not show monoclonal protein.  Abdominal fat pad biopsy was negative but was a poor sample.   Restrictive hemodynamics on RHC. Co-ox has been ok.  - Increase Lasix to 15 mg/hr today and add a dose of metolazone 2.5 mg x 1.  Will get pm BMET.  - Continue low dose dopamine gtt.  - Discussion with Dr. Donata Clay re: open biopsy (look for amyloidosis) and pericardial window later this week. 2. AKI on CKD: Creatinine high, seems plateaued.  Not getting effective diuresis and very volume overloaded.  If we cannot diurese with the above measures, will need to involve renal.  BMET this afternoon.  3. HTN: BP high on dopamine.  Would like to maintain low dose dopamine.  We will titrate up hydralazine and start on nicardipine gtt, titrate to SBP < 140.   Marca Ancona 02/26/2013 8:25 AM  Mrs Peil looks significantly worse this afternoon.  Minimal urine output, lethargic.  ? Uremia.  Will repeat BMET.  Talked to renal and CCM, plan to place HD catheter now.  Discussed with Dr. Donata Clay, will take for pericardial window today rather than later in week to try to improve output/renal function.  As she is very lethargic and will be going to the OR later today, will go ahead and intubate prior to line placement.   Marca Ancona 02/26/2013 1:56 PM

## 2013-02-26 NOTE — Anesthesia Postprocedure Evaluation (Signed)
  Anesthesia Post-op Note  Patient: Gail Chapman  Procedure(s) Performed: Procedure(s): SUBXYPHOID PERICARDIAL WINDOW (N/A)  Patient Location: SICU  Anesthesia Type:General  Level of Consciousness: sedated, unresponsive and Patient remains intubated per anesthesia plan  Airway and Oxygen Therapy: Patient remains intubated per anesthesia plan  Post-op Pain: none  Post-op Assessment: Post-op Vital signs reviewed, Patient's Cardiovascular Status Stable and Respiratory Function Stable  Post-op Vital Signs: stable  Complications: No apparent anesthesia complications

## 2013-02-26 NOTE — Progress Notes (Signed)
Dr Allena Katz rounding on pt and came out to get some help for Gail Chapman. Pt had large amount of emesis down her chest, but seemed lethargic and not really aware or concerned that she was soiled.  Multiple whole pills noted in emesis.  Pt had recently taken morning doses of medication.  Pt cleaned up and changed.    Eliane Decree, RN

## 2013-02-26 NOTE — Progress Notes (Signed)
Day of Surgery Procedure(s) (LRB): SUBXYPHOID PERICARDIAL WINDOW (N/A) Subjective: Stable, intubated after subxiphoid window Stable hemodynamics normal sinus rhythm minimal chest tube drainage   Objective: Vital signs in last 24 hours: Temp:  [93.7 F (34.3 C)-98.7 F (37.1 C)] 94.1 F (34.5 C) (05/21 1900) Pulse Rate:  [60-79] 60 (05/21 1900) Cardiac Rhythm:  [-] Normal sinus rhythm (05/21 1800) Resp:  [12-28] 21 (05/21 1900) BP: (132-197)/(46-87) 151/66 mmHg (05/21 1900) SpO2:  [89 %-100 %] 98 % (05/21 1900) Arterial Line BP: (185-226)/(60-78) 185/60 mmHg (05/21 1900) FiO2 (%):  [50 %-100 %] 50 % (05/21 1942) Weight:  [192 lb 10.9 oz (87.4 kg)] 192 lb 10.9 oz (87.4 kg) (05/21 0500)  Hemodynamic parameters for last 24 hours: CVP:  [16 mmHg-20 mmHg] 20 mmHg  Intake/Output from previous day: 05/20 0701 - 05/21 0700 In: 352.5 [P.O.:60; I.V.:292.5] Out: 995 [Urine:995] Intake/Output this shift:    Comfortable Lungs with scattered rhonchi No airleak  Lab Results:  Recent Labs  02/25/13 1200  02/26/13 1700 02/26/13 1750  WBC 7.5  --  6.9  --   HGB 8.7*  < > 8.5* 9.2*  HCT 26.9*  < > 26.7* 27.0*  PLT 228  --  241  --   < > = values in this interval not displayed. BMET:  Recent Labs  02/26/13 0415 02/26/13 1300 02/26/13 1612 02/26/13 1750  NA 134* 137 137 137  K 4.9 4.8 4.4 4.4  CL 98 100  --   --   CO2 26 25  --   --   GLUCOSE 140* 130*  --  115*  BUN 63* 64*  --   --   CREATININE 3.15* 3.28*  --   --   CALCIUM 8.8 8.9  --   --     PT/INR:  Recent Labs  02/24/13 0538  LABPROT 13.8  INR 1.07   ABG    Component Value Date/Time   PHART 7.475* 02/26/2013 1745   HCO3 28.2* 02/26/2013 1745   TCO2 29 02/26/2013 1745   ACIDBASEDEF 1.0 02/25/2013 0937   O2SAT 100.0 02/26/2013 1745   CBG (last 3)   Recent Labs  02/25/13 2250 02/26/13 0736 02/26/13 1238  GLUCAP 140* 124* 128*    Assessment/Plan: S/P Procedure(s) (LRB): SUBXYPHOID PERICARDIAL  WINDOW (N/A) Plan dialysis tomorrow Wean ventilator as tolerated   LOS: 7 days    VAN TRIGT III,PETER 02/26/2013

## 2013-02-26 NOTE — Progress Notes (Signed)
RT note- per order and ABG results, ventilator changes.

## 2013-02-27 ENCOUNTER — Encounter (HOSPITAL_COMMUNITY): Payer: Self-pay | Admitting: Cardiothoracic Surgery

## 2013-02-27 ENCOUNTER — Encounter (HOSPITAL_COMMUNITY): Payer: BC Managed Care – PPO

## 2013-02-27 ENCOUNTER — Inpatient Hospital Stay (HOSPITAL_COMMUNITY): Payer: BC Managed Care – PPO

## 2013-02-27 DIAGNOSIS — E119 Type 2 diabetes mellitus without complications: Secondary | ICD-10-CM

## 2013-02-27 LAB — GLUCOSE, CAPILLARY
Glucose-Capillary: 105 mg/dL — ABNORMAL HIGH (ref 70–99)
Glucose-Capillary: 108 mg/dL — ABNORMAL HIGH (ref 70–99)
Glucose-Capillary: 97 mg/dL (ref 70–99)

## 2013-02-27 LAB — BASIC METABOLIC PANEL
CO2: 26 mEq/L (ref 19–32)
Glucose, Bld: 118 mg/dL — ABNORMAL HIGH (ref 70–99)
Potassium: 4.8 mEq/L (ref 3.5–5.1)
Sodium: 137 mEq/L (ref 135–145)

## 2013-02-27 LAB — BLOOD GAS, ARTERIAL
Acid-Base Excess: 0.9 mmol/L (ref 0.0–2.0)
Bicarbonate: 25.3 mEq/L — ABNORMAL HIGH (ref 20.0–24.0)
FIO2: 0.4 %
TCO2: 26.6 mmol/L (ref 0–100)
pCO2 arterial: 42.6 mmHg (ref 35.0–45.0)
pH, Arterial: 7.391 (ref 7.350–7.450)
pO2, Arterial: 92.1 mmHg (ref 80.0–100.0)

## 2013-02-27 LAB — HEPATITIS B SURFACE ANTIBODY,QUALITATIVE: Hep B S Ab: REACTIVE — AB

## 2013-02-27 LAB — CBC
HCT: 27.8 % — ABNORMAL LOW (ref 36.0–46.0)
Hemoglobin: 8.9 g/dL — ABNORMAL LOW (ref 12.0–15.0)
RBC: 3.34 MIL/uL — ABNORMAL LOW (ref 3.87–5.11)

## 2013-02-27 LAB — HEPATITIS B SURFACE ANTIGEN: Hepatitis B Surface Ag: NEGATIVE

## 2013-02-27 LAB — KAPPA/LAMBDA LIGHT CHAINS: Kappa free light chain: 7.56 mg/dL — ABNORMAL HIGH (ref 0.33–1.94)

## 2013-02-27 LAB — PARATHYROID HORMONE, INTACT (NO CA): PTH: 98.5 pg/mL — ABNORMAL HIGH (ref 14.0–72.0)

## 2013-02-27 LAB — HEPATITIS B CORE ANTIBODY, TOTAL: Hep B Core Total Ab: NEGATIVE

## 2013-02-27 MED ORDER — AMLODIPINE BESYLATE 5 MG PO TABS
5.0000 mg | ORAL_TABLET | Freq: Every day | ORAL | Status: DC
  Administered 2013-02-28: 5 mg via ORAL
  Filled 2013-02-27 (×2): qty 1

## 2013-02-27 MED ORDER — SODIUM CHLORIDE 0.9 % IV SOLN
1020.0000 mg | Freq: Once | INTRAVENOUS | Status: AC
  Administered 2013-02-27: 1020 mg via INTRAVENOUS
  Filled 2013-02-27: qty 34

## 2013-02-27 MED ORDER — AMLODIPINE 1 MG/ML ORAL SUSPENSION: 5.0000 mg | Freq: Every day | ORAL | Status: DC

## 2013-02-27 MED ORDER — SODIUM CHLORIDE 0.9 % IV SOLN: 100.0000 mL | INTRAVENOUS | Status: DC | PRN

## 2013-02-27 MED ORDER — INSULIN ASPART 100 UNIT/ML ~~LOC~~ SOLN
2.0000 [IU] | SUBCUTANEOUS | Status: DC
  Administered 2013-02-28: 2 [IU] via SUBCUTANEOUS
  Administered 2013-02-28: 12:00:00 via SUBCUTANEOUS
  Administered 2013-03-01: 2 [IU] via SUBCUTANEOUS
  Administered 2013-03-01: 4 [IU] via SUBCUTANEOUS
  Administered 2013-03-02: 2 [IU] via SUBCUTANEOUS
  Administered 2013-03-02 – 2013-03-03 (×2): 4 [IU] via SUBCUTANEOUS
  Administered 2013-03-04: 2 [IU] via SUBCUTANEOUS
  Administered 2013-03-04: 4 [IU] via SUBCUTANEOUS
  Administered 2013-03-04: 2 [IU] via SUBCUTANEOUS

## 2013-02-27 MED ORDER — CHLORHEXIDINE GLUCONATE 0.12 % MT SOLN
15.0000 mL | Freq: Two times a day (BID) | OROMUCOSAL | Status: DC
  Administered 2013-02-27 – 2013-02-28 (×3): 15 mL via OROMUCOSAL
  Filled 2013-02-27 (×3): qty 15

## 2013-02-27 MED ORDER — ALTEPLASE 2 MG IJ SOLR
2.0000 mg | Freq: Once | INTRAMUSCULAR | Status: AC | PRN
  Filled 2013-02-27: qty 2

## 2013-02-27 MED ORDER — PANTOPRAZOLE SODIUM 40 MG PO PACK
40.0000 mg | PACK | Freq: Every day | ORAL | Status: DC
  Administered 2013-02-27 – 2013-03-02 (×3): 40 mg
  Filled 2013-02-27 (×6): qty 20

## 2013-02-27 MED ORDER — CHLORHEXIDINE GLUCONATE 0.12 % MT SOLN
OROMUCOSAL | Status: AC
  Administered 2013-02-27: 15 mL
  Filled 2013-02-27: qty 15

## 2013-02-27 NOTE — Progress Notes (Signed)
INITIAL NUTRITION ASSESSMENT  DOCUMENTATION CODES Per approved criteria  -Obesity Unspecified   INTERVENTION:  If EN started, recommend Osmolite 1.5 formula at goal rate of 15 ml/hr with Prostat liquid protein 30 ml 5 times daily to provide 1040 (63% of estimated kcal needs), 98 gm protein (100% of estimated protein needs), 274 ml of free water  Recommend liquid MVI daily via tube RD to follow for nutrition care plan  NUTRITION DIAGNOSIS: Inadequate oral intake related to inability to eat as evidenced by NPO status  Goal: Initiate EN support within next 24-48 hours if prolonged intubation expected  Monitor:  EN initiation, respiratory status, weight, labs, I/O's  Reason for Assessment: Malnutrition Screening Tool Report, VDRF  57 y.o. female  Admitting Dx: Acute on chronic diastolic CHF (congestive heart failure), NYHA class 4  ASSESSMENT: Patient presented to ED with dyspnea and HTN; s/p cardiac cath 5/20 ---> suggestive of restrictive physiology, no evidenced of constriction despite large pericardial effusion.  S/p procedures 5/22: SUBXYPHOID PERICARDIAL WINDOW MYOCARDIAL BIOPSY PLACEMENT OF LEFT IJ DIALYSIS CATHETER  Patient is currently intubated on ventilator support ---> OGT in place MV: 8.1 Temp: 37.2  Height: Ht Readings from Last 1 Encounters:  02/19/13 5' 1.5" (1.562 m)    Weight: Wt Readings from Last 1 Encounters:  02/27/13 201 lb 15.1 oz (91.6 kg)    Ideal Body Weight: 105 lb  % Ideal Body Weight: 191%  Wt Readings from Last 10 Encounters:  02/27/13 201 lb 15.1 oz (91.6 kg)  02/27/13 201 lb 15.1 oz (91.6 kg)  02/27/13 201 lb 15.1 oz (91.6 kg)    Usual Body Weight: unable to obtain  % Usual Body Weight: ---  BMI:  Body mass index is 37.54 kg/(m^2).  Estimated Nutritional Needs: Kcal: 1650-1750 Protein: 95-105 gm Fluid: per MD  Skin: chest & abdominal surgical incisions   Diet Order: NPO  EDUCATION NEEDS: -No education needs  identified at this time   Intake/Output Summary (Last 24 hours) at 02/27/13 1331 Last data filed at 02/27/13 1300  Gross per 24 hour  Intake 1643.33 ml  Output   1765 ml  Net -121.67 ml    Labs:   Recent Labs Lab 02/26/13 0415 02/26/13 1300 02/26/13 1612 02/26/13 1750 02/27/13 0348  NA 134* 137 137 137 137  K 4.9 4.8 4.4 4.4 4.8  CL 98 100  --   --  101  CO2 26 25  --   --  26  BUN 63* 64*  --   --  67*  CREATININE 3.15* 3.28*  --   --  3.49*  CALCIUM 8.8 8.9  --   --  8.5  GLUCOSE 140* 130*  --  115* 118*    CBG (last 3)   Recent Labs  02/26/13 1238 02/27/13 0922 02/27/13 1146  GLUCAP 128* 105* 97    Scheduled Meds: . acetaminophen  1,000 mg Oral Q6H   Or  . acetaminophen (TYLENOL) oral liquid 160 mg/5 mL  975 mg Per Tube Q6H  . amLODipine  5 mg Oral Daily  . aspirin EC  325 mg Oral Daily   Or  . aspirin  324 mg Per Tube Daily  . bisacodyl  10 mg Oral Daily   Or  . bisacodyl  10 mg Rectal Daily  . cefUROXime (ZINACEF)  IV  1.5 g Intravenous Q12H  . docusate sodium  200 mg Oral Daily  . ferumoxytol  1,020 mg Intravenous Once  . insulin aspart  2-6 Units  Subcutaneous Q4H  . metoprolol tartrate  12.5 mg Oral BID   Or  . metoprolol tartrate  12.5 mg Per Tube BID  . pantoprazole sodium  40 mg Per Tube Daily  . sodium chloride  3 mL Intravenous Q12H    Continuous Infusions: . sodium chloride 20 mL/hr (02/26/13 1814)  . sodium chloride    . sodium chloride    . dexmedetomidine 0.5 mcg/kg/hr (02/27/13 1300)  . lactated ringers    . niCARDipine Stopped (02/27/13 1100)    Past Medical History  Diagnosis Date  . Diabetes mellitus without complication   . Chronic diastolic CHF (congestive heart failure)     a. 01/2007 MV: No isch/infarct, nl EF;  b. 2012 Cath: "normal" per patient.  Performed in [place], [place] by Dr. Graciela Husbands.  . Pulmonary hypertension     a. on home O2 @ 3lpm 24hrs/day  . HTN (hypertension)     a. Dx in 1993.  Marland Kitchen LVH (left ventricular  hypertrophy)   . OSA (obstructive sleep apnea)     a. uses CPAP  . Physical deconditioning     Past Surgical History  Procedure Laterality Date  . Knee surgery    . Carpal tunnel release    . Tubal ligation    . Cholecystectomy    . Mass excision      a. under axilla.    Maureen Chatters, RD, LDN Pager #: [phone] After-Hours Pager #: [phone]

## 2013-02-27 NOTE — Progress Notes (Signed)
[facility] DAILY PROGRESS NOTE                   [address]            [place] [id number]          [phone]      1 Day Post-Op Procedure(s) (LRB): SUBXYPHOID PERICARDIAL WINDOW (N/A)  Total Length of Stay:  LOS: 8 days   Subjective: Sedated on vent but follows commands  Objective: Vital signs in last 24 hours: Temp:  [93.7 F (34.3 C)-99.5 F (37.5 C)] 97.3 F (36.3 C) (05/22 0731) Pulse Rate:  [58-118] 59 (05/22 0731) Cardiac Rhythm:  [-] Sinus bradycardia (05/22 0400) Resp:  [0-26] 22 (05/22 0731) BP: (127-192)/(47-80) 132/65 mmHg (05/22 0630) SpO2:  [98 %-100 %] 100 % (05/22 0731) Arterial Line BP: (130-226)/(51-78) 155/57 mmHg (05/22 0645) FiO2 (%):  [40 %-100 %] 40 % (05/22 0731) Weight:  [195 lb 12.3 oz (88.8 kg)] 195 lb 12.3 oz (88.8 kg) (05/22 0500)  Filed Weights   02/25/13 0500 02/26/13 0500 02/27/13 0500  Weight: 192 lb 10.9 oz (87.4 kg) 192 lb 10.9 oz (87.4 kg) 195 lb 12.3 oz (88.8 kg)    Weight change: 3 lb 1.4 oz (1.4 kg)   Hemodynamic parameters for last 24 hours: CVP:  [20 mmHg] 20 mmHg  Intake/Output from previous day: 05/21 0701 - 05/22 0700 In: 1705 [P.O.:120; I.V.:1325; NG/GT:60; IV Piggyback:200] Out: 1615 [Urine:865; Emesis/NG output:400; Chest Tube:350]  Intake/Output this shift:    Current Meds: Scheduled Meds: . acetaminophen  1,000 mg Oral Q6H   Or  . acetaminophen (TYLENOL) oral liquid 160 mg/5 mL  975 mg Per Tube Q6H  . aspirin EC  325 mg Oral Daily   Or  . aspirin  324 mg Per Tube Daily  . bisacodyl  10 mg Oral Daily   Or  . bisacodyl  10 mg Rectal Daily  . cefUROXime (ZINACEF)  IV  1.5 g Intravenous Q12H  . docusate sodium  200 mg Oral Daily  . famotidine (PEPCID) IV  20 mg Intravenous Q12H  . ferumoxytol  1,020 mg Intravenous Once  . metoprolol tartrate  12.5 mg Oral BID   Or  . metoprolol tartrate  12.5 mg Per Tube BID  . pantoprazole sodium  40 mg Per Tube Daily  . sodium chloride  3 mL Intravenous  Q12H   Continuous Infusions: . sodium chloride 20 mL/hr (02/26/13 1814)  . sodium chloride    . sodium chloride    . dexmedetomidine 0.7 mcg/kg/hr (02/27/13 0532)  . lactated ringers    . niCARDipine 2.5 mg/hr (02/27/13 0300)   PRN Meds:.albumin human, metoprolol, midazolam, morphine injection, ondansetron (ZOFRAN) IV, oxyCODONE, sodium chloride  General appearance: alert and cooperative Neurologic: intact Heart: regular rate and rhythm, S1, S2 normal, no murmur, click, rub or gallop and normal apical impulse Lungs: diminished breath sounds bilaterally Abdomen: soft, non-tender; bowel sounds normal; no masses,  no organomegaly Extremities: extremities normal, atraumatic, no cyanosis or edema and Homans sign is negative, no sign of DVT Wound: minimal drainage from pericardial tube  Lab Results: CBC: Recent Labs  02/26/13 1700 02/26/13 1750 02/27/13 0348  WBC 6.9  --  12.2*  HGB 8.5* 9.2* 8.9*  HCT 26.7* 27.0* 27.8*  PLT 241  --  216   BMET:  Recent Labs  02/26/13 1300  02/26/13 1750 02/27/13 0348  NA 137  < > 137 137  K 4.8  < > 4.4  4.8  CL 100  --   --  101  CO2 25  --   --  26  GLUCOSE 130*  --  115* 118*  BUN 64*  --   --  67*  CREATININE 3.28*  --   --  3.49*  CALCIUM 8.9  --   --  8.5  < > = values in this interval not displayed.  PT/INR: No results found for this basename: LABPROT, INR,  in the last 72 hours Radiology: Dg Chest Portable 1 View  02/26/2013   *RADIOLOGY REPORT*  Clinical Data: Postop pericardial window.  PORTABLE CHEST - 1 VIEW  Comparison: 02/20/2013  Findings: Endotracheal tube in good position.  Left jugular catheter tips in the right atrium.  NG tube enters the stomach with the tip not visualized.  Pericardial drain in good position.  Congestive heart failure with edema shows mild interval improvement.  No pneumothorax.  IMPRESSION: Support lines in good position.  Congestive heart failure with edema, showing interval improvement.   Original  Report Authenticated By: G[place], M.D.   Dg Fluoro Guide Cv Line-no Report  02/26/2013   CLINICAL DATA: HD Cath   FLOURO GUIDE CV LINE  Fluoroscopy was utilized by the requesting physician.  No radiographic  interpretation.      Assessment/Plan: S/P Procedure(s) (LRB): SUBXYPHOID PERICARDIAL WINDOW (N/A) Dialysis today Vent and critical care per CCM and cardiology Likely d/c pericardial tube tomorrow Path pending on Myocardial bx     Gail Chapman B 02/27/2013 7:52 AM

## 2013-02-27 NOTE — Op Note (Signed)
[name], [name]               ACCOUNT NO.:  [id number]  MEDICAL RECORD NO.:  [id number]  LOCATION:  2302                         FACILITY:  [facility]  PHYSICIAN:  Burna Forts, M.D.DATE OF BIRTH:  [birth year]  DATE OF PROCEDURE:  02/26/2013 DATE OF DISCHARGE:                              OPERATIVE REPORT   INDICATIONS FOR PROCEDURE:  [name] is a 57 year old female, patient of Dr. Arvilla Meres.  She presents today emergently from the ICU after having been intubated for respiratory difficulties and failures. She has a history of fluid retention and anasarca complicated by left and right heart failures.  She is also discovered to have a pericardial effusion.  She is being brought to the OR by Dr. Ofilia Neas for drainage of the pericardial effusion on an urgent basis.  We have been asked to place the TEE probe for evaluation of the pre and post drainage images.  The patient is brought to the OR directly from the ICU.  General anesthesia is induced.  The TEE probe was then prepared and passed oropharyngeally into the stomach, then slightly withdrawn for imaging of the cardiac structures.  Left ventricle:  The left ventricular chamber is seen initially in a short axis view.  Immediate noticed interest is a moderate-to-large pericardial effusion that surrounds the heart at the level of the short axis view of the left ventricular chamber.  It is considered to be a moderate-to-large pericardial effusion.  This well outlined both laterally and posteriorly with the largest diameter of about 3.2 to 3.3 cm in diameter.  Also, on view of the short axis of the left ventricular chamber is noted that there is impressive thickness of the left ventricular chamber itself.  This is considered to be concentrically hypertrophied.  There is good overall contractile pattern at this level. Both short and long axis views are obtained.  The right ventricle is also seen and is moderately  dilated, but it is contractile.  On pullback, to view the 4-chamber view, we could see both tricuspid and mitral leaflets, both are somewhat thickened, the mitral more so than the other.  There is also calcium noted in the area of the mitral valve.  Color Doppler over both shows only trivial regurgitant flow through across either tricuspid and mitral valve.  Again a pullback further to view the right and left atrium, we were able to see a larger area of the pericardial effusion adjacent to the right atrial chamber. However, it does not cause any significant compression of the right atrial chamber.  There does not appear to be any significant tamponade.  The procedure carried out as a subxiphoid incision followed by complete drainage of the pericardial effusion.  The images were again obtained in both long and short axis views at the left ventricular level and at the four-chamber level, which indicate complete drainage of the pericardial effusion at this level.  There were no further complications.         ______________________________ Burna Forts, M.D.    JTM/MEDQ  D:  02/26/2013  T:  02/27/2013  Job:  [id number]

## 2013-02-27 NOTE — Progress Notes (Signed)
Utilization Review Completed. 02/27/2013  

## 2013-02-27 NOTE — Progress Notes (Signed)
Patient ID: Gail Chapman, female   DOB: 05-08-1956, 57 y.o.   MRN: [id number]   [facility] Progress Note   Subjective:   Intubated yesterday for airway protection/declining mental status prior to subxiphoid pericardial window and tunnel dialysis catheter placement in the OR yesterday. Did well postoperatively overnight.    Objective:   BP 132/65  Pulse 58  Temp(Src) 97.5 F (36.4 C) (Core (Comment))  Resp 21  Ht 5' 1.5" (1.562 m)  Wt 88.8 kg (195 lb 12.3 oz)  BMI 36.4 kg/m2  SpO2 100%  Intake/Output Summary (Last 24 hours) at 02/27/13 4098 Last data filed at 02/27/13 0700  Gross per 24 hour  Intake 1705.04 ml  Output   1615 ml  Net  90.04 ml   Weight change: 1.4 kg (3 lb 1.4 oz)  Physical Exam: Gen: Intubated, sedated CVS: Pulse regular bradycardia, heart sounds S1 and S2 distant Resp: Clear to auscultation bilaterally Abd: Soft, obese, nontender and bowel sounds are normal Ext: 2+ upper extremity edema, 2+ lower extremity edema  Imaging: Dg Chest Portable 1 View  02/26/2013   *RADIOLOGY REPORT*  Clinical Data: Postop pericardial window.  PORTABLE CHEST - 1 VIEW  Comparison: 02/20/2013  Findings: Endotracheal tube in good position.  Left jugular catheter tips in the right atrium.  NG tube enters the stomach with the tip not visualized.  Pericardial drain in good position.  Congestive heart failure with edema shows mild interval improvement.  No pneumothorax.  IMPRESSION: Support lines in good position.  Congestive heart failure with edema, showing interval improvement.   Original Report Authenticated By: Janeece Riggers, M.D.   Dg Fluoro Guide Cv Line-no Report  02/26/2013   CLINICAL DATA: HD Cath   FLOURO GUIDE CV LINE  Fluoroscopy was utilized by the requesting physician.  No radiographic  interpretation.     Labs: BMET  Recent Labs Lab 02/23/13 0510 02/23/13 1935 02/24/13 0538 02/25/13 0453 02/25/13 1200 02/26/13 0415 02/26/13 1300 02/26/13 1612  02/26/13 1750 02/27/13 0348  NA 138 135 138 138  --  134* 137 137 137 137  K 4.6 4.7 4.8 4.8  --  4.9 4.8 4.4 4.4 4.8  CL 102 100 103 103  --  98 100  --   --  101  CO2 28 27 26 25   --  26 25  --   --  26  GLUCOSE 99 168* 180* 115*  --  140* 130*  --  115* 118*  BUN 43* 47* 51* 55*  --  63* 64*  --   --  67*  CREATININE 3.02* 3.26* 3.38* 3.31* 3.32* 3.15* 3.28*  --   --  3.49*  CALCIUM 8.7 8.8 8.7 8.6  --  8.8 8.9  --   --  8.5   CBC  Recent Labs Lab 02/24/13 0538 02/25/13 1200 02/26/13 1612 02/26/13 1700 02/26/13 1750 02/27/13 0348  WBC 7.8 7.5  --  6.9  --  12.2*  HGB 8.6* 8.7* 8.2* 8.5* 9.2* 8.9*  HCT 26.9* 26.9* 24.0* 26.7* 27.0* 27.8*  MCV 85.7 84.9  --  82.9  --  83.2  PLT 223 228  --  241  --  216    Medications:    . acetaminophen  1,000 mg Oral Q6H   Or  . acetaminophen (TYLENOL) oral liquid 160 mg/5 mL  975 mg Per Tube Q6H  . aspirin EC  325 mg Oral Daily   Or  . aspirin  324 mg Per Tube  Daily  . bisacodyl  10 mg Oral Daily   Or  . bisacodyl  10 mg Rectal Daily  . cefUROXime (ZINACEF)  IV  1.5 g Intravenous Q12H  . chlorhexidine      . docusate sodium  200 mg Oral Daily  . famotidine (PEPCID) IV  20 mg Intravenous Q12H  . metoprolol tartrate  12.5 mg Oral BID   Or  . metoprolol tartrate  12.5 mg Per Tube BID  . pantoprazole sodium  40 mg Per Tube Daily  . sodium chloride  3 mL Intravenous Q12H     Assessment/ Plan:   1. Acute renal failure chronic kidney disease: Suspect prominent cardiorenal component to this-unfortunately has failed isolated ultrafiltration and trials of diuretics. Given her volume overloaded status and decline in renal function and the urgency to get off volume-plan to start dialysis today via left IJ tunneled dialysis catheter. We'll reassess daily for dialysis needs and try and be aggressive with volume removal as blood pressure allows.  2. Diastolic dysfunction/congestive heart failure: Volume removal with dialysis given  limitations of diuretics/ultrafiltration so far. 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 3. Anemia: Possibly anemia of chronic disease including CK D., low iron stores noted-will replete and consider ESA therapy.  4. Ventilator dependent respiratory failure: Ultrafiltration today with hopes of extubating soon.   Zetta Bills, MD 02/27/2013, 7:23 AM

## 2013-02-27 NOTE — Progress Notes (Signed)
Advanced Heart Failure Rounding Note   Subjective:    57 y/o female with a h/o HTN, DM, diastolic HF and PAH. Admitted 5/14 with HTN crisis (BP 260/120) and massive volume overload.   Urine IFE with light chains no Mspike reported 02/24/13 Fat Pad biopsy negative but limited   5/19/4 Renal US no hydronephrosis  RHC 02/25/13 RA = 20  RV = 94/6/23  PA = 96/41 (59)  PCW = 24  Ao = 167/70 (106)  LV = 164/17/28  Fick cardiac output/index = 7.8/4.2  PVR = 4.4 [place]  SVR = 880  FA sat = 98%  PA sat = 68%, 71%  There was no signficant gradient across the aortic valve on pullback.  1. Markedly elevated RV and LV pressures without interaction suggestive of restrictive physiology  2. Pulmonary venous HTN.  3. Normal cardiac output  Yesterday underwent pericaridal window for large effusion and myocardial biopsy. Also intubated fo respiratory failure and perm cath placed for initiation of HD.  Remains intubated. On HD with 4L off. SBp back up to 180s Cardene drip restarted. Sedated but arousable.    Objective:    Vital Signs:   Temp:  [93.7 F (34.3 C)-99.5 F (37.5 C)] 99 F (37.2 C) (05/22 1430) Pulse Rate:  [58-118] 71 (05/22 1430) Resp:  [0-26] 15 (05/22 1430) BP: (124-182)/(55-78) 153/67 mmHg (05/22 1430) SpO2:  [98 %-100 %] 99 % (05/22 1430) Arterial Line BP: (130-226)/(51-78) 174/71 mmHg (05/22 1300) FiO2 (%):  [40 %-100 %] 40 % (05/22 1300) Weight:  [88.8 kg (195 lb 12.3 oz)-91.6 kg (201 lb 15.1 oz)] 91.6 kg (201 lb 15.1 oz) (05/22 1045)    Weight change: Filed Weights   02/26/13 0500 02/27/13 0500 02/27/13 1045  Weight: 87.4 kg (192 lb 10.9 oz) 88.8 kg (195 lb 12.3 oz) 91.6 kg (201 lb 15.1 oz)    Intake/Output:   Intake/Output Summary (Last 24 hours) at 02/27/13 1434 Last data filed at 02/27/13 1300  Gross per 24 hour  Intake 1593.43 ml  Output   1765 ml  Net -171.57 ml     Physical Exam:  General: Sedated but arousable. Intubated Neuro:Moves all  extremities spontaneously.  HEENT: ET tube. L chest perm cath L subclavian CVL Neck: Supple without bruits. JVP jaw. Carotids 2+ bilaterally without bruits  Lungs: Resp regular and unlabored, clear anteriorly Heart: RRR no s3, + s4, 2/6 TR  Abdomen: mildly distended. nontender, BS + x 4.  Extremities: No clubbing, cyanosis. Tr-1+ bilat LEE   Telemetry: SR 60-70s  Labs: Basic Metabolic Panel:  Recent Labs Lab 02/24/13 0538 02/25/13 0453 02/25/13 1200 02/26/13 0415 02/26/13 1300 02/26/13 1612 02/26/13 1750 02/27/13 0348  NA 138 138  --  134* 137 137 137 137  K 4.8 4.8  --  4.9 4.8 4.4 4.4 4.8  CL 103 103  --  98 100  --   --  101  CO2 26 25  --  26 25  --   --  26  GLUCOSE 180* 115*  --  140* 130*  --  115* 118*  BUN 51* 55*  --  63* 64*  --   --  67*  CREATININE 3.38* 3.31* 3.32* 3.15* 3.28*  --   --  3.49*  CALCIUM 8.7 8.6  --  8.8 8.9  --   --  8.5    Liver Function Tests:  Recent Labs Lab 02/26/13 1300  AST 19  ALT 19  ALKPHOS 115  BILITOT 0.3  PROT 6.4  ALBUMIN 2.3*   No results found for this basename: LIPASE, AMYLASE,  in the last 168 hours  Recent Labs Lab 02/26/13 0415  AMMONIA 23    CBC:  Recent Labs Lab 02/23/13 1935 02/24/13 0538 02/25/13 1200 02/26/13 1612 02/26/13 1700 02/26/13 1750 02/27/13 0348  WBC 9.1 7.8 7.5  --  6.9  --  12.2*  HGB 9.1* 8.6* 8.7* 8.2* 8.5* 9.2* 8.9*  HCT 28.6* 26.9* 26.9* 24.0* 26.7* 27.0* 27.8*  MCV 85.1 85.7 84.9  --  82.9  --  83.2  PLT 249 223 228  --  241  --  216    Cardiac Enzymes: No results found for this basename: CKTOTAL, CKMB, CKMBINDEX, TROPONINI,  in the last 168 hours  BNP: BNP (last 3 results)  Recent Labs  02/19/13 1429 02/20/13 0500  PROBNP 45515.0* 57495.0*     Other results:   Imaging: Dg Chest Portable 1 View In Am  02/27/2013   *RADIOLOGY REPORT*  Clinical Data: Postoperative chest x-ray  PORTABLE CHEST - 1 VIEW  Comparison: Prior chest x-ray 02/26/2013  Findings: The  patient is intubated.  The tip of the endotracheal tube is 2.7 cm above the carina.  Incompletely imaged nasogastric tube.  The tip lies below the diaphragm, likely within the stomach. Subxiphoid approach left thoracostomy tube is in stable position. Left IJ tunneled hemodialysis catheter is also unchanged with the tip in the upper right atrium. Left subclavian approach central venous catheter also unchanged with the tip at the superior cavoatrial junction.  Stable enlargement of the cardiopericardial silhouette.  Small bilateral layering pleural effusions, background pulmonary edema and bibasilar opacities are not significantly changed.  No pneumothorax.  IMPRESSION:  1.  Overall, no significant interval change in the appearance of the chest with persistent enlargement the cardiopericardial silhouette, pulmonary edema, small bilateral layering effusions and associated bibasilar atelectasis versus infiltrate.  2.  Stable and satisfactory support apparatus   Original Report Authenticated By: Malachy Moan, M.D.   Dg Chest Portable 1 View  02/26/2013   *RADIOLOGY REPORT*  Clinical Data: Postop pericardial window.  PORTABLE CHEST - 1 VIEW  Comparison: 02/20/2013  Findings: Endotracheal tube in good position.  Left jugular catheter tips in the right atrium.  NG tube enters the stomach with the tip not visualized.  Pericardial drain in good position.  Congestive heart failure with edema shows mild interval improvement.  No pneumothorax.  IMPRESSION: Support lines in good position.  Congestive heart failure with edema, showing interval improvement.   Original Report Authenticated By: Janeece Riggers, M.D.   Dg Fluoro Guide Cv Line-no Report  02/26/2013   CLINICAL DATA: HD Cath   FLOURO GUIDE CV LINE  Fluoroscopy was utilized by the requesting physician.  No radiographic  interpretation.      Medications:     Scheduled Medications: . acetaminophen  1,000 mg Oral Q6H   Or  . acetaminophen (TYLENOL) oral  liquid 160 mg/5 mL  975 mg Per Tube Q6H  . amLODipine  5 mg Oral Daily  . aspirin EC  325 mg Oral Daily   Or  . aspirin  324 mg Per Tube Daily  . bisacodyl  10 mg Oral Daily   Or  . bisacodyl  10 mg Rectal Daily  . cefUROXime (ZINACEF)  IV  1.5 g Intravenous Q12H  . docusate sodium  200 mg Oral Daily  . insulin aspart  2-6 Units Subcutaneous Q4H  . metoprolol tartrate  12.5 mg Oral BID  Or  . metoprolol tartrate  12.5 mg Per Tube BID  . pantoprazole sodium  40 mg Per Tube Daily  . sodium chloride  3 mL Intravenous Q12H    Infusions: . sodium chloride 20 mL/hr (02/26/13 1814)  . sodium chloride    . sodium chloride    . dexmedetomidine 0.5 mcg/kg/hr (02/27/13 1300)  . lactated ringers    . niCARDipine 2.5 mg/hr (02/27/13 1419)    PRN Medications: metoprolol, midazolam, morphine injection, ondansetron (ZOFRAN) IV, oxyCODONE, sodium chloride   Assessment:   1. Acute on chronic diastolic chf with anasarca 2. Hypertensive crisis 3. Acute respiratory failure 4. Acute/chronic renal failure:  (baseline 2.5) Now on HD 5. DM 6. Large pericardial effusion - s/p window 5/21 7. Protein calorie malnutrition (albumin 2.3) 8. Urinary light chains      -Fat pad biopsy negative but inadequate sample.   Plan/Discussion:    Volume status much improved with HD. Stable on vent. Given her course over past few months, I think she will need long-term HD to control her volume status. Vent wean as she improves per CCM. Continue cardene for HTN.  Await results of myocardial biopsy.  Arvilla Meres 02/27/2013 2:34 PM

## 2013-02-27 NOTE — Consult Note (Signed)
PULMONARY  / CRITICAL CARE MEDICINE  Name: Gail Chapman MRN: [id number] DOB: 1955-11-06    ADMISSION DATE:  02/19/2013 CONSULTATION DATE:  02/26/13  REFERRING MD :  Dr. Shirlee Latch PRIMARY SERVICE: Cardiology  CHIEF COMPLAINT:  Altered Mental Status  BRIEF PATIENT DESCRIPTION:  57 y/o F with PMH of poorly controlled HTN, DM,  Diastolic CHF, initially admitted to [facility] with a 2-3 month hx of progressive weight gain & dyspnea, diuresed and discharged to Rehab.  Worsened at Rehab and was transferred to [facility] 5/14 for further care.  5/21 had progressive lethargy in the setting of worsened renal failure / antianxiety meds.  Questionable dx of amyloidosis.  PCCM consulted for AMS / Acute Respiratory Failure.    SIGNIFICANT EVENTS / STUDIES:  5/14 - Admit to [facility] with progressive renal fx, worsening swelling / wt gain, dyspnea 5/15 - Ultrafiltration initiated, continued until 5/19 5/20 - RHC >>Marked elevation of RV/LV pressures, Pulmonary HTN, Nml Cardiac output    LINES / TUBES: OETT 5/21>>> L [place] TLC>>>  CULTURES: 5/14 MRSA PCR>>>neg  ANTIBIOTICS:   HISTORY OF PRESENT ILLNESS:  57 y/o F with PMH of poorly controlled HTN, DM,  Diastolic CHF, initially admitted to [facility] with a 2-3 month hx of progressive weight gain & dyspnea.  Stayed in hospital approximately one week for attempted diuresis and discharged to Rehab with a discharge wt of 208 lbs. She continued to worsen at Rehab and was transferred to [facility] 5/14 for further care.  She underwent ultrafiltration from 5/15-5/19 with limited success and continued to have progressively worsening renal failure.  5/21 was noted to have increased lethargy in the setting of worsened renal failure / antianxiety meds.  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  A fat pad biopsy was inconclusive.  Currently, she is pending myocardial biopsy. She has had elevated Kappa and lambda light chains. PCCM consulted for  AMS / Acute Respiratory Failure.    PAST MEDICAL HISTORY :  Past Medical History  Diagnosis Date  . Diabetes mellitus without complication   . Chronic diastolic CHF (congestive heart failure)     a. 01/2007 MV: No isch/infarct, nl EF;  b. 2012 Cath: "normal" per patient.  Performed in [place], [place] by Dr. Graciela Husbands.  . Pulmonary hypertension     a. on home O2 @ 3lpm 24hrs/day  . HTN (hypertension)     a. Dx in 1993.  Marland Kitchen LVH (left ventricular hypertrophy)   . OSA (obstructive sleep apnea)     a. uses CPAP  . Physical deconditioning    Past Surgical History  Procedure Laterality Date  . Knee surgery    . Carpal tunnel release    . Tubal ligation    . Cholecystectomy    . Mass excision      a. under axilla.   Prior to Admission medications   Medication Sig Start Date End Date Taking? Authorizing Provider  atorvastatin (LIPITOR) 40 MG tablet Take 40 mg by mouth daily.   Yes Historical Provider, MD  docusate sodium (COLACE) 100 MG capsule Take 100 mg by mouth 2 (two) times daily.   Yes Historical Provider, MD  furosemide (LASIX) 40 MG tablet Take 40 mg by mouth daily.   Yes Historical Provider, MD  insulin aspart (NOVOLOG) 100 UNIT/ML injection Inject 4 Units into the skin 3 (three) times daily with meals. Per sliding scale   Yes Historical Provider, MD  insulin detemir (LEVEMIR) 100 UNIT/ML injection Inject 14  Units into the skin at bedtime. Hold if CBG<150   Yes Historical Provider, MD  labetalol (NORMODYNE) 200 MG tablet Take 200 mg by mouth 3 (three) times daily.   Yes Historical Provider, MD  NIFEdipine (PROCARDIA-XL/ADALAT-CC/NIFEDICAL-XL) 30 MG 24 hr tablet Take 60 mg by mouth daily.   Yes Historical Provider, MD  pantoprazole (PROTONIX) 40 MG tablet Take 40 mg by mouth daily.   Yes Historical Provider, MD  promethazine (PHENERGAN) 25 MG suppository Place 25 mg rectally at bedtime.   Yes Historical Provider, MD   Allergies  Allergen Reactions  . Clonidine Derivatives     Hand  itching  . Labetalol     Fatigue   . Vancomycin Rash    FAMILY HISTORY:  Family History  Problem Relation Age of Onset  . Diabetes Mother   . CAD Mother    SOCIAL HISTORY:  reports that she has quit smoking. She does not have any smokeless tobacco history on file. She reports that she does not drink alcohol or use illicit drugs.  REVIEW OF SYSTEMS:  Unable to assess as pt is altered.   SUBJECTIVE:   VITAL SIGNS: Temp:  [93.7 F (34.3 C)-99.5 F (37.5 C)] 97.5 F (36.4 C) (05/22 0900) Pulse Rate:  [58-118] 96 (05/22 0900) Resp:  [0-26] 14 (05/22 0900) BP: (124-182)/(47-72) 126/57 mmHg (05/22 0900) SpO2:  [98 %-100 %] 100 % (05/22 0900) Arterial Line BP: (130-226)/(51-78) 158/62 mmHg (05/22 0900) FiO2 (%):  [40 %-100 %] 40 % (05/22 0900) Weight:  [88.8 kg (195 lb 12.3 oz)] 88.8 kg (195 lb 12.3 oz) (05/22 0500)  HEMODYNAMICS: CVP:  [20 mmHg] 20 mmHg  VENTILATOR SETTINGS: Vent Mode:  [-] PRVC FiO2 (%):  [40 %-100 %] 40 % Set Rate:  [14 bmp] 14 bmp Vt Set:  [400 mL-500 mL] 400 mL PEEP:  [5 cmH20] 5 cmH20 Plateau Pressure:  [10 cmH20-22 cmH20] 21 cmH20  INTAKE / OUTPUT: Intake/Output     05/21 0701 - 05/22 0700 05/22 0701 - 05/23 0700   P.O. 120 240   I.V. (mL/kg) 1325 (14.9) 105.3 (1.2)   NG/GT 60    IV Piggyback 200    Total Intake(mL/kg) 1705 (19.2) 345.3 (3.9)   Urine (mL/kg/hr) 865 (0.4) 110 (0.5)   Emesis/NG output 400 (0.2)    Chest Tube 350 (0.2)    Total Output 1615 110   Net +90 +235.3          PHYSICAL EXAMINATION: General: chronically ill adult female Neuro:  Lethargic, arouses to name and falls back to sleep, moves all ext spontaneously HEENT:  Mm pink/moist Cardiovascular:  s1s2 rrr, no m/r/g Lungs:  resp's shallow, non-labored, snoring resp's with periods of apnea prior to intubation Abdomen:  Round/soft, bsx4 active Musculoskeletal:  No acute deformities Skin:  Warm/dry  LABS:  Recent Labs Lab 02/23/13 1935 02/24/13 0538   02/25/13 1200 02/26/13 0415  02/26/13 1300 02/26/13 1612 02/26/13 1700 02/26/13 1745 02/26/13 1750 02/27/13 0340 02/27/13 0348  HGB 9.1* 8.6*  --  8.7*  --   --   --  8.2* 8.5*  --  9.2*  --  8.9*  WBC 9.1 7.8  --  7.5  --   --   --   --  6.9  --   --   --  12.2*  PLT 249 223  --  228  --   --   --   --  241  --   --   --  216  NA 135 138  < >  --  134*  --  137 137  --   --  137  --  137  K 4.7 4.8  < >  --  4.9  --  4.8 4.4  --   --  4.4  --  4.8  CL 100 103  < >  --  98  --  100  --   --   --   --   --  101  CO2 27 26  < >  --  26  --  25  --   --   --   --   --  26  GLUCOSE 168* 180*  < >  --  140*  --  130*  --   --   --  115*  --  118*  BUN 47* 51*  < >  --  63*  --  64*  --   --   --   --   --  67*  CREATININE 3.26* 3.38*  < > 3.32* 3.15*  --  3.28*  --   --   --   --   --  3.49*  CALCIUM 8.8 8.7  < >  --  8.8  --  8.9  --   --   --   --   --  8.5  AST  --   --   --   --   --   --  19  --   --   --   --   --   --   ALT  --   --   --   --   --   --  19  --   --   --   --   --   --   ALKPHOS  --   --   --   --   --   --  115  --   --   --   --   --   --   BILITOT  --   --   --   --   --   --  0.3  --   --   --   --   --   --   PROT  --   --   --   --   --   --  6.4  --   --   --   --   --   --   ALBUMIN  --   --   --   --   --   --  2.3*  --   --   --   --   --   --   APTT  --  55*  --   --   --   --   --   --   --   --   --   --   --   INR  --  1.07  --   --   --   --   --   --   --   --   --   --   --   PHART  --   --   < >  --   --   < >  --  7.467*  --  7.475*  --  7.391  --   PCO2ART  --   --   < >  --   --   < >  --  37.4  --  37.4  --  42.6  --   PO2ART  --   --   < >  --   --   < >  --  374.0*  --  415.0*  --  92.1  --   < > = values in this interval not displayed.  Recent Labs Lab 02/25/13 1146 02/25/13 1613 02/25/13 2250 02/26/13 0736 02/26/13 1238  GLUCAP 104* 98 140* 124* 128*    CXR: 5/21 >>>pending  ASSESSMENT / PLAN:  PULMONARY A: Acute  Respiratory Failure - in setting of progressive renal failure OSA - on CPAP at baseline  PAH - noted on RHC, 96/41 (59)  P:   - Maintain full vent support until after dialysis then reassess mental status (was intubated due to poor mental status from uremia). - PS as tolerated. - AM CXR. - ABG & pcxr in am. - SBT in AM.  CARDIOVASCULAR A:  Hypertensive Crisis CHF - with acute decompensation  P:  - CHF mgmt per Advanced Heart Failure Team. - Start Norvasc for BP control (remains on cardene drip).  RENAL A:   Acute Renal Failure - no hydronephrosis on renal US 5/19 Hyponatremia   P:   - HD per Nephrology, hopefully HD today now that patient has an indwelling catheter. - F/U bmp in am.  GASTROINTESTINAL A:   SUP   P:   - Protonix for DVT proph. - NPO as likely to extubate today or in AM, if not then will start TF.  HEMATOLOGIC A:   Anemia - in setting of chronic illness. No evidence of acute bleeding.   P:  - Monitor H/H - Consider tx if hgb <7%  INFECTIOUS A:   No evidence of acute infectious process.   P:   - Monitor fever curve/wbc.  ENDOCRINE A:   DM   P:   - ICU SSI.  NEUROLOGIC A:   Acute Encephalopathy - in setting of renal failure  P:   - Monitor, likely to improve post HD. - Precedex, no additional sedation given mental status.  I have personally obtained a history, examined the patient, evaluated laboratory and imaging results, formulated the assessment and plan and placed orders.  CRITICAL CARE: The patient is critically ill with multiple organ systems failure and requires high complexity decision making for assessment and support, frequent evaluation and titration of therapies, application of advanced monitoring technologies and extensive interpretation of multiple databases. Critical Care Time devoted to patient care services described in this note is 35 minutes.   02/27/2013, 9:35 AM  Alyson Reedy, M.D. [facility] Pulmonary/Critical  Care Medicine. Pager: [phone]. After hours pager: [phone].

## 2013-02-28 ENCOUNTER — Inpatient Hospital Stay (HOSPITAL_COMMUNITY): Payer: BC Managed Care – PPO

## 2013-02-28 ENCOUNTER — Encounter (HOSPITAL_COMMUNITY)
Admission: EM | Disposition: A | Discharge status: Home Health | Payer: Self-pay | Source: Home / Self Care | Attending: Internal Medicine

## 2013-02-28 LAB — GLUCOSE, CAPILLARY
Glucose-Capillary: 118 mg/dL — ABNORMAL HIGH (ref 70–99)
Glucose-Capillary: 109 mg/dL — ABNORMAL HIGH (ref 70–99)

## 2013-02-28 LAB — POCT I-STAT 3, ART BLOOD GAS (G3+)
Bicarbonate: 29.6 mEq/L — ABNORMAL HIGH (ref 20.0–24.0)
Patient temperature: 36.6
pH, Arterial: 7.487 — ABNORMAL HIGH (ref 7.350–7.450)
pO2, Arterial: 99 mmHg (ref 80.0–100.0)

## 2013-02-28 LAB — MAGNESIUM: Magnesium: 2 mg/dL (ref 1.5–2.5)

## 2013-02-28 LAB — BASIC METABOLIC PANEL
BUN: 36 mg/dL — ABNORMAL HIGH (ref 6–23)
Calcium: 8.6 mg/dL (ref 8.4–10.5)
Creatinine, Ser: 2.25 mg/dL — ABNORMAL HIGH (ref 0.50–1.10)
GFR calc non Af Amer: 24 mL/min — ABNORMAL LOW (ref >90)
Glucose, Bld: 130 mg/dL — ABNORMAL HIGH (ref 70–99)
Potassium: 4 mEq/L (ref 3.5–5.1)

## 2013-02-28 LAB — CBC
Hemoglobin: 9 g/dL — ABNORMAL LOW (ref 12.0–15.0)
MCH: 27.1 pg (ref 26.0–34.0)
MCHC: 32.3 g/dL (ref 30.0–36.0)
RDW: 17.9 % — ABNORMAL HIGH (ref 11.5–15.5)

## 2013-02-28 SURGERY — CREATION, PERICARDIAL WINDOW
Anesthesia: General

## 2013-02-28 MED ORDER — HYDRALAZINE HCL 25 MG PO TABS
25.0000 mg | ORAL_TABLET | Freq: Three times a day (TID) | ORAL | Status: DC
  Administered 2013-02-28 – 2013-03-02 (×3): 25 mg via ORAL
  Filled 2013-02-28 (×8): qty 1

## 2013-02-28 MED ORDER — AMLODIPINE BESYLATE 10 MG PO TABS
10.0000 mg | ORAL_TABLET | Freq: Every day | ORAL | Status: DC
  Administered 2013-03-02 – 2013-03-06 (×4): 10 mg via ORAL
  Filled 2013-02-28 (×6): qty 1

## 2013-02-28 MED ORDER — AMLODIPINE 1 MG/ML ORAL SUSPENSION: 5.0000 mg | Freq: Once | ORAL | Status: DC

## 2013-02-28 MED ORDER — DARBEPOETIN ALFA-POLYSORBATE 60 MCG/0.3ML IJ SOLN
60.0000 ug | INTRAMUSCULAR | Status: DC
  Administered 2013-02-28: 60 ug via SUBCUTANEOUS
  Filled 2013-02-28: qty 0.3

## 2013-02-28 MED ORDER — AMLODIPINE BESYLATE 5 MG PO TABS
5.0000 mg | ORAL_TABLET | Freq: Once | ORAL | Status: AC
  Administered 2013-02-28: 5 mg via ORAL
  Filled 2013-02-28: qty 1

## 2013-02-28 MED FILL — Perflutren Lipid Microsphere IV Susp 1.1 MG/ML: INTRAVENOUS | Qty: 10 | Status: AC

## 2013-02-28 NOTE — Procedures (Signed)
Extubation Procedure Note  Patient Details:   Name: KANIA REGNIER DOB: [birth year] MRN: [id number]   Airway Documentation: Pt extubated per MD order. Pt alert. Cuff leak present prior to extubation. Pt extubated to 2L [place] Sats 98%. No stridor noted. Pt able to verbalize name and DOB. HR: 65 RR: 16. Clear/Diminished BBS. Pt is stable at this time. RT will continue to monitor.   Evaluation  O2 sats: stable throughout Complications: No apparent complications Patient did tolerate procedure well. Bilateral Breath Sounds: Diminished Suctioning: Oral;Airway Yes  Elmer Picker 02/28/2013, 7:51 PM

## 2013-02-28 NOTE — Progress Notes (Addendum)
PULMONARY  / CRITICAL CARE MEDICINE  Name: Gail Chapman MRN: [id number] DOB: 07-13-1956    ADMISSION DATE:  02/19/2013 CONSULTATION DATE:  02/26/13  REFERRING MD :  Dr. Shirlee Latch PRIMARY SERVICE: Cardiology  CHIEF COMPLAINT:  Altered Mental Status  BRIEF PATIENT DESCRIPTION:  57 y/o F with PMH of poorly controlled HTN, DM,  Diastolic CHF, initially admitted to [facility] with a 2-3 month hx of progressive weight gain & dyspnea, diuresed and discharged to Rehab.  Worsened at Rehab and was transferred to [facility] 5/14 for further care.  5/21 had progressive lethargy in the setting of worsened renal failure / antianxiety meds.  Questionable dx of amyloidosis.  PCCM consulted for AMS / Acute Respiratory Failure.    SIGNIFICANT EVENTS / STUDIES:  5/14 - Admit to [facility] with progressive renal fx, worsening swelling / wt gain, dyspnea 5/15 - Ultrafiltration initiated, continued until 5/19 5/20 - RHC >>Marked elevation of RV/LV pressures, Pulmonary HTN, Nml Cardiac output  LINES / TUBES: OETT 5/21>>> L IJ Tunneled HD catheter 5/21>>> Mediastinal chest tube 5/21>>>5/23 L Radial a-line 5/21>>>  CULTURES: 5/14 MRSA PCR>>>neg  ANTIBIOTICS:   SUBJECTIVE: Arousable and interactive, no complaints but sedated and intubated.  VITAL SIGNS: Temp:  [98.2 F (36.8 C)-100 F (37.8 C)] 99.3 F (37.4 C) (05/23 0744) Pulse Rate:  [62-73] 64 (05/23 0744) Resp:  [13-20] 17 (05/23 0744) BP: (132-169)/(55-78) 169/55 mmHg (05/23 0744) SpO2:  [97 %-100 %] 99 % (05/23 0744) Arterial Line BP: (156-194)/(54-75) 167/54 mmHg (05/23 0700) FiO2 (%):  [40 %] 40 % (05/23 0812) Weight:  [83.1 kg (183 lb 3.2 oz)-87.8 kg (193 lb 9 oz)] 83.1 kg (183 lb 3.2 oz) (05/23 0500)  HEMODYNAMICS:    VENTILATOR SETTINGS: Vent Mode:  [-] PRVC FiO2 (%):  [40 %] 40 % Set Rate:  [14 bmp] 14 bmp Vt Set:  [400 mL] 400 mL PEEP:  [5 cmH20] 5 cmH20 Pressure Support:  [5 cmH20-10 cmH20] 5 cmH20 Plateau Pressure:  [12 cmH20-17  cmH20] 17 cmH20  INTAKE / OUTPUT: Intake/Output     05/22 0701 - 05/23 0700 05/23 0701 - 05/24 0700   P.O.     I.V. (mL/kg) 1242.6 (15)    NG/GT 90    IV Piggyback 284    Total Intake(mL/kg) 1616.6 (19.5)    Urine (mL/kg/hr) 575 (0.3)    Emesis/NG output 300 (0.2)    Other 3869 (1.9)    Chest Tube 130 (0.1)    Total Output 4874     Net -3257.4            PHYSICAL EXAMINATION: General: chronically ill adult female, obese. Neuro: Arouses to name and more interactive, moves all ext spontaneously HEENT:  Mm pink/moist Cardiovascular:  s1s2 rrr, no m/r/g Lungs: Intubated, clear bilaterally. Abdomen:  Round/soft, bsx4 active. Musculoskeletal:  No acute deformities Skin:  Warm/dry  LABS:  Recent Labs Lab 02/23/13 1935 02/24/13 0538  02/26/13 1300 02/26/13 1612 02/26/13 1700 02/26/13 1745 02/26/13 1750 02/27/13 0340 02/27/13 0348 02/28/13 0400  HGB 9.1* 8.6*  < >  --  8.2* 8.5*  --  9.2*  --  8.9* 9.0*  WBC 9.1 7.8  < >  --   --  6.9  --   --   --  12.2* 12.1*  PLT 249 223  < >  --   --  241  --   --   --  216 211  NA 135 138  < > 137 137  --   --  137  --  137 135  K 4.7 4.8  < > 4.8 4.4  --   --  4.4  --  4.8 4.0  CL 100 103  < > 100  --   --   --   --   --  101 99  CO2 27 26  < > 25  --   --   --   --   --  26 23  GLUCOSE 168* 180*  < > 130*  --   --   --  115*  --  118* 130*  BUN 47* 51*  < > 64*  --   --   --   --   --  67* 36*  CREATININE 3.26* 3.38*  < > 3.28*  --   --   --   --   --  3.49* 2.25*  CALCIUM 8.8 8.7  < > 8.9  --   --   --   --   --  8.5 8.6  MG  --   --   --   --   --   --   --   --   --   --  2.0  PHOS  --   --   --   --   --   --   --   --   --   --  5.2*  AST  --   --   --  19  --   --   --   --   --   --   --   ALT  --   --   --  19  --   --   --   --   --   --   --   ALKPHOS  --   --   --  115  --   --   --   --   --   --   --   BILITOT  --   --   --  0.3  --   --   --   --   --   --   --   PROT  --   --   --  6.4  --   --   --   --   --    --   --   ALBUMIN  --   --   --  2.3*  --   --   --   --   --   --   --   APTT  --  55*  --   --   --   --   --   --   --   --   --   INR  --  1.07  --   --   --   --   --   --   --   --   --   PHART  --   --   < >  --  7.467*  --  7.475*  --  7.391  --   --   PCO2ART  --   --   < >  --  37.4  --  37.4  --  42.6  --   --   PO2ART  --   --   < >  --  374.0*  --  415.0*  --  92.1  --   --   < > = values in this  interval not displayed.  Recent Labs Lab 02/27/13 1522 02/27/13 1927 02/27/13 2333 02/28/13 0350 02/28/13 0725  GLUCAP 100* 108* 108* 118* 139*   CXR: 5/21 >>>pending  ASSESSMENT / PLAN:  PULMONARY A: Acute Respiratory Failure - in setting of progressive renal failure OSA - on CPAP at baseline  PAH - noted on RHC, 96/41 (59)  P:   - SBT today to extubate after patient has her dialysis session (first on list). - Will extubate then titrate O2 as needed. - Will need CPAP QHS post extubation.  CARDIOVASCULAR A:  Hypertensive Crisis CHF - with acute decompensation  P:  - CHF mgmt per Advanced Heart Failure Team. - Increase Norvasc to 10 mg daily with holding parameters and give an additional 5 mg PO x1 now (patient remains on cardene drip at 5 mg/hr).  RENAL A:   Acute Renal Failure - no hydronephrosis on renal US 5/19 Hyponatremia   P:   - First for HD today then will extubate post HD. - F/U bmp in am.  GASTROINTESTINAL A:   SUP   P:   - Protonix for DVT proph. - NPO as will extubate today then can begin diet (intubated for 2 days only).  HEMATOLOGIC A:   Anemia - in setting of chronic illness. No evidence of acute bleeding.   P:  - Monitor H/H - Consider tx if hgb <7%  INFECTIOUS A:   No evidence of acute infectious process.   P:   - Monitor fever curve/wbc.  ENDOCRINE A:   DM   P:   - ICU SSI.  NEUROLOGIC A:   Acute Encephalopathy - in setting of renal failure  P:   - Monitor, likely to improve post HD. - Precedex, no  additional sedation given mental status.  I have personally obtained a history, examined the patient, evaluated laboratory and imaging results, formulated the assessment and plan and placed orders.  CRITICAL CARE: The patient is critically ill with multiple organ systems failure and requires high complexity decision making for assessment and support, frequent evaluation and titration of therapies, application of advanced monitoring technologies and extensive interpretation of multiple databases. Critical Care Time devoted to patient care services described in this note is 35 minutes.   02/28/2013, 10:48 AM  Alyson Reedy, M.D. [facility] Pulmonary/Critical Care Medicine. Pager: [phone]. After hours pager: [phone].

## 2013-02-28 NOTE — Progress Notes (Signed)
Approx. 25cc Precedex wasted in sharps box.  Witnessed by Revonda Standard, RN

## 2013-02-28 NOTE — Progress Notes (Signed)
eLink Physician-Brief Progress Note Patient Name: Gail Chapman DOB: 01/18/1956 MRN: [id number]  Date of Service  02/28/2013   HPI/Events of Note  5L removed afrter HD. Per RN patient meets extubation criteria. On camera exam she appears stable   eICU Interventions   extubate    Intervention Category Intermediate Interventions: Other:  [name] 02/28/2013, 7:31 PM

## 2013-02-28 NOTE — Progress Notes (Signed)
Weaning process began this morning with respiratory therapy as per MD orders.  Renal MD to bedside, pt to have dialysis treatment number 2 today, MD advised me that extubation post dialysis would be ideal.  Will stop weaning process at this point and discuss with Pulmonary Critical Care MD on rounds.

## 2013-02-28 NOTE — Progress Notes (Signed)
Advanced Heart Failure Rounding Note   Subjective:    57 y/o female with a h/o severe HTN, DM, diastolic HF and PAH. Admitted 5/14 with HTN crisis (BP 260/120) and massive volume overload. Failed high dose lasix and ultrafiltration. RHC 5/20 with markedly elevated biventricular pressures c/w restrictive physiology.   On 5/21 underwent pericaridal window for large effusion and myocardial biopsy (exclude amyloid). Also intubated for respiratory failure and perm cath placed for initiation of HD.  Doing well with HD. Down 7L over 2 days. Result of myocardial biopsy not back yet. (urine with light chains, fat pad bx negative). Remains intubated but planning to extubate tonight. Still on cardene gtt. Norvasc increased this am.   Awake on vent. No complaints.     Objective:    Vital Signs:   Temp:  [98 F (36.7 C)-100 F (37.8 C)] 98.6 F (37 C) (05/23 1726) Pulse Rate:  [54-67] 64 (05/23 1822) Resp:  [13-29] 18 (05/23 1822) BP: (132-192)/(55-68) 177/58 mmHg (05/23 1822) SpO2:  [97 %-100 %] 99 % (05/23 1822) Arterial Line BP: (156-179)/(52-66) 179/66 mmHg (05/23 1200) FiO2 (%):  [40 %] 40 % (05/23 1822) Weight:  [83.1 kg (183 lb 3.2 oz)-84 kg (185 lb 3 oz)] 84 kg (185 lb 3 oz) (05/23 1426)    Weight change: Filed Weights   02/27/13 1454 02/28/13 0500 02/28/13 1426  Weight: 87.8 kg (193 lb 9 oz) 83.1 kg (183 lb 3.2 oz) 84 kg (185 lb 3 oz)    Intake/Output:   Intake/Output Summary (Last 24 hours) at 02/28/13 1831 Last data filed at 02/28/13 1726  Gross per 24 hour  Intake 1158.55 ml  Output   3700 ml  Net -2541.45 ml     Physical Exam:  General: Awake on vent Neuro:Moves all extremities spontaneously.  HEENT: ET tube. L chest perm cath L subclavian CVL Neck: Supple without bruits. JVP jaw. Carotids 2+ bilaterally without bruits  Lungs: Resp regular and unlabored, clear anteriorly Heart: RRR no s3, + s4, 2/6 TR  Abdomen: mildly distended. nontender, BS + x 4.   Extremities: No clubbing, cyanosis. 1+ bilat LEE up into thighs   Telemetry: SR 60-70s  Labs: Basic Metabolic Panel:  Recent Labs Lab 02/25/13 0453 02/25/13 1200 02/26/13 0415 02/26/13 1300 02/26/13 1612 02/26/13 1750 02/27/13 0348 02/28/13 0400  NA 138  --  134* 137 137 137 137 135  K 4.8  --  4.9 4.8 4.4 4.4 4.8 4.0  CL 103  --  98 100  --   --  101 99  CO2 25  --  26 25  --   --  26 23  GLUCOSE 115*  --  140* 130*  --  115* 118* 130*  BUN 55*  --  63* 64*  --   --  67* 36*  CREATININE 3.31* 3.32* 3.15* 3.28*  --   --  3.49* 2.25*  CALCIUM 8.6  --  8.8 8.9  --   --  8.5 8.6  MG  --   --   --   --   --   --   --  2.0  PHOS  --   --   --   --   --   --   --  5.2*    Liver Function Tests:  Recent Labs Lab 02/26/13 1300  AST 19  ALT 19  ALKPHOS 115  BILITOT 0.3  PROT 6.4  ALBUMIN 2.3*   No results found for this basename: LIPASE,  AMYLASE,  in the last 168 hours  Recent Labs Lab 02/26/13 0415  AMMONIA 23    CBC:  Recent Labs Lab 02/24/13 0538 02/25/13 1200 02/26/13 1612 02/26/13 1700 02/26/13 1750 02/27/13 0348 02/28/13 0400  WBC 7.8 7.5  --  6.9  --  12.2* 12.1*  HGB 8.6* 8.7* 8.2* 8.5* 9.2* 8.9* 9.0*  HCT 26.9* 26.9* 24.0* 26.7* 27.0* 27.8* 27.9*  MCV 85.7 84.9  --  82.9  --  83.2 84.0  PLT 223 228  --  241  --  216 211    Cardiac Enzymes: No results found for this basename: CKTOTAL, CKMB, CKMBINDEX, TROPONINI,  in the last 168 hours  BNP: BNP (last 3 results)  Recent Labs  02/19/13 1429 02/20/13 0500  PROBNP 45515.0* 57495.0*     Other results:   Imaging: Dg Chest Port 1 View  02/28/2013   *RADIOLOGY REPORT*  Clinical Data: Endotracheal tube position.  PORTABLE CHEST - 1 VIEW  Comparison: 02/27/2013.  Findings: The support apparatus is stable.  Pericardial drainage catheter is unchanged.  The heart remains enlarged.  Persistent vascular congestion, areas of atelectasis and probable left effusion.  IMPRESSION:  1.  Stable support  apparatus. 2.  Persistent vascular congestion, areas of atelectasis and probable left effusion.   Original Report Authenticated By: Rudie Meyer, M.D.   Dg Chest Portable 1 View In Am  02/27/2013   *RADIOLOGY REPORT*  Clinical Data: Postoperative chest x-ray  PORTABLE CHEST - 1 VIEW  Comparison: Prior chest x-ray 02/26/2013  Findings: The patient is intubated.  The tip of the endotracheal tube is 2.7 cm above the carina.  Incompletely imaged nasogastric tube.  The tip lies below the diaphragm, likely within the stomach. Subxiphoid approach left thoracostomy tube is in stable position. Left IJ tunneled hemodialysis catheter is also unchanged with the tip in the upper right atrium. Left subclavian approach central venous catheter also unchanged with the tip at the superior cavoatrial junction.  Stable enlargement of the cardiopericardial silhouette.  Small bilateral layering pleural effusions, background pulmonary edema and bibasilar opacities are not significantly changed.  No pneumothorax.  IMPRESSION:  1.  Overall, no significant interval change in the appearance of the chest with persistent enlargement the cardiopericardial silhouette, pulmonary edema, small bilateral layering effusions and associated bibasilar atelectasis versus infiltrate.  2.  Stable and satisfactory support apparatus   Original Report Authenticated By: Malachy Moan, M.D.     Medications:     Scheduled Medications: . acetaminophen  1,000 mg Oral Q6H   Or  . acetaminophen (TYLENOL) oral liquid 160 mg/5 mL  975 mg Per Tube Q6H  . [START ON 03/01/2013] amLODipine  10 mg Oral Daily  . aspirin EC  325 mg Oral Daily   Or  . aspirin  324 mg Per Tube Daily  . bisacodyl  10 mg Oral Daily   Or  . bisacodyl  10 mg Rectal Daily  . cefUROXime (ZINACEF)  IV  1.5 g Intravenous Q12H  . chlorhexidine  15 mL Mouth/Throat BID  . darbepoetin (ARANESP) injection - NON-DIALYSIS  60 mcg Subcutaneous Q Fri-1800  . docusate sodium  200 mg  Oral Daily  . insulin aspart  2-6 Units Subcutaneous Q4H  . metoprolol tartrate  12.5 mg Oral BID   Or  . metoprolol tartrate  12.5 mg Per Tube BID  . pantoprazole sodium  40 mg Per Tube Daily  . sodium chloride  3 mL Intravenous Q12H    Infusions: . sodium chloride  20 mL/hr at 02/28/13 1200  . sodium chloride    . sodium chloride    . dexmedetomidine 0.4 mcg/kg/hr (02/28/13 1433)  . lactated ringers    . niCARDipine 5 mg/hr (02/28/13 1808)    PRN Medications: sodium chloride, sodium chloride, metoprolol, midazolam, morphine injection, ondansetron (ZOFRAN) IV, oxyCODONE, sodium chloride   Assessment:   1. Acute on chronic diastolic chf with anasarca 2. Hypertensive crisis 3. Acute respiratory failure 4. Acute/chronic renal failure:  (baseline 2.5) Now on HD 5. DM 6. Large pericardial effusion - s/p window 5/21 7. Protein calorie malnutrition (albumin 2.3) 8. Urinary light chains      -Fat pad biopsy negative but inadequate sample.   Plan/Discussion:    Volume status much improved with HD. Stable on vent. Given her course over past few months, I think she will need long-term HD to control her volume status.   Pending extubation tonight. BP remains high. Agree with increasing norvasc. She has a listed allergy to hydralazine (floaters) but we used it previously this admit without difficulty. Will restart.  Await results of myocardial biopsy.  Gail Chapman 02/28/2013 6:31 PM

## 2013-02-28 NOTE — Op Note (Signed)
Gail Chapman, Gail Chapman               ACCOUNT NO.:  [id number]  MEDICAL RECORD NO.:  [id number]  LOCATION:  2302                         FACILITY:  [facility]  PHYSICIAN:  Sheliah Plane, MD    DATE OF BIRTH:  [birth year]  DATE OF PROCEDURE:  02/26/2013 DATE OF DISCHARGE:                              OPERATIVE REPORT   PREOPERATIVE DIAGNOSES:  Acute respiratory decompensation, acute renal failure, large pericardial effusion.  POSTOPERATIVE DIAGNOSES:  Acute respiratory decompensation, acute renal failure, large pericardial effusion.  PROCEDURES: 1. Subxiphoid pericardial window with drainage of pericardial effusion     and myocardial biopsy. 2. Placement of left internal jugular tunneled dialysis catheter with     fluoroscopic control and ultrasound localization of the jugular     vein.  SURGEON:  Sheliah Plane, M.D.  FIRST ASSISTANT:  Coral Ceo, P.A.  BRIEF HISTORY:  The patient is a 57 year old female who has been hospitalized for several days with renal failure and large pericardial effusion, admitted with congestive heart failure.  The patient had an acute decompensation while at the bed side.  Critical Care Service was attempting to place a temporary dialysis catheter with the known large pericardial effusion and need for dialysis.  Urgent cardiac surgery consultation was obtained.  With the patient's large pericardial effusion, now intubated concern for possible emboli and need for a dialysis catheter.  Surgical intervention was discussed with the patient's husband including placement of the temporary dialysis catheter and drainage of the large pericardial effusion, and biopsy of the myocardium to rule out emboli.  The risks and options were discussed with the patient's husband, as the patient was recently intubated and unconscious.  He was agreeable with the approach and signed the informed consent for his wife.  DESCRIPTION OF PROCEDURE:  The patient was brought to  the operating room already intubated.  Dr. Jacklynn Bue placed a TEE probe confirming a large pericardial effusion with some right atrial collapse but without frank tamponade.  Appropriate time-out was performed.  The patient's chest and neck was prepped with Betadine and draped in sterile manner.  We proceeded first with a small incision over the xiphoid process down to the pericardium.  The pericardium was opened and a 500-600 mL of straw- colored fluid was removed.  This fluid was sent for cytology, cell count, LDH, glucose, protein, and cultures and smears.  Through this incision, we had adequate visualization of the inferior surface of the heart to obtain a myocardial biopsy.  A small pledgeted suture was placed and using a Tru-Cut needle, a full-thickness myocardial biopsy was obtained.  The pledgeted suture was secured and there was very minimal bleeding.  A 28-Blake drain was left in the pericardial space and brought out adjacent to the incision.  The fascia was then closed with interrupted 0 Vicryl, running 2-0 Vicryl, and a subcuticular stitch.  Dermabond was applied.  We then turned attention to the placement of the dialysis catheter.  The left internal jugular vein was identified with SonoSite device and a guidewire was placed into the vein and with fluoroscopic control, positioned in the superior vena cava. Serial dilators were placed over the wire.  A small incision was made over  the left anterior chest.  A tunneling device was used to connect the 2 sites.  A peel-away sheath was positioned in the superior vena cava over the guidewire, and through this, a dual-lumen cuffed dialysis catheter was positioned and tunneled subcutaneously with the cup placed just under the skin edge.  At the entry site, there was good backflow of blood through both lumens.  The catheter was trimmed to the appropriate length and connectors were securely attached.  The catheter was flushed with heparin  solution.  Fluoroscopy control showed good position of the catheter and no evidence of pneumothorax.  The catheter was secured in place.  Dermabond was placed at each of the incision sites.  The patient was then left intubated and transferred to the Surgical Intensive Care Unit for further postoperative care.  Blood loss was minimal.  Sponge and needle count was reported as correct at completion of procedure. The patient tolerated the procedure without obvious complication.     Sheliah Plane, MD     EG/MEDQ  D:  02/28/2013  T:  02/28/2013  Job:  [id number]

## 2013-02-28 NOTE — Progress Notes (Signed)
[address]            [place] [address]          [phone]     2 Days Post-Op Procedure(s) (LRB): SUBXYPHOID PERICARDIAL WINDOW (N/A)  LOS: 9 days   Subjective: Remains on vent, neuro intact   Objective: Vital signs in last 24 hours: Patient Vitals for the past 24 hrs:  BP Temp Temp src Pulse Resp SpO2 Weight  02/28/13 0744 169/55 mmHg 99.3 F (37.4 C) - 64 17 99 % -  02/28/13 0700 150/62 mmHg 99.5 F (37.5 C) - 63 17 98 % -  02/28/13 0600 146/61 mmHg 99.7 F (37.6 C) - 64 19 97 % -  02/28/13 0500 159/61 mmHg 99.9 F (37.7 C) - 64 18 98 % 183 lb 3.2 oz (83.1 kg)  02/28/13 0400 159/63 mmHg 100 F (37.8 C) Core 65 15 99 % -  02/28/13 0300 155/60 mmHg 100 F (37.8 C) - 64 18 99 % -  02/28/13 0200 155/62 mmHg 100 F (37.8 C) - 66 20 98 % -  02/28/13 0100 153/61 mmHg 99.9 F (37.7 C) - 63 17 99 % -  02/28/13 0000 141/55 mmHg 99.7 F (37.6 C) Core 63 17 98 % -  02/27/13 2300 146/56 mmHg 99.5 F (37.5 C) - 64 18 99 % -  02/27/13 2200 154/66 mmHg 100 F (37.8 C) - 66 17 99 % -  02/27/13 2100 148/65 mmHg 99.9 F (37.7 C) - 65 15 99 % -  02/27/13 2000 148/64 mmHg 99.7 F (37.6 C) Core 65 18 98 % -  02/27/13 1900 132/59 mmHg 99.5 F (37.5 C) - 62 15 98 % -  02/27/13 1800 144/63 mmHg 99.1 F (37.3 C) Core 66 16 97 % -  02/27/13 1748 - 99 F (37.2 C) - 66 15 97 % -  02/27/13 1700 142/60 mmHg 98.8 F (37.1 C) Core 65 13 97 % -  02/27/13 1600 144/59 mmHg 98.8 F (37.1 C) Core 67 14 98 % -  02/27/13 1539 - 99 F (37.2 C) - 67 14 98 % -  02/27/13 1512 - 99 F (37.2 C) - 69 15 99 % -  02/27/13 1500 144/68 mmHg 99 F (37.2 C) Core 68 16 99 % -  02/27/13 1454 144/68 mmHg 99 F (37.2 C) - 65 14 99 % 193 lb 9 oz (87.8 kg)  02/27/13 1445 143/68 mmHg 99 F (37.2 C) - 66 16 99 % -  02/27/13 1430 153/67 mmHg 99 F (37.2 C) - 71 15 99 % -  02/27/13 1415 162/73 mmHg 99 F (37.2 C) - 73 15 100 % -  02/27/13 1400 167/78 mmHg 99 F (37.2 C)  - 73 16 100 % -  02/27/13 1345 155/76 mmHg 99 F (37.2 C) - 72 16 100 % -  02/27/13 1330 154/69 mmHg 99 F (37.2 C) - 70 16 100 % -  02/27/13 1315 157/73 mmHg 98.8 F (37.1 C) - 69 15 100 % -  02/27/13 1300 156/74 mmHg 98.8 F (37.1 C) - 68 16 100 % -  02/27/13 1245 149/68 mmHg 98.8 F (37.1 C) - 66 16 100 % -  02/27/13 1230 151/71 mmHg 98.6 F (37 C) - 69 18 100 % -  02/27/13 1219 - 98.6 F (37 C) - 66 17 99 % -  02/27/13 1215  151/60 mmHg 98.6 F (37 C) - 66 16 99 % -  02/27/13 1200 146/62 mmHg 98.6 F (37 C) - 64 18 99 % -  02/27/13 1145 141/61 mmHg 98.4 F (36.9 C) - 63 15 99 % -  02/27/13 1130 144/61 mmHg 98.4 F (36.9 C) - 63 15 99 % -  02/27/13 1115 144/67 mmHg 98.2 F (36.8 C) - 63 15 98 % -  02/27/13 1100 133/61 mmHg 98.2 F (36.8 C) - 63 14 98 % -  02/27/13 1056 133/61 mmHg 98.2 F (36.8 C) - 63 15 98 % -  02/27/13 1045 128/57 mmHg 98.2 F (36.8 C) Axillary 75 14 98 % 201 lb 15.1 oz (91.6 kg)  02/27/13 1000 135/60 mmHg 98.1 F (36.7 C) Core 65 15 98 % -    Filed Weights   02/27/13 1045 02/27/13 1454 02/28/13 0500  Weight: 201 lb 15.1 oz (91.6 kg) 193 lb 9 oz (87.8 kg) 183 lb 3.2 oz (83.1 kg)    Hemodynamic parameters for last 24 hours:    Intake/Output from previous day: 05/22 0701 - 05/23 0700 In: 1616.6 [I.V.:1242.6; NG/GT:90; IV Piggyback:284] Out: 4874 [Urine:575; Emesis/NG output:300; Chest Tube:130] Intake/Output this shift:    Scheduled Meds: . acetaminophen  1,000 mg Oral Q6H   Or  . acetaminophen (TYLENOL) oral liquid 160 mg/5 mL  975 mg Per Tube Q6H  . amLODipine  5 mg Oral Daily  . aspirin EC  325 mg Oral Daily   Or  . aspirin  324 mg Per Tube Daily  . bisacodyl  10 mg Oral Daily   Or  . bisacodyl  10 mg Rectal Daily  . cefUROXime (ZINACEF)  IV  1.5 g Intravenous Q12H  . chlorhexidine  15 mL Mouth/Throat BID  . darbepoetin (ARANESP) injection - NON-DIALYSIS  60 mcg Subcutaneous Q Fri-1800  . docusate sodium  200 mg Oral Daily  .  insulin aspart  2-6 Units Subcutaneous Q4H  . metoprolol tartrate  12.5 mg Oral BID   Or  . metoprolol tartrate  12.5 mg Per Tube BID  . pantoprazole sodium  40 mg Per Tube Daily  . sodium chloride  3 mL Intravenous Q12H   Continuous Infusions: . sodium chloride 20 mL/hr at 02/28/13 0700  . sodium chloride    . sodium chloride    . dexmedetomidine 0.302 mcg/kg/hr (02/28/13 0700)  . lactated ringers    . niCARDipine 2.5 mg/hr (02/28/13 0700)   PRN Meds:.sodium chloride, sodium chloride, metoprolol, midazolam, morphine injection, ondansetron (ZOFRAN) IV, oxyCODONE, sodium chloride    Lab Results: CBC: Recent Labs  02/27/13 0348 02/28/13 0400  WBC 12.2* 12.1*  HGB 8.9* 9.0*  HCT 27.8* 27.9*  PLT 216 211   BMET:  Recent Labs  02/27/13 0348 02/28/13 0400  NA 137 135  K 4.8 4.0  CL 101 99  CO2 26 23  GLUCOSE 118* 130*  BUN 67* 36*  CREATININE 3.49* 2.25*  CALCIUM 8.5 8.6    PT/INR: No results found for this basename: LABPROT, INR,  in the last 72 hours   Radiology Dg Chest [facility] 1 View  02/28/2013   *RADIOLOGY REPORT*  Clinical Data: Endotracheal tube position.  PORTABLE CHEST - 1 VIEW  Comparison: 02/27/2013.  Findings: The support apparatus is stable.  Pericardial drainage catheter is unchanged.  The heart remains enlarged.  Persistent vascular congestion, areas of atelectasis and probable left effusion.  IMPRESSION:  1.  Stable support apparatus. 2.  Persistent vascular congestion, areas  of atelectasis and probable left effusion.   Original Report Authenticated By: Rudie Meyer, M.D.   Dg Chest Portable 1 View In Am  02/27/2013   *RADIOLOGY REPORT*  Clinical Data: Postoperative chest x-ray  PORTABLE CHEST - 1 VIEW  Comparison: Prior chest x-ray 02/26/2013  Findings: The patient is intubated.  The tip of the endotracheal tube is 2.7 cm above the carina.  Incompletely imaged nasogastric tube.  The tip lies below the diaphragm, likely within the stomach. Subxiphoid  approach left thoracostomy tube is in stable position. Left IJ tunneled hemodialysis catheter is also unchanged with the tip in the upper right atrium. Left subclavian approach central venous catheter also unchanged with the tip at the superior cavoatrial junction.  Stable enlargement of the cardiopericardial silhouette.  Small bilateral layering pleural effusions, background pulmonary edema and bibasilar opacities are not significantly changed.  No pneumothorax.  IMPRESSION:  1.  Overall, no significant interval change in the appearance of the chest with persistent enlargement the cardiopericardial silhouette, pulmonary edema, small bilateral layering effusions and associated bibasilar atelectasis versus infiltrate.  2.  Stable and satisfactory support apparatus   Original Report Authenticated By: Malachy Moan, M.D.   Dg Chest Portable 1 View  02/26/2013   *RADIOLOGY REPORT*  Clinical Data: Postop pericardial window.  PORTABLE CHEST - 1 VIEW  Comparison: 02/20/2013  Findings: Endotracheal tube in good position.  Left jugular catheter tips in the right atrium.  NG tube enters the stomach with the tip not visualized.  Pericardial drain in good position.  Congestive heart failure with edema shows mild interval improvement.  No pneumothorax.  IMPRESSION: Support lines in good position.  Congestive heart failure with edema, showing interval improvement.   Original Report Authenticated By: Janeece Riggers, M.D.   Dg Fluoro Guide Cv Line-no Report  02/26/2013   CLINICAL DATA: HD Cath   FLOURO GUIDE CV LINE  Fluoroscopy was utilized by the requesting physician.  No radiographic  interpretation.      Assessment/Plan: S/P Procedure(s) (LRB): SUBXYPHOID PERICARDIAL WINDOW (N/A) D/c pericardial tube Wean vent as tolerated Path still pending  Delight Ovens MD 02/28/2013 9:36 AM

## 2013-02-28 NOTE — Progress Notes (Signed)
Pt weaning at this time on Pressure support 5/5. Gas drawn after 30 mins and results posted to chart, e-md called and advised me that he would call back with orders as to whether we will extubate or not.  Pt reported to oncoming RN and will continue to monitor closely.

## 2013-02-28 NOTE — Progress Notes (Signed)
Patient ID: Mee Hives, female   DOB: [birth year], 57 y.o.   MRN: [id number]   [facility] Progress Note    Subjective:   No acute events overnight, tolerated dialysis without problems. Currently weaning off of the ventilator.    Objective:   BP 169/55  Pulse 64  Temp(Src) 99.3 F (37.4 C) (Core (Comment))  Resp 17  Ht 5' 1.5" (1.562 m)  Wt 83.1 kg (183 lb 3.2 oz)  BMI 34.06 kg/m2  SpO2 99%  Intake/Output Summary (Last 24 hours) at 02/28/13 0809 Last data filed at 02/28/13 0700  Gross per 24 hour  Intake 1556.33 ml  Output   4814 ml  Net -3257.67 ml   Weight change: 2.8 kg (6 lb 2.8 oz)  Physical Exam: Gen: Intubated, awake and alert CVS: Pulse regular in rate and rhythm, heart sounds S1 and S2 normal. Pericardial drain in situ Resp: Anteriorly clear to auscultation bilaterally-no rales/rhonchi Abd: Soft, obese, nontender Ext: 2+ lower extremity edema, 3+ upper extremity edema  Imaging: Dg Chest Port 1 View  02/28/2013   *RADIOLOGY REPORT*  Clinical Data: Endotracheal tube position.  PORTABLE CHEST - 1 VIEW  Comparison: 02/27/2013.  Findings: The support apparatus is stable.  Pericardial drainage catheter is unchanged.  The heart remains enlarged.  Persistent vascular congestion, areas of atelectasis and probable left effusion.  IMPRESSION:  1.  Stable support apparatus. 2.  Persistent vascular congestion, areas of atelectasis and probable left effusion.   Original Report Authenticated By: Rudie Meyer, M.D.   Dg Chest Portable 1 View In Am  02/27/2013   *RADIOLOGY REPORT*  Clinical Data: Postoperative chest x-ray  PORTABLE CHEST - 1 VIEW  Comparison: Prior chest x-ray 02/26/2013  Findings: The patient is intubated.  The tip of the endotracheal tube is 2.7 cm above the carina.  Incompletely imaged nasogastric tube.  The tip lies below the diaphragm, likely within the stomach. Subxiphoid approach left thoracostomy tube is in stable position. Left IJ tunneled  hemodialysis catheter is also unchanged with the tip in the upper right atrium. Left subclavian approach central venous catheter also unchanged with the tip at the superior cavoatrial junction.  Stable enlargement of the cardiopericardial silhouette.  Small bilateral layering pleural effusions, background pulmonary edema and bibasilar opacities are not significantly changed.  No pneumothorax.  IMPRESSION:  1.  Overall, no significant interval change in the appearance of the chest with persistent enlargement the cardiopericardial silhouette, pulmonary edema, small bilateral layering effusions and associated bibasilar atelectasis versus infiltrate.  2.  Stable and satisfactory support apparatus   Original Report Authenticated By: Malachy Moan, M.D.   Dg Chest Portable 1 View  02/26/2013   *RADIOLOGY REPORT*  Clinical Data: Postop pericardial window.  PORTABLE CHEST - 1 VIEW  Comparison: 02/20/2013  Findings: Endotracheal tube in good position.  Left jugular catheter tips in the right atrium.  NG tube enters the stomach with the tip not visualized.  Pericardial drain in good position.  Congestive heart failure with edema shows mild interval improvement.  No pneumothorax.  IMPRESSION: Support lines in good position.  Congestive heart failure with edema, showing interval improvement.   Original Report Authenticated By: Janeece Riggers, M.D.   Dg Fluoro Guide Cv Line-no Report  02/26/2013   CLINICAL DATA: HD Cath   FLOURO GUIDE CV LINE  Fluoroscopy was utilized by the requesting physician.  No radiographic  interpretation.     Labs: BMET  Recent Labs Lab 02/23/13 1935 02/24/13 0538 02/25/13 0453 02/25/13 1200  02/26/13 0415 02/26/13 1300 02/26/13 1612 02/26/13 1750 02/27/13 0348 02/28/13 0400  NA 135 138 138  --  134* 137 137 137 137 135  K 4.7 4.8 4.8  --  4.9 4.8 4.4 4.4 4.8 4.0  CL 100 103 103  --  98 100  --   --  101 99  CO2 27 26 25   --  26 25  --   --  26 23  GLUCOSE 168* 180* 115*  --   140* 130*  --  115* 118* 130*  BUN 47* 51* 55*  --  63* 64*  --   --  67* 36*  CREATININE 3.26* 3.38* 3.31* 3.32* 3.15* 3.28*  --   --  3.49* 2.25*  CALCIUM 8.8 8.7 8.6  --  8.8 8.9  --   --  8.5 8.6  PHOS  --   --   --   --   --   --   --   --   --  5.2*   CBC  Recent Labs Lab 02/25/13 1200  02/26/13 1700 02/26/13 1750 02/27/13 0348 02/28/13 0400  WBC 7.5  --  6.9  --  12.2* 12.1*  HGB 8.7*  < > 8.5* 9.2* 8.9* 9.0*  HCT 26.9*  < > 26.7* 27.0* 27.8* 27.9*  MCV 84.9  --  82.9  --  83.2 84.0  PLT 228  --  241  --  216 211  < > = values in this interval not displayed.  Medications:    . acetaminophen  1,000 mg Oral Q6H   Or  . acetaminophen (TYLENOL) oral liquid 160 mg/5 mL  975 mg Per Tube Q6H  . amLODipine  5 mg Oral Daily  . aspirin EC  325 mg Oral Daily   Or  . aspirin  324 mg Per Tube Daily  . bisacodyl  10 mg Oral Daily   Or  . bisacodyl  10 mg Rectal Daily  . cefUROXime (ZINACEF)  IV  1.5 g Intravenous Q12H  . chlorhexidine  15 mL Mouth/Throat BID  . docusate sodium  200 mg Oral Daily  . insulin aspart  2-6 Units Subcutaneous Q4H  . metoprolol tartrate  12.5 mg Oral BID   Or  . metoprolol tartrate  12.5 mg Per Tube BID  . pantoprazole sodium  40 mg Per Tube Daily  . sodium chloride  3 mL Intravenous Q12H     Assessment/ Plan:   1. Acute renal failure chronic kidney disease: Suspect prominent cardiorenal component to this-unfortunately has failed isolated ultrafiltration and trials of diuretics. For dialysis again today for volume management and hopefully improve the success of extubation. 2. Diastolic dysfunction/congestive heart failure: Volume removal with dialysis given limitations of diuretics/ultrafiltration so far. 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  3. Anemia: Possibly anemia of chronic disease/CK D.- s/p IV Fe. Start ESA 4. Ventilator dependent respiratory failure:  Ultrafiltration today with hopes of extubating later.   Zetta Bills, MD 02/28/2013, 8:09 AM

## 2013-03-01 ENCOUNTER — Inpatient Hospital Stay (HOSPITAL_COMMUNITY): Payer: BC Managed Care – PPO

## 2013-03-01 LAB — CBC
HCT: 27.3 % — ABNORMAL LOW (ref 36.0–46.0)
MCV: 84 fL (ref 78.0–100.0)
RBC: 3.25 MIL/uL — ABNORMAL LOW (ref 3.87–5.11)
WBC: 15.1 10*3/uL — ABNORMAL HIGH (ref 4.0–10.5)

## 2013-03-01 LAB — PHOSPHORUS: Phosphorus: 4.3 mg/dL (ref 2.3–4.6)

## 2013-03-01 LAB — BASIC METABOLIC PANEL
Calcium: 8.5 mg/dL (ref 8.4–10.5)
GFR calc Af Amer: 38 mL/min — ABNORMAL LOW (ref >90)
GFR calc non Af Amer: 33 mL/min — ABNORMAL LOW (ref >90)
Glucose, Bld: 122 mg/dL — ABNORMAL HIGH (ref 70–99)
Potassium: 3.3 mEq/L — ABNORMAL LOW (ref 3.5–5.1)

## 2013-03-01 LAB — GLUCOSE, CAPILLARY
Glucose-Capillary: 101 mg/dL — ABNORMAL HIGH (ref 70–99)
Glucose-Capillary: 105 mg/dL — ABNORMAL HIGH (ref 70–99)
Glucose-Capillary: 108 mg/dL — ABNORMAL HIGH (ref 70–99)
Glucose-Capillary: 155 mg/dL — ABNORMAL HIGH (ref 70–99)

## 2013-03-01 LAB — MAGNESIUM: Magnesium: 2 mg/dL (ref 1.5–2.5)

## 2013-03-01 MED ORDER — ISOSORBIDE DINITRATE 10 MG PO TABS
10.0000 mg | ORAL_TABLET | Freq: Three times a day (TID) | ORAL | Status: DC
  Administered 2013-03-01 – 2013-03-03 (×6): 10 mg via ORAL
  Filled 2013-03-01 (×12): qty 1

## 2013-03-01 MED ORDER — OXYCODONE HCL 5 MG PO TABS
ORAL_TABLET | ORAL | Status: AC
  Administered 2013-03-01: 10 mg via ORAL
  Filled 2013-03-01: qty 2

## 2013-03-01 NOTE — Progress Notes (Signed)
Patient ID: Gail Chapman, female   DOB: 1955/10/16, 57 y.o.   MRN: [id number] Advanced Heart Failure Rounding Note   Subjective:    57 y/o female with a h/o severe HTN, DM, diastolic HF and PAH. Admitted 5/14 with HTN crisis (BP 260/120) and massive volume overload. Failed high dose lasix and ultrafiltration. RHC 5/20 with markedly elevated biventricular pressures c/w restrictive physiology.   On 5/21 underwent pericaridal window for large effusion and myocardial biopsy (exclude amyloid). Also intubated for respiratory failure and perm cath placed for initiation of HD.  Doing well with HD. Down 7L over 2 days. Result of myocardial biopsy not back yet. (urine with light chains, fat pad bx negative).   Extubated Sitting in chair with no complaints To get dialysis today   Objective:    Vital Signs:   Temp:  [97.2 F (36.2 C)-98.6 F (37 C)] 97.5 F (36.4 C) (05/24 1000) Pulse Rate:  [54-71] 67 (05/24 1000) Resp:  [5-29] 11 (05/24 1000) BP: (146-192)/(56-121) 159/62 mmHg (05/24 1000) SpO2:  [94 %-100 %] 100 % (05/24 1000) Arterial Line BP: (162-191)/(52-67) 183/60 mmHg (05/24 1000) FiO2 (%):  [40 %] 40 % (05/23 1930) Weight:  [182 lb 8.7 oz (82.8 kg)-185 lb 3 oz (84 kg)] 182 lb 8.7 oz (82.8 kg) (05/24 0700)    Weight change: Filed Weights   02/28/13 0500 02/28/13 1426 03/01/13 0700  Weight: 183 lb 3.2 oz (83.1 kg) 185 lb 3 oz (84 kg) 182 lb 8.7 oz (82.8 kg)    Intake/Output:   Intake/Output Summary (Last 24 hours) at 03/01/13 1035 Last data filed at 03/01/13 1000  Gross per 24 hour  Intake 1591.95 ml  Output   3554 ml  Net -1962.05 ml     Physical Exam:  General: Alert chronically ill black female Neuro: Normal and non focal.  HEENT:  L chest perm cath L subclavian CVL Neck: Supple without bruits. JVP jaw. Carotids 2+ bilaterally without bruits  Lungs: Resp regular and unlabored, clear anteriorly Heart: RRR no s3, + s4, 2/6 TR  And SEM Abdomen: mildly distended.  nontender, BS + x 4.  Extremities: No clubbing, cyanosis. 1+ bilat LEE up into thighs   Telemetry: SR 60-70s  Labs: Basic Metabolic Panel:  Recent Labs Lab 02/26/13 0415 02/26/13 1300 02/26/13 1612 02/26/13 1750 02/27/13 0348 02/28/13 0400 03/01/13 0400  NA 134* 137 137 137 137 135 138  K 4.9 4.8 4.4 4.4 4.8 4.0 3.3*  CL 98 100  --   --  101 99 102  CO2 26 25  --   --  26 23 24   GLUCOSE 140* 130*  --  115* 118* 130* 122*  BUN 63* 64*  --   --  67* 36* 23  CREATININE 3.15* 3.28*  --   --  3.49* 2.25* 1.72*  CALCIUM 8.8 8.9  --   --  8.5 8.6 8.5  MG  --   --   --   --   --  2.0 2.0  PHOS  --   --   --   --   --  5.2* 4.3    Liver Function Tests:  Recent Labs Lab 02/26/13 1300  AST 19  ALT 19  ALKPHOS 115  BILITOT 0.3  PROT 6.4  ALBUMIN 2.3*   No results found for this basename: LIPASE, AMYLASE,  in the last 168 hours  Recent Labs Lab 02/26/13 0415  AMMONIA 23    CBC:  Recent Labs Lab 02/25/13  1200  02/26/13 1700 02/26/13 1750 02/27/13 0348 02/28/13 0400 03/01/13 0400  WBC 7.5  --  6.9  --  12.2* 12.1* 15.1*  HGB 8.7*  < > 8.5* 9.2* 8.9* 9.0* 9.0*  HCT 26.9*  < > 26.7* 27.0* 27.8* 27.9* 27.3*  MCV 84.9  --  82.9  --  83.2 84.0 84.0  PLT 228  --  241  --  216 211 262  < > = values in this interval not displayed.  Cardiac Enzymes: No results found for this basename: CKTOTAL, CKMB, CKMBINDEX, TROPONINI,  in the last 168 hours  BNP: BNP (last 3 results)  Recent Labs  02/19/13 1429 02/20/13 0500  PROBNP 45515.0* 57495.0*     Other results:   Imaging: Dg Chest Port 1 View  03/01/2013   *RADIOLOGY REPORT*  Clinical Data: Respiratory failure, extubation  PORTABLE CHEST - 1 VIEW  Comparison: 02/28/2013; 02/27/2013; 02/26/2013  Findings: Grossly unchanged enlarged cardiac silhouette and mediastinal contours.  Interval extubation and removal of left- sided chest tube and enteric tube.  Stable positioning of left jugular approach dialysis  catheter and left subclavian approach central venous catheter.  Mild cephalization of flow without frank evidence of edema.  Improved aeration of the bilateral lung bases with persistent left basilar/retrocardiac opacities.  No definite pleural effusion or pneumothorax.  Unchanged bones.  IMPRESSION: 1.  Interval extubation and removal support apparatus as above.  No pneumothorax. 2.  Pulmonary venous congestion without frank evidence of edema. 3.  Overall improved aeration of the lungs with persistent left basilar/retrocardiac opacities, atelectasis versus infiltrate.   Original Report Authenticated By: Tacey Ruiz, MD   Dg Chest Port 1 View  02/28/2013   *RADIOLOGY REPORT*  Clinical Data: Endotracheal tube position.  PORTABLE CHEST - 1 VIEW  Comparison: 02/27/2013.  Findings: The support apparatus is stable.  Pericardial drainage catheter is unchanged.  The heart remains enlarged.  Persistent vascular congestion, areas of atelectasis and probable left effusion.  IMPRESSION:  1.  Stable support apparatus. 2.  Persistent vascular congestion, areas of atelectasis and probable left effusion.   Original Report Authenticated By: Rudie Meyer, M.D.     Medications:     Scheduled Medications: . acetaminophen  1,000 mg Oral Q6H   Or  . acetaminophen (TYLENOL) oral liquid 160 mg/5 mL  975 mg Per Tube Q6H  . amLODipine  10 mg Oral Daily  . aspirin EC  325 mg Oral Daily   Or  . aspirin  324 mg Per Tube Daily  . bisacodyl  10 mg Oral Daily   Or  . bisacodyl  10 mg Rectal Daily  . chlorhexidine  15 mL Mouth/Throat BID  . darbepoetin (ARANESP) injection - NON-DIALYSIS  60 mcg Subcutaneous Q Fri-1800  . docusate sodium  200 mg Oral Daily  . hydrALAZINE  25 mg Oral Q8H  . insulin aspart  2-6 Units Subcutaneous Q4H  . metoprolol tartrate  12.5 mg Oral BID   Or  . metoprolol tartrate  12.5 mg Per Tube BID  . pantoprazole sodium  40 mg Per Tube Daily  . sodium chloride  3 mL Intravenous Q12H     Infusions: . sodium chloride 20 mL/hr at 03/01/13 0411  . sodium chloride    . sodium chloride    . lactated ringers    . niCARDipine 2.5 mg/hr (03/01/13 1000)    PRN Medications: sodium chloride, sodium chloride, metoprolol, morphine injection, ondansetron (ZOFRAN) IV, oxyCODONE, sodium chloride   Assessment:  1. Acute on chronic diastolic chf with anasarca 2. Hypertensive crisis 3. Acute respiratory failure 4. Acute/chronic renal failure:  (baseline 2.5) Now on HD 5. DM 6. Large pericardial effusion - s/p window 5/21 7. Protein calorie malnutrition (albumin 2.3) 8. Urinary light chains      -Fat pad biopsy negative but inadequate sample.   Plan/Discussion:    Much improved.  Will need chronic dialysis.  Amlodipine increased and hydralazine added.  Will d/c nicardipine and add Nitrates given diastolic dysfunction and pulmonary hypertension  Charlton Haws 03/01/2013 10:35 AM

## 2013-03-01 NOTE — Progress Notes (Signed)
PULMONARY  / CRITICAL CARE MEDICINE  Name: Gail Chapman MRN: [id number] DOB: 04/17/1956    ADMISSION DATE:  02/19/2013 CONSULTATION DATE:  02/26/13  REFERRING MD :  Dr. Shirlee Latch PRIMARY SERVICE: Cardiology  CHIEF COMPLAINT:  Altered Mental Status  BRIEF PATIENT DESCRIPTION:  57 y/o F with PMH of poorly controlled HTN, DM,  Diastolic CHF, initially admitted to [facility] with a 2-3 month hx of progressive weight gain & dyspnea, diuresed and discharged to Rehab.  Worsened at Rehab and was transferred to [facility] 5/14 for further care.  5/21 had progressive lethargy in the setting of worsened renal failure / antianxiety meds.  Questionable dx of amyloidosis.  PCCM consulted for AMS / Acute Respiratory Failure.    SIGNIFICANT EVENTS / STUDIES:  5/14 - Admit to [facility] with progressive renal fx, worsening swelling / wt gain, dyspnea 5/15 - Ultrafiltration initiated, continued until 5/19 5/20 - RHC >>Marked elevation of RV/LV pressures, Pulmonary HTN, Nml Cardiac output  LINES / TUBES: OETT 5/21>>>5-23 L IJ Tunneled HD catheter 5/21>>> Mediastinal chest tube 5/21>>>5/23 L Radial a-line 5/21>>> L [place] cvl>> CULTURES: 5/14 MRSA PCR>>>neg  ANTIBIOTICS: none  SUBJECTIVE: Arousable and interactive. VITAL SIGNS: Temp:  [97.2 F (36.2 C)-98.8 F (37.1 C)] 98.2 F (36.8 C) (05/24 0900) Pulse Rate:  [54-71] 69 (05/24 0900) Resp:  [5-29] 18 (05/24 0900) BP: (146-192)/(56-121) 157/65 mmHg (05/24 0900) SpO2:  [94 %-100 %] 100 % (05/24 0900) Arterial Line BP: (162-191)/(52-67) 169/53 mmHg (05/24 0900) FiO2 (%):  [40 %] 40 % (05/23 1930) Weight:  [82.8 kg (182 lb 8.7 oz)-84 kg (185 lb 3 oz)] 82.8 kg (182 lb 8.7 oz) (05/24 0700)  HEMODYNAMICS:    VENTILATOR SETTINGS: Vent Mode:  [-] CPAP;PSV FiO2 (%):  [40 %] 40 % Set Rate:  [14 bmp] 14 bmp Vt Set:  [400 mL] 400 mL PEEP:  [5 cmH20] 5 cmH20 Pressure Support:  [5 cmH20] 5 cmH20 Plateau Pressure:  [16 cmH20-22 cmH20] 16 cmH20  INTAKE /  OUTPUT: Intake/Output     05/23 0701 - 05/24 0700 05/24 0701 - 05/25 0700   P.O. 60 200   I.V. (mL/kg) 1328.3 (16) 90 (1.1)   NG/GT 30    IV Piggyback     Total Intake(mL/kg) 1418.3 (17.1) 290 (3.5)   Urine (mL/kg/hr) 419 (0.2) 40 (0.2)   Emesis/NG output 150 (0.1)    Other 3000 (1.5)    Chest Tube     Total Output 3569 40   Net -2150.8 +250        Emesis Occurrence 1 x      PHYSICAL EXAMINATION: General: chronically ill adult female. Neuro:Intact HEENT:  Mm pink/moist Cardiovascular:  s1s2 rrr, no m/r/g Lungs: diminished in bases Abdomen:  Round/soft, bsx4 active. Musculoskeletal:  No acute deformities Skin:  Warm/dry  LABS:  Recent Labs Lab 02/23/13 1935 02/24/13 0538  02/26/13 1300  02/26/13 1745  02/27/13 0340 02/27/13 0348 02/28/13 0400 02/28/13 1842 03/01/13 0400  HGB 9.1* 8.6*  < >  --   < >  --   < >  --  8.9* 9.0*  --  9.0*  WBC 9.1 7.8  < >  --   < >  --   --   --  12.2* 12.1*  --  15.1*  PLT 249 223  < >  --   < >  --   --   --  216 211  --  262  NA 135 138  < > 137  < >  --   < >  --  137 135  --  138  K 4.7 4.8  < > 4.8  < >  --   < >  --  4.8 4.0  --  3.3*  CL 100 103  < > 100  --   --   --   --  101 99  --  102  CO2 27 26  < > 25  --   --   --   --  26 23  --  24  GLUCOSE 168* 180*  < > 130*  --   --   < >  --  118* 130*  --  122*  BUN 47* 51*  < > 64*  --   --   --   --  67* 36*  --  23  CREATININE 3.26* 3.38*  < > 3.28*  --   --   --   --  3.49* 2.25*  --  1.72*  CALCIUM 8.8 8.7  < > 8.9  --   --   --   --  8.5 8.6  --  8.5  MG  --   --   --   --   --   --   --   --   --  2.0  --  2.0  PHOS  --   --   --   --   --   --   --   --   --  5.2*  --  4.3  AST  --   --   --  19  --   --   --   --   --   --   --   --   ALT  --   --   --  19  --   --   --   --   --   --   --   --   ALKPHOS  --   --   --  115  --   --   --   --   --   --   --   --   BILITOT  --   --   --  0.3  --   --   --   --   --   --   --   --   PROT  --   --   --  6.4  --   --    --   --   --   --   --   --   ALBUMIN  --   --   --  2.3*  --   --   --   --   --   --   --   --   APTT  --  55*  --   --   --   --   --   --   --   --   --   --   INR  --  1.07  --   --   --   --   --   --   --   --   --   --   PHART  --   --   < >  --   < > 7.475*  --  7.391  --   --  7.487*  --   PCO2ART  --   --   < >  --   < > 37.4  --  42.6  --   --  39.0  --   PO2ART  --   --   < >  --   < > 415.0*  --  92.1  --   --  99.0  --   < > = values in this interval not displayed.  Recent Labs Lab 02/28/13 1533 02/28/13 1923 02/28/13 2340 03/01/13 0329 03/01/13 0718  GLUCAP 94 109* 108* 110* 105*   Imaging: Dg Chest Port 1 View  03/01/2013   *RADIOLOGY REPORT*  Clinical Data: Respiratory failure, extubation  PORTABLE CHEST - 1 VIEW  Comparison: 02/28/2013; 02/27/2013; 02/26/2013  Findings: Grossly unchanged enlarged cardiac silhouette and mediastinal contours.  Interval extubation and removal of left- sided chest tube and enteric tube.  Stable positioning of left jugular approach dialysis catheter and left subclavian approach central venous catheter.  Mild cephalization of flow without frank evidence of edema.  Improved aeration of the bilateral lung bases with persistent left basilar/retrocardiac opacities.  No definite pleural effusion or pneumothorax.  Unchanged bones.  IMPRESSION: 1.  Interval extubation and removal support apparatus as above.  No pneumothorax. 2.  Pulmonary venous congestion without frank evidence of edema. 3.  Overall improved aeration of the lungs with persistent left basilar/retrocardiac opacities, atelectasis versus infiltrate.   Original Report Authenticated By: Tacey Ruiz, MD   Dg Chest Port 1 View  02/28/2013   *RADIOLOGY REPORT*  Clinical Data: Endotracheal tube position.  PORTABLE CHEST - 1 VIEW  Comparison: 02/27/2013.  Findings: The support apparatus is stable.  Pericardial drainage catheter is unchanged.  The heart remains enlarged.  Persistent vascular  congestion, areas of atelectasis and probable left effusion.  IMPRESSION:  1.  Stable support apparatus. 2.  Persistent vascular congestion, areas of atelectasis and probable left effusion.   Original Report Authenticated By: Rudie Meyer, M.D.     ASSESSMENT / PLAN:  PULMONARY A: Acute Respiratory Failure - in setting of progressive renal failure OSA - on bipap qhs  at baseline  PAH - noted on RHC, 96/41 (59)  P:   - extubated 5-23 - Will need bipap QHS as at home  CARDIOVASCULAR A:  Hypertensive Crisis CHF - with acute decompensation  P:  - CHF mgmt per Advanced Heart Failure Team. -Per cards  RENAL A:   Acute Renal Failure - no hydronephrosis on renal US 5/19 Hyponatremia   P:   - Per renal - F/U bmp per renal  GASTROINTESTINAL A:   SUP   P:   - Protonix for DVT proph. - renal diet  HEMATOLOGIC A:   Anemia - in setting of chronic illness. No evidence of acute bleeding.   P:  - Monitor H/H - Consider tx if hgb <7%  INFECTIOUS A:   No evidence of acute infectious process.   P:   - Monitor fever curve/wbc.  ENDOCRINE A:   DM   P:   - ICU SSI.  NEUROLOGIC A:   Acute Encephalopathy - in setting of renal failure; resolved.  P:    Resolved 5-24 - Precedex, dc'd 5-23   Global:  Leave in ICU 5/24.  Bipap ordered    Dorcas Carrow Beeper  [phone]  Cell  [phone]  If no response or cell goes to voicemail, call beeper [phone]   03/01/2013, 9:58 AM

## 2013-03-01 NOTE — Progress Notes (Signed)
Pt. Is unable to tolerate CPAP at this time. She is very nauseous and is vomiting. RN is aware to watch patient Sats and to contact is anything changes. We will try CPAP if Pt can tolerate at later time.

## 2013-03-01 NOTE — Progress Notes (Signed)
3 Days Post-Op Procedure(s) (LRB): SUBXYPHOID PERICARDIAL WINDOW (N/A) Subjective: No complaints  Objective: Vital signs in last 24 hours: Temp:  [97.2 F (36.2 C)-98.6 F (37 C)] 97.2 F (36.2 C) (05/24 1200) Pulse Rate:  [54-71] 70 (05/24 1200) Cardiac Rhythm:  [-] Normal sinus rhythm (05/24 1200) Resp:  [5-29] 12 (05/24 1200) BP: (146-192)/(56-121) 174/70 mmHg (05/24 1200) SpO2:  [94 %-100 %] 100 % (05/24 1200) Arterial Line BP: (163-207)/(53-67) 207/66 mmHg (05/24 1200) FiO2 (%):  [40 %] 40 % (05/23 1930) Weight:  [82.8 kg (182 lb 8.7 oz)-84 kg (185 lb 3 oz)] 82.8 kg (182 lb 8.7 oz) (05/24 0700)  Hemodynamic parameters for last 24 hours:    Intake/Output from previous day: 05/23 0701 - 05/24 0700 In: 1418.3 [P.O.:60; I.V.:1328.3; NG/GT:30] Out: 3569 [Urine:419; Emesis/NG output:150] Intake/Output this shift: Total I/O In: 400 [P.O.:200; I.V.:200] Out: 110 [Urine:110]  Heart: regular rate and rhythm, S1, S2 normal, no murmur, click, rub or gallop Lungs: clear to auscultation bilaterally Wound: subxyphoid incision ok  Lab Results:  Recent Labs  02/28/13 0400 03/01/13 0400  WBC 12.1* 15.1*  HGB 9.0* 9.0*  HCT 27.9* 27.3*  PLT 211 262   BMET:  Recent Labs  02/28/13 0400 03/01/13 0400  NA 135 138  K 4.0 3.3*  CL 99 102  CO2 23 24  GLUCOSE 130* 122*  BUN 36* 23  CREATININE 2.25* 1.72*  CALCIUM 8.6 8.5    PT/INR: No results found for this basename: LABPROT, INR,  in the last 72 hours ABG    Component Value Date/Time   PHART 7.487* 02/28/2013 1842   HCO3 29.6* 02/28/2013 1842   TCO2 31 02/28/2013 1842   ACIDBASEDEF 1.0 02/25/2013 0937   O2SAT 98.0 02/28/2013 1842   CBG (last 3)   Recent Labs  03/01/13 0329 03/01/13 0718 03/01/13 1141  GLUCAP 110* 105* 101*    Assessment/Plan: S/P Procedure(s) (LRB): SUBXYPHOID PERICARDIAL WINDOW (N/A) Cytology of pericardial fluid negative.  Plans per cardiology.   LOS: 10 days    Gail Chapman  K 03/01/2013

## 2013-03-01 NOTE — Progress Notes (Signed)
Patient ID: Gail Chapman, female   DOB: 1956-04-07, 57 y.o.   MRN: [id number]   [facility] Progress Note    Subjective:   Nauseated this morning-extubated yesterday evening. No acute issues overnight.    Objective:   BP 154/62  Pulse 70  Temp(Src) 98.1 F (36.7 C) (Other (Comment))  Resp 14  Ht 5' 1.5" (1.562 m)  Wt 82.8 kg (182 lb 8.7 oz)  BMI 33.94 kg/m2  SpO2 100%  Intake/Output Summary (Last 24 hours) at 03/01/13 4098 Last data filed at 03/01/13 0800  Gross per 24 hour  Intake 1411.65 ml  Output   3589 ml  Net -2177.35 ml   Weight change: -7.6 kg (-16 lb 12.1 oz)  Physical Exam: Gen: Comfortably resting in bed, awakened and engages in conversation CVS: Pulse regular in rate and rhythm, S1 and S2 normal Resp: Anteriorly clear to auscultation-no rales/rhonchi Abd: Soft, flat, nontender and bowel sounds are normal Ext: 2+ upper extremity edema, 2+ lower extremity edema  Imaging: Dg Chest Port 1 View  03/01/2013   *RADIOLOGY REPORT*  Clinical Data: Respiratory failure, extubation  PORTABLE CHEST - 1 VIEW  Comparison: 02/28/2013; 02/27/2013; 02/26/2013  Findings: Grossly unchanged enlarged cardiac silhouette and mediastinal contours.  Interval extubation and removal of left- sided chest tube and enteric tube.  Stable positioning of left jugular approach dialysis catheter and left subclavian approach central venous catheter.  Mild cephalization of flow without frank evidence of edema.  Improved aeration of the bilateral lung bases with persistent left basilar/retrocardiac opacities.  No definite pleural effusion or pneumothorax.  Unchanged bones.  IMPRESSION: 1.  Interval extubation and removal support apparatus as above.  No pneumothorax. 2.  Pulmonary venous congestion without frank evidence of edema. 3.  Overall improved aeration of the lungs with persistent left basilar/retrocardiac opacities, atelectasis versus infiltrate.   Original Report Authenticated By:  Tacey Ruiz, MD   Dg Chest Port 1 View  02/28/2013   *RADIOLOGY REPORT*  Clinical Data: Endotracheal tube position.  PORTABLE CHEST - 1 VIEW  Comparison: 02/27/2013.  Findings: The support apparatus is stable.  Pericardial drainage catheter is unchanged.  The heart remains enlarged.  Persistent vascular congestion, areas of atelectasis and probable left effusion.  IMPRESSION:  1.  Stable support apparatus. 2.  Persistent vascular congestion, areas of atelectasis and probable left effusion.   Original Report Authenticated By: Rudie Meyer, M.D.    Labs: BMET  Recent Labs Lab 02/24/13 1191 02/25/13 0453 02/25/13 1200 02/26/13 0415 02/26/13 1300 02/26/13 1612 02/26/13 1750 02/27/13 0348 02/28/13 0400 03/01/13 0400  NA 138 138  --  134* 137 137 137 137 135 138  K 4.8 4.8  --  4.9 4.8 4.4 4.4 4.8 4.0 3.3*  CL 103 103  --  98 100  --   --  101 99 102  CO2 26 25  --  26 25  --   --  26 23 24   GLUCOSE 180* 115*  --  140* 130*  --  115* 118* 130* 122*  BUN 51* 55*  --  63* 64*  --   --  67* 36* 23  CREATININE 3.38* 3.31* 3.32* 3.15* 3.28*  --   --  3.49* 2.25* 1.72*  CALCIUM 8.7 8.6  --  8.8 8.9  --   --  8.5 8.6 8.5  PHOS  --   --   --   --   --   --   --   --  5.2* 4.3   CBC  Recent Labs Lab 02/26/13 1700 02/26/13 1750 02/27/13 0348 02/28/13 0400 03/01/13 0400  WBC 6.9  --  12.2* 12.1* 15.1*  HGB 8.5* 9.2* 8.9* 9.0* 9.0*  HCT 26.7* 27.0* 27.8* 27.9* 27.3*  MCV 82.9  --  83.2 84.0 84.0  PLT 241  --  216 211 262    Medications:    . acetaminophen  1,000 mg Oral Q6H   Or  . acetaminophen (TYLENOL) oral liquid 160 mg/5 mL  975 mg Per Tube Q6H  . amLODipine  10 mg Oral Daily  . aspirin EC  325 mg Oral Daily   Or  . aspirin  324 mg Per Tube Daily  . bisacodyl  10 mg Oral Daily   Or  . bisacodyl  10 mg Rectal Daily  . chlorhexidine  15 mL Mouth/Throat BID  . darbepoetin (ARANESP) injection - NON-DIALYSIS  60 mcg Subcutaneous Q Fri-1800  . docusate sodium  200 mg Oral  Daily  . hydrALAZINE  25 mg Oral Q8H  . insulin aspart  2-6 Units Subcutaneous Q4H  . metoprolol tartrate  12.5 mg Oral BID   Or  . metoprolol tartrate  12.5 mg Per Tube BID  . pantoprazole sodium  40 mg Per Tube Daily  . sodium chloride  3 mL Intravenous Q12H     Assessment/ Plan:   1. Acute renal failure chronic kidney disease: Suspect prominent cardiorenal component to this-unfortunately has failed isolated ultrafiltration and trials of diuretics. Plan for dialysis today primarily for volume management. Will continue to monitor daily for emergent needs-May possibly recover after back on "[place] curve"-cardiology feel otherwise. 2. Diastolic dysfunction/congestive heart failure: Volume removal with dialysis given limitations of diuretics/ultrafiltration so far. 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  3. Anemia: Given intravenous iron, currently on Aranesp. 4. Ventilator dependent respiratory failure: Extubated yesterday-on oxygen via nasal cannula. 5. Hypertension: Attempted transition over to oral therapy-limited by her nausea. Remains on nicardipine drip.   Zetta Bills, MD 03/01/2013, 8:19 AM

## 2013-03-02 LAB — TYPE AND SCREEN
ABO/RH(D): A POS
Antibody Screen: NEGATIVE
Unit division: 0
Unit division: 0

## 2013-03-02 LAB — GLUCOSE, CAPILLARY
Glucose-Capillary: 101 mg/dL — ABNORMAL HIGH (ref 70–99)
Glucose-Capillary: 114 mg/dL — ABNORMAL HIGH (ref 70–99)
Glucose-Capillary: 149 mg/dL — ABNORMAL HIGH (ref 70–99)
Glucose-Capillary: 97 mg/dL (ref 70–99)

## 2013-03-02 LAB — BASIC METABOLIC PANEL
Creatinine, Ser: 1.64 mg/dL — ABNORMAL HIGH (ref 0.50–1.10)
Glucose, Bld: 107 mg/dL — ABNORMAL HIGH (ref 70–99)
Potassium: 3.9 mEq/L (ref 3.5–5.1)
Sodium: 135 mEq/L (ref 135–145)

## 2013-03-02 LAB — BODY FLUID CULTURE: Gram Stain: NONE SEEN

## 2013-03-02 MED ORDER — HYDRALAZINE HCL 50 MG PO TABS
50.0000 mg | ORAL_TABLET | Freq: Three times a day (TID) | ORAL | Status: DC
  Administered 2013-03-02 – 2013-03-03 (×3): 50 mg via ORAL
  Filled 2013-03-02 (×6): qty 1

## 2013-03-02 MED ORDER — METOCLOPRAMIDE HCL 5 MG PO TABS
5.0000 mg | ORAL_TABLET | Freq: Three times a day (TID) | ORAL | Status: DC
  Administered 2013-03-02 – 2013-03-06 (×16): 5 mg via ORAL
  Filled 2013-03-02 (×21): qty 1

## 2013-03-02 NOTE — Progress Notes (Signed)
Patient ID: Gail Chapman, female   DOB: August 30, 1956, 57 y.o.   MRN: [id number]   [facility] Progress Note    Subjective:   Reports to be feeling well, tolerated hemodialysis without problems yesterday. No nausea.    Objective:   BP 157/70  Pulse 66  Temp(Src) 98.5 F (36.9 C) (Oral)  Resp 12  Ht 5' 1.5" (1.562 m)  Wt 77.1 kg (169 lb 15.6 oz)  BMI 31.6 kg/m2  SpO2 100%  Intake/Output Summary (Last 24 hours) at 03/02/13 7846 Last data filed at 03/02/13 0600  Gross per 24 hour  Intake    950 ml  Output   4415 ml  Net  -3465 ml   Weight change: -0.9 kg (-1 lb 15.7 oz)  Physical Exam: Gen: Comfortably sitting up in recliner, watching television CVS: Pulse regular in rate and rhythm, S1 and S2 normal Resp: Decreased breath sounds bibasally-poor inspiratory effort Abd: Soft, obese, nontender and bowel sounds are normal Ext: 2-3+ lower extremity edema  Imaging: Dg Chest Port 1 View  03/01/2013   *RADIOLOGY REPORT*  Clinical Data: Respiratory failure, extubation  PORTABLE CHEST - 1 VIEW  Comparison: 02/28/2013; 02/27/2013; 02/26/2013  Findings: Grossly unchanged enlarged cardiac silhouette and mediastinal contours.  Interval extubation and removal of left- sided chest tube and enteric tube.  Stable positioning of left jugular approach dialysis catheter and left subclavian approach central venous catheter.  Mild cephalization of flow without frank evidence of edema.  Improved aeration of the bilateral lung bases with persistent left basilar/retrocardiac opacities.  No definite pleural effusion or pneumothorax.  Unchanged bones.  IMPRESSION: 1.  Interval extubation and removal support apparatus as above.  No pneumothorax. 2.  Pulmonary venous congestion without frank evidence of edema. 3.  Overall improved aeration of the lungs with persistent left basilar/retrocardiac opacities, atelectasis versus infiltrate.   Original Report Authenticated By: Tacey Ruiz, MD     Labs: BMET  Recent Labs Lab 02/24/13 9629 02/25/13 0453 02/25/13 1200 02/26/13 0415 02/26/13 1300 02/26/13 1612 02/26/13 1750 02/27/13 0348 02/28/13 0400 03/01/13 0400  NA 138 138  --  134* 137 137 137 137 135 138  K 4.8 4.8  --  4.9 4.8 4.4 4.4 4.8 4.0 3.3*  CL 103 103  --  98 100  --   --  101 99 102  CO2 26 25  --  26 25  --   --  26 23 24   GLUCOSE 180* 115*  --  140* 130*  --  115* 118* 130* 122*  BUN 51* 55*  --  63* 64*  --   --  67* 36* 23  CREATININE 3.38* 3.31* 3.32* 3.15* 3.28*  --   --  3.49* 2.25* 1.72*  CALCIUM 8.7 8.6  --  8.8 8.9  --   --  8.5 8.6 8.5  PHOS  --   --   --   --   --   --   --   --  5.2* 4.3   CBC  Recent Labs Lab 02/26/13 1700 02/26/13 1750 02/27/13 0348 02/28/13 0400 03/01/13 0400  WBC 6.9  --  12.2* 12.1* 15.1*  HGB 8.5* 9.2* 8.9* 9.0* 9.0*  HCT 26.7* 27.0* 27.8* 27.9* 27.3*  MCV 82.9  --  83.2 84.0 84.0  PLT 241  --  216 211 262    Medications:    . acetaminophen  1,000 mg Oral Q6H   Or  . acetaminophen (TYLENOL) oral liquid 160 mg/5  mL  975 mg Per Tube Q6H  . amLODipine  10 mg Oral Daily  . aspirin EC  325 mg Oral Daily   Or  . aspirin  324 mg Per Tube Daily  . bisacodyl  10 mg Oral Daily   Or  . bisacodyl  10 mg Rectal Daily  . darbepoetin (ARANESP) injection - NON-DIALYSIS  60 mcg Subcutaneous Q Fri-1800  . docusate sodium  200 mg Oral Daily  . hydrALAZINE  25 mg Oral Q8H  . insulin aspart  2-6 Units Subcutaneous Q4H  . isosorbide dinitrate  10 mg Oral TID  . metoprolol tartrate  12.5 mg Oral BID   Or  . metoprolol tartrate  12.5 mg Per Tube BID  . pantoprazole sodium  40 mg Per Tube Daily  . sodium chloride  3 mL Intravenous Q12H     Assessment/ Plan:   1. Acute renal failure chronic kidney disease: Suspect prominent cardiorenal component to this-unfortunately has failed isolated ultrafiltration and trials of diuretics. Plan for hemodialysis tomorrow-we'll continue to assess for possible renal recovery or  need for chronic dialysis dependency. Oliguric urine output yesterday (315 cc) 2. Diastolic dysfunction/congestive heart failure: Continue supportive therapy with dialysis for volume management-still remains significantly volume overloaded and has a long way to go before euvolemic. 3. Anemia: Given intravenous iron, currently on Aranesp.  4. nausea/vomiting/history of gastroparesis: We'll start Reglan 3 times a day a.c./each bedtime 5. Hypertension: Attempted transition over to oral therapy-limited by her nausea. Remains on nicardipine drip.   Zetta Bills, MD 03/02/2013, 7:52 AM

## 2013-03-02 NOTE — Progress Notes (Signed)
4 Days Post-Op Procedure(s) (LRB): SUBXYPHOID PERICARDIAL WINDOW (N/A) Subjective: No complaints  Objective: Vital signs in last 24 hours: Temp:  [97.2 F (36.2 C)-99.1 F (37.3 C)] 97.6 F (36.4 C) (05/25 1203) Pulse Rate:  [61-78] 61 (05/25 1200) Cardiac Rhythm:  [-] Normal sinus rhythm (05/25 1200) Resp:  [1-22] 19 (05/25 1200) BP: (157-205)/(69-89) 167/76 mmHg (05/25 1200) SpO2:  [91 %-100 %] 100 % (05/25 1200) Arterial Line BP: (198-222)/(65-71) 198/69 mmHg (05/24 2000) FiO2 (%):  [40 %] 40 % (05/25 0400) Weight:  [77.1 kg (169 lb 15.6 oz)-83.1 kg (183 lb 3.2 oz)] 77.1 kg (169 lb 15.6 oz) (05/25 0500)  Hemodynamic parameters for last 24 hours:    Intake/Output from previous day: 05/24 0701 - 05/25 0700 In: 970 [P.O.:450; I.V.:520] Out: 4415 [Urine:315] Intake/Output this shift: Total I/O In: 600 [P.O.:500; I.V.:100] Out: -   Wound: incision ok  Lab Results:  Recent Labs  02/28/13 0400 03/01/13 0400  WBC 12.1* 15.1*  HGB 9.0* 9.0*  HCT 27.9* 27.3*  PLT 211 262   BMET:  Recent Labs  03/01/13 0400 03/02/13 0800  NA 138 135  K 3.3* 3.9  CL 102 99  CO2 24 27  GLUCOSE 122* 107*  BUN 23 15  CREATININE 1.72* 1.64*  CALCIUM 8.5 8.5    PT/INR: No results found for this basename: LABPROT, INR,  in the last 72 hours ABG    Component Value Date/Time   PHART 7.487* 02/28/2013 1842   HCO3 29.6* 02/28/2013 1842   TCO2 31 02/28/2013 1842   ACIDBASEDEF 1.0 02/25/2013 0937   O2SAT 98.0 02/28/2013 1842   CBG (last 3)   Recent Labs  03/02/13 0345 03/02/13 0735 03/02/13 1205  GLUCAP 105* 97 114*    Assessment/Plan: S/P Procedure(s) (LRB): SUBXYPHOID PERICARDIAL WINDOW (N/A) Stable. Incision looks good. Myocardial bx pending Plans per primary service.   LOS: 11 days    Shatoya Roets K 03/02/2013

## 2013-03-02 NOTE — Progress Notes (Signed)
Patient ID: Gail Chapman, female   DOB: [birth year], 57 y.o.   MRN: [id number]    Subjective:    57 y/o female with a h/o severe HTN, DM, diastolic HF and PAH. Admitted 5/14 with HTN crisis (BP 260/120) and massive volume overload. Failed high dose lasix and ultrafiltration. RHC 5/20 with markedly elevated biventricular pressures c/w restrictive physiology.   On 5/21 underwent pericaridal window for large effusion and myocardial biopsy (exclude amyloid). Also intubated for respiratory failure and perm cath placed for initiation of HD.  Doing well with HD. To have it again in am  Result of myocardial biopsy not back yet. (urine with light chains, fat pad bx negative).   Resting comfortably in chair  Objective:    Vital Signs:   Temp:  [97.2 F (36.2 C)-99.1 F (37.3 C)] 98.5 F (36.9 C) (05/25 0700) Pulse Rate:  [63-78] 67 (05/25 1000) Resp:  [1-22] 14 (05/25 1000) BP: (152-205)/(57-89) 192/77 mmHg (05/25 1000) SpO2:  [91 %-100 %] 100 % (05/25 1000) Arterial Line BP: (187-222)/(59-71) 198/69 mmHg (05/24 2000) FiO2 (%):  [40 %] 40 % (05/25 0400) Weight:  [169 lb 15.6 oz (77.1 kg)-183 lb 3.2 oz (83.1 kg)] 169 lb 15.6 oz (77.1 kg) (05/25 0500)    Weight change: Filed Weights   03/01/13 1405 03/01/13 1754 03/02/13 0500  Weight: 183 lb 3.2 oz (83.1 kg) 172 lb 13.5 oz (78.4 kg) 169 lb 15.6 oz (77.1 kg)    Intake/Output:   Intake/Output Summary (Last 24 hours) at 03/02/13 1056 Last data filed at 03/02/13 1000  Gross per 24 hour  Intake   1095 ml  Output   4335 ml  Net  -3240 ml     Physical Exam:  General: Alert chronically ill black female Neuro: Normal and non focal.  HEENT:  L chest perm cath L subclavian CVL Neck: Supple without bruits. JVP jaw. Carotids 2+ bilaterally without bruits  Lungs: Resp regular and unlabored, clear anteriorly Heart: RRR no s3, + s4, 2/6 TR  And SEM Abdomen: mildly distended. nontender, BS + x 4.  Extremities: No clubbing, cyanosis. 1+ bilat  LEE up into thighs   Telemetry: SR 60-70s  Labs: Basic Metabolic Panel:  Recent Labs Lab 02/26/13 1300  02/26/13 1750 02/27/13 0348 02/28/13 0400 03/01/13 0400 03/02/13 0800  NA 137  < > 137 137 135 138 135  K 4.8  < > 4.4 4.8 4.0 3.3* 3.9  CL 100  --   --  101 99 102 99  CO2 25  --   --  26 23 24 27   GLUCOSE 130*  --  115* 118* 130* 122* 107*  BUN 64*  --   --  67* 36* 23 15  CREATININE 3.28*  --   --  3.49* 2.25* 1.72* 1.64*  CALCIUM 8.9  --   --  8.5 8.6 8.5 8.5  MG  --   --   --   --  2.0 2.0  --   PHOS  --   --   --   --  5.2* 4.3  --   < > = values in this interval not displayed.  Liver Function Tests:  Recent Labs Lab 02/26/13 1300  AST 19  ALT 19  ALKPHOS 115  BILITOT 0.3  PROT 6.4  ALBUMIN 2.3*   No results found for this basename: LIPASE, AMYLASE,  in the last 168 hours  Recent Labs Lab 02/26/13 0415  AMMONIA 23    CBC:  Recent Labs Lab 02/25/13 1200  02/26/13 1700 02/26/13 1750 02/27/13 0348 02/28/13 0400 03/01/13 0400  WBC 7.5  --  6.9  --  12.2* 12.1* 15.1*  HGB 8.7*  < > 8.5* 9.2* 8.9* 9.0* 9.0*  HCT 26.9*  < > 26.7* 27.0* 27.8* 27.9* 27.3*  MCV 84.9  --  82.9  --  83.2 84.0 84.0  PLT 228  --  241  --  216 211 262  < > = values in this interval not displayed.  Cardiac Enzymes: No results found for this basename: CKTOTAL, CKMB, CKMBINDEX, TROPONINI,  in the last 168 hours  BNP: BNP (last 3 results)  Recent Labs  02/19/13 1429 02/20/13 0500  PROBNP 45515.0* 57495.0*     Other results:   Imaging: Dg Chest Port 1 View  03/01/2013   *RADIOLOGY REPORT*  Clinical Data: Respiratory failure, extubation  PORTABLE CHEST - 1 VIEW  Comparison: 02/28/2013; 02/27/2013; 02/26/2013  Findings: Grossly unchanged enlarged cardiac silhouette and mediastinal contours.  Interval extubation and removal of left- sided chest tube and enteric tube.  Stable positioning of left jugular approach dialysis catheter and left subclavian approach central  venous catheter.  Mild cephalization of flow without frank evidence of edema.  Improved aeration of the bilateral lung bases with persistent left basilar/retrocardiac opacities.  No definite pleural effusion or pneumothorax.  Unchanged bones.  IMPRESSION: 1.  Interval extubation and removal support apparatus as above.  No pneumothorax. 2.  Pulmonary venous congestion without frank evidence of edema. 3.  Overall improved aeration of the lungs with persistent left basilar/retrocardiac opacities, atelectasis versus infiltrate.   Original Report Authenticated By: Tacey Ruiz, MD     Medications:     Scheduled Medications: . acetaminophen  1,000 mg Oral Q6H   Or  . acetaminophen (TYLENOL) oral liquid 160 mg/5 mL  975 mg Per Tube Q6H  . amLODipine  10 mg Oral Daily  . aspirin EC  325 mg Oral Daily   Or  . aspirin  324 mg Per Tube Daily  . bisacodyl  10 mg Oral Daily   Or  . bisacodyl  10 mg Rectal Daily  . darbepoetin (ARANESP) injection - NON-DIALYSIS  60 mcg Subcutaneous Q Fri-1800  . docusate sodium  200 mg Oral Daily  . hydrALAZINE  25 mg Oral Q8H  . insulin aspart  2-6 Units Subcutaneous Q4H  . isosorbide dinitrate  10 mg Oral TID  . metoCLOPramide  5 mg Oral TID AC & HS  . metoprolol tartrate  12.5 mg Oral BID   Or  . metoprolol tartrate  12.5 mg Per Tube BID  . pantoprazole sodium  40 mg Per Tube Daily  . sodium chloride  3 mL Intravenous Q12H    Infusions: . sodium chloride 20 mL/hr at 03/02/13 0610  . sodium chloride    . sodium chloride    . lactated ringers      PRN Medications: metoprolol, morphine injection, ondansetron (ZOFRAN) IV, oxyCODONE, sodium chloride   Assessment:   1. Acute on chronic diastolic chf with anasarca 2. Hypertensive crisis 3. Acute respiratory failure 4. Acute/chronic renal failure:  (baseline 2.5) Now on HD 5. DM 6. Large pericardial effusion - s/p window 5/21 7. Protein calorie malnutrition (albumin 2.3) 8. Urinary light chains       -Fat pad biopsy negative but inadequate sample.   Plan/Discussion:    Much improved.  Will need chronic dialysis.  Amlodipine increased and hydralazine added.  Will d/c nicardipine and  add Nitrates given diastolic dysfunction and pulmonary hypertension  Taking PO now despite gastroparesis.  Increase hydralazine to 50mg  q8  Charlton Haws 03/02/2013 10:56 AM

## 2013-03-03 LAB — RENAL FUNCTION PANEL
Albumin: 1.9 g/dL — ABNORMAL LOW (ref 3.5–5.2)
BUN: 23 mg/dL (ref 6–23)
Chloride: 99 mEq/L (ref 96–112)
GFR calc non Af Amer: 25 mL/min — ABNORMAL LOW (ref >90)
Phosphorus: 3.3 mg/dL (ref 2.3–4.6)
Potassium: 4.1 mEq/L (ref 3.5–5.1)

## 2013-03-03 LAB — CBC
MCH: 27.3 pg (ref 26.0–34.0)
MCV: 87.5 fL (ref 78.0–100.0)
Platelets: 283 10*3/uL (ref 150–400)
RBC: 3.19 MIL/uL — ABNORMAL LOW (ref 3.87–5.11)
RDW: 17.9 % — ABNORMAL HIGH (ref 11.5–15.5)
WBC: 11.4 10*3/uL — ABNORMAL HIGH (ref 4.0–10.5)

## 2013-03-03 LAB — GLUCOSE, CAPILLARY
Glucose-Capillary: 144 mg/dL — ABNORMAL HIGH (ref 70–99)
Glucose-Capillary: 102 mg/dL — ABNORMAL HIGH (ref 70–99)
Glucose-Capillary: 108 mg/dL — ABNORMAL HIGH (ref 70–99)
Glucose-Capillary: 176 mg/dL — ABNORMAL HIGH (ref 70–99)

## 2013-03-03 MED ORDER — CARVEDILOL 6.25 MG PO TABS
6.2500 mg | ORAL_TABLET | Freq: Two times a day (BID) | ORAL | Status: DC
  Administered 2013-03-03 – 2013-03-04 (×2): 6.25 mg via ORAL
  Filled 2013-03-03 (×5): qty 1

## 2013-03-03 MED ORDER — HYDRALAZINE HCL 50 MG PO TABS
75.0000 mg | ORAL_TABLET | Freq: Three times a day (TID) | ORAL | Status: DC
  Administered 2013-03-03 – 2013-03-04 (×3): 75 mg via ORAL
  Filled 2013-03-03 (×6): qty 1

## 2013-03-03 NOTE — Progress Notes (Signed)
[facility]  Subjective:  Awake, alert, sitting up in chair.  She says she required dialysis ~3 yr ago for 2 1/2 months but was able to get off HD.     Objective: Vital signs in last 24 hours: Blood pressure 170/67, pulse 64, temperature 97.9 F (36.6 C), temperature source Oral, resp. rate 12, height 5' 1.5" (1.562 m), weight 78.7 kg (173 lb 8 oz), SpO2 96.00%.    PHYSICAL EXAM General--as above Chest--no crackles anteriorly, HD tunneled cath L IJ, pericardial window site bandaged Heart--no rub Abd--nontender Extr--2+ pretib edema  240 cc urine out last 24 hr  Lab Results:   Recent Labs Lab 02/27/13 0348 02/28/13 0400 03/01/13 0400 03/02/13 0800  NA 137 135 138 135  K 4.8 4.0 3.3* 3.9  CL 101 99 102 99  CO2 26 23 24 27   BUN 67* 36* 23 15  CREATININE 3.49* 2.25* 1.72* 1.64*  GLUCOSE 118* 130* 122* 107*  CALCIUM 8.5 8.6 8.5 8.5  PHOS  --  5.2* 4.3  --      Recent Labs  03/01/13 0400  WBC 15.1*  HGB 9.0*  HCT 27.3*  PLT 262     I have reviewed the patient's current medications. Scheduled: . acetaminophen  1,000 mg Oral Q6H   Or  . acetaminophen (TYLENOL) oral liquid 160 mg/5 mL  975 mg Per Tube Q6H  . amLODipine  10 mg Oral Daily  . aspirin EC  325 mg Oral Daily   Or  . aspirin  324 mg Per Tube Daily  . bisacodyl  10 mg Oral Daily   Or  . bisacodyl  10 mg Rectal Daily  . darbepoetin (ARANESP) injection - NON-DIALYSIS  60 mcg Subcutaneous Q Fri-1800  . docusate sodium  200 mg Oral Daily  . hydrALAZINE  50 mg Oral Q8H  . insulin aspart  2-6 Units Subcutaneous Q4H  . isosorbide dinitrate  10 mg Oral TID  . metoCLOPramide  5 mg Oral TID AC & HS  . metoprolol tartrate  12.5 mg Oral BID   Or  . metoprolol tartrate  12.5 mg Per Tube BID  . pantoprazole sodium  40 mg Per Tube Daily  . sodium chloride  3 mL Intravenous Q12H     Assssment/Plan:   1. Acute renal failure chronic kidney disease: Weight has decreased from 96.6 kg on 14  May yo 78.7 kg today.  She still has significant pretib edema and high BP.  HD is planned for today with 4-5 L UF goal.  I suspect she will need HD in [place] and will arrange for outpatient HD unless renal fct improves 2. Diastolic dysfunction/congestive heart failure: Continue supportive therapy with dialysis for volume management-still remains significantly volume overloaded and has a long way to go before euvolemic.  3. Anemia: Given intravenous iron (1020 mg feraheme on 22 May), currently on Aranesp. HGB 9 on 24 May 4. nausea/vomiting/history of gastroparesis:   Reglan 5 mg po QID 5. Hypertension:  On po hydralazine, amlodipine, metoprolol, and isordil 6.  Pericardial window: done on 28 Feb 2013.  Per [facility]     LOS: 12 days   Matalyn Nawaz F 03/03/2013,7:25 AM   .labalb

## 2013-03-03 NOTE — Progress Notes (Signed)
Patient ID: Gail Chapman, female   DOB: 1956-10-09, 57 y.o.   MRN: [id number]    Subjective:    57 y/o female with a h/o severe HTN, DM, diastolic HF and PAH. Admitted 5/14 with HTN crisis (BP 260/120) and massive volume overload. Failed high dose lasix and ultrafiltration. RHC 5/20 with markedly elevated biventricular pressures c/w restrictive physiology.   On 5/21 underwent pericaridal window for large effusion and myocardial biopsy (exclude amyloid). Also intubated for respiratory failure and perm cath placed for initiation of HD.   She reports feeling much better with initiation of dialysis and pericardial window.  No dyspnea this morning.  Result of myocardial biopsy not back yet.  Fat pad biopsy negative but not really an adequate sample.   Resting comfortably in chair.  BP still high.  For HD today.   Objective:    Vital Signs:   Temp:  [97.6 F (36.4 C)-98.5 F (36.9 C)] 98.3 F (36.8 C) (05/26 0726) Pulse Rate:  [60-68] 63 (05/26 0700) Resp:  [0-21] 13 (05/26 0700) BP: (151-193)/(62-87) 169/71 mmHg (05/26 0700) SpO2:  [96 %-100 %] 100 % (05/26 0700) FiO2 (%):  [40 %] 40 % (05/26 0400) Weight:  [173 lb 8 oz (78.7 kg)] 173 lb 8 oz (78.7 kg) (05/26 0500)    Weight change: Filed Weights   03/01/13 1754 03/02/13 0500 03/03/13 0500  Weight: 172 lb 13.5 oz (78.4 kg) 169 lb 15.6 oz (77.1 kg) 173 lb 8 oz (78.7 kg)    Intake/Output:   Intake/Output Summary (Last 24 hours) at 03/03/13 0743 Last data filed at 03/03/13 0600  Gross per 24 hour  Intake   1220 ml  Output    240 ml  Net    980 ml     Physical Exam:  General: Alert chronically ill black female Neuro: Normal and non focal.  HEENT:  L chest perm cath L subclavian CVL Neck: Supple without bruits. JVP 12 cm. Carotids 2+ bilaterally without bruits  Lungs: Resp regular and unlabored, clear anteriorly Heart: RRR no s3, + s4, 2/6 TR  And SEM Abdomen: mildly distended. nontender, BS + x 4.  Extremities: No  clubbing, cyanosis. 1+ bilat LEE up to knees   Telemetry: SR 60-70s  Labs: Basic Metabolic Panel:  Recent Labs Lab 02/26/13 1300  02/26/13 1750 02/27/13 0348 02/28/13 0400 03/01/13 0400 03/02/13 0800  NA 137  < > 137 137 135 138 135  K 4.8  < > 4.4 4.8 4.0 3.3* 3.9  CL 100  --   --  101 99 102 99  CO2 25  --   --  26 23 24 27   GLUCOSE 130*  --  115* 118* 130* 122* 107*  BUN 64*  --   --  67* 36* 23 15  CREATININE 3.28*  --   --  3.49* 2.25* 1.72* 1.64*  CALCIUM 8.9  --   --  8.5 8.6 8.5 8.5  MG  --   --   --   --  2.0 2.0  --   PHOS  --   --   --   --  5.2* 4.3  --   < > = values in this interval not displayed.  Liver Function Tests:  Recent Labs Lab 02/26/13 1300  AST 19  ALT 19  ALKPHOS 115  BILITOT 0.3  PROT 6.4  ALBUMIN 2.3*   No results found for this basename: LIPASE, AMYLASE,  in the last 168 hours  Recent Labs  Lab 02/26/13 0415  AMMONIA 23    CBC:  Recent Labs Lab 02/25/13 1200  02/26/13 1700 02/26/13 1750 02/27/13 0348 02/28/13 0400 03/01/13 0400  WBC 7.5  --  6.9  --  12.2* 12.1* 15.1*  HGB 8.7*  < > 8.5* 9.2* 8.9* 9.0* 9.0*  HCT 26.9*  < > 26.7* 27.0* 27.8* 27.9* 27.3*  MCV 84.9  --  82.9  --  83.2 84.0 84.0  PLT 228  --  241  --  216 211 262  < > = values in this interval not displayed.  Cardiac Enzymes: No results found for this basename: CKTOTAL, CKMB, CKMBINDEX, TROPONINI,  in the last 168 hours  BNP: BNP (last 3 results)  Recent Labs  02/19/13 1429 02/20/13 0500  PROBNP 45515.0* 57495.0*     Other results:   Imaging: No results found.   Medications:     Scheduled Medications: . acetaminophen  1,000 mg Oral Q6H   Or  . acetaminophen (TYLENOL) oral liquid 160 mg/5 mL  975 mg Per Tube Q6H  . amLODipine  10 mg Oral Daily  . aspirin EC  325 mg Oral Daily   Or  . aspirin  324 mg Per Tube Daily  . bisacodyl  10 mg Oral Daily   Or  . bisacodyl  10 mg Rectal Daily  . carvedilol  6.25 mg Oral BID WC  .  darbepoetin (ARANESP) injection - NON-DIALYSIS  60 mcg Subcutaneous Q Fri-1800  . docusate sodium  200 mg Oral Daily  . hydrALAZINE  75 mg Oral Q8H  . insulin aspart  2-6 Units Subcutaneous Q4H  . isosorbide dinitrate  10 mg Oral TID  . metoCLOPramide  5 mg Oral TID AC & HS  . pantoprazole sodium  40 mg Per Tube Daily  . sodium chloride  3 mL Intravenous Q12H    Infusions: . sodium chloride Stopped (03/02/13 1400)  . sodium chloride    . sodium chloride    . lactated ringers      PRN Medications: metoprolol, morphine injection, ondansetron (ZOFRAN) IV, oxyCODONE, sodium chloride   Assessment:   1. Acute on chronic diastolic chf with anasarca 2. Hypertensive crisis 3. Acute respiratory failure 4. Acute/chronic renal failure:  (baseline 2.5) Now on HD 5. DM 6. Large pericardial effusion - s/p window 5/21 7. Protein calorie malnutrition (albumin 2.3) 8. Urinary light chains      -Fat pad biopsy negative but inadequate sample.   Plan/Discussion:    Much improved.  Suspect she may need chronic dialysis for volume management.  Still volume overloaded on exam with elevated JVP, to get HD today.   Will increase hydralazine to 75 mg q8 and change metoprolol to Coreg given HTN.   Marca Ancona 03/03/2013 7:43 AM

## 2013-03-04 DIAGNOSIS — G4730 Sleep apnea, unspecified: Secondary | ICD-10-CM

## 2013-03-04 LAB — CBC
MCH: 27.5 pg (ref 26.0–34.0)
MCHC: 31 g/dL (ref 30.0–36.0)
Platelets: 252 10*3/uL (ref 150–400)

## 2013-03-04 LAB — BASIC METABOLIC PANEL
BUN: 22 mg/dL (ref 6–23)
Calcium: 8.2 mg/dL — ABNORMAL LOW (ref 8.4–10.5)
GFR calc Af Amer: 32 mL/min — ABNORMAL LOW (ref >90)
GFR calc non Af Amer: 28 mL/min — ABNORMAL LOW (ref >90)
Glucose, Bld: 131 mg/dL — ABNORMAL HIGH (ref 70–99)
Potassium: 3.7 mEq/L (ref 3.5–5.1)
Sodium: 138 mEq/L (ref 135–145)

## 2013-03-04 LAB — GLUCOSE, CAPILLARY
Glucose-Capillary: 143 mg/dL — ABNORMAL HIGH (ref 70–99)
Glucose-Capillary: 165 mg/dL — ABNORMAL HIGH (ref 70–99)
Glucose-Capillary: 169 mg/dL — ABNORMAL HIGH (ref 70–99)
Glucose-Capillary: 194 mg/dL — ABNORMAL HIGH (ref 70–99)

## 2013-03-04 MED ORDER — SODIUM CHLORIDE 0.9 % IV SOLN: 100.0000 mL | INTRAVENOUS | Status: DC | PRN

## 2013-03-04 MED ORDER — PANTOPRAZOLE SODIUM 40 MG PO TBEC
40.0000 mg | DELAYED_RELEASE_TABLET | Freq: Every day | ORAL | Status: DC
  Administered 2013-03-04 – 2013-03-06 (×3): 40 mg via ORAL
  Filled 2013-03-04 (×3): qty 1

## 2013-03-04 MED ORDER — CARVEDILOL 12.5 MG PO TABS
12.5000 mg | ORAL_TABLET | Freq: Two times a day (BID) | ORAL | Status: DC
  Administered 2013-03-04 – 2013-03-06 (×3): 12.5 mg via ORAL
  Filled 2013-03-04 (×6): qty 1

## 2013-03-04 MED ORDER — ALTEPLASE 2 MG IJ SOLR
2.0000 mg | Freq: Once | INTRAMUSCULAR | Status: AC | PRN
  Filled 2013-03-04: qty 2

## 2013-03-04 MED ORDER — LIDOCAINE HCL (PF) 1 % IJ SOLN: 5.0000 mL | INTRAMUSCULAR | Status: DC | PRN

## 2013-03-04 MED ORDER — ISOSORBIDE DINITRATE 5 MG PO TABS
15.0000 mg | ORAL_TABLET | Freq: Three times a day (TID) | ORAL | Status: DC
Start: 2013-03-04 — End: 2013-03-04
  Filled 2013-03-04 (×3): qty 1

## 2013-03-04 MED ORDER — INSULIN ASPART 100 UNIT/ML ~~LOC~~ SOLN
2.0000 [IU] | Freq: Three times a day (TID) | SUBCUTANEOUS | Status: DC
  Administered 2013-03-04 (×2): 4 [IU] via SUBCUTANEOUS
  Administered 2013-03-04: 2 [IU] via SUBCUTANEOUS
  Administered 2013-03-05: 6 [IU] via SUBCUTANEOUS
  Administered 2013-03-05: 4 [IU] via SUBCUTANEOUS
  Administered 2013-03-06: 2 [IU] via SUBCUTANEOUS
  Administered 2013-03-06: 4 [IU] via SUBCUTANEOUS

## 2013-03-04 MED ORDER — PENTAFLUOROPROP-TETRAFLUOROETH EX AERO: 1.0000 "application " | INHALATION_SPRAY | CUTANEOUS | Status: DC | PRN

## 2013-03-04 MED ORDER — HYDRALAZINE HCL 50 MG PO TABS
100.0000 mg | ORAL_TABLET | Freq: Three times a day (TID) | ORAL | Status: DC
  Administered 2013-03-04 – 2013-03-06 (×7): 100 mg via ORAL
  Filled 2013-03-04 (×9): qty 2

## 2013-03-04 MED ORDER — HEPARIN SODIUM (PORCINE) 1000 UNIT/ML DIALYSIS
1000.0000 [IU] | INTRAMUSCULAR | Status: DC | PRN
  Filled 2013-03-04: qty 1

## 2013-03-04 MED ORDER — HEPARIN SODIUM (PORCINE) 1000 UNIT/ML DIALYSIS
20.0000 [IU]/kg | INTRAMUSCULAR | Status: DC | PRN
  Filled 2013-03-04: qty 2

## 2013-03-04 MED ORDER — ISOSORBIDE MONONITRATE ER 30 MG PO TB24
30.0000 mg | ORAL_TABLET | Freq: Two times a day (BID) | ORAL | Status: DC
  Administered 2013-03-04 (×2): 30 mg via ORAL
  Filled 2013-03-04 (×6): qty 1

## 2013-03-04 MED ORDER — LIDOCAINE-PRILOCAINE 2.5-2.5 % EX CREA
1.0000 "application " | TOPICAL_CREAM | CUTANEOUS | Status: DC | PRN
  Filled 2013-03-04: qty 5

## 2013-03-04 MED ORDER — NEPRO/CARBSTEADY PO LIQD
237.0000 mL | ORAL | Status: DC | PRN
  Filled 2013-03-04: qty 237

## 2013-03-04 NOTE — Progress Notes (Signed)
Clip process started per Dr Scherrie Gerlach request. Paperwork faxed to clip office, however, it is unclear if she will need chronic HD at this point.

## 2013-03-04 NOTE — Progress Notes (Signed)
  I called pathology and they were able to fax me results of myocardial biopsy.  No evidence of amyloid or other infiltrative process.   Nahshon Reich,MD 8:02 PM

## 2013-03-04 NOTE — Evaluation (Signed)
Physical Therapy Evaluation Patient Details Name: Gail Chapman MRN: [id number] DOB: Oct 27, 1955 Today's Date: 03/04/2013 Time: 4098-1191 PT Time Calculation (min): 35 min  PT Assessment / Plan / Recommendation Clinical Impression  Patient is a 57 y/o female admitted with severe anasarca, hypertensive emergency and acute CHF.  She presents now s/p significant diuresis and pericardial window with decreased independence with mobility due to LE weakness, decreased balance, decreased activity tolerance and will benefit from skilled PT in the acute setting to maximize independence and safety and allow return home with HHPT and assist from spouse and nephew.  Oddly pt with significant LE weakness left worse than right and she reports to me due to having MS; however, cannot find as a diagnosis in current chart or in review of previous encounters.      PT Assessment  Patient needs continued PT services    Follow Up Recommendations  Home health PT;Supervision/Assistance - 24 hour          Equipment Recommendations  Rolling walker with 5" wheels       Frequency Min 3X/week    Precautions / Restrictions Precautions Precautions: Fall Restrictions Weight Bearing Restrictions: No   Pertinent Vitals/Pain Denies pain      Mobility  Bed Mobility Bed Mobility: Supine to Sit;Sit to Supine;Sitting - Scoot to [place] of Bed;Scooting to HOB Supine to Sit: 4: Min assist;With rails;HOB elevated Sitting - Scoot to [place] of Bed: 4: Min assist Sit to Supine: HOB flat;3: Mod assist Scooting to HOB: 1: +2 Total assist Scooting to [facility]: Patient Percentage: 10% Details for Bed Mobility Assistance: pt able to roll and get feet off edge of bed, but needed help to lift trunk; to supine needed assist to lift both feet in bed Transfers Transfers: Sit to Stand;Stand to Sit Sit to Stand: 5: Supervision;From bed;With upper extremity assist Stand to Sit: 4: Min guard;To bed Details for Transfer Assistance: cues  for positioning close to head of bed Ambulation/Gait Ambulation/Gait Assistance: 4: Min assist Ambulation Distance (Feet): 70 Feet Assistive device: Rolling walker Ambulation/Gait Assistance Details: difficulty clearing feet from floor, demonstrates hip external rotation bilateral left greater than right with left toes hitting walker during swing phase.   Gait Pattern: Step-through pattern;Decreased stride length;Decreased hip/knee flexion - right;Decreased hip/knee flexion - left;Shuffle;Left circumduction;Wide base of support;Lateral trunk lean to left    Exercises General Exercises - Lower Extremity Hip Flexion/Marching: AROM;5 reps;Both;Seated   PT Diagnosis: Difficulty walking;Generalized weakness;Abnormality of gait  PT Problem List: Decreased activity tolerance;Decreased strength;Decreased knowledge of use of DME;Decreased balance;Decreased mobility PT Treatment Interventions: DME instruction;Gait training;Stair training;Functional mobility training;Therapeutic activities;Therapeutic exercise;Patient/family education;Balance training   PT Goals Acute Rehab PT Goals PT Goal Formulation: With patient Time For Goal Achievement: 03/18/13 Potential to Achieve Goals: Good Pt will go Supine/Side to Sit: with modified independence PT Goal: Supine/Side to Sit - Progress: Revised due to lack of progress Pt will go Sit to Supine/Side: with min assist PT Goal: Sit to Supine/Side - Progress: Goal set today Pt will go Sit to Stand: with modified independence;with upper extremity assist PT Goal: Sit to Stand - Progress: Goal set today Pt will Ambulate: 51 - 150 feet;with rolling walker;with modified independence PT Goal: Ambulate - Progress: Goal set today Pt will Go Up / Down Stairs: 6-9 stairs;with min assist;with rail(s) PT Goal: Up/Down Stairs - Progress: Goal set today  Visit Information  Last PT Received On: 03/04/13 Assistance Needed: +1    Subjective Data  Subjective: I've been up  walking some Patient Stated Goal: To go home with spouse assist   Prior Functioning  Home Living Lives With: Spouse;Family Available Help at Discharge: Available PRN/intermittently;Family Type of Home: House Home Access: Stairs to enter [employer] of Steps: 6 Entrance Stairs-Rails: Can reach both;Right;Left Home Layout: One level Bathroom Shower/Tub: Walk-in shower;Door [employer] Toilet: Standard Home Adaptive Equipment: Shower chair with back;Walker - standard;Hand-held shower hose;Bedside commode/3-in-1 Prior Function Level of Independence: Independent with assistive device(s) Able to Take Stairs?: Yes Driving: No Communication Communication: No difficulties Dominant Hand: Right    Cognition  Cognition Arousal/Alertness: Awake/alert Behavior During Therapy: WFL for tasks assessed/performed Overall Cognitive Status: Within Functional Limits for tasks assessed    Extremity/Trunk Assessment Right Lower Extremity Assessment RLE ROM/Strength/Tone: Deficits RLE ROM/Strength/Tone Deficits: Hip flexion 3-/5, knee extension 4/5, ankle dorsiflexion 4/5 Left Lower Extremity Assessment LLE ROM/Strength/Tone: Deficits LLE ROM/Strength/Tone Deficits: hip flexion 2+/5, knee extension 4-/5, ankle dorsiflexion 3+/5   Balance Balance Balance Assessed: Yes Static Sitting Balance Static Sitting - Balance Support: Feet supported;Right upper extremity supported Static Sitting - Level of Assistance: 6: Modified independent (Device/Increase time)  End of Session PT - End of Session Equipment Utilized During Treatment: Gait belt;Oxygen Activity Tolerance: Patient tolerated treatment well Patient left: in bed;with family/visitor present;with call bell/phone within reach  GP     [facility] 03/04/2013, 1:13 PM [place], [place] [phone] 03/04/2013\

## 2013-03-04 NOTE — Progress Notes (Signed)
Patient ID: Gail Chapman, female   DOB: 01-25-1956, 58 y.o.   MRN: [id number]    Subjective:    57 y/o female with a h/o severe HTN, DM, diastolic HF and PAH. Admitted 5/14 with HTN crisis (BP 260/120) and massive volume overload. Failed high dose lasix and ultrafiltration. RHC 5/20 with markedly elevated biventricular pressures c/w restrictive physiology.   On 5/21 underwent pericaridal window for large effusion and myocardial biopsy (exclude amyloid). Also intubated for respiratory failure and perm cath placed for initiation of HD.   Fat pad biopsy negative but not really an adequate sample.   02/26/13 Myocardial biopsy- pending  Nicardipine stopped 5/24. Yesterday hydralazine increased to 75 mg tid and metoprolol stopped and switched to coreg.  Also HD performed.  Weight unchanged 166 pounds. Overall weight down 47 pounds.   Denies SOB/PND/Orthopnea   Objective:    Vital Signs:   Temp:  [97.9 F (36.6 C)-98.5 F (36.9 C)] 97.9 F (36.6 C) (05/27 0400) Pulse Rate:  [65-92] 72 (05/27 0700) Resp:  [10-28] 17 (05/27 0700) BP: (137-194)/(55-89) 178/70 mmHg (05/27 0600) SpO2:  [96 %-100 %] 96 % (05/27 0700) FiO2 (%):  [40 %] 40 % (05/27 0400) Weight:  [166 lb 0.1 oz (75.3 kg)-178 lb 2.1 oz (80.8 kg)] 166 lb 3.6 oz (75.4 kg) (05/27 0400)    Weight change: Filed Weights   03/03/13 1027 03/03/13 1434 03/04/13 0400  Weight: 178 lb 2.1 oz (80.8 kg) 166 lb 0.1 oz (75.3 kg) 166 lb 3.6 oz (75.4 kg)    Intake/Output:   Intake/Output Summary (Last 24 hours) at 03/04/13 6962 Last data filed at 03/04/13 0600  Gross per 24 hour  Intake    880 ml  Output  95284 ml  Net  -9785 ml     Physical Exam: Sitting in chair General: Alert chronically ill. NAD Neuro: Normal and non focal.  HEENT:  L chest perm cath L subclavian CVL Neck: Supple without bruits. JVP 7-8 cm. Carotids 2+ bilaterally without bruits  Lungs: Resp regular and unlabored, clear anteriorly Heart: RRR no s3, + s4, 2/6  TR  And SEM Abdomen: mildly distended. nontender, BS + x 4.  Extremities: No clubbing, cyanosis. 1+ bilat lower extremities up to knees   Telemetry: SR 60-70s  Labs: Basic Metabolic Panel:  Recent Labs Lab 02/27/13 0348 02/28/13 0400 03/01/13 0400 03/02/13 0800 03/03/13 1052 03/04/13 0430  NA 137 135 138 135 135 138  K 4.8 4.0 3.3* 3.9 4.1 3.7  CL 101 99 102 99 99 102  CO2 26 23 24 27 27 29   GLUCOSE 118* 130* 122* 107* 147* 131*  BUN 67* 36* 23 15 23 22   CREATININE 3.49* 2.25* 1.72* 1.64* 2.14* 1.95*  CALCIUM 8.5 8.6 8.5 8.5 8.5 8.2*  MG  --  2.0 2.0  --   --   --   PHOS  --  5.2* 4.3  --  3.3  --     Liver Function Tests:  Recent Labs Lab 02/26/13 1300 03/03/13 1052  AST 19  --   ALT 19  --   ALKPHOS 115  --   BILITOT 0.3  --   PROT 6.4  --   ALBUMIN 2.3* 1.9*   No results found for this basename: LIPASE, AMYLASE,  in the last 168 hours  Recent Labs Lab 02/26/13 0415  AMMONIA 23    CBC:  Recent Labs Lab 02/27/13 0348 02/28/13 0400 03/01/13 0400 03/03/13 1053 03/04/13 0430  WBC 12.2*  12.1* 15.1* 11.4* 10.2  HGB 8.9* 9.0* 9.0* 8.7* 8.4*  HCT 27.8* 27.9* 27.3* 27.9* 27.1*  MCV 83.2 84.0 84.0 87.5 88.6  PLT 216 211 262 283 252    Cardiac Enzymes: No results found for this basename: CKTOTAL, CKMB, CKMBINDEX, TROPONINI,  in the last 168 hours  BNP: BNP (last 3 results)  Recent Labs  02/19/13 1429 02/20/13 0500  PROBNP 45515.0* 57495.0*     Other results:   Imaging: No results found.   Medications:     Scheduled Medications: . amLODipine  10 mg Oral Daily  . aspirin EC  325 mg Oral Daily   Or  . aspirin  324 mg Per Tube Daily  . bisacodyl  10 mg Oral Daily   Or  . bisacodyl  10 mg Rectal Daily  . carvedilol  6.25 mg Oral BID WC  . darbepoetin (ARANESP) injection - NON-DIALYSIS  60 mcg Subcutaneous Q Fri-1800  . docusate sodium  200 mg Oral Daily  . hydrALAZINE  75 mg Oral Q8H  . insulin aspart  2-6 Units Subcutaneous Q4H   . isosorbide dinitrate  10 mg Oral TID  . metoCLOPramide  5 mg Oral TID AC & HS  . pantoprazole sodium  40 mg Per Tube Daily  . sodium chloride  3 mL Intravenous Q12H    Infusions: . sodium chloride Stopped (03/02/13 1400)  . sodium chloride    . sodium chloride    . lactated ringers      PRN Medications: metoprolol, morphine injection, ondansetron (ZOFRAN) IV, oxyCODONE, sodium chloride   Assessment:   1. Acute on chronic diastolic chf with anasarca 2. Hypertensive crisis 3. Acute respiratory failure 4. Acute/chronic renal failure:  (baseline 2.5) Now on HD 5. DM 6. Large pericardial effusion - s/p window 5/21 7. Protein calorie malnutrition (albumin 2.3) 8. Urinary light chains      -Fat pad biopsy negative but inadequate sample.  9. Deconditioning  Plan/Discussion:    Volume status improving but still with volume on board. Weight unchanged. Add ted hose. Remains oliguric. Per Dr Caryn Section outpatient HD being set up in [place].   SBP remains markedly elevated. Will increase hydralazine to 100 mg q8 and carvedilol to 12.5 bid. Change isordil to imdur 30 bid.  Myocardial biopsy pending - should be back today.   Consult PT/OT.   CLEGG,AMY NP-C 03/04/2013 7:12 AM  Patient seen and examined with Tonye Becket, NP. We discussed all aspects of the encounter. I agree with the assessment and plan as stated above. Volume status much improved. Being set up for outpatient HD. Main issue now is BP control. Will transfer to floor. Hopefully home next 24-48 hours. She is unable to tolerate clonidine.    Await myocardial biopsy results.  Isabel Freese,MD 7:34 AM

## 2013-03-04 NOTE — Progress Notes (Signed)
[facility]  Subjective:  Awake, alert, says she feels better   Objective: Vital signs in last 24 hours: Blood pressure 195/76, pulse 72, temperature 97.7 F (36.5 C), temperature source Oral, resp. rate 17, height 5' 1.5" (1.562 m), weight 75.4 kg (166 lb 3.6 oz), SpO2 96.00%.    PHYSICAL EXAM General--as above Chest--L IJ TDC,  pericardial window op site ok, clear to ausc Heart--no rub Abd--nontender Extr--1+ edema  275 cc urine out over last 24 hr  Lab Results:   Recent Labs Lab 02/28/13 0400 03/01/13 0400 03/02/13 0800 03/03/13 1052 03/04/13 0430  NA 135 138 135 135 138  K 4.0 3.3* 3.9 4.1 3.7  CL 99 102 99 99 102  CO2 23 24 27 27 29   BUN 36* 23 15 23 22   CREATININE 2.25* 1.72* 1.64* 2.14* 1.95*  GLUCOSE 130* 122* 107* 147* 131*  CALCIUM 8.6 8.5 8.5 8.5 8.2*  PHOS 5.2* 4.3  --  3.3  --      Recent Labs  03/03/13 1053 03/04/13 0430  WBC 11.4* 10.2  HGB 8.7* 8.4*  HCT 27.9* 27.1*  PLT 283 252     I have reviewed the patient's current medications. Scheduled: . amLODipine  10 mg Oral Daily  . aspirin EC  325 mg Oral Daily   Or  . aspirin  324 mg Per Tube Daily  . bisacodyl  10 mg Oral Daily   Or  . bisacodyl  10 mg Rectal Daily  . carvedilol  12.5 mg Oral BID WC  . darbepoetin (ARANESP) injection - NON-DIALYSIS  60 mcg Subcutaneous Q Fri-1800  . docusate sodium  200 mg Oral Daily  . hydrALAZINE  100 mg Oral Q8H  . insulin aspart  2-6 Units Subcutaneous Q4H  . isosorbide mononitrate  30 mg Oral BID  . metoCLOPramide  5 mg Oral TID AC & HS  . pantoprazole sodium  40 mg Per Tube Daily  . sodium chloride  3 mL Intravenous Q12H   Continuous: . sodium chloride Stopped (03/02/13 1400)  . sodium chloride    . sodium chloride    . lactated ringers      Assessment/Plan: 1. Acute renal failure superimposed on chronic kidney disease: Weight has decreased from 96.6 kg on 14 May to 75.4 kg today. She still has significant pretib edema  and high BP. HD is planned for tomorrow with 4-5 L UF goal. I suspect she will need HD in [place] and will arrange for outpatient HD unless renal fct improves.  Save L forearm for vascular access.  Check 24 hr urine for protein and Cr.  PTH 99 on 21 May  2. Diastolic dysfunction/congestive heart failure: Continue supportive therapy with dialysis for volume management-still remains significantly volume overloaded and getting closer to being euvolemic.  3. Anemia: Given intravenous iron (1020 mg feraheme on 22 May), currently on Aranesp. HGB 9 on 24 May---8.4 today  4. nausea/vomiting/history of gastroparesis: better with reglan 5 mg po QID  5. Hypertension: On po hydralazine, amlodipine, metoprolol, and isordil  6. Pericardial window: done on 28 Feb 2013. Myocardial bx pending.  Per TCTS    LOS: 13 days   Romello Hoehn F 03/04/2013,8:02 AM   .labalb

## 2013-03-04 NOTE — Progress Notes (Signed)
PULMONARY  / CRITICAL CARE MEDICINE  Name: Gail Chapman MRN: [id number] DOB: [birth year]    ADMISSION DATE:  02/19/2013 CONSULTATION DATE:  02/26/13  REFERRING MD :  Dr. Shirlee Latch PRIMARY SERVICE: Cardiology  CHIEF COMPLAINT:  Altered Mental Status  BRIEF PATIENT DESCRIPTION:  57 y/o F with PMH of poorly controlled HTN, DM,  Diastolic CHF, initially admitted to [facility] with a 2-3 month hx of progressive weight gain & dyspnea, diuresed and discharged to Rehab.  Worsened at Rehab and was transferred to [facility] 5/14 for further care.  5/21 had progressive lethargy in the setting of worsened renal failure / antianxiety meds.  Questionable dx of amyloidosis.  PCCM consulted for AMS / Acute Respiratory Failure.    SIGNIFICANT EVENTS / STUDIES:  5/14 - Admit to [facility] with progressive renal fx, worsening swelling / wt gain, dyspnea 5/15 - Ultrafiltration initiated, continued until 5/19 5/20 - RHC >>Marked elevation of RV/LV pressures, Pulmonary HTN, Nml Cardiac output  LINES / TUBES: OETT 5/21>>>5-23 L IJ Tunneled HD catheter 5/21>>> Mediastinal chest tube 5/21>>>5/23 L Radial a-line 5/21>>> L [place] cvl>> CULTURES: 5/14 MRSA PCR>>>neg  ANTIBIOTICS: none  SUBJECTIVE: Arousable and interactive. VITAL SIGNS: Temp:  [97.7 F (36.5 C)-98.5 F (36.9 C)] 97.7 F (36.5 C) (05/27 0728) Pulse Rate:  [65-92] 74 (05/27 0800) Resp:  [10-28] 14 (05/27 0800) BP: (137-195)/(55-89) 169/65 mmHg (05/27 0800) SpO2:  [96 %-100 %] 97 % (05/27 0800) FiO2 (%):  [40 %] 40 % (05/27 0400) Weight:  [75.3 kg (166 lb 0.1 oz)-80.8 kg (178 lb 2.1 oz)] 75.4 kg (166 lb 3.6 oz) (05/27 0400)  HEMODYNAMICS:    VENTILATOR SETTINGS: Vent Mode:  [-]  FiO2 (%):  [40 %] 40 %  INTAKE / OUTPUT: Intake/Output     05/26 0701 - 05/27 0700 05/27 0701 - 05/28 0700   P.O. 880    I.V. (mL/kg)     Total Intake(mL/kg) 880 (11.7)    Urine (mL/kg/hr) 275 (0.2)    Other 10400 (5.7)    Total Output 40981     Net -9795         PHYSICAL EXAMINATION: General: chronically ill adult female. Neuro:Intact HEENT:  Mm pink/moist Cardiovascular:  s1s2 rrr, no m/r/g Lungs: diminished in bases Abdomen:  Round/soft, bsx4 active. Musculoskeletal:  No acute deformities Skin:  Warm/dry  LABS:  Recent Labs Lab 02/26/13 1300  02/26/13 1745  02/27/13 0340  02/28/13 0400 02/28/13 1842 03/01/13 0400 03/02/13 0800 03/03/13 1052 03/03/13 1053 03/04/13 0430  HGB  --   < >  --   < >  --   < > 9.0*  --  9.0*  --   --  8.7* 8.4*  WBC  --   < >  --   --   --   < > 12.1*  --  15.1*  --   --  11.4* 10.2  PLT  --   < >  --   --   --   < > 211  --  262  --   --  283 252  NA 137  < >  --   < >  --   < > 135  --  138 135 135  --  138  K 4.8  < >  --   < >  --   < > 4.0  --  3.3* 3.9 4.1  --  3.7  CL 100  --   --   --   --   < >  99  --  102 99 99  --  102  CO2 25  --   --   --   --   < > 23  --  24 27 27   --  29  GLUCOSE 130*  --   --   < >  --   < > 130*  --  122* 107* 147*  --  131*  BUN 64*  --   --   --   --   < > 36*  --  23 15 23   --  22  CREATININE 3.28*  --   --   --   --   < > 2.25*  --  1.72* 1.64* 2.14*  --  1.95*  CALCIUM 8.9  --   --   --   --   < > 8.6  --  8.5 8.5 8.5  --  8.2*  MG  --   --   --   --   --   --  2.0  --  2.0  --   --   --   --   PHOS  --   --   --   --   --   --  5.2*  --  4.3  --  3.3  --   --   AST 19  --   --   --   --   --   --   --   --   --   --   --   --   ALT 19  --   --   --   --   --   --   --   --   --   --   --   --   ALKPHOS 115  --   --   --   --   --   --   --   --   --   --   --   --   BILITOT 0.3  --   --   --   --   --   --   --   --   --   --   --   --   PROT 6.4  --   --   --   --   --   --   --   --   --   --   --   --   ALBUMIN 2.3*  --   --   --   --   --   --   --   --   --  1.9*  --   --   PHART  --   < > 7.475*  --  7.391  --   --  7.487*  --   --   --   --   --   PCO2ART  --   < > 37.4  --  42.6  --   --  39.0  --   --   --   --   --   PO2ART  --   < > 415.0*  --   92.1  --   --  99.0  --   --   --   --   --   < > = values in this interval not displayed.  Recent Labs Lab 03/03/13 1535 03/03/13 1936 03/03/13 2347 03/04/13 0352 03/04/13 0726  GLUCAP 108* 176* 165* 131* 129*   Imaging:  No results found.   ASSESSMENT / PLAN:  PULMONARY A: Acute Respiratory Failure - in setting of progressive renal failure OSA - on bipap qhs  at baseline  PAH - noted on RHC, 96/41 (59)  P:   - Extubated 5-23 - Will need bipap QHS as at home - Titrate O2 and begin mobilization/PT/OT/OOB.  CARDIOVASCULAR A:  Hypertensive Crisis CHF - with acute decompensation  P:  - CHF mgmt per Advanced Heart Failure Team. - Per cards.  RENAL A:   Acute Renal Failure - no hydronephrosis on renal US 5/19 Hyponatremia   P:   - Per renal - F/U bmp per renal - Dialysis being arranged in [place].  GASTROINTESTINAL A:   SUP   P:   - Protonix for DVT proph. - Renal diet  HEMATOLOGIC A:   Anemia - in setting of chronic illness. No evidence of acute bleeding.   P:  - Monitor H/H - Consider tx if hgb <7%  INFECTIOUS A:   No evidence of acute infectious process.   P:   - Monitor fever curve/wbc.  ENDOCRINE A:   DM   P:   - ICU SSI.  NEUROLOGIC A:   Acute Encephalopathy - in setting of renal failure; resolved.  P:    Resolved 5-24 - Precedex, dc'd 5-23   Global:  Leaving ICU 5/27.  Bipap ordered QHS and PCCM signing off, please call back if needed.  Alyson Reedy, M.D. [facility] Pulmonary/Critical Care Medicine. Pager: [phone]. After hours pager: [phone].

## 2013-03-05 LAB — RENAL FUNCTION PANEL
BUN: 33 mg/dL — ABNORMAL HIGH (ref 6–23)
CO2: 27 mEq/L (ref 19–32)
Chloride: 100 mEq/L (ref 96–112)
Creatinine, Ser: 2.59 mg/dL — ABNORMAL HIGH (ref 0.50–1.10)
GFR calc non Af Amer: 20 mL/min — ABNORMAL LOW (ref >90)
Potassium: 4.3 mEq/L (ref 3.5–5.1)

## 2013-03-05 LAB — GLUCOSE, CAPILLARY
Glucose-Capillary: 158 mg/dL — ABNORMAL HIGH (ref 70–99)
Glucose-Capillary: 186 mg/dL — ABNORMAL HIGH (ref 70–99)
Glucose-Capillary: 112 mg/dL — ABNORMAL HIGH (ref 70–99)
Glucose-Capillary: 104 mg/dL — ABNORMAL HIGH (ref 70–99)
Glucose-Capillary: 202 mg/dL — ABNORMAL HIGH (ref 70–99)

## 2013-03-05 LAB — CBC
HCT: 27.2 % — ABNORMAL LOW (ref 36.0–46.0)
Hemoglobin: 8.6 g/dL — ABNORMAL LOW (ref 12.0–15.0)
MCH: 27.8 pg (ref 26.0–34.0)
MCHC: 31.6 g/dL (ref 30.0–36.0)
MCV: 88 fL (ref 78.0–100.0)
Platelets: 240 10*3/uL (ref 150–400)
RBC: 3.09 MIL/uL — ABNORMAL LOW (ref 3.87–5.11)
RDW: 18.4 % — ABNORMAL HIGH (ref 11.5–15.5)
WBC: 17.1 10*3/uL — ABNORMAL HIGH (ref 4.0–10.5)

## 2013-03-05 LAB — PROTEIN, URINE, 24 HOUR: Protein, 24H Urine: 3126 mg/d — ABNORMAL HIGH (ref 50–100)

## 2013-03-05 LAB — CREATININE, URINE, 24 HOUR
Collection Interval-UCRE24: 24 hours
Urine Total Volume-UCRE24: 300 mL

## 2013-03-05 MED ORDER — DOXAZOSIN MESYLATE 1 MG PO TABS
1.0000 mg | ORAL_TABLET | Freq: Every day | ORAL | Status: DC
  Administered 2013-03-05: 1 mg via ORAL
  Filled 2013-03-05: qty 1

## 2013-03-05 MED ORDER — ISOSORBIDE MONONITRATE ER 60 MG PO TB24
60.0000 mg | ORAL_TABLET | Freq: Two times a day (BID) | ORAL | Status: DC
  Administered 2013-03-05 – 2013-03-06 (×2): 60 mg via ORAL
  Filled 2013-03-05 (×4): qty 1

## 2013-03-05 MED ORDER — ONDANSETRON HCL 4 MG/2ML IJ SOLN
4.0000 mg | INTRAMUSCULAR | Status: AC
  Administered 2013-03-05: 4 mg via INTRAVENOUS

## 2013-03-05 MED ORDER — DOXAZOSIN MESYLATE 2 MG PO TABS
2.0000 mg | ORAL_TABLET | Freq: Every day | ORAL | Status: DC
  Administered 2013-03-06: 2 mg via ORAL
  Filled 2013-03-05: qty 1

## 2013-03-05 NOTE — Progress Notes (Signed)
PT Cancel Note:    Pt in HD at this time.  Will f/u tomorrow.    Thanks  [place], [place] [phone] 03/05/2013

## 2013-03-05 NOTE — Progress Notes (Signed)
Pt placed on CPAP for night (7-20 Auto titrate 3lpm O2 bleed in via FFM).  Pt tolerating well.  RT will monitor as needed.

## 2013-03-05 NOTE — Progress Notes (Signed)
03/05/2013 11:32 AM Hemodialysis Outpatient Note; this patient has been accepted at the [facility] on a Tues/Thurs/Sat 2nd shift schedule. Please have patient arrive for 1200 on her first day. Thank you. Tilman Neat

## 2013-03-05 NOTE — Progress Notes (Signed)
Patient had nausea this evening and therefore PRN zofran given. Patient states that this also occurs at home and at night where if she sits up such as sitting on the side of the bed after lying down for a while, patient will become nauseous and may vomit. Patient states it is due to having gastroparesis. Patient states her doctor has told her that. Currently patient is asymptomatic but will continue to monitor until the end of shift.

## 2013-03-05 NOTE — Progress Notes (Signed)
NUTRITION FOLLOW UP  Intervention:   May benefit from increasing diet to Renal 80/90  Nutrition Dx:   Inadequate oral intake related to inability to eat as evidenced by NPO status. Resolved   Goal:   PO intake to meet >/=80% estimated nutrition needs. Likely met  Monitor:   PO intake, weight trends, labs   Assessment:   Pt with massive volume overload on admission, now weight is down 49 lbs from admission.  S/p pericardial window for large effusion and biopsy. Perm cath placed for initiation of HD. Extubated on 5/23. Pathology of myocardial biopsy was egative for amyloid or other infiltrative process per notes. Set up for ongoing HD in [place].  Pt has had several episodes of vomiting. Husband states pt with gastroparesis and takes a phenergan nightly or will vomit in the morning.   Height: Ht Readings from Last 1 Encounters:  03/04/13 5\' 5"  (1.651 m)    Weight Status:   Wt Readings from Last 1 Encounters:  03/05/13 164 lb 11.2 oz (74.707 kg)  Body mass index is 27.41 kg/(m^2). overweight, 130% ideal body weight.   Re-estimated needs:  Kcal: 2100-2300 Protein: >/= 85 gm daily Fluid: 1.2 L   Skin: chest incisions   Diet Order: Renal 60/70   Intake/Output Summary (Last 24 hours) at 03/05/13 1135 Last data filed at 03/05/13 1045  Gross per 24 hour  Intake    340 ml  Output    212 ml  Net    128 ml    Last BM: none documented    Labs:   Recent Labs Lab 02/28/13 0400 03/01/13 0400  03/03/13 1052 03/04/13 0430 03/05/13 0722  NA 135 138  < > 135 138 135  K 4.0 3.3*  < > 4.1 3.7 4.3  CL 99 102  < > 99 102 100  CO2 23 24  < > 27 29 27   BUN 36* 23  < > 23 22 33*  CREATININE 2.25* 1.72*  < > 2.14* 1.95* 2.59*  CALCIUM 8.6 8.5  < > 8.5 8.2* 8.5  MG 2.0 2.0  --   --   --   --   PHOS 5.2* 4.3  --  3.3  --  2.8  GLUCOSE 130* 122*  < > 147* 131* 179*  < > = values in this interval not displayed.  CBG (last 3)   Recent Labs  03/05/13 0228 03/05/13 0408  03/05/13 0625  GLUCAP 157* 158* 186*    Scheduled Meds: . amLODipine  10 mg Oral Daily  . aspirin EC  325 mg Oral Daily   Or  . aspirin  324 mg Per Tube Daily  . bisacodyl  10 mg Oral Daily   Or  . bisacodyl  10 mg Rectal Daily  . carvedilol  12.5 mg Oral BID WC  . darbepoetin (ARANESP) injection - NON-DIALYSIS  60 mcg Subcutaneous Q Fri-1800  . docusate sodium  200 mg Oral Daily  . doxazosin  1 mg Oral Daily  . hydrALAZINE  100 mg Oral Q8H  . insulin aspart  2-6 Units Subcutaneous TID AC & HS  . isosorbide mononitrate  60 mg Oral BID  . metoCLOPramide  5 mg Oral TID AC & HS  . pantoprazole  40 mg Oral Daily  . sodium chloride  3 mL Intravenous Q12H    Continuous Infusions: . lactated ringers      Clarene Duke RD, LDN Pager [phone] After Hours pager [phone]

## 2013-03-05 NOTE — Progress Notes (Signed)
Patient ID: Gail Chapman, female   DOB: 08/29/1956, 57 y.o.   MRN: [id number]    Subjective:    56 y/o female with a h/o severe HTN, DM, diastolic HF and PAH. Admitted 5/14 with HTN crisis (BP 260/120) and massive volume overload. Failed high dose lasix and ultrafiltration. RHC 5/20 with markedly elevated biventricular pressures c/w restrictive physiology.   On 5/21 underwent pericaridal window for large effusion and myocardial biopsy (exclude amyloid). Also intubated for respiratory failure and perm cath placed for initiation of HD.   Fat pad biopsy negative but not really an adequate sample.   02/26/13 Myocardial biopsy- No evidence of amyloid or other infiltrative process.   Nicardipine stopped 5/24. Yesterday hydralazine increased to 100 mg tid, carvedilol increased to 12.5 mg bid, and started on IMDUR 30 mg bid. Weight unchanged 164 pounds. Overall weight down 49 pounds.   Denies SOB/PND/Orthopnea   Objective:    Vital Signs:   Temp:  [97.4 F (36.3 C)-98.5 F (36.9 C)] 97.9 F (36.6 C) (05/28 0517) Pulse Rate:  [73-84] 84 (05/28 0517) Resp:  [14-18] 18 (05/28 0517) BP: (131-194)/(65-76) 170/68 mmHg (05/28 0517) SpO2:  [96 %-100 %] 96 % (05/28 0517) Weight:  [164 lb 11.2 oz (74.707 kg)-164 lb 14.5 oz (74.8 kg)] 164 lb 11.2 oz (74.707 kg) (05/28 0517) Last BM Date: 03/04/13  Weight change: Filed Weights   03/04/13 0400 03/04/13 0958 03/05/13 0517  Weight: 166 lb 3.6 oz (75.4 kg) 164 lb 14.5 oz (74.8 kg) 164 lb 11.2 oz (74.707 kg)    Intake/Output:   Intake/Output Summary (Last 24 hours) at 03/05/13 0757 Last data filed at 03/05/13 0518  Gross per 24 hour  Intake    340 ml  Output    111 ml  Net    229 ml     Physical Exam: Sitting in chair General: Alert chronically ill. NAD Neuro: Normal and non focal.  HEENT:  L chest perm cath L subclavian CVL Neck: Supple without bruits. JVP 7-8 cm. Carotids 2+ bilaterally without bruits  Lungs: Resp regular and unlabored,  clear anteriorly Heart: RRR no s3, + s4, 2/6 TR  And SEM Abdomen: mildly distended. nontender, BS + x 4.  Extremities: No clubbing, cyanosis. 1+ bilat lower extremities up to knees   Telemetry: SR 60-70s  Labs: Basic Metabolic Panel:  Recent Labs Lab 02/27/13 0348 02/28/13 0400 03/01/13 0400 03/02/13 0800 03/03/13 1052 03/04/13 0430  NA 137 135 138 135 135 138  K 4.8 4.0 3.3* 3.9 4.1 3.7  CL 101 99 102 99 99 102  CO2 26 23 24 27 27 29   GLUCOSE 118* 130* 122* 107* 147* 131*  BUN 67* 36* 23 15 23 22   CREATININE 3.49* 2.25* 1.72* 1.64* 2.14* 1.95*  CALCIUM 8.5 8.6 8.5 8.5 8.5 8.2*  MG  --  2.0 2.0  --   --   --   PHOS  --  5.2* 4.3  --  3.3  --     Liver Function Tests:  Recent Labs Lab 02/26/13 1300 03/03/13 1052  AST 19  --   ALT 19  --   ALKPHOS 115  --   BILITOT 0.3  --   PROT 6.4  --   ALBUMIN 2.3* 1.9*   No results found for this basename: LIPASE, AMYLASE,  in the last 168 hours No results found for this basename: AMMONIA,  in the last 168 hours  CBC:  Recent Labs Lab 02/27/13 0348 02/28/13 0400 03/01/13  0400 03/03/13 1053 03/04/13 0430  WBC 12.2* 12.1* 15.1* 11.4* 10.2  HGB 8.9* 9.0* 9.0* 8.7* 8.4*  HCT 27.8* 27.9* 27.3* 27.9* 27.1*  MCV 83.2 84.0 84.0 87.5 88.6  PLT 216 211 262 283 252    Cardiac Enzymes: No results found for this basename: CKTOTAL, CKMB, CKMBINDEX, TROPONINI,  in the last 168 hours  BNP: BNP (last 3 results)  Recent Labs  02/19/13 1429 02/20/13 0500  PROBNP 45515.0* 57495.0*     Other results:   Imaging: No results found.   Medications:     Scheduled Medications: . amLODipine  10 mg Oral Daily  . aspirin EC  325 mg Oral Daily   Or  . aspirin  324 mg Per Tube Daily  . bisacodyl  10 mg Oral Daily   Or  . bisacodyl  10 mg Rectal Daily  . carvedilol  12.5 mg Oral BID WC  . darbepoetin (ARANESP) injection - NON-DIALYSIS  60 mcg Subcutaneous Q Fri-1800  . docusate sodium  200 mg Oral Daily  .  hydrALAZINE  100 mg Oral Q8H  . insulin aspart  2-6 Units Subcutaneous TID AC & HS  . isosorbide mononitrate  30 mg Oral BID  . metoCLOPramide  5 mg Oral TID AC & HS  . pantoprazole  40 mg Oral Daily  . sodium chloride  3 mL Intravenous Q12H    Infusions: . lactated ringers      PRN Medications: sodium chloride, sodium chloride, feeding supplement (NEPRO CARB STEADY), heparin, heparin, lidocaine (PF), lidocaine-prilocaine, morphine injection, ondansetron (ZOFRAN) IV, oxyCODONE, pentafluoroprop-tetrafluoroeth, sodium chloride   Assessment:   1. Acute on chronic diastolic chf with anasarca 2. Hypertensive crisis 3. Acute respiratory failure 4. Acute/chronic renal failure:  (baseline 2.5) Now on HD 5. DM 6. Large pericardial effusion - s/p window 5/21 7. Protein calorie malnutrition (albumin 2.3) 8. Urinary light chains      -Fat pad biopsy negative but inadequate sample.  9. Deconditioning  Plan/Discussion:    Volume status improving but still with volume on board. Weight unchanged. Add ted hose. Remains oliguric. Per Dr Caryn Section outpatient HD being set up in [place]. Save L forearm.   SBP remains markedly elevated.    Myocardial biopsy-  No evidence of amyloid or other infiltrative process.   Per PT - HHPT and rolling walker. Consult PT.   CLEGG,AMY NP-C 03/05/2013 7:57 AM  Volume status improving but still overloaded. Continue HD.   BP remains elevated. Not able to tolerate clonidine. Will add doxazosin.    Myocardial bx - no amyloid. PT evaluation noted - will need HHPT.  Hopefully home tomorrow or Friday if we can get BP under reasonable control.  Daniel Bensimhon,MD 5:15 PM

## 2013-03-05 NOTE — Accreditation Note (Signed)
[facility]  Subjective:  Currently on dialysis via L IJ TDC. She tells me she may be d/c'd tomorrow.  She is approved  for outpatient dialysis at [place], [place] T-Th-Sat 12 noon at [facility] dialysis center   Objective: Vital signs in last 24 hours: Blood pressure 177/75, pulse 86, temperature 98 F (36.7 C), temperature source Oral, resp. rate 15, height 5\' 5"  (1.651 m), weight 74.707 kg (164 lb 11.2 oz), SpO2 96.00%.    PHYSICAL EXAM General--awake, alert Chest--L IJ TDC, pericardial window op site ok, clear to ausc  Heart--no rub  Abd--nontender  Extr--1+ pretibial edema   Lab Results:   Recent Labs Lab 03/01/13 0400  03/03/13 1052 03/04/13 0430 03/05/13 0722  NA 138  < > 135 138 135  K 3.3*  < > 4.1 3.7 4.3  CL 102  < > 99 102 100  CO2 24  < > 27 29 27   BUN 23  < > 23 22 33*  CREATININE 1.72*  < > 2.14* 1.95* 2.59*  GLUCOSE 122*  < > 147* 131* 179*  CALCIUM 8.5  < > 8.5 8.2* 8.5  PHOS 4.3  --  3.3  --  2.8  < > = values in this interval not displayed.   Recent Labs  03/04/13 0430 03/05/13 0722  WBC 10.2 17.1*  HGB 8.4* 8.6*  HCT 27.1* 27.2*  PLT 252 240     I have reviewed the patient's current medications. Scheduled: . amLODipine  10 mg Oral Daily  . aspirin EC  325 mg Oral Daily   Or  . aspirin  324 mg Per Tube Daily  . bisacodyl  10 mg Oral Daily   Or  . bisacodyl  10 mg Rectal Daily  . carvedilol  12.5 mg Oral BID WC  . darbepoetin (ARANESP) injection - NON-DIALYSIS  60 mcg Subcutaneous Q Fri-1800  . docusate sodium  200 mg Oral Daily  . doxazosin  1 mg Oral Daily  . hydrALAZINE  100 mg Oral Q8H  . insulin aspart  2-6 Units Subcutaneous TID AC & HS  . isosorbide mononitrate  60 mg Oral BID  . metoCLOPramide  5 mg Oral TID AC & HS  . pantoprazole  40 mg Oral Daily  . sodium chloride  3 mL Intravenous Q12H   Continuous: . lactated ringers      Assessment/Plan: 1. End Stage Renal disease superimposed on prior chronic  kidney disease: Weight has decreased from 96.6 kg on 14 May to 74.7 kg kg today. She still has  pretib edema and high BP. HD is planned for today 4 L UF goal.  HD in [place] is  arranged forT-Th-Sat at [facility] 12 [address]. Save L forearm for vascular access.  24 hr urine for protein and Cr were sent earlier today--3.126 g protein/24hr and Urine Cr 373 mg/24 hr . PTH 99 on 21 May  2. Diastolic dysfunction/congestive heart failure: Continue dialysis for volume management-still remainsvolume overloaded and getting closer to being euvolemic.  3. Anemia: she was given intravenous iron (1020 mg feraheme on 22 May), currently on Aranesp. HGB 9 on 24 May---8.6 today  4. nausea/vomiting/history of gastroparesis: better with reglan 5 mg po QID  5. Hypertension: On po hydralazine, amlodipine, metoprolol, and isordil  6. Pericardial window: done on 28 Feb 2013. Myocardial bx neg for amyloidosis  Dialysis orders 4 hr, 2K, 2.25 Ca bath, Aranesp 60/week, 400 BFR/800 DFR, no hectorol.  EDW 71 kg.  Access L  IJ TDC   LOS: 14 days   Fishel Wamble F 03/05/2013,2:13 PM   .labalb

## 2013-03-05 NOTE — Progress Notes (Signed)
Utilization Review Completed.   Nikitia Asbill, RN, BSN Nurse Case Manager  [phone]  

## 2013-03-05 NOTE — Progress Notes (Deleted)
03/05/13 1100 Pt. will return to [facility] assisted living and the facility will provide home health services.  Pt. to dc home today. Tera Mater, RN, BSN NCM [phone]

## 2013-03-05 NOTE — Progress Notes (Signed)
At beginning of shift around 2230 patient wanted to sit on side of bed. Patient begin to belch several times and shortly thereafter patient vomited x1. PRN  Zofran given and was effective. Later in the night, around 3am patient wanted to sit on side of the bed once more and once again had large amount of emesis- substances of food noted. Fellow staff nurses notified nurse. After getting patient cleaned up patient vomited 2 more times and substance appeared to be solid food that she had from dinner. Patient stated the meatloaf upset her stomach from dinner. Notified Dr.Turner of this for it was too close to administer PRN, therefore able to get verbal order for a NOW dose of zofran. The zofran was effective and patient has been able to rest for the remainder of the shift. Will continue to monitor to end of shift.

## 2013-03-05 NOTE — Progress Notes (Signed)
03/05/13 1130 In to speak with pt. about home health needs.  Pt. currently going to hemodialysis.  This NCM spoke with spouse, and gave him list of home health agencies to look over. Spouse stated pt. had home health in past, however he could not remember name of agency.   NCM will return for [facility] choice. Tera Mater, RN, BSN NCM [phone]

## 2013-03-06 LAB — GLUCOSE, CAPILLARY

## 2013-03-06 LAB — BASIC METABOLIC PANEL
CO2: 25 mEq/L (ref 19–32)
Calcium: 8.4 mg/dL (ref 8.4–10.5)
Creatinine, Ser: 1.97 mg/dL — ABNORMAL HIGH (ref 0.50–1.10)
Glucose, Bld: 219 mg/dL — ABNORMAL HIGH (ref 70–99)

## 2013-03-06 MED ORDER — ASPIRIN 325 MG PO TBEC: 325.0000 mg | DELAYED_RELEASE_TABLET | Freq: Every day | ORAL | Status: DC

## 2013-03-06 MED ORDER — DOXAZOSIN MESYLATE 2 MG PO TABS: 2.0000 mg | ORAL_TABLET | Freq: Every day | ORAL | Status: DC

## 2013-03-06 MED ORDER — AMLODIPINE BESYLATE 10 MG PO TABS: 10.0000 mg | ORAL_TABLET | Freq: Every day | ORAL | Status: DC

## 2013-03-06 MED ORDER — CARVEDILOL 12.5 MG PO TABS: 12.5000 mg | ORAL_TABLET | Freq: Two times a day (BID) | ORAL | Status: DC

## 2013-03-06 MED ORDER — HYDRALAZINE HCL 100 MG PO TABS: 100.0000 mg | ORAL_TABLET | Freq: Three times a day (TID) | ORAL | Status: DC

## 2013-03-06 MED ORDER — PROMETHAZINE HCL 25 MG RE SUPP: 25.0000 mg | Freq: Every day | RECTAL | Status: AC

## 2013-03-06 NOTE — Progress Notes (Signed)
Patient vomited in the middle of the night but zofran was too early to give, but patient felt better. This occurred when patient wanted to sit on the side of the bed. Patient stated that she is prescribed Phenergan suppositories that helps with her Gastroparesis that she states she has. This morning patient requested for zofran and was given per PRN order for nausea that she was having prior to morning medications. Was effective and was able to take medications without vomiting. Sticky note inserted for doctors to address when making their rounds. Will continue to monitor to end of shift.

## 2013-03-06 NOTE — Progress Notes (Signed)
03/06/13 1100 In to obtain [facility] choice from pt., and she chose [facility] in [place], [place].  TC to [employer] [phone]), and spoke with Lafonda Mosses to give referral for [facility] RN, and [facility] PT.  Faxed facesheet, history and physical, HH orders, and progress notes to [phone]).  Pt. to dc home today with spouse.   Tera Mater, RN, BSN NCM [phone]

## 2013-03-06 NOTE — Discharge Summary (Signed)
Advanced Heart Failure Team  Discharge Summary   Patient ID: Gail Chapman MRN: [id number], DOB/AGE: 55/01/1956 55 y.o. Admit date: 02/19/2013 D/C date:     03/06/2013   Primary Discharge Diagnoses:   1. Acute/chronic renal failure: Hemodialysis initiated this admission 2. Acute on chronic diastolic chf with anasarca  3. Hypertensive crisis  4. Acute respiratory failure- resolved  5. Acute on chronic diastolic heart failure  5. DM  6. Large pericardial effusion - s/p pericardial window 5/21  7. Protein calorie malnutrition (albumin 2.3)  8. LVH with urinary light chains         -Fat pad biopsy and open myocardial biopsy negative for amyloid 9. Deconditioning   Hospital Course:   Gail Chapman is a very pleasant 57 y/o woman from [place] with a h/o severe HTN, chronic renal failure (baeline Cr 2.5-3.0) and diastolic heart failure. She has been struggling with volume overload and diastolic HF. She presented to [facility] on 02/19/2013 with decompensated HF in the setting of severe HTN with a BP of 260/130. She was markedly volume overload.  On admission she was placed on a cardene drip and IV lasix gtt. She diuresed sluggishly and was eventually switch to ultrafiltration. However this too was minimally successful at volume removal. An echocardiogram was performed which showed normal LV function with severe LVH with an unusual myocardial enhancement pattern and a large pericardial effusion without tamponade. Based on her echo, the possibility of amyloid was raised. UPEP showed polyclonal free light chains. A fat pad  Biopsy was performed which was negative for amyloid but this was limited by a poor sample.  Gail Chapman's renal function began to worsen and she became increasingly somnolent. She was intubated due to respiratory failure and inability to protect her airway. Hemodialysis was initiated and 50 pounds of fluid was removed. She became oliguric and dialysis dependent. She was taken to the  OR for a pericardial window and myocardial biopsy on 5/21. Biopsy was negative for amyloid.   She was eventually weaned off the carden drip and her BP was controlled with amlodipine, carvedilol, hydralazine and doxazosin. She was unable to take clonidine due to previous intolerance. She was seen by PT for deconditioning and HHPT was recommended.  Outpatient dialysis was arranged in [place]. Discharge weight was 164 pounds.  She will follow up at [facility] 03/07/13 at 10:00 am to sign paper work. She will begin dialysis 03/08/13 at 12:00 followed by every Tuesday, Thursday, Saturday. [facility] Contact Information : [phone].   Discharge Weight Range:  Discharge Vitals: Blood pressure 147/50, pulse 76, temperature 97.5 F (36.4 C), temperature source Oral, resp. rate 20, height 5\' 5"  (1.651 m), weight 164 lb 10.9 oz (74.7 kg), SpO2 98.00%.  Labs: Lab Results  Component Value Date   WBC 17.1* 03/05/2013   HGB 8.6* 03/05/2013   HCT 27.2* 03/05/2013   MCV 88.0 03/05/2013   PLT 240 03/05/2013    Recent Labs Lab 03/05/13 0722  NA 135  K 4.3  CL 100  CO2 27  BUN 33*  CREATININE 2.59*  CALCIUM 8.5  GLUCOSE 179*   No results found for this basename: CHOL, HDL, LDLCALC, TRIG   BNP (last 3 results)  Recent Labs  02/19/13 1429 02/20/13 0500  PROBNP 45515.0* 57495.0*    Discharge Medications     Medication List    STOP taking these medications       furosemide 40 MG tablet  Commonly known as:  LASIX  labetalol 200 MG tablet  Commonly known as:  NORMODYNE     NIFEdipine 30 MG 24 hr tablet  Commonly known as:  PROCARDIA-XL/ADALAT-CC/NIFEDICAL-XL      TAKE these medications       amLODipine 10 MG tablet  Commonly known as:  NORVASC  Take 1 tablet (10 mg total) by mouth daily.     aspirin 325 MG EC tablet  Take 1 tablet (325 mg total) by mouth daily.     atorvastatin 40 MG tablet  Commonly known as:  LIPITOR  Take 40 mg by mouth  daily.     carvedilol 12.5 MG tablet  Commonly known as:  COREG  Take 1 tablet (12.5 mg total) by mouth 2 (two) times daily with a meal.     docusate sodium 100 MG capsule  Commonly known as:  COLACE  Take 100 mg by mouth 2 (two) times daily.     doxazosin 2 MG tablet  Commonly known as:  CARDURA  Take 1 tablet (2 mg total) by mouth daily.     hydrALAZINE 100 MG tablet  Commonly known as:  APRESOLINE  Take 1 tablet (100 mg total) by mouth every 8 (eight) hours.     insulin aspart 100 UNIT/ML injection  Commonly known as:  novoLOG  Inject 4 Units into the skin 3 (three) times daily with meals. Per sliding scale     insulin detemir 100 UNIT/ML injection  Commonly known as:  LEVEMIR  Inject 14 Units into the skin at bedtime. Hold if CBG<150     pantoprazole 40 MG tablet  Commonly known as:  PROTONIX  Take 40 mg by mouth daily.     promethazine 25 MG suppository  Commonly known as:  PHENERGAN  Place 1 suppository (25 mg total) rectally at bedtime.        Disposition   The patient will be discharged in stable condition to home.     Discharge Orders   Future Appointments Provider Department Dept Phone   03/19/2013 11:40 AM [facility] [phone]   Future Orders Complete By Expires     (HEART FAILURE PATIENTS) Call MD:  Anytime you have any of the following symptoms: 1) 3 pound weight gain in 24 hours or 5 pounds in 1 week 2) shortness of breath, with or without a dry hacking cough 3) swelling in the hands, feet or stomach 4) if you have to sleep on extra pillows at night in order to breathe.  As directed     Care order/instruction  As directed     Comments:      Do not remover dialysis catheter    Contraindication to ACEI at discharge  As directed     Comments:      Renal failure    Diet - low sodium heart healthy  As directed     Heart Failure patients record your daily weight using the same scale at the same  time of day  As directed     Increase activity slowly  As directed       Follow-up Information   Follow up with Arvilla Meres, MD On 03/19/2013. (at 11:40)    Contact information:   [address] [place] [address] [phone]         Duration of Discharge Encounter: Greater than 35 minutes   Signed, Truman Hayward 10:05 PM

## 2013-03-06 NOTE — Progress Notes (Signed)
Patient ID: Gail Chapman, female   DOB: 1956-07-05, 57 y.o.   MRN: [id number]    Subjective:    57 y/o female with a h/o severe HTN, DM, diastolic HF and PAH. Admitted 5/14 with HTN crisis (BP 260/120) and massive volume overload. Failed high dose lasix and ultrafiltration. RHC 5/20 with markedly elevated biventricular pressures c/w restrictive physiology.   On 5/21 underwent pericaridal window for large effusion and myocardial biopsy (exclude amyloid). Also intubated for respiratory failure and perm cath placed for initiation of HD.   Fat pad biopsy negative but not really an adequate sample.   02/26/13 Myocardial biopsy- No evidence of amyloid or other infiltrative process.   Nicardipine stopped 5/24. Yesterday IMDUR increased to 60 mg bid and doxazosin added. SBP better. HD yesterday.  Weight unchanged 164 pounds. Overall weight down 49 pounds.   Complaining of nausea over night but this is chronic and relieved with phenergan suppository . Denies SOB/PND/Orthopnea  BMET pending Objective:    Vital Signs:   Temp:  [97.5 F (36.4 C)-98.3 F (36.8 C)] 97.5 F (36.4 C) (05/29 0536) Pulse Rate:  [76-90] 76 (05/29 0536) Resp:  [13-22] 20 (05/29 0536) BP: (126-194)/(50-81) 147/50 mmHg (05/29 0536) SpO2:  [96 %-99 %] 98 % (05/29 0536) Weight:  [164 lb 10.9 oz (74.7 kg)-171 lb 11.8 oz (77.9 kg)] 164 lb 10.9 oz (74.7 kg) (05/29 0536) Last BM Date: 03/05/13  Weight change: Filed Weights   03/05/13 0517 03/05/13 1208 03/06/13 0536  Weight: 164 lb 11.2 oz (74.707 kg) 171 lb 11.8 oz (77.9 kg) 164 lb 10.9 oz (74.7 kg)    Intake/Output:   Intake/Output Summary (Last 24 hours) at 03/06/13 0755 Last data filed at 03/06/13 4098  Gross per 24 hour  Intake      0 ml  Output   4401 ml  Net  -4401 ml     Physical Exam: In Bed  General: Alert chronically ill. NAD Neuro: Normal and non focal.  HEENT:  L chest perm cath L subclavian CVL Neck: Supple without bruits. JVP 7-8 cm. Carotids 2+  bilaterally without bruits  Lungs: Resp regular and unlabored, clear anteriorly on 3 liters [place] Heart: RRR no s3, + s4, 2/6 TR  And SEM Abdomen: mildly distended. nontender, BS + x 4.  Extremities: No clubbing, cyanosis. 1+ bilat lower extremities up to knees   Telemetry: SR 60-70s  Labs: Basic Metabolic Panel:  Recent Labs Lab 02/28/13 0400 03/01/13 0400 03/02/13 0800 03/03/13 1052 03/04/13 0430 03/05/13 0722  NA 135 138 135 135 138 135  K 4.0 3.3* 3.9 4.1 3.7 4.3  CL 99 102 99 99 102 100  CO2 23 24 27 27 29 27   GLUCOSE 130* 122* 107* 147* 131* 179*  BUN 36* 23 15 23 22  33*  CREATININE 2.25* 1.72* 1.64* 2.14* 1.95* 2.59*  CALCIUM 8.6 8.5 8.5 8.5 8.2* 8.5  MG 2.0 2.0  --   --   --   --   PHOS 5.2* 4.3  --  3.3  --  2.8    Liver Function Tests:  Recent Labs Lab 03/03/13 1052 03/05/13 0722  ALBUMIN 1.9* 1.9*   No results found for this basename: LIPASE, AMYLASE,  in the last 168 hours No results found for this basename: AMMONIA,  in the last 168 hours  CBC:  Recent Labs Lab 02/28/13 0400 03/01/13 0400 03/03/13 1053 03/04/13 0430 03/05/13 0722  WBC 12.1* 15.1* 11.4* 10.2 17.1*  HGB 9.0* 9.0* 8.7* 8.4*  8.6*  HCT 27.9* 27.3* 27.9* 27.1* 27.2*  MCV 84.0 84.0 87.5 88.6 88.0  PLT 211 262 283 252 240    Cardiac Enzymes: No results found for this basename: CKTOTAL, CKMB, CKMBINDEX, TROPONINI,  in the last 168 hours  BNP: BNP (last 3 results)  Recent Labs  02/19/13 1429 02/20/13 0500  PROBNP 45515.0* 57495.0*     Other results:   Imaging: No results found.   Medications:     Scheduled Medications: . amLODipine  10 mg Oral Daily  . aspirin EC  325 mg Oral Daily   Or  . aspirin  324 mg Per Tube Daily  . bisacodyl  10 mg Oral Daily   Or  . bisacodyl  10 mg Rectal Daily  . carvedilol  12.5 mg Oral BID WC  . darbepoetin (ARANESP) injection - NON-DIALYSIS  60 mcg Subcutaneous Q Fri-1800  . docusate sodium  200 mg Oral Daily  . doxazosin  2  mg Oral Daily  . hydrALAZINE  100 mg Oral Q8H  . insulin aspart  2-6 Units Subcutaneous TID AC & HS  . isosorbide mononitrate  60 mg Oral BID  . metoCLOPramide  5 mg Oral TID AC & HS  . pantoprazole  40 mg Oral Daily  . sodium chloride  3 mL Intravenous Q12H    Infusions: . lactated ringers      PRN Medications: morphine injection, ondansetron (ZOFRAN) IV, oxyCODONE, sodium chloride   Assessment:   1. Acute on chronic diastolic chf with anasarca 2. Hypertensive crisis 3. Acute respiratory failure- resolved 4. Acute/chronic renal failure:  (baseline 2.5) Now on HD 5. DM 6. Large pericardial effusion - s/p window 5/21 7. Protein calorie malnutrition (albumin 2.3) 8. Urinary light chains      -Fat pad biopsy negative but inadequate sample.  9. Deconditioning  Plan/Discussion:     Volume status ok. Approved for outpatient dialysis at [place], [place] T-Th-Sat 12 noon at [facility] dialysis center. Save L forearm. If ok with Dr Caryn Section will d/c home.   SBP improved. Continue current regimen.    Myocardial biopsy-  No evidence of amyloid or other infiltrative process.   Case manager to set up HHPT and rolling walker. She already has home oxygen 3 liters continuously and CPAP.   CLEGG,AMY NP-C 03/06/2013 7:55 AM  Patient seen and examined with Tonye Becket, NP. We discussed all aspects of the encounter. I agree with the assessment and plan as stated above.   Doing very well. BP improved. Volume status much improved. HD set up in [place]. Will d/c home today. HD in [place] per Renal team.  Truman Hayward 8:59 AM

## 2013-03-06 NOTE — Progress Notes (Signed)
[facility]  Subjective:  Awake, alert, husband at bedside, ready to drive her home. She says she's breathing much better.  Dialyzed yetserday   Objective: Vital signs in last 24 hours: Blood pressure 147/50, pulse 76, temperature 97.5 F (36.4 C), temperature source Oral, resp. rate 20, height 5\' 5"  (1.651 m), weight 74.7 kg (164 lb 10.9 oz), SpO2 98.00%.    PHYSICAL EXAM General--awake, alert  Chest--L IJ TDC, pericardial window op site ok, clear to ausc  Heart--no rub  Abd--nontender  Extr--trace pretibial edema   Lab Results:   Recent Labs Lab 03/01/13 0400  03/03/13 1052 03/04/13 0430 03/05/13 0722 03/06/13 0955  NA 138  < > 135 138 135 135  K 3.3*  < > 4.1 3.7 4.3 3.8  CL 102  < > 99 102 100 100  CO2 24  < > 27 29 27 25   BUN 23  < > 23 22 33* 21  CREATININE 1.72*  < > 2.14* 1.95* 2.59* 1.97*  GLUCOSE 122*  < > 147* 131* 179* 219*  CALCIUM 8.5  < > 8.5 8.2* 8.5 8.4  PHOS 4.3  --  3.3  --  2.8  --   < > = values in this interval not displayed.   Recent Labs  03/04/13 0430 03/05/13 0722  WBC 10.2 17.1*  HGB 8.4* 8.6*  HCT 27.1* 27.2*  PLT 252 240     I have reviewed the patient's current medications. Scheduled: . amLODipine  10 mg Oral Daily  . aspirin EC  325 mg Oral Daily   Or  . aspirin  324 mg Per Tube Daily  . bisacodyl  10 mg Oral Daily   Or  . bisacodyl  10 mg Rectal Daily  . carvedilol  12.5 mg Oral BID WC  . darbepoetin (ARANESP) injection - NON-DIALYSIS  60 mcg Subcutaneous Q Fri-1800  . docusate sodium  200 mg Oral Daily  . doxazosin  2 mg Oral Daily  . hydrALAZINE  100 mg Oral Q8H  . insulin aspart  2-6 Units Subcutaneous TID AC & HS  . isosorbide mononitrate  60 mg Oral BID  . metoCLOPramide  5 mg Oral TID AC & HS  . pantoprazole  40 mg Oral Daily  . sodium chloride  3 mL Intravenous Q12H     Assessment/Plan: 1. End Stage Renal disease superimposed on prior chronic kidney disease: Weight has decreased from  96.6 kg on 14 May to 74.7 kg kg today. She still has pretib edema and high BP. HD was done yesterdayl. HD in [place] is arranged for T-Th-Sat at [facility] 12 [address]. Save L forearm for vascular access. 24 hr urine for protein and Cr were sent earlier today--3.126 g protein/24hr and Urine Cr 373 mg/24 hr . PTH 99 on 21 May.  She will stop by dialysis center in [place] today on her way home.    2. Diastolic dysfunction/congestive heart failure: Continue dialysis for volume management-still remains volume overloaded and getting closer to being euvolemic.  3. Anemia: she was given intravenous iron (1020 mg feraheme on 22 May), currently on Aranesp. HGB 9 on 24 May---8.6 yesterday  4. nausea/vomiting/history of gastroparesis: better with reglan 5 mg po QID  5. Hypertension: On po hydralazine, amlodipine, metoprolol, and isordil  6. Pericardial window: done on 28 Feb 2013. Myocardial bx neg for amyloidosis    LOS: 15 days   Tajia Szeliga F 03/06/2013,11:24 AM   .labalb

## 2013-03-06 NOTE — Progress Notes (Signed)
Physical Therapy Treatment Patient Details Name: Gail Chapman MRN: [id number] DOB: 06/20/1956 Today's Date: 03/06/2013 Time: 1010-1038 PT Time Calculation (min): 28 min  PT Assessment / Plan / Recommendation Comments on Treatment Session  Pt is potentially d/c home today with her husband. Reinforced to pt/spouse that she needs assistance when she attempts to mobilize. Pt demonstrated B LE weakness and decreased motor control. Pt had 1 LOB requiring therapist to prevent a fall. Pt educated on HEP and recommend HHPT. Pt reports she has a SW at home. Recommend ordering a RW for increased safety and energy conservation.     Follow Up Recommendations  Home health PT;Supervision/Assistance - 24 hour     Does the patient have the potential to tolerate intense rehabilitation     Barriers to Discharge        Equipment Recommendations  Rolling walker with 5" wheels    Recommendations for Other Services    Frequency     Plan Discharge plan remains appropriate    Precautions / Restrictions Precautions Precautions: Fall Precaution Comments: decreased balance and muscle control   Pertinent Vitals/Pain     Mobility  Bed Mobility Bed Mobility: Not assessed Transfers Transfers: Sit to Stand;Stand to Sit Sit to Stand: 4: Min assist;With armrests;From chair/3-in-1 Stand to Sit: 4: Min assist;With armrests;To chair/3-in-1 Details for Transfer Assistance: cues for increasing forward trunk flexion and hand placement Ambulation/Gait Ambulation/Gait Assistance: 4: Min assist Ambulation Distance (Feet): 70 Feet Assistive device: Rolling walker Ambulation/Gait Assistance Details: decreased LE motor control, 1 LOB Gait velocity: decreased    Exercises General Exercises - Lower Extremity Ankle Circles/Pumps: AROM;Strengthening;Both;10 reps;Seated Quad Sets: AROM;Strengthening;Both;10 reps;Seated Long Arc Quad: AROM;Strengthening;Both;10 reps;Seated Hip ABduction/ADduction:  Strengthening;AROM;Both;10 reps;Seated Straight Leg Raises: AAROM;Strengthening;Both;10 reps;Seated Hip Flexion/Marching: AROM;Strengthening;Both;10 reps;Seated   PT Diagnosis:    PT Problem List:   PT Treatment Interventions:     PT Goals Acute Rehab PT Goals PT Goal: Sit to Stand - Progress: Progressing toward goal PT Goal: Ambulate - Progress: Progressing toward goal  Visit Information  Last PT Received On: 03/06/13 Assistance Needed: +1    Subjective Data  Subjective: I feel much stronger today.   Cognition  Cognition Arousal/Alertness: Awake/alert Behavior During Therapy: WFL for tasks assessed/performed Overall Cognitive Status: Within Functional Limits for tasks assessed    Balance  Static Standing Balance Static Standing - Balance Support: Bilateral upper extremity supported Static Standing - Level of Assistance: 5: Stand by assistance  End of Session PT - End of Session Equipment Utilized During Treatment: Gait belt;Oxygen Activity Tolerance: Patient limited by fatigue Patient left: in chair;with call bell/phone within reach;with family/visitor present Nurse Communication: Mobility status   GP     Greggory Stallion 03/06/2013, 10:57 AM

## 2013-03-11 ENCOUNTER — Encounter (HOSPITAL_COMMUNITY): Payer: Self-pay

## 2013-03-11 ENCOUNTER — Encounter (HOSPITAL_COMMUNITY): Payer: Self-pay | Admitting: Internal Medicine

## 2013-03-11 ENCOUNTER — Ambulatory Visit (HOSPITAL_COMMUNITY)
Admission: RE | Admit: 2013-03-11 | Discharge: 2013-03-11 | Disposition: A | Discharge status: Home / Self Care | Payer: BC Managed Care – PPO | Source: Ambulatory Visit | Attending: Internal Medicine | Admitting: Internal Medicine

## 2013-03-11 VITALS — BP 164/76 | HR 75 | Wt 170.4 lb

## 2013-03-11 DIAGNOSIS — I10 Essential (primary) hypertension: Secondary | ICD-10-CM

## 2013-03-11 DIAGNOSIS — I509 Heart failure, unspecified: Secondary | ICD-10-CM

## 2013-03-11 DIAGNOSIS — I5032 Chronic diastolic (congestive) heart failure: Secondary | ICD-10-CM

## 2013-03-11 DIAGNOSIS — I5030 Unspecified diastolic (congestive) heart failure: Secondary | ICD-10-CM | POA: Insufficient documentation

## 2013-03-11 DIAGNOSIS — N186 End stage renal disease: Secondary | ICD-10-CM | POA: Insufficient documentation

## 2013-03-11 DIAGNOSIS — E119 Type 2 diabetes mellitus without complications: Secondary | ICD-10-CM | POA: Insufficient documentation

## 2013-03-11 DIAGNOSIS — I120 Hypertensive chronic kidney disease with stage 5 chronic kidney disease or end stage renal disease: Secondary | ICD-10-CM | POA: Insufficient documentation

## 2013-03-11 NOTE — Patient Instructions (Addendum)
Follow up in 3-4 months

## 2013-03-17 NOTE — Assessment & Plan Note (Signed)
Remains markedly volume overload but volume status now controlled by HD. Suggested she may need HD on consecutive days for a while. Asked her to discuss lowering her dry weight with her nephrologist.

## 2013-03-17 NOTE — Progress Notes (Signed)
Patient ID: Gail Chapman, female   DOB: 05-22-1956, 57 y.o.   MRN: [id number]  Weight Range   Baseline proBNP    [facility]  HPI: Gail Chapman is a very pleasant 57 y/o woman from [place] with a h/o severe HTN, diastolic heart failure and ESRD.   She presented to [facility] on 02/19/2013 with decompensated HF and marked volume overload in the setting of severe HTN with a BP of 260/130. Her hospitalization was complicated by progressive renal failure, VDRF and a large pericardial effusion requiring pericardial window. She also underwent fat pad and myocardial biopsy to exclude amyloid. Discharged 03/06/13 with HD to start 5/31 at [facility] Tuesday/Thursday, and Saturday.   She returns for post hospital follow up. Complains of fatigue. Denies SOB/PND/Orthopnea. Ambulating in the house with a walker. Compliant with medications. She continues on HD at [facility] Tuesday, Thursday, Saturday. Still volume overloaded. Followed by [facility]. PT to start soon.     ROS: All systems negative except as listed in HPI, PMH and Problem List.  Past Medical History  Diagnosis Date  . Diabetes mellitus without complication   . Chronic diastolic CHF (congestive heart failure)     a. 01/2007 MV: No isch/infarct, nl EF;  b. 2012 Cath: "normal" per patient.  Performed in [place], [place] by Dr. Graciela Husbands.  . Pulmonary hypertension     a. on home O2 @ 3lpm 24hrs/day  . HTN (hypertension)     a. Dx in 1993.  Marland Kitchen LVH (left ventricular hypertrophy)   . OSA (obstructive sleep apnea)     a. uses CPAP  . Physical deconditioning     Current Outpatient Prescriptions  Medication Sig Dispense Refill  . amLODipine (NORVASC) 10 MG tablet Take 1 tablet (10 mg total) by mouth daily.  30 tablet  3  . aspirin EC 325 MG EC tablet Take 1 tablet (325 mg total) by mouth daily.  30 tablet  3  . atorvastatin (LIPITOR) 40 MG tablet Take 40 mg by mouth daily.      . carvedilol (COREG)  12.5 MG tablet Take 1 tablet (12.5 mg total) by mouth 2 (two) times daily with a meal.  60 tablet  3  . docusate sodium (COLACE) 100 MG capsule Take 100 mg by mouth 2 (two) times daily.      Marland Kitchen doxazosin (CARDURA) 2 MG tablet Take 1 tablet (2 mg total) by mouth daily.  30 tablet  3  . hydrALAZINE (APRESOLINE) 100 MG tablet Take 1 tablet (100 mg total) by mouth every 8 (eight) hours.  90 tablet  6  . insulin aspart (NOVOLOG) 100 UNIT/ML injection Inject 4 Units into the skin 3 (three) times daily with meals. Per sliding scale      . insulin detemir (LEVEMIR) 100 UNIT/ML injection Inject 14 Units into the skin at bedtime. Hold if CBG<150      . pantoprazole (PROTONIX) 40 MG tablet Take 40 mg by mouth daily.      . promethazine (PHENERGAN) 25 MG suppository Place 1 suppository (25 mg total) rectally at bedtime.  12 each  3   No current facility-administered medications for this encounter.     PHYSICAL EXAM: Filed Vitals:   03/11/13 1024  BP: 164/76  Pulse: 75  Weight: 170 lb 6.4 oz (77.293 kg)  SpO2: 88%    General:  Chronically ill appearing. No resp difficulty HEENT: normal Neck: supple. JVP to jaw. Carotids 2+ bilaterally; no bruits. No  lymphadenopathy or thryomegaly appreciated. Cor: PMI normal. Regular rate & rhythm. +s4/ 2/6 TR Lungs: Clear. Decreased at bases Abdomen: soft, obese. nontender, distended. No hepatosplenomegaly. No bruits or masses. Good bowel sounds. Extremities: no cyanosis, clubbing, rash, R and LLE 2-3+ edema Neuro: alert & orientedx3, cranial nerves grossly intact. Moves all 4 extremities w/o difficulty. Affect pleasant.      ASSESSMENT & PLAN:

## 2013-03-17 NOTE — Assessment & Plan Note (Addendum)
BP improved but still elevated. Suspect it will improve with volume removal. Will be followed by Dialysis Center. Can titrate doxazosin as needed. Does not tolerate clonidine.

## 2013-03-19 ENCOUNTER — Encounter (HOSPITAL_COMMUNITY): Payer: BC Managed Care – PPO

## 2013-03-28 DIAGNOSIS — R0989 Other specified symptoms and signs involving the circulatory and respiratory systems: Secondary | ICD-10-CM

## 2013-04-10 ENCOUNTER — Emergency Department (HOSPITAL_COMMUNITY)
Admission: EM | Admit: 2013-04-10 | Discharge: 2013-04-10 | Disposition: A | Discharge status: Home / Self Care | Payer: BC Managed Care – PPO | Attending: Emergency Medicine | Admitting: Emergency Medicine

## 2013-04-10 ENCOUNTER — Encounter (HOSPITAL_COMMUNITY): Payer: Self-pay | Admitting: *Deleted

## 2013-04-10 ENCOUNTER — Emergency Department (HOSPITAL_COMMUNITY): Payer: BC Managed Care – PPO

## 2013-04-10 DIAGNOSIS — Z8679 Personal history of other diseases of the circulatory system: Secondary | ICD-10-CM | POA: Insufficient documentation

## 2013-04-10 DIAGNOSIS — Z87891 Personal history of nicotine dependence: Secondary | ICD-10-CM | POA: Insufficient documentation

## 2013-04-10 DIAGNOSIS — Z9981 Dependence on supplemental oxygen: Secondary | ICD-10-CM | POA: Insufficient documentation

## 2013-04-10 DIAGNOSIS — Z7982 Long term (current) use of aspirin: Secondary | ICD-10-CM | POA: Insufficient documentation

## 2013-04-10 DIAGNOSIS — I2789 Other specified pulmonary heart diseases: Secondary | ICD-10-CM | POA: Insufficient documentation

## 2013-04-10 DIAGNOSIS — R5383 Other fatigue: Secondary | ICD-10-CM

## 2013-04-10 DIAGNOSIS — J159 Unspecified bacterial pneumonia: Secondary | ICD-10-CM | POA: Insufficient documentation

## 2013-04-10 DIAGNOSIS — R609 Edema, unspecified: Secondary | ICD-10-CM | POA: Insufficient documentation

## 2013-04-10 DIAGNOSIS — J189 Pneumonia, unspecified organism: Secondary | ICD-10-CM

## 2013-04-10 DIAGNOSIS — R112 Nausea with vomiting, unspecified: Secondary | ICD-10-CM | POA: Insufficient documentation

## 2013-04-10 DIAGNOSIS — I129 Hypertensive chronic kidney disease with stage 1 through stage 4 chronic kidney disease, or unspecified chronic kidney disease: Secondary | ICD-10-CM | POA: Insufficient documentation

## 2013-04-10 DIAGNOSIS — E1129 Type 2 diabetes mellitus with other diabetic kidney complication: Secondary | ICD-10-CM | POA: Insufficient documentation

## 2013-04-10 DIAGNOSIS — G4733 Obstructive sleep apnea (adult) (pediatric): Secondary | ICD-10-CM | POA: Insufficient documentation

## 2013-04-10 DIAGNOSIS — R197 Diarrhea, unspecified: Secondary | ICD-10-CM | POA: Insufficient documentation

## 2013-04-10 DIAGNOSIS — Z79899 Other long term (current) drug therapy: Secondary | ICD-10-CM | POA: Insufficient documentation

## 2013-04-10 DIAGNOSIS — Z794 Long term (current) use of insulin: Secondary | ICD-10-CM | POA: Insufficient documentation

## 2013-04-10 DIAGNOSIS — N289 Disorder of kidney and ureter, unspecified: Secondary | ICD-10-CM

## 2013-04-10 DIAGNOSIS — I5032 Chronic diastolic (congestive) heart failure: Secondary | ICD-10-CM | POA: Insufficient documentation

## 2013-04-10 HISTORY — DX: Disorder of kidney and ureter, unspecified: N28.9

## 2013-04-10 LAB — CBC
MCHC: 32.5 g/dL (ref 30.0–36.0)
Platelets: 227 10*3/uL (ref 150–400)
RDW: 18 % — ABNORMAL HIGH (ref 11.5–15.5)
WBC: 6.4 10*3/uL (ref 4.0–10.5)

## 2013-04-10 LAB — URINALYSIS, ROUTINE W REFLEX MICROSCOPIC
Glucose, UA: 250 mg/dL — AB
Leukocytes, UA: NEGATIVE
Protein, ur: >300 mg/dL — AB
pH: 6.5 (ref 5.0–8.0)

## 2013-04-10 LAB — BASIC METABOLIC PANEL
Chloride: 102 mEq/L (ref 96–112)
GFR calc Af Amer: 27 mL/min — ABNORMAL LOW (ref >90)
GFR calc non Af Amer: 23 mL/min — ABNORMAL LOW (ref >90)
Potassium: 3.2 mEq/L — ABNORMAL LOW (ref 3.5–5.1)

## 2013-04-10 LAB — URINE MICROSCOPIC-ADD ON

## 2013-04-10 LAB — TYPE AND SCREEN: ABO/RH(D): A POS

## 2013-04-10 LAB — PRO B NATRIURETIC PEPTIDE: Pro B Natriuretic peptide (BNP): 19621 pg/mL — ABNORMAL HIGH (ref 0–125)

## 2013-04-10 MED ORDER — LEVOFLOXACIN 500 MG PO TABS
500.0000 mg | ORAL_TABLET | Freq: Once | ORAL | Status: AC
  Administered 2013-04-10: 500 mg via ORAL
  Filled 2013-04-10: qty 1

## 2013-04-10 MED ORDER — LEVOFLOXACIN 250 MG PO TABS: 250.0000 mg | ORAL_TABLET | Freq: Every day | ORAL | Status: DC

## 2013-04-10 NOTE — ED Notes (Signed)
Pt transported to XR.  

## 2013-04-10 NOTE — ED Notes (Signed)
Pt returned from XR, placed back on monitor. 

## 2013-04-10 NOTE — ED Notes (Signed)
Reports last dialysis was tues and she feels bad after each treatment with n/v and fatigue, did not go to dialysis today. Reports they called her yesterday and told her that she needed to come to ed for hgb 7.8, usually goes to [facility].

## 2013-04-10 NOTE — ED Provider Notes (Signed)
History    CSN: [id number] Arrival date & time 04/10/13  1814  First MD Initiated Contact with Patient 04/10/13 1858     Chief Complaint  Patient presents with  . Abnormal Lab  . Fatigue   (Consider location/radiation/quality/duration/timing/severity/associated sxs/prior Treatment) The history is provided by the patient and medical records.   Patient presents to the ED for generalized fatigue. Patient states she is currently on hemodialysis for CHF and always feels terrible after her txs--  nausea, vomiting, diarrhea, and fatigue but this is worse than normal.  Schedule is Tuesday, Thursday, Saturday at [place]. Last treatment on Tuesday, she did finish the entire treatment.  Patient notes she did not go to treatment today. She was contacted by the dialysis center and told that she needed to come to the ED for evaluation of low hemoglobin at 7.8. Pt states it has been running low for the past month.  No chest pain, SOB, dizziness, weakness, confusion, tinnitus, or changes in speech.  No cough, fevers, sweats, or chills.  Past Medical History  Diagnosis Date  . Diabetes mellitus without complication   . Chronic diastolic CHF (congestive heart failure)     a. 01/2007 MV: No isch/infarct, nl EF;  b. 2012 Cath: "normal" per patient.  Performed in [place], [place] by Dr. Graciela Husbands.  . Pulmonary hypertension     a. on home O2 @ 3lpm 24hrs/day  . HTN (hypertension)     a. Dx in 1993.  Marland Kitchen LVH (left ventricular hypertrophy)   . OSA (obstructive sleep apnea)     a. uses CPAP  . Physical deconditioning   . Renal disorder    Past Surgical History  Procedure Laterality Date  . Knee surgery    . Carpal tunnel release    . Tubal ligation    . Cholecystectomy    . Mass excision      a. under axilla.  . Subxyphoid pericardial window N/A 02/26/2013    Procedure: SUBXYPHOID PERICARDIAL WINDOW;  Surgeon: Delight Ovens, MD;  Location: [facility] OR;  Service: Thoracic;  Laterality: N/A;   Family History   Problem Relation Age of Onset  . Diabetes Mother   . CAD Mother    History  Substance Use Topics  . Smoking status: Former Games developer  . Smokeless tobacco: Not on file  . Alcohol Use: No   OB History   Grav Para Term Preterm Abortions TAB SAB Ect Mult Living                 Review of Systems  Constitutional: Positive for fatigue.  Gastrointestinal: Positive for nausea, vomiting and diarrhea.  All other systems reviewed and are negative.    Allergies  Clonidine derivatives; Labetalol; and Vancomycin  Home Medications   Current Outpatient Rx  Name  Route  Sig  Dispense  Refill  . amLODipine (NORVASC) 10 MG tablet   Oral   Take 1 tablet (10 mg total) by mouth daily.   30 tablet   3   . aspirin EC 325 MG EC tablet   Oral   Take 1 tablet (325 mg total) by mouth daily.   30 tablet   3   . atorvastatin (LIPITOR) 40 MG tablet   Oral   Take 40 mg by mouth daily.         . carvedilol (COREG) 12.5 MG tablet   Oral   Take 1 tablet (12.5 mg total) by mouth 2 (two) times daily with a meal.  60 tablet   3   . docusate sodium (COLACE) 100 MG capsule   Oral   Take 100 mg by mouth 2 (two) times daily.         Marland Kitchen doxazosin (CARDURA) 2 MG tablet   Oral   Take 1 tablet (2 mg total) by mouth daily.   30 tablet   3   . hydrALAZINE (APRESOLINE) 100 MG tablet   Oral   Take 1 tablet (100 mg total) by mouth every 8 (eight) hours.   90 tablet   6   . insulin aspart (NOVOLOG) 100 UNIT/ML injection   Subcutaneous   Inject 4 Units into the skin 3 (three) times daily with meals. Per sliding scale         . insulin detemir (LEVEMIR) 100 UNIT/ML injection   Subcutaneous   Inject 14 Units into the skin at bedtime. Hold if CBG<150         . pantoprazole (PROTONIX) 40 MG tablet   Oral   Take 40 mg by mouth daily.         . promethazine (PHENERGAN) 25 MG suppository   Rectal   Place 1 suppository (25 mg total) rectally at bedtime.   12 each   3    BP 177/68   Pulse 69  Temp(Src) 98.6 F (37 C) (Oral)  Resp 18  SpO2 96%  Physical Exam  Nursing note and vitals reviewed. Constitutional: She is oriented to person, place, and time. She appears well-developed and well-nourished.  HENT:  Head: Normocephalic and atraumatic.  Mouth/Throat: Oropharynx is clear and moist.  Eyes: Conjunctivae and EOM are normal. Pupils are equal, round, and reactive to light.  Neck: Normal range of motion. Neck supple.  Cardiovascular: Normal rate, regular rhythm and normal heart sounds.   Pulmonary/Chest: Effort normal and breath sounds normal. No respiratory distress.  Crackles at bilateral bases; perma-cath in place- left chest  Abdominal: Soft. Bowel sounds are normal. There is no tenderness. There is no guarding.  Musculoskeletal: Normal range of motion. She exhibits edema.  1+ pitting edema bilaterally  Neurological: She is alert and oriented to person, place, and time. She has normal strength. No cranial nerve deficit or sensory deficit.  CN grossly intact, moves all extremities appropriately, no focal neuro deficits or facial droop appreciated, normal gait unassisted  Skin: Skin is warm and dry.  Psychiatric: She has a normal mood and affect.    ED Course  Procedures (including critical care time)   Date: 04/10/2013  Rate: 68  Rhythm: normal sinus rhythm  QRS Axis: normal  Intervals: normal  ST/T Wave abnormalities: nonspecific T wave changes  Conduction Disutrbances:none  Narrative Interpretation:   Old EKG Reviewed: unchanged   Labs Reviewed  CBC - Abnormal; Notable for the following:    RBC 3.12 (*)    Hemoglobin 8.8 (*)    HCT 27.1 (*)    RDW 18.0 (*)    All other components within normal limits  BASIC METABOLIC PANEL - Abnormal; Notable for the following:    Potassium 3.2 (*)    Glucose, Bld 151 (*)    BUN 48 (*)    Creatinine, Ser 2.25 (*)    GFR calc non Af Amer 23 (*)    GFR calc Af Amer 27 (*)    All other components within  normal limits  PRO B NATRIURETIC PEPTIDE - Abnormal; Notable for the following:    Pro B Natriuretic peptide (BNP) 19621.0 (*)  All other components within normal limits  URINALYSIS, ROUTINE W REFLEX MICROSCOPIC - Abnormal; Notable for the following:    Glucose, UA 250 (*)    Hgb urine dipstick TRACE (*)    Protein, ur >300 (*)    All other components within normal limits  URINE MICROSCOPIC-ADD ON - Abnormal; Notable for the following:    Squamous Epithelial / LPF FEW (*)    Bacteria, UA FEW (*)    All other components within normal limits  POCT I-STAT TROPONIN I  TYPE AND SCREEN   Dg Chest 2 View  04/10/2013   *RADIOLOGY REPORT*  Clinical Data: Weakness, fatigue  CHEST - 2 VIEW  Comparison: 03/01/2013  Findings: Right middle lobe opacity, suspicious for pneumonia.  Pulmonary vascular congestion without without frank interstitial edema.  Suspected small bilateral pleural effusions. No pneumothorax.  Cardiomegaly.  Stable left IJ dual lumen dialysis catheter.  IMPRESSION: Right lower lobe opacity, suspicious for pneumonia.  Pulmonary vascular congestion without frank interstitial edema.  Suspected small bilateral pleural effusions.   Original Report Authenticated By: Charline Bills, M.D.   1. CAP (community acquired pneumonia)   2. Fatigue   3. Renal impairment   4. Chronic diastolic CHF (congestive heart failure)     MDM   EKG NSR, no acute ischemic changes.  Trop negative.  H/H stable 8.8/27/1.  SrCr elevated at 2.25 (slight bump from 1.97 on 5/29).  Old labs reviewed-- pts Cr fluctuates between 1.5- 2.5.  BNP elevated, but improved from previous.  CXR with continual pulmonary vascular congestion with new RLL opacity suspicion for pneumonia-- pt currently without SOB, cough, or leukocytosis.  Pt afebrile, non-toxic appearing, NAD, VS stable-- ok for d/c.  Instructed to keep her upcoming dialysis appts and FU with her PCP.  Rx levaquin- 1st dose given in the ED.  Discussed plan with pt,  she agreed.  Return precautions advised.  Discussed with Dr. Effie Shy who agrees with plan.      Garlon Hatchet, PA-C 04/10/13 2349

## 2013-04-11 NOTE — ED Provider Notes (Signed)
Medical screening examination/treatment/procedure(s) were performed by non-physician practitioner and as supervising physician I was immediately available for consultation/collaboration.  Flint Melter, MD 04/11/13 Marlyne Beards

## 2013-06-11 ENCOUNTER — Ambulatory Visit (HOSPITAL_COMMUNITY)
Admission: RE | Admit: 2013-06-11 | Discharge: 2013-06-11 | Disposition: A | Discharge status: Home / Self Care | Payer: BC Managed Care – PPO | Source: Ambulatory Visit | Attending: Internal Medicine | Admitting: Internal Medicine

## 2013-06-11 VITALS — BP 178/84 | HR 74 | Ht 61.0 in | Wt 152.8 lb

## 2013-06-11 DIAGNOSIS — G4733 Obstructive sleep apnea (adult) (pediatric): Secondary | ICD-10-CM | POA: Insufficient documentation

## 2013-06-11 DIAGNOSIS — I120 Hypertensive chronic kidney disease with stage 5 chronic kidney disease or end stage renal disease: Secondary | ICD-10-CM | POA: Insufficient documentation

## 2013-06-11 DIAGNOSIS — E119 Type 2 diabetes mellitus without complications: Secondary | ICD-10-CM | POA: Insufficient documentation

## 2013-06-11 DIAGNOSIS — Z79899 Other long term (current) drug therapy: Secondary | ICD-10-CM | POA: Insufficient documentation

## 2013-06-11 DIAGNOSIS — N179 Acute kidney failure, unspecified: Secondary | ICD-10-CM

## 2013-06-11 DIAGNOSIS — I5032 Chronic diastolic (congestive) heart failure: Secondary | ICD-10-CM | POA: Insufficient documentation

## 2013-06-11 DIAGNOSIS — Z01810 Encounter for preprocedural cardiovascular examination: Secondary | ICD-10-CM | POA: Insufficient documentation

## 2013-06-11 DIAGNOSIS — N186 End stage renal disease: Secondary | ICD-10-CM | POA: Insufficient documentation

## 2013-06-11 DIAGNOSIS — I509 Heart failure, unspecified: Secondary | ICD-10-CM | POA: Insufficient documentation

## 2013-06-11 DIAGNOSIS — N189 Chronic kidney disease, unspecified: Secondary | ICD-10-CM

## 2013-06-11 DIAGNOSIS — I10 Essential (primary) hypertension: Secondary | ICD-10-CM

## 2013-06-11 DIAGNOSIS — Z794 Long term (current) use of insulin: Secondary | ICD-10-CM | POA: Insufficient documentation

## 2013-06-11 MED ORDER — DOXAZOSIN MESYLATE 2 MG PO TABS: 2.0000 mg | ORAL_TABLET | Freq: Every day | ORAL | Status: DC

## 2013-06-11 NOTE — Progress Notes (Signed)
Patient ID: Gail Chapman, female   DOB: [birth year], 57 y.o.   MRN: [id number]   Weight Range   Baseline proBNP    [facility]  HPI: Gail Chapman is a very pleasant 57 y/o woman from [place] with a h/o severe HTN, diastolic heart failure and ESRD.   She presented to [facility] on 02/19/2013 with decompensated HF and marked volume overload in the setting of severe HTN with a BP of 260/130. Her hospitalization was complicated by progressive renal failure, VDRF and a large pericardial effusion requiring pericardial window. She also underwent fat pad and myocardial biopsy to exclude amyloid. Discharged 03/06/13 with HD to start 5/31 at [facility] Tuesday/Thursday, and Saturday.   She returns for follow up. Requesting pre-op clearance for AV fistula. Feels better but still having problems getting around. Feels legs are weak. Walks with a cane or walker. Told in the past she may have MS. Also having problems with her vision - scheduled to see ophtho. Denies SOB/PND/Orthopnea. Ambulating in the house with a walker. Compliant with medications. She continues on HD at [facility] Tuesday, Thursday, Saturday. Followed by [facility]. PT to start soon. Still makes urine about 3 times a day "a good amount". Says current dialysis is to maintain her fluid and not to clear toxins. Cr now 1.7. Asking if she can switch 2x week.   BP remains high. Unable to take clonidine (itching) and hydralazine (photophobia). Nephrologist started spiro. Over last few days BP running close to 200.   Had RHC in may but no coronary angio. Myoview 2008 with no ischemia.     ROS: All systems negative except as listed in HPI, PMH and Problem List.  Past Medical History  Diagnosis Date  . Diabetes mellitus without complication   . Chronic diastolic CHF (congestive heart failure)     a. 01/2007 MV: No isch/infarct, nl EF;  b. 2012 Cath: "normal" per patient.  Performed in [place], [place] by  Dr. Graciela Husbands.  . Pulmonary hypertension     a. on home O2 @ 3lpm 24hrs/day  . HTN (hypertension)     a. Dx in 1993.  Marland Kitchen LVH (left ventricular hypertrophy)   . OSA (obstructive sleep apnea)     a. uses CPAP  . Physical deconditioning   . Renal disorder     Current Outpatient Prescriptions  Medication Sig Dispense Refill  . amLODipine (NORVASC) 10 MG tablet Take 1 tablet (10 mg total) by mouth daily.  30 tablet  3  . aspirin EC 325 MG EC tablet Take 1 tablet (325 mg total) by mouth daily.  30 tablet  3  . carvedilol (COREG) 12.5 MG tablet Take 1 tablet (12.5 mg total) by mouth 2 (two) times daily with a meal.  60 tablet  3  . insulin aspart (NOVOLOG) 100 UNIT/ML injection Inject 4 Units into the skin 3 (three) times daily with meals. Per sliding scale      . insulin detemir (LEVEMIR) 100 UNIT/ML injection Inject 14 Units into the skin at bedtime. Hold if CBG<150      . promethazine (PHENERGAN) 25 MG suppository Place 1 suppository (25 mg total) rectally at bedtime.  12 each  3  . spironolactone (ALDACTONE) 25 MG tablet Take 25 mg by mouth daily.      Marland Kitchen levofloxacin (LEVAQUIN) 250 MG tablet Take 1 tablet (250 mg total) by mouth daily.  6 tablet  0  . pantoprazole (PROTONIX) 40 MG tablet Take 40  mg by mouth daily.       No current facility-administered medications for this encounter.     PHYSICAL EXAM: Filed Vitals:   06/11/13 1101  BP: 178/84  Pulse: 74  Height: 5\' 1"  (1.549 m)  Weight: 152 lb 12.8 oz (69.31 kg)  SpO2: 95%    General:  Sitting in WC  No resp difficulty HEENT: normal Neck: supple. JVP 6-7. Carotids 2+ bilaterally; no bruits. No lymphadenopathy or thryomegaly appreciated. Cor: PMI normal. Regular rate & rhythm. +s42/6 TR  Perm cath in place in l cehst Lungs: Clear. Decreased at bases Abdomen: soft, obese. nontender, distended. No hepatosplenomegaly. No bruits or masses. Good bowel sounds. Extremities: no cyanosis, clubbing, rash, tr edema. Slightly weak in R leg   Neuro: alert & orientedx3, cranial nerves grossly intact. Moves all 4 extremities w/o difficulty. Affect pleasant    ASSESSMENT & PLAN:  1. Chronic Diastolic Heart Failure    --volume status well controlled with HD. Seemslike she has had some renal recovery but unclear if this is sufficient enough to let her cut back with HD. She will f/u with nephrology.  2. Chronic renal failure, end-stage  3. DM2  4. HTN, severe     --BP remains markedly elevated. Unable to tolerate clonidine or hydralazine due side effects. She is on spiro per Nephrology. Reports visual problems in past with Cardura but not sure if this may jus be due to diabetic retinopathy. As options are limited will retry Cardura 2mg  daily. Minoxidil also an option but with previous pericardial effusion would like to avoid.   5. Pre-op clearance     --given DM and reduced functional capacity would typically recommend a Myoview pre-op but she was able to tolerate anesthesia well for pericardial window without problems thus I think she would be fine for AV fistula surgery. Will refer [facility].   Gail Chapman 11:39 AM

## 2013-06-11 NOTE — Patient Instructions (Addendum)
Restart Cardura 2 mg daily  You have been referred to [facility]  We will contact you in 3 months to schedule your next appointment.

## 2013-07-08 ENCOUNTER — Other Ambulatory Visit: Payer: Self-pay | Admitting: Vascular Surgery

## 2013-07-08 DIAGNOSIS — N186 End stage renal disease: Secondary | ICD-10-CM

## 2013-07-09 ENCOUNTER — Other Ambulatory Visit: Payer: Self-pay | Admitting: *Deleted

## 2013-07-09 DIAGNOSIS — N186 End stage renal disease: Secondary | ICD-10-CM

## 2013-07-18 ENCOUNTER — Ambulatory Visit: Payer: BC Managed Care – PPO | Admitting: Vascular Surgery

## 2013-07-18 ENCOUNTER — Other Ambulatory Visit (HOSPITAL_COMMUNITY): Payer: BC Managed Care – PPO

## 2013-07-31 ENCOUNTER — Encounter: Payer: Self-pay | Admitting: Vascular Surgery

## 2013-08-01 ENCOUNTER — Ambulatory Visit (INDEPENDENT_AMBULATORY_CARE_PROVIDER_SITE_OTHER): Payer: BC Managed Care – PPO | Admitting: Vascular Surgery

## 2013-08-01 ENCOUNTER — Encounter (HOSPITAL_COMMUNITY): Payer: BC Managed Care – PPO

## 2013-08-01 ENCOUNTER — Encounter: Payer: Self-pay | Admitting: Vascular Surgery

## 2013-08-01 ENCOUNTER — Encounter (INDEPENDENT_AMBULATORY_CARE_PROVIDER_SITE_OTHER): Payer: Self-pay

## 2013-08-01 VITALS — BP 191/74 | HR 155 | Ht 61.0 in | Wt 152.0 lb

## 2013-08-01 DIAGNOSIS — N186 End stage renal disease: Secondary | ICD-10-CM | POA: Insufficient documentation

## 2013-08-01 NOTE — Progress Notes (Signed)
[facility] [place]  Referred by:  [facility]  Reason for referral: New access  History of Present Illness  Gail Chapman is a 57 y.o. (11/04/1955) female who presents for evaluation for permanent access.  The patient is right hand dominant.  The patient has not had previous access procedures.  Previous central venous cannulation procedures include: B TDC, most recently LIJ TDC.  The patient has never had a PPM placed.  The patient continues to urinate with intact renal function.  She is on HD for decompensated CHF.  Past Medical History  Diagnosis Date  . Diabetes mellitus without complication   . Chronic diastolic CHF (congestive heart failure)     a. 01/2007 MV: No isch/infarct, nl EF;  b. 2012 Cath: "normal" per patient.  Performed in [place], [place] by Dr. Graciela Husbands.  . Pulmonary hypertension     a. on home O2 @ 3lpm 24hrs/day  . HTN (hypertension)     a. Dx in 1993.  Marland Kitchen LVH (left ventricular hypertrophy)   . OSA (obstructive sleep apnea)     a. uses CPAP  . Physical deconditioning   . Renal disorder   . COPD (chronic obstructive pulmonary disease)     Past Surgical History  Procedure Laterality Date  . Knee surgery    . Carpal tunnel release    . Tubal ligation    . Cholecystectomy    . Mass excision      a. under axilla.  . Subxyphoid pericardial window N/A 02/26/2013    Procedure: SUBXYPHOID PERICARDIAL WINDOW;  Surgeon: Delight Ovens, MD;  Location: [facility] OR;  Service: Thoracic;  Laterality: N/A;    History   Social History  . Marital Status: Married    Spouse Name: N/A    Number of Children: N/A  . Years of Education: N/A   Occupational History  . Not on file.   Social History Main Topics  . Smoking status: Former Games developer  . Smokeless tobacco: Not on file  . Alcohol Use: No  . Drug Use: No     Comment: "smoked weed"  . Sexual Activity: Not on file   Other Topics Concern  . Not on file   Social History Narrative   Lives in  [place] with her husband.  They have 2 grown children.  She is on disability.    Family History  Problem Relation Age of Onset  . Diabetes Mother   . CAD Mother   . Cancer Mother   . Hypertension Mother   . Cancer Father   . Diabetes Father   . Hyperlipidemia Father   . Hypertension Father   . Heart attack Father    Current Outpatient Prescriptions on File Prior to Visit  Medication Sig Dispense Refill  . amLODipine (NORVASC) 10 MG tablet Take 1 tablet (10 mg total) by mouth daily.  30 tablet  3  . aspirin EC 325 MG EC tablet Take 1 tablet (325 mg total) by mouth daily.  30 tablet  3  . carvedilol (COREG) 12.5 MG tablet Take 1 tablet (12.5 mg total) by mouth 2 (two) times daily with a meal.  60 tablet  3  . doxazosin (CARDURA) 2 MG tablet Take 1 tablet (2 mg total) by mouth at bedtime.      . insulin aspart (NOVOLOG) 100 UNIT/ML injection Inject 4 Units into the skin 3 (three) times daily with meals. Per sliding scale      . insulin detemir (LEVEMIR) 100  UNIT/ML injection Inject 14 Units into the skin at bedtime. Hold if CBG<150      . levofloxacin (LEVAQUIN) 250 MG tablet Take 1 tablet (250 mg total) by mouth daily.  6 tablet  0  . pantoprazole (PROTONIX) 40 MG tablet Take 40 mg by mouth daily.      . promethazine (PHENERGAN) 25 MG suppository Place 1 suppository (25 mg total) rectally at bedtime.  12 each  3  . spironolactone (ALDACTONE) 25 MG tablet Take 25 mg by mouth daily.       No current facility-administered medications on file prior to visit.    Allergies  Allergen Reactions  . Clonidine Derivatives     Hand itching  . Hydralazine     Visual disturbances   . Labetalol     Fatigue   . Vancomycin Rash    "shut down my kidneys"     REVIEW OF SYSTEMS:  (Positives checked otherwise negative)  CARDIOVASCULAR:  []  chest pain, []  chest pressure, []  palpitations, [x]  shortness of breath when laying flat, []  shortness of breath with exertion,  []  pain in feet when  walking, []  pain in feet when laying flat, []  history of blood clot in veins (DVT), []  history of phlebitis, []  swelling in legs, []  varicose veins  PULMONARY:  []  productive cough, []  asthma, []  wheezing  NEUROLOGIC:  []  weakness in arms or legs, []  numbness in arms or legs, []  difficulty speaking or slurred speech, []  temporary loss of vision in one eye, []  dizziness  HEMATOLOGIC:  []  bleeding problems, []  problems with blood clotting too easily  MUSCULOSKEL:  []  joint pain, []  joint swelling  GASTROINTEST:  []  vomiting blood, []  blood in stool     GENITOURINARY:  []  burning with urination, []  blood in urine  PSYCHIATRIC:  []  history of major depression  INTEGUMENTARY:  []  rashes, []  ulcers  CONSTITUTIONAL:  []  fever, []  chills  Physical Examination  Filed Vitals:   08/01/13 1409  BP: 191/74  Pulse: 155  Height: 5\' 1"  (1.549 m)  Weight: 152 lb (68.947 kg)  SpO2: 100%   Body mass index is 28.74 kg/(m^2).  General: A&O x 3, WDWN  Head: Bledsoe/AT  Ear/Nose/Throat: Hearing grossly intact, nares w/o erythema or drainage, oropharynx w/o Erythema/Exudate, Mallampati score: 3  Eyes: PERRLA, EOMI  Neck: Supple, no nuchal rigidity, no palpable LAD  Pulmonary: Sym exp, good air movt, CTAB, no rales, rhonchi, & wheezing, LIJ TDC  Cardiac: RRR, Nl S1, S2, no Murmurs, rubs or gallops  Vascular: Vessel Right Left  Radial Palpable Palpable  Ulnar Not Palpable Not Palpable  Brachial Palpable Palpable  Carotid Palpable, without bruit Palpable, without bruit  Aorta Not palpable N/A  Femoral Palpable Palpable  Popliteal Not palpable Not palpable  PT Faintly Palpable Faintly Palpable  DP Not Palpable Not Palpable   Gastrointestinal: soft, NTND, -G/R, - HSM, - masses, - CVAT B, not able to palpate aorta due to pannus  Musculoskeletal: M/S 5/5 throughout , Extremities without ischemic changes   Neurologic: CN 2-12 intact , Pain and light touch intact in extremities , Motor exam  as listed above  Psychiatric: Judgment intact, Mood & affect appropriate for pt's clinical situation  Dermatologic: See M/S exam for extremity exam, no rashes otherwise noted  Lymph : No Cervical, Axillary, or Inguinal lymphadenopathy   Outside Vein Mapping (Date: 25 AUG 14):   R arm: acceptable vein conduits include upper arm cephalic and basilic vein  L arm: acceptable vein  conduits include upper arm cephalic and basilic vein  Outside Studies/Documentation 2 pages of outside documents were reviewed including: [place] outpatient venogram report.  Medical Decision Making  Gail Chapman is a 57 y.o. female who presents with Decompensated CHF, CKD III-IV requiring hemodialysis for fluid management  Based on vein mapping and examination, this patient's permanent access options include: B BC AVF, B stage BVT.  I would start with L BC AVF  I had an extensive discussion with this patient in regards to the nature of access surgery, including risk, benefits, and alternatives.    The patient is aware that the risks of access surgery include but are not limited to: bleeding, infection, steal syndrome, nerve damage, ischemic monomelic neuropathy, failure of access to mature, and possible need for additional access procedures in the future.  The patient is going to talk over her options with her nephrologist.  Once they decide if she needs to be HD long-term, we can place the L [facility] AVF for her.  Leonides Sake, MD [facility] [place] Office: [phone] Pager: [phone]  08/01/2013, 3:04 PM

## 2013-08-05 ENCOUNTER — Other Ambulatory Visit (HOSPITAL_COMMUNITY): Payer: Self-pay | Admitting: Adult Health

## 2013-08-14 ENCOUNTER — Other Ambulatory Visit: Payer: Self-pay

## 2013-09-26 ENCOUNTER — Telehealth (HOSPITAL_COMMUNITY): Payer: Self-pay | Admitting: Cardiology

## 2013-09-26 NOTE — Telephone Encounter (Signed)
Pt called to request a phone call from Dr.Bensimhon Pt would like to know if she can D/C dialysis  Please call after 4pm

## 2013-09-29 NOTE — Telephone Encounter (Signed)
Dr Gala Romney states he did call pt Fri and Left message to call back

## 2013-11-05 ENCOUNTER — Telehealth (HOSPITAL_COMMUNITY): Payer: Self-pay | Admitting: Cardiology

## 2013-11-05 NOTE — Telephone Encounter (Signed)
Pt called to request cardiac clearance

## 2013-11-06 ENCOUNTER — Encounter (HOSPITAL_COMMUNITY): Payer: Self-pay | Admitting: Internal Medicine

## 2013-11-07 NOTE — Telephone Encounter (Signed)
Dr Gala Romney called and spoke w/pt she needs a Tenckhoff catheter placed for peritoneal dialysis, he states pt is cleared, letter written and faxed to Dr Denny Peon at [phone]

## 2013-11-24 ENCOUNTER — Ambulatory Visit (HOSPITAL_COMMUNITY)
Admission: RE | Admit: 2013-11-24 | Discharge: 2013-11-24 | Disposition: A | Discharge status: Home / Self Care | Payer: BC Managed Care – PPO | Source: Ambulatory Visit | Attending: Internal Medicine | Admitting: Internal Medicine

## 2013-11-24 ENCOUNTER — Telehealth (HOSPITAL_COMMUNITY): Payer: Self-pay

## 2013-11-24 VITALS — BP 238/110 | HR 85 | Resp 18 | Wt 174.8 lb

## 2013-11-24 DIAGNOSIS — I5032 Chronic diastolic (congestive) heart failure: Secondary | ICD-10-CM

## 2013-11-24 DIAGNOSIS — I2789 Other specified pulmonary heart diseases: Secondary | ICD-10-CM | POA: Insufficient documentation

## 2013-11-24 DIAGNOSIS — E119 Type 2 diabetes mellitus without complications: Secondary | ICD-10-CM | POA: Insufficient documentation

## 2013-11-24 DIAGNOSIS — Z9981 Dependence on supplemental oxygen: Secondary | ICD-10-CM | POA: Insufficient documentation

## 2013-11-24 DIAGNOSIS — I509 Heart failure, unspecified: Secondary | ICD-10-CM

## 2013-11-24 DIAGNOSIS — I120 Hypertensive chronic kidney disease with stage 5 chronic kidney disease or end stage renal disease: Secondary | ICD-10-CM | POA: Insufficient documentation

## 2013-11-24 DIAGNOSIS — G4733 Obstructive sleep apnea (adult) (pediatric): Secondary | ICD-10-CM | POA: Insufficient documentation

## 2013-11-24 DIAGNOSIS — I10 Essential (primary) hypertension: Secondary | ICD-10-CM

## 2013-11-24 DIAGNOSIS — Z794 Long term (current) use of insulin: Secondary | ICD-10-CM | POA: Insufficient documentation

## 2013-11-24 DIAGNOSIS — N186 End stage renal disease: Secondary | ICD-10-CM

## 2013-11-24 DIAGNOSIS — J449 Chronic obstructive pulmonary disease, unspecified: Secondary | ICD-10-CM | POA: Insufficient documentation

## 2013-11-24 DIAGNOSIS — J4489 Other specified chronic obstructive pulmonary disease: Secondary | ICD-10-CM | POA: Insufficient documentation

## 2013-11-24 MED ORDER — CARVEDILOL 12.5 MG PO TABS: 12.5000 mg | ORAL_TABLET | Freq: Two times a day (BID) | ORAL | Status: AC

## 2013-11-24 MED ORDER — DOXAZOSIN MESYLATE 2 MG PO TABS
3.0000 mg | ORAL_TABLET | Freq: Every day | ORAL | Status: DC
Start: 2013-11-24 — End: 2015-05-05

## 2013-11-24 NOTE — Patient Instructions (Signed)
Take doxazosin 3 mg at bedtime  Follow up in 6 months

## 2013-11-24 NOTE — Telephone Encounter (Signed)
Left message for [facility] on referral answering service for patient to have 2nd opinion.  Clinic and patient information, including contact information left to follow up with [place] and patient to schedule referral.  Will follow up.

## 2013-11-24 NOTE — Progress Notes (Signed)
Patient ID: Gail Chapman, female   DOB: 11/28/1955, 58 y.o.   MRN: [id number]   Weight Range   Baseline proBNP    [facility]  HPI: Gail Chapman is a very pleasant 58 y/o woman from [place] with a h/o severe HTN, diastolic heart failure and ESRD.   She presented to [facility] on [facility]/19/2013 with decompensated HF and marked volume overload in the setting of severe HTN with a BP of 260/130. Her hospitalization was complicated by progressive renal failure, VDRF and a large pericardial effusion requiring pericardial window. She also underwent fat pad and myocardial biopsy to exclude amyloid. Discharged 03/06/13 with HD to start 5/31 at [facility] Tuesday/Thursday, and Saturday.   BP remains high. Unable to take clonidine (itching) and hydralazine (photophobia). Nephrologist started spiro. Over last few days BP running close to 200.   Had RHC in May but no coronary angio. Myoview 2008 with no ischemia.   She returns for follow up with her husband.  She continues on dialysis twice a week. She is supposed to take HD 3 days a week but she can not tolerate due to nausea.  Says BP comes down at HD to 140 range and they have to slow down or stop. BP at home SBP 200s. Intolerant hydralazine or clonidine.  Weight at home 163 pounds. Still makes urine 4-5 times a day. Gail Chapman is recommending PD but she wants second opinion about need for dialysis altogether. She has refused permanent HD access as one of her family members or friends died as a result of the procedure. Did not take medications today.      ROS: All systems negative except as listed in HPI, PMH and Problem List.  Past Medical History  Diagnosis Date  . Diabetes mellitus without complication   . Chronic diastolic CHF (congestive heart failure)     a. 01/2007 MV: No isch/infarct, nl EF;  b. 2012 Cath: "normal" per patient.  Performed in [place], [place] by Dr. Graciela Husbands.  . Pulmonary hypertension     a. on home O2 @ 3lpm  24hrs/day  . HTN (hypertension)     a. Dx in 1993.  Marland Kitchen LVH (left ventricular hypertrophy)   . OSA (obstructive sleep apnea)     a. uses CPAP  . Physical deconditioning   . Renal disorder   . COPD (chronic obstructive pulmonary disease)     Current Outpatient Prescriptions  Medication Sig Dispense Refill  . amLODipine (NORVASC) 10 MG tablet TAKE 1 TABLET BY MOUTH EVERY DAY  30 tablet  3  . aspirin EC 325 MG EC tablet Take 1 tablet (325 mg total) by mouth daily.  30 tablet  3  . carvedilol (COREG) 12.5 MG tablet Take 1 tablet (12.5 mg total) by mouth 2 (two) times daily with a meal.  60 tablet  3  . Docusate Calcium (STOOL SOFTENER PO) Take 1 capsule by mouth as needed.      . doxazosin (CARDURA) 2 MG tablet Take 1 tablet (2 mg total) by mouth at bedtime.      . FUROSEMIDE PO Take 1 tablet by mouth daily.      . insulin aspart (NOVOLOG) 100 UNIT/ML injection Inject 4 Units into the skin 3 (three) times daily with meals. Per sliding scale      . insulin detemir (LEVEMIR) 100 UNIT/ML injection Inject 14 Units into the skin at bedtime. Hold if CBG<150      . levofloxacin (LEVAQUIN) 250 MG tablet Take  1 tablet (250 mg total) by mouth daily.  6 tablet  0  . pantoprazole (PROTONIX) 40 MG tablet Take 40 mg by mouth daily.      . promethazine (PHENERGAN) 25 MG suppository Place 1 suppository (25 mg total) rectally at bedtime.  12 each  3  . spironolactone (ALDACTONE) 25 MG tablet Take 25 mg by mouth daily.       No current facility-administered medications for this encounter.     PHYSICAL EXAM: Filed Vitals:   11/24/13 1124  BP: 238/110  Pulse: 85  Resp: 18  Weight: 174 lb 12 oz (79.266 kg)  SpO2: 92%    General:  Sitting in WC  No resp difficulty Husband present  HEENT: normal Neck: supple. JVP jaw. Carotids 2+ bilaterally; no bruits. No lymphadenopathy or thryomegaly appreciated. Cor: PMI normal. Regular rate & rhythm. +s4 2/6 TR  Perm cath in place in Left chest  Lungs: Clear.   Abdomen: soft, obese. nontender, distended. No hepatosplenomegaly. No bruits or masses. Good bowel sounds. Extremities: no cyanosis, clubbing, rash, tr edema. Slightly weak in R leg  Neuro: alert & orientedx3, cranial nerves grossly intact. Moves all 4 extremities w/o difficulty. Affect pleasant    ASSESSMENT & PLAN:  1. Chronic Diastolic Heart Failure    --Volume status well controlled with HD. Stop spironolactone given ongoing dialysis. Refer to nephrology per patients request for a second opinion.  2. Chronic renal failure, end-stage- per nephrology. Refer to [facility]  For a second opinion.  3. HTN, severe     --BP remains markedly elevated. Unable to tolerate clonidine or hydralazine due side effects. Stop spiro due to CKD. . Increase cardura to 6 mg at bed time.   Follow up in 2-3 months  Chapman,AMY, NP-C 11:43 AM  Patient seen and examined with Gail BecketAmy Clegg, NP. We discussed all aspects of the encounter. I agree with the assessment and plan as stated above.   Overall she looks much better since starting dialysis. Volume status is effectively controlled which it had not been prior to HD. I expressed to her my feeling that I felt she needed to stay on HD. She would like another opinion and we will refer her to [facility] to discuss. Agree with increasing Cardura.   Time spent 45 mins with over half that time spent discussing above.   Audrianna Driskill,MD 10:55 PM

## 2014-04-22 ENCOUNTER — Telehealth (HOSPITAL_COMMUNITY): Payer: Self-pay | Admitting: Cardiology

## 2014-04-22 NOTE — Telephone Encounter (Signed)
Pt called to request refill on insulin, Advised pt that normally we don't follow diabetes and refill insulin however I will speak with providers regarding med. Advised pt she should  find PCP in [place], [place] for further management of Diabetes

## 2014-04-23 MED ORDER — INSULIN ASPART 100 UNIT/ML ~~LOC~~ SOLN: 10.0000 [IU] | Freq: Three times a day (TID) | SUBCUTANEOUS | Status: DC

## 2014-04-23 MED ORDER — INSULIN DETEMIR 100 UNIT/ML ~~LOC~~ SOLN: 4.0000 [IU] | Freq: Every day | SUBCUTANEOUS | Status: DC

## 2014-04-23 NOTE — Telephone Encounter (Signed)
Advised pt we can do not refill insulin, she states before being in the hospital here she had an endocrinologist in [place] she last saw him about 6 months ago, however she states she has called his office to get an appointment and [place] and [place] down, she has gotten an appointment with another endocrinologist but that is not until Aug and they will not refill her meds without seeing her, she states she is now out of both of her insulins and states blood sugar has been running around 200 and she occiasionally gets blurry vision, advised I will send in both kinds of insulin for 1 bottle each and no refills, she is aware and thankful

## 2014-04-24 ENCOUNTER — Telehealth (HOSPITAL_COMMUNITY): Payer: Self-pay | Admitting: Vascular Surgery

## 2014-04-24 NOTE — Telephone Encounter (Signed)
Called Novolog Flexpen and Levimer FlexPen # QS no refills into pharmacy Pt aware

## 2014-04-24 NOTE — Telephone Encounter (Signed)
REFILL... Pt called... Insulin vials were called in for pt but pt needs Flex pins. Please call pharmacy for correct presciption... Per pt

## 2014-05-01 ENCOUNTER — Telehealth (HOSPITAL_COMMUNITY): Payer: Self-pay | Admitting: Vascular Surgery

## 2014-05-01 NOTE — Telephone Encounter (Signed)
PT called she needs Diabetic test strips ordered..Marland Kitchen

## 2014-05-04 MED ORDER — GLUCOSE BLOOD VI STRP: ORAL_STRIP | Status: DC

## 2014-05-04 NOTE — Telephone Encounter (Signed)
As requested rx sent for DM test strips

## 2014-05-05 ENCOUNTER — Encounter (HOSPITAL_COMMUNITY): Payer: Self-pay | Admitting: Vascular Surgery

## 2014-06-05 ENCOUNTER — Encounter (HOSPITAL_COMMUNITY): Payer: BC Managed Care – PPO

## 2014-06-11 ENCOUNTER — Encounter (HOSPITAL_COMMUNITY): Payer: BC Managed Care – PPO

## 2014-06-16 ENCOUNTER — Encounter (HOSPITAL_COMMUNITY): Payer: BC Managed Care – PPO

## 2014-06-20 ENCOUNTER — Other Ambulatory Visit (HOSPITAL_COMMUNITY): Payer: Self-pay | Admitting: Internal Medicine

## 2014-07-14 ENCOUNTER — Ambulatory Visit (HOSPITAL_COMMUNITY)
Admission: RE | Admit: 2014-07-14 | Discharge: 2014-07-14 | Disposition: A | Discharge status: Home / Self Care | Payer: BC Managed Care – PPO | Source: Ambulatory Visit | Attending: Internal Medicine | Admitting: Internal Medicine

## 2014-07-14 VITALS — BP 177/79 | HR 84 | Resp 18 | Wt 153.5 lb

## 2014-07-14 DIAGNOSIS — I5032 Chronic diastolic (congestive) heart failure: Secondary | ICD-10-CM | POA: Insufficient documentation

## 2014-07-14 DIAGNOSIS — R112 Nausea with vomiting, unspecified: Secondary | ICD-10-CM | POA: Insufficient documentation

## 2014-07-14 DIAGNOSIS — Z992 Dependence on renal dialysis: Secondary | ICD-10-CM | POA: Insufficient documentation

## 2014-07-14 DIAGNOSIS — E119 Type 2 diabetes mellitus without complications: Secondary | ICD-10-CM | POA: Diagnosis not present

## 2014-07-14 DIAGNOSIS — N186 End stage renal disease: Secondary | ICD-10-CM | POA: Insufficient documentation

## 2014-07-14 DIAGNOSIS — I120 Hypertensive chronic kidney disease with stage 5 chronic kidney disease or end stage renal disease: Secondary | ICD-10-CM | POA: Diagnosis not present

## 2014-07-14 DIAGNOSIS — Z79899 Other long term (current) drug therapy: Secondary | ICD-10-CM | POA: Diagnosis not present

## 2014-07-14 DIAGNOSIS — I10 Essential (primary) hypertension: Secondary | ICD-10-CM

## 2014-07-14 DIAGNOSIS — Z7982 Long term (current) use of aspirin: Secondary | ICD-10-CM | POA: Diagnosis not present

## 2014-07-14 DIAGNOSIS — J449 Chronic obstructive pulmonary disease, unspecified: Secondary | ICD-10-CM | POA: Diagnosis not present

## 2014-07-14 DIAGNOSIS — I272 Other secondary pulmonary hypertension: Secondary | ICD-10-CM | POA: Diagnosis not present

## 2014-07-14 DIAGNOSIS — G4733 Obstructive sleep apnea (adult) (pediatric): Secondary | ICD-10-CM | POA: Insufficient documentation

## 2014-07-14 DIAGNOSIS — I517 Cardiomegaly: Secondary | ICD-10-CM | POA: Diagnosis not present

## 2014-07-14 DIAGNOSIS — Z794 Long term (current) use of insulin: Secondary | ICD-10-CM | POA: Insufficient documentation

## 2014-07-14 MED ORDER — PRAVASTATIN SODIUM 40 MG PO TABS: 40.0000 mg | ORAL_TABLET | Freq: Every day | ORAL | Status: DC

## 2014-07-14 MED ORDER — MINOXIDIL 10 MG PO TABS
5.0000 mg | ORAL_TABLET | Freq: Three times a day (TID) | ORAL | Status: DC
Start: 2014-07-14 — End: 2015-05-05

## 2014-07-14 NOTE — Patient Instructions (Signed)
Follow up in 6 months with an ECHO   Take pravastatin 40 mg daily   Take minoxidil 5 mg three times a day   Do the following things EVERYDAY: 1) Weigh yourself in the morning before breakfast. Write it down and keep it in a log. 2) Take your medicines as prescribed 3) Eat low salt foods-Limit salt (sodium) to 2000 mg per day.  4) Stay as active as you can everyday 5) Limit all fluids for the day to less than 2 liters

## 2014-07-14 NOTE — Progress Notes (Addendum)
Patient ID: Gail Chapman, female   DOB: [birth year], 58 y.o.   MRN: [id number]    [facility]  HPI: Gail Chapman is a very pleasant 58 y/o woman from [place] with a h/o severe HTN, diastolic heart failure, MS,  and ESRD.   She presented to [facility] on 02/19/2013 with decompensated HF and marked volume overload in the setting of severe HTN with a BP of 260/130. Her hospitalization was complicated by progressive renal failure, VDRF and a large pericardial effusion requiring pericardial window. She also underwent fat pad and myocardial biopsy to exclude amyloid. Discharged 03/06/13 with HD to start 5/31 at [facility] Tuesday/Thursday, and Saturday.   BP remains high. Unable to take clonidine (itching) and hydralazine (photophobia). Nephrologist started spiro. Over last few days BP running close to 200.   Had RHC in May but no coronary angio. Myoview 2008 with no ischemia.   She returns for follow up with her Gail Chapman.  Last visit cardura was increased to 6 mg at be time. SBP remains high but when she is on hemodialysis she is hypotensive.  Dry weight 153 pounds. Still making urine 3-4 times a day. Uses a walker to ambulated. Denies SOB/PND/Orthopnea Uses CPAP nightly.  HD continues  M-W-F but having nausea/vomiting.    Gail Chapman is recommending PD but she wants second opinion about need for dialysis altogether. She did not make that appointment.  She has refused permanent HD access as one of her family members or friends died as a result of the procedure.     ROS: All systems negative except as listed in HPI, PMH and Problem List.  Past Medical History  Diagnosis Date  . Diabetes mellitus without complication   . Chronic diastolic CHF (congestive heart failure)     a. 01/2007 MV: No isch/infarct, nl EF;  b. 2012 Cath: "normal" per patient.  Performed in [facility] by [facility].  . Pulmonary hypertension     a. on home O2 @ 3lpm 24hrs/day  . HTN (hypertension)    a. Dx in 1993.  Marland Kitchen LVH (left ventricular hypertrophy)   . OSA (obstructive sleep apnea)     a. uses CPAP  . Physical deconditioning   . Renal disorder   . COPD (chronic obstructive pulmonary disease)     Current Outpatient Prescriptions  Medication Sig Dispense Refill  . amLODipine (NORVASC) 10 MG tablet TAKE 1 TABLET BY MOUTH EVERY DAY  30 tablet  3  . aspirin EC 325 MG EC tablet Take 1 tablet (325 mg total) by mouth daily.  30 tablet  3  . carvedilol (COREG) 12.5 MG tablet Take 1 tablet (12.5 mg total) by mouth 2 (two) times daily with a meal. On takes on non dialysis day.  60 tablet  3  . Docusate Calcium (STOOL SOFTENER PO) Take 1 capsule by mouth as needed.      . doxazosin (CARDURA) 2 MG tablet Take 1.5 tablets (3 mg total) by mouth at bedtime.  45 tablet  6  . glucose blood (FREESTYLE LITE) test strip Use as instructed  100 each  12  . insulin aspart (NOVOLOG) 100 UNIT/ML injection Inject 10 Units into the skin 3 (three) times daily with meals. Per sliding scale  10 mL  0  . insulin detemir (LEVEMIR) 100 UNIT/ML injection Inject 0.04 mLs (4 Units total) into the skin at bedtime. Hold if CBG<150  10 mL  0  . minoxidil (LONITEN) 10 MG tablet Take 5 mg  by mouth daily.      . promethazine (PHENERGAN) 25 MG suppository Place 1 suppository (25 mg total) rectally at bedtime.  12 each  3   No current facility-administered medications for this encounter.     PHYSICAL EXAM: Filed Vitals:   07/14/14 1420  BP: 177/79  Pulse: 84  Resp: 18  Weight: 153 lb 8 oz (69.627 kg)  SpO2: 94%    General:  Sitting in WC  No resp difficulty Gail Chapman present  HEENT: normal Neck: supple. JVP 7-8. Carotids 2+ bilaterally; no bruits. No lymphadenopathy or thryomegaly appreciated. Cor: PMI normal. Regular rate & rhythm. +s4 2/6 TR  Perm cath in place in Left chest  Lungs: Clear.  Abdomen: soft, obese. nontender, distended. No hepatosplenomegaly. No bruits or masses. Good bowel sounds. Extremities: no  cyanosis, clubbing, rash, tr edema. Slightly weak in R leg  Neuro: alert & orientedx3, cranial nerves grossly intact. Moves all 4 extremities w/o difficulty. Affect pleasant    ASSESSMENT & PLAN:  1. Chronic Diastolic Heart Failure    --Volume status well controlled with HD. 2. Chronic renal failure, end-stage- per nephrology. She was referred to [facility] for a second opinion however she did not go to the appointment.  3. HTN, severe     --BP elevated. Routinely elevated at 170-11480mmHG. Stressed need to get BP down to reduce CVA risk. Unable to tolerate clonidine or hydralazine due side effects. Continue cardura to 6 mg at bed time. Says she gets dizzy if minoxidil at 10 bid. Increase minoxidil 5 mg three times a day. Add 40 mg pravastatin daily   Follow up in in 6 months with ECHO    Gail Chapman 2:44 PM  Patient seen and examined with Gail Chapman. We discussed all aspects of the encounter. I agree with the assessment and plan as stated above.   Volume status maintained with HD. BP still very elevated but options limited. Will increase minoxidil to 5 tid. Add statin. (previously failed atorva so will use prava). F/u in 6 months with echo.  Gail Chapman 3:11 PM

## 2014-07-24 ENCOUNTER — Emergency Department (HOSPITAL_COMMUNITY): Payer: BC Managed Care – PPO

## 2014-07-24 ENCOUNTER — Emergency Department (HOSPITAL_COMMUNITY)
Admission: EM | Admit: 2014-07-24 | Discharge: 2014-07-24 | Disposition: A | Discharge status: Home / Self Care | Payer: BC Managed Care – PPO | Attending: Emergency Medicine | Admitting: Emergency Medicine

## 2014-07-24 ENCOUNTER — Other Ambulatory Visit: Payer: Self-pay | Admitting: Physician Assistant

## 2014-07-24 DIAGNOSIS — Z794 Long term (current) use of insulin: Secondary | ICD-10-CM | POA: Diagnosis not present

## 2014-07-24 DIAGNOSIS — E119 Type 2 diabetes mellitus without complications: Secondary | ICD-10-CM | POA: Insufficient documentation

## 2014-07-24 DIAGNOSIS — I5032 Chronic diastolic (congestive) heart failure: Secondary | ICD-10-CM | POA: Insufficient documentation

## 2014-07-24 DIAGNOSIS — N186 End stage renal disease: Secondary | ICD-10-CM | POA: Insufficient documentation

## 2014-07-24 DIAGNOSIS — Z7982 Long term (current) use of aspirin: Secondary | ICD-10-CM | POA: Diagnosis not present

## 2014-07-24 DIAGNOSIS — Z9981 Dependence on supplemental oxygen: Secondary | ICD-10-CM | POA: Insufficient documentation

## 2014-07-24 DIAGNOSIS — Z992 Dependence on renal dialysis: Secondary | ICD-10-CM | POA: Insufficient documentation

## 2014-07-24 DIAGNOSIS — Z79899 Other long term (current) drug therapy: Secondary | ICD-10-CM | POA: Insufficient documentation

## 2014-07-24 DIAGNOSIS — J449 Chronic obstructive pulmonary disease, unspecified: Secondary | ICD-10-CM | POA: Diagnosis not present

## 2014-07-24 DIAGNOSIS — G4733 Obstructive sleep apnea (adult) (pediatric): Secondary | ICD-10-CM | POA: Insufficient documentation

## 2014-07-24 DIAGNOSIS — R0789 Other chest pain: Secondary | ICD-10-CM | POA: Insufficient documentation

## 2014-07-24 DIAGNOSIS — R079 Chest pain, unspecified: Secondary | ICD-10-CM | POA: Diagnosis present

## 2014-07-24 DIAGNOSIS — N185 Chronic kidney disease, stage 5: Secondary | ICD-10-CM

## 2014-07-24 DIAGNOSIS — Z87891 Personal history of nicotine dependence: Secondary | ICD-10-CM | POA: Diagnosis not present

## 2014-07-24 DIAGNOSIS — I120 Hypertensive chronic kidney disease with stage 5 chronic kidney disease or end stage renal disease: Secondary | ICD-10-CM | POA: Diagnosis not present

## 2014-07-24 LAB — COMPREHENSIVE METABOLIC PANEL
ALK PHOS: 51 U/L (ref 39–117)
ALT: 6 U/L (ref 0–35)
AST: 13 U/L (ref 0–37)
Albumin: 3.6 g/dL (ref 3.5–5.2)
Anion gap: 17 — ABNORMAL HIGH (ref 5–15)
BILIRUBIN TOTAL: 0.3 mg/dL (ref 0.3–1.2)
BUN: 55 mg/dL — AB (ref 6–23)
CHLORIDE: 93 meq/L — AB (ref 96–112)
CO2: 26 meq/L (ref 19–32)
Calcium: 9.1 mg/dL (ref 8.4–10.5)
Creatinine, Ser: 6.85 mg/dL — ABNORMAL HIGH (ref 0.50–1.10)
GFR, EST AFRICAN AMERICAN: 7 mL/min — AB (ref >90)
GFR, EST NON AFRICAN AMERICAN: 6 mL/min — AB (ref >90)
GLUCOSE: 156 mg/dL — AB (ref 70–99)
POTASSIUM: 5.1 meq/L (ref 3.7–5.3)
SODIUM: 136 meq/L — AB (ref 137–147)
TOTAL PROTEIN: 8.6 g/dL — AB (ref 6.0–8.3)

## 2014-07-24 LAB — CBC
HCT: 40.7 % (ref 36.0–46.0)
Hemoglobin: 12.7 g/dL (ref 12.0–15.0)
MCH: 28.9 pg (ref 26.0–34.0)
MCHC: 31.2 g/dL (ref 30.0–36.0)
MCV: 92.5 fL (ref 78.0–100.0)
Platelets: 258 10*3/uL (ref 150–400)
RBC: 4.4 MIL/uL (ref 3.87–5.11)
RDW: 15.2 % (ref 11.5–15.5)
WBC: 8.2 10*3/uL (ref 4.0–10.5)

## 2014-07-24 LAB — I-STAT TROPONIN, ED: Troponin i, poc: 0.04 ng/mL (ref 0.00–0.08)

## 2014-07-24 NOTE — ED Notes (Signed)
Cp and h/a that comes and goes  that started this am had some sob but no nausea.  Just had  Dialysis cath replaced last week left side

## 2014-07-24 NOTE — Consult Note (Signed)
CARDIOLOGY CONSULT NOTE   Patient ID: Gail Chapman MRN: [id number], DOB/AGE: 56/02/1956   Admit date: 07/24/2014 Date of Consult: 07/24/2014   Primary Physician: No PCP Per Patient Primary Cardiologist: Dr. Gala RomneyBensimhon  Pt. Profile  58 year old [place] female with past medical history of end-stage renal disease on hemodialysis Monday Wednesday Friday, history of hypertensive urgency, chronic diastolic heart failure, diabetes, pulmonary hypertension, obstructive sleep apnea, history of MS, history of pericardial effusion status post pericardial window on 02/26/2014 presented with CP  Problem List  Past Medical History  Diagnosis Date  . Diabetes mellitus without complication   . Chronic diastolic CHF (congestive heart failure)     a. 01/2007 MV: No isch/infarct, nl EF;  b. 2012 Cath: "normal" per patient.  [place] in [place] by Dr. Graciela Husbandshauhan.  . Pulmonary hypertension     a. on home O2 @ 3lpm 24hrs/day  . HTN (hypertension)     a. Dx in 1993.  Marland Kitchen. LVH (left ventricular hypertrophy)   . OSA (obstructive sleep apnea)     a. uses CPAP  . Physical deconditioning   . Renal disorder   . COPD (chronic obstructive pulmonary disease)     Past Surgical History  Procedure Laterality Date  . Knee surgery    . Carpal tunnel release    . Tubal ligation    . Cholecystectomy    . Mass excision      a. under axilla.  . Subxyphoid pericardial window N/A 02/26/2013    Procedure: SUBXYPHOID PERICARDIAL WINDOW;  Surgeon: Delight OvensEdward B Gerhardt, MD;  [facility];  Service: Thoracic;  Laterality: N/A;     Allergies  Allergies  Allergen Reactions  . Clonidine Derivatives     Hand itching  . Hydralazine     Visual disturbances   . Labetalol     Fatigue   . Vancomycin Rash    "shut down my kidneys"    HPI   The patient is a 58 year old African American female with past medical history of end-stage renal disease on hemodialysis M/W/F, history of hypertensive urgency,  chronic diastolic heart failure, diabetes, pulmonary hypertension, obstructive sleep apnea, history of MS, history of pericardial effusion status post pericardial window on 02/26/2014. According to the patient's husband, for as long as he had known her, her blood pressure has always running in the 200/100 range. At best her blood pressure can be around 180s systolic. According to the patient, she has this intermittent sharp squeezing chest discomfort on her left substernal area on a monthly basis and would resolve within 5 minutes. Her last stress test was in 2008 which did not reveal any significant ischemia. Her last echocardiogram on 02/24/2013 showed EF of 60%, mildly dilated left atrium. She denies any recent shortness of breath, lower extremity edema, orthopnea or paroxysmal nocturnal dyspnea. The last time she saw Dr. Gala RomneyBensimhon was 10 days ago at which time her minoxidil was changed to 5 mg 3 times a day. According to the patient, last Friday, her left pectoral hemodialysis catheter was changed because to previous catheter cracked. She underwent hemodialysis Monday and Wednesday without problem.  Patient was sitting on the side of the bed around 8:30 AM on 07/24/2014 when she experienced a sharp squeezing left-sided chest pain radiating to bilateral neck. She endorsed some headache and mild shortness of breath, however no significant dizziness. Unlike her previous chest discomfort, this episode persisted for several hours prompting the patient to seek medical attention at [facility]. On arrival,  her blood pressure was 214/87. O2 saturation 96% on room air. Pulse 88. She did not go to her dialysis today which is scheduled around 3:00. Significant laboratory finding include sodium 136, potassium 4.1, creatinine 6.85, glucose 156. Initial troponin was negative. Chest x-ray was negative for acute process. EKG showed normal sinus rhythm with T wave inversion in the lateral leads which has been present  since 2014, however appears to be more accentuated. Cardiology has been consulted for chest pain. By the time cardiology has seen the patient, she is chest pain-free.    Inpatient Medications    Family History Family History  Problem Relation Age of Onset  . Diabetes Mother   . CAD Mother   . Cancer Mother   . Hypertension Mother   . Cancer Father   . Diabetes Father   . Hyperlipidemia Father   . Hypertension Father   . Heart attack Father      Social History History   Social History  . Marital Status: Married    Spouse Name: N/A    Number of Children: N/A  . Years of Education: N/A   Occupational History  . Not on file.   Social History Main Topics  . Smoking status: Former Games developer  . Smokeless tobacco: Not on file  . Alcohol Use: No  . Drug Use: No     Comment: "smoked weed"  . Sexual Activity: Not on file   Other Topics Concern  . Not on file   Social History Narrative   Lives in [place] with her husband.  They have 2 grown children.  She is on disability.     Review of Systems  General:  No chills, fever, night sweats or weight changes.  Cardiovascular:  No edema, orthopnea, palpitations, paroxysmal nocturnal dyspnea. +chest pain,  dyspnea on exertion Dermatological: No rash, lesions/masses Respiratory: No cough, dyspnea Urologic: No hematuria, dysuria Abdominal:   No nausea, vomiting, diarrhea, bright red blood per rectum, melena, or hematemesis Neurologic:  No visual changes, wkns, changes in mental status. All other systems reviewed and are otherwise negative except as noted above.  Physical Exam  Blood pressure 206/87, pulse 88, temperature 99 F (37.2 C), resp. rate 16, height 5\' 1"  (1.549 m), weight 153 lb (69.4 kg), SpO2 100.00%.  General: Pleasant, NAD Psych: Normal affect. Neuro: Alert and oriented X 3. Moves all extremities spontaneously. HEENT: Normal  Neck: Supple without bruits or JVD. Lungs:  Resp regular and unlabored, CTA. L  pectoral HD catheter Heart: RRR no s3, s4, or murmurs. Abdomen: Soft, non-tender, non-distended, BS + x 4.  Extremities: No clubbing, cyanosis or edema. DP/PT/Radials 2+ and equal bilaterally.  Labs  No results found for this basename: CKTOTAL, CKMB, TROPONINI,  in the last 72 hours Lab Results  Component Value Date   WBC 8.2 07/24/2014   HGB 12.7 07/24/2014   HCT 40.7 07/24/2014   MCV 92.5 07/24/2014   PLT 258 07/24/2014    Recent Labs Lab 07/24/14 1229  NA 136*  K 5.1  CL 93*  CO2 26  BUN 55*  CREATININE 6.85*  CALCIUM 9.1  PROT 8.6*  BILITOT 0.3  ALKPHOS 51  ALT 6  AST 13  GLUCOSE 156*    Radiology/Studies  Dg Chest 2 View  07/24/2014   CLINICAL DATA:  Chest pain.  Headache.  EXAM: CHEST  2 VIEW  COMPARISON:  04/10/2013.  FINDINGS: LEFT IJ dialysis catheter is present with the distal tip in the RIGHT atrium. Enlargement of  the cardiopericardial silhouette. No airspace disease. No pleural effusions are identified. Cholecystectomy clips are present in the right upper quadrant.  IMPRESSION: Cardiomegaly without failure.   Electronically Signed   By: Andreas Newport M.D.   On: 07/24/2014 13:17    ECG  EKG showed normal sinus rhythm with T wave inversion in the lateral leads which has been present since 2014, however appears to be more accentuated.  ASSESSMENT AND PLAN  1. Chest pain   - resolved, neg trop, TWI in lateral lead to accentuated however no other changes  - somewhat atypical in nature, unclear if related to the recently placed catheter.   - discussed with Dr. Anne Fu, plan to discharge from ED and have close outpatient followup with Dr. Gala Romney  2. Hypertensive urgency: chronic with baseline BP 200/100 3. ESRD on HD M/W/F 4. chronic diastolic heart failure 5. Diabetes 6. pulmonary hypertension 7. obstructive sleep apnea 8. history of MS 9. history of pericardial effusion status post pericardial window on 02/26/2014 - negative myocardial/fatpad  biopsy for restrictive disease  Signed, Azalee Course, PA-C 07/24/2014, 3:05 PM  Personally seen and examined. Agree with above. Atypical sharp CP surrounding site of newly placed dialysis catheter. No fever, no cough. Troponin normal. ECG accentuated TWI but no new changes (could be hypertensive changes). CXR normal (no PTX).  No rub on exam. Feels well currently.   Currently CP free. OK with DC to outpatient dialysis.  Discussed with patient and husband and ER MD.  Agreeable with plan.   Will have close follow up in DB clinic.   Donato Schultz, MD

## 2014-07-24 NOTE — ED Provider Notes (Signed)
CSN: [id number]     Arrival date & time 07/24/14  1215 History   First MD Initiated Contact with Patient 07/24/14 1319     Chief Complaint  Patient presents with  . Chest Pain  . Headache     (Consider location/radiation/quality/duration/timing/severity/associated sxs/prior Treatment) Patient is a 58 y.o. female presenting with chest pain.  Chest Pain Pain location:  Substernal area Pain quality: sharp   Pain radiates to:  L jaw Pain radiates to the back: no   Pain severity:  Severe Onset quality:  Sudden Duration:  5 hours Timing:  Intermittent Progression:  Improving Chronicity:  New Context: at rest   Relieved by:  None tried Worsened by:  Nothing tried Ineffective treatments:  None tried Associated symptoms: headache   Associated symptoms: no abdominal pain, no back pain, no cough, no diaphoresis, no fever, no lower extremity edema, no nausea, no shortness of breath and not vomiting   Risk factors: diabetes mellitus, high cholesterol and hypertension     Past Medical History  Diagnosis Date  . Diabetes mellitus without complication   . Chronic diastolic CHF (congestive heart failure)     a. 01/2007 MV: No isch/infarct, nl EF;  b. 2012 Cath: "normal" per patient.  Performed in [place] by Dr. Graciela Husbandshauhan.  . Pulmonary hypertension     a. on home O2 @ 3lpm 24hrs/day  . HTN (hypertension)     a. Dx in 1993.  Marland Kitchen. LVH (left ventricular hypertrophy)   . OSA (obstructive sleep apnea)     a. uses CPAP  . Physical deconditioning   . Renal disorder   . COPD (chronic obstructive pulmonary disease)    Past Surgical History  Procedure Laterality Date  . Knee surgery    . Carpal tunnel release    . Tubal ligation    . Cholecystectomy    . Mass excision      a. under axilla.  . Subxyphoid pericardial window N/A 02/26/2013    Procedure: SUBXYPHOID PERICARDIAL WINDOW;  Surgeon: Delight OvensEdward B Gerhardt, MD;  [facility];  Service: Thoracic;  Laterality: N/A;   Family History   Problem Relation Age of Onset  . Diabetes Mother   . CAD Mother   . Cancer Mother   . Hypertension Mother   . Cancer Father   . Diabetes Father   . Hyperlipidemia Father   . Hypertension Father   . Heart attack Father    History  Substance Use Topics  . Smoking status: Former Games developermoker  . Smokeless tobacco: Not on file  . Alcohol Use: No   OB History   Grav Para Term Preterm Abortions TAB SAB Ect Mult Living                 Review of Systems  Constitutional: Negative for fever, chills and diaphoresis.  HENT: Negative for congestion and sore throat.   Eyes: Negative for visual disturbance.  Respiratory: Negative for cough, shortness of breath and wheezing.   Cardiovascular: Positive for chest pain.  Gastrointestinal: Negative for nausea, vomiting, abdominal pain, diarrhea and constipation.  Genitourinary: Negative for dysuria, difficulty urinating and vaginal pain.  Musculoskeletal: Negative for arthralgias, back pain and myalgias.  Skin: Negative for rash.  Neurological: Positive for headaches. Negative for syncope.  Psychiatric/Behavioral: Negative for behavioral problems.  All other systems reviewed and are negative.     Allergies  Clonidine derivatives; Hydralazine; Labetalol; and Vancomycin  Home Medications   Prior to Admission medications   Medication Sig Start  Date End Date Taking? Authorizing Provider  aspirin EC 325 MG EC tablet Take 1 tablet (325 mg total) by mouth daily. 03/06/13  Yes Amy D Clegg, NP  carvedilol (COREG) 12.5 MG tablet Take 1 tablet (12.5 mg total) by mouth 2 (two) times daily with a meal. On takes on non dialysis day. 11/24/13  Yes Amy D Clegg, NP  minoxidil (LONITEN) 10 MG tablet Take 0.5 tablets (5 mg total) by mouth 3 (three) times daily. 07/14/14  Yes Amy D Clegg, NP  pravastatin (PRAVACHOL) 40 MG tablet Take 1 tablet (40 mg total) by mouth daily. 07/14/14  Yes Amy D Clegg, NP  promethazine (PHENERGAN) 25 MG suppository Place 1 suppository  (25 mg total) rectally at bedtime. 03/06/13  Yes Amy D Clegg, NP  doxazosin (CARDURA) 2 MG tablet Take 1.5 tablets (3 mg total) by mouth at bedtime. 11/24/13   Amy D Filbert Schilder, NP  glucose blood (FREESTYLE LITE) test strip Use as instructed 05/04/14   Dolores Patty, MD  insulin aspart (NOVOLOG) 100 UNIT/ML injection Inject 10 Units into the skin 3 (three) times daily with meals. Per sliding scale 04/23/14   Dolores Patty, MD  insulin detemir (LEVEMIR) 100 UNIT/ML injection Inject 0.04 mLs (4 Units total) into the skin at bedtime. Hold if CBG<150 04/23/14   Dolores Patty, MD   BP 190/89  Pulse 85  Temp(Src) 98 F (36.7 C) (Oral)  Resp 18  Ht 5\' 1"  (1.549 m)  Wt 153 lb (69.4 kg)  BMI 28.92 kg/m2  SpO2 100% Physical Exam  Vitals reviewed. Constitutional: She is oriented to person, place, and time. She appears well-developed and well-nourished. No distress.  HENT:  Head: Normocephalic and atraumatic.  Eyes: EOM are normal.  Neck: Normal range of motion.  Cardiovascular: Normal rate, regular rhythm and normal heart sounds.   No murmur heard. Pulmonary/Chest: Effort normal and breath sounds normal. No respiratory distress. She has no wheezes.  Abdominal: Soft. There is no tenderness.  Musculoskeletal: She exhibits no edema.  Neurological: She is alert and oriented to person, place, and time.  Skin: She is not diaphoretic.  Psychiatric: She has a normal mood and affect. Her behavior is normal.    ED Course  Procedures (including critical care time) Labs Review Labs Reviewed  COMPREHENSIVE METABOLIC PANEL - Abnormal; Notable for the following:    Sodium 136 (*)    Chloride 93 (*)    Glucose, Bld 156 (*)    BUN 55 (*)    Creatinine, Ser 6.85 (*)    Total Protein 8.6 (*)    GFR calc non Af Amer 6 (*)    GFR calc Af Amer 7 (*)    Anion gap 17 (*)    All other components within normal limits  CBC  I-STAT TROPOININ, ED    Imaging Review Dg Chest 2 View  07/24/2014    CLINICAL DATA:  Chest pain.  Headache.  EXAM: CHEST  2 VIEW  COMPARISON:  04/10/2013.  FINDINGS: LEFT IJ dialysis catheter is present with the distal tip in the RIGHT atrium. Enlargement of the cardiopericardial silhouette. No airspace disease. No pleural effusions are identified. Cholecystectomy clips are present in the right upper quadrant.  IMPRESSION: Cardiomegaly without failure.   Electronically Signed   By: Andreas Newport M.D.   On: 07/24/2014 13:17     EKG Interpretation   Date/Time:  Friday July 24 2014 12:20:10 EDT Ventricular Rate:  88 PR Interval:  156 QRS Duration: 74  QT Interval:  374 QTC Calculation: 452 R Axis:   5 Text Interpretation:  Normal sinus rhythm ST \\T \ T wave abnormality,  consider inferolateral ischemia Abnormal ECG worsening TWI inferior and  lateral  Confirmed by YAO  MD, DAVID ([id number]) on 07/24/2014 1:54:51 PM      MDM   Final diagnoses:  Other chest pain  ESRD (end stage renal disease)    Patient is a 58 year old female past medical history significant for diabetes, congestive heart failure, kidney disease, COPD that presents with chest pain and headache. Patient states that she recently started dialysis for her issues with congestive heart failure that she was 80 pounds over her dry weight. Patient states that she is now back to her dry weight. Patient dialyzes Monday Wednesday Friday and last dialyzed 2 days ago. Patient said that recently placed a PICC line for access. Patient says this morning she had a substernal chest pain that was sharp in nature and radiate up into her face and left neck. Patient denies any nausea or diaphoresis. On exam patient is afebrile stable vital signs. Patient has no signs of edema or volume overload. Patient's initial troponin is negative. Patient does have ischemic changes to her EKG however these changes were seen one year ago and they appear to be stable in nature. Patient had a cardiac catheterization at that time.  Given these findings cardiologist consult and evaluated the patient and they feel like the chest pain is secondary to her congestive heart failure and that she would benefit from being discharged and going directly to dialysis session. Patient was given very strict return precautions which he voiced understanding to. Patient we discharge at this time and go directly to dialysis.    Beverely Risen, MD 07/24/14 [phone]

## 2014-07-24 NOTE — Discharge Instructions (Signed)
Go to dialysis now to get dialysis.   Follow up with your doctor and cardiologist.   Return to ER if you have severe chest pain, shortness of breath, headache.

## 2014-07-25 NOTE — ED Provider Notes (Signed)
I saw and evaluated the patient, reviewed the resident's note and I agree with the findings and plan.   EKG Interpretation   Date/Time:  Friday July 24 2014 12:20:10 EDT Ventricular Rate:  88 PR Interval:  156 QRS Duration: 74 QT Interval:  374 QTC Calculation: 452 R Axis:   5 Text Interpretation:  Normal sinus rhythm ST \\T \ T wave abnormality,  consider inferolateral ischemia Abnormal ECG worsening TWI inferior and  lateral  Confirmed by Michoel Kunin  MD, Teryn Boerema ([id number]) on 07/24/2014 1:54:51 PM      Gail Chapman is a 57 y.o. female hx of DM, CHF, CKD, COPD, ESRD on HD here with chest pain. Chest pain today, radiate to left neck. Didn't go to dialysis today. Chronically ill on exam. Heart, lung,abdomen unremarkable. Labs stable. EKG showed worse TWI inferior and lateral leads. Cardiology consulted and Dr. Anne Fu saw patient and felt that changes are not acute and that she doesn't need further workup. He wants her to go to dialysis since she is hypertensive.    Richardean Canal, MD 07/25/14 [phone]

## 2014-08-28 ENCOUNTER — Emergency Department (HOSPITAL_COMMUNITY)
Admission: EM | Admit: 2014-08-28 | Discharge: 2014-08-28 | Disposition: A | Discharge status: Home / Self Care | Payer: BC Managed Care – PPO | Attending: Emergency Medicine | Admitting: Emergency Medicine

## 2014-08-28 ENCOUNTER — Emergency Department (HOSPITAL_COMMUNITY): Payer: BC Managed Care – PPO

## 2014-08-28 ENCOUNTER — Encounter (HOSPITAL_COMMUNITY): Payer: Self-pay | Admitting: Family Medicine

## 2014-08-28 DIAGNOSIS — I5032 Chronic diastolic (congestive) heart failure: Secondary | ICD-10-CM | POA: Diagnosis not present

## 2014-08-28 DIAGNOSIS — Z7982 Long term (current) use of aspirin: Secondary | ICD-10-CM | POA: Insufficient documentation

## 2014-08-28 DIAGNOSIS — I10 Essential (primary) hypertension: Secondary | ICD-10-CM | POA: Insufficient documentation

## 2014-08-28 DIAGNOSIS — Z9981 Dependence on supplemental oxygen: Secondary | ICD-10-CM | POA: Diagnosis not present

## 2014-08-28 DIAGNOSIS — Z87891 Personal history of nicotine dependence: Secondary | ICD-10-CM | POA: Diagnosis not present

## 2014-08-28 DIAGNOSIS — G4733 Obstructive sleep apnea (adult) (pediatric): Secondary | ICD-10-CM | POA: Diagnosis not present

## 2014-08-28 DIAGNOSIS — J449 Chronic obstructive pulmonary disease, unspecified: Secondary | ICD-10-CM | POA: Insufficient documentation

## 2014-08-28 DIAGNOSIS — Z87448 Personal history of other diseases of urinary system: Secondary | ICD-10-CM | POA: Diagnosis not present

## 2014-08-28 DIAGNOSIS — Z794 Long term (current) use of insulin: Secondary | ICD-10-CM | POA: Diagnosis not present

## 2014-08-28 DIAGNOSIS — Z79899 Other long term (current) drug therapy: Secondary | ICD-10-CM | POA: Insufficient documentation

## 2014-08-28 DIAGNOSIS — E119 Type 2 diabetes mellitus without complications: Secondary | ICD-10-CM | POA: Insufficient documentation

## 2014-08-28 LAB — CBC WITH DIFFERENTIAL/PLATELET
BASOS PCT: 0 % (ref 0–1)
Basophils Absolute: 0 10*3/uL (ref 0.0–0.1)
EOS PCT: 1 % (ref 0–5)
Eosinophils Absolute: 0.1 10*3/uL (ref 0.0–0.7)
HEMATOCRIT: 37.4 % (ref 36.0–46.0)
HEMOGLOBIN: 11.8 g/dL — AB (ref 12.0–15.0)
Lymphocytes Relative: 7 % — ABNORMAL LOW (ref 12–46)
Lymphs Abs: 0.7 10*3/uL (ref 0.7–4.0)
MCH: 27.1 pg (ref 26.0–34.0)
MCHC: 31.6 g/dL (ref 30.0–36.0)
MCV: 86 fL (ref 78.0–100.0)
MONO ABS: 1.1 10*3/uL — AB (ref 0.1–1.0)
MONOS PCT: 11 % (ref 3–12)
NEUTROS ABS: 8.3 10*3/uL — AB (ref 1.7–7.7)
Neutrophils Relative %: 81 % — ABNORMAL HIGH (ref 43–77)
Platelets: 244 10*3/uL (ref 150–400)
RBC: 4.35 MIL/uL (ref 3.87–5.11)
RDW: 16.4 % — ABNORMAL HIGH (ref 11.5–15.5)
WBC: 10.2 10*3/uL (ref 4.0–10.5)

## 2014-08-28 LAB — BASIC METABOLIC PANEL
Anion gap: 20 — ABNORMAL HIGH (ref 5–15)
BUN: 33 mg/dL — AB (ref 6–23)
CO2: 27 meq/L (ref 19–32)
CREATININE: 4.97 mg/dL — AB (ref 0.50–1.10)
Calcium: 9 mg/dL (ref 8.4–10.5)
Chloride: 93 mEq/L — ABNORMAL LOW (ref 96–112)
GFR calc non Af Amer: 9 mL/min — ABNORMAL LOW (ref >90)
GFR, EST AFRICAN AMERICAN: 10 mL/min — AB (ref >90)
Glucose, Bld: 96 mg/dL (ref 70–99)
Potassium: 3.9 mEq/L (ref 3.7–5.3)
Sodium: 140 mEq/L (ref 137–147)

## 2014-08-28 LAB — CBG MONITORING, ED: GLUCOSE-CAPILLARY: 133 mg/dL — AB (ref 70–99)

## 2014-08-28 MED ORDER — METOPROLOL TARTRATE 1 MG/ML IV SOLN
5.0000 mg | Freq: Once | INTRAVENOUS | Status: AC
  Administered 2014-08-28: 5 mg via INTRAVENOUS
  Filled 2014-08-28: qty 5

## 2014-08-28 MED ORDER — ONDANSETRON HCL 4 MG/2ML IJ SOLN
4.0000 mg | Freq: Once | INTRAMUSCULAR | Status: AC
  Administered 2014-08-28: 4 mg via INTRAVENOUS
  Filled 2014-08-28: qty 2

## 2014-08-28 MED ORDER — MINOXIDIL 2.5 MG PO TABS
5.0000 mg | ORAL_TABLET | Freq: Every day | ORAL | Status: DC
  Administered 2014-08-28: 5 mg via ORAL
  Filled 2014-08-28 (×2): qty 2

## 2014-08-28 NOTE — ED Notes (Signed)
Pt reports she was at dialysis today when staff said her BP was 220/130 - pt denies any symptoms, denies HA, CP, SOB. Pt's dialysis session was stopped an hour early d/t the hypertension. Pt admits to nausea at present however states she always experiences n/v s/p dialysis treatments. Pt is A&Ox4 - no acute distress on assessment.

## 2014-08-28 NOTE — Discharge Instructions (Signed)

## 2014-08-28 NOTE — ED Notes (Signed)
Pt sent here from [facility] with hypertension. sts she was at dialysis and the BP was taken during the time. Pt currently 144/66 at triage

## 2014-08-28 NOTE — ED Notes (Signed)
D/c instructions reviewed w/ pt and family - pt and family deny any further questions or concerns at present. Pt assisted to vehicle via wheelchair by this RN.

## 2014-08-28 NOTE — ED Provider Notes (Signed)
CSN: [id number]     Arrival date & time 08/28/14  1832 History   First MD Initiated Contact with Patient 08/28/14 1920     Chief Complaint  Patient presents with  . Hypertension      HPI  Patient presents for evaluation of hypertension. She was at dialysis today.  Blood pressure 220/130. She finished 2 hours of her dialysis. Complains of nausea. States is typical after her dialysis. She is asymptomatic. Straight chronic compensated congestive heart failure and hypertension. Denies being noncompliant with medications. Has not taken her p.m. doses yet  Past Medical History  Diagnosis Date  . Diabetes mellitus without complication   . Chronic diastolic CHF (congestive heart failure)     a. 01/2007 MV: No isch/infarct, nl EF;  b. 2012 Cath: "normal" per patient.  Performed in [place], [place] by Dr. Graciela Husbands.  . Pulmonary hypertension     a. on home O2 @ 3lpm 24hrs/day  . HTN (hypertension)     a. Dx in 1993.  Marland Kitchen LVH (left ventricular hypertrophy)   . OSA (obstructive sleep apnea)     a. uses CPAP  . Physical deconditioning   . Renal disorder   . COPD (chronic obstructive pulmonary disease)    Past Surgical History  Procedure Laterality Date  . Knee surgery    . Carpal tunnel release    . Tubal ligation    . Cholecystectomy    . Mass excision      a. under axilla.  . Subxyphoid pericardial window N/A 02/26/2013    Procedure: SUBXYPHOID PERICARDIAL WINDOW;  Surgeon: Delight Ovens, MD;  Location: [facility] OR;  Service: Thoracic;  Laterality: N/A;   Family History  Problem Relation Age of Onset  . Diabetes Mother   . CAD Mother   . Cancer Mother   . Hypertension Mother   . Cancer Father   . Diabetes Father   . Hyperlipidemia Father   . Hypertension Father   . Heart attack Father    History  Substance Use Topics  . Smoking status: Former Games developer  . Smokeless tobacco: Not on file  . Alcohol Use: No   OB History    No data available     Review of Systems  Constitutional:  Negative for fever, chills, diaphoresis, appetite change and fatigue.  HENT: Negative for mouth sores, sore throat and trouble swallowing.   Eyes: Negative for visual disturbance.  Respiratory: Negative for cough, chest tightness, shortness of breath and wheezing.        Baseline dyspnea. No frank PND or orthopnea. No chest pain today.  Cardiovascular: Negative for chest pain.  Gastrointestinal: Negative for nausea, vomiting, abdominal pain, diarrhea and abdominal distention.  Endocrine: Negative for polydipsia, polyphagia and polyuria.  Genitourinary: Negative for dysuria, frequency and hematuria.  Musculoskeletal: Negative for gait problem.  Skin: Negative for color change, pallor and rash.  Neurological: Negative for dizziness, syncope, light-headedness and headaches.  Hematological: Does not bruise/bleed easily.  Psychiatric/Behavioral: Negative for behavioral problems and confusion.      Allergies  Clonidine derivatives; Hydralazine; Labetalol; and Vancomycin  Home Medications   Prior to Admission medications   Medication Sig Start Date End Date Taking? Authorizing Provider  aspirin EC 325 MG EC tablet Take 1 tablet (325 mg total) by mouth daily. 03/06/13  Yes Amy D Clegg, NP  carvedilol (COREG) 12.5 MG tablet Take 1 tablet (12.5 mg total) by mouth 2 (two) times daily with a meal. On takes on non dialysis day. 11/24/13  Yes Amy D Clegg, NP  doxazosin (CARDURA) 2 MG tablet Take 1.5 tablets (3 mg total) by mouth at bedtime. 11/24/13  Yes Amy D Clegg, NP  insulin aspart (NOVOLOG) 100 UNIT/ML injection Inject 10 Units into the skin 3 (three) times daily with meals. Per sliding scale 04/23/14  Yes Dolores Patty, MD  insulin detemir (LEVEMIR) 100 UNIT/ML injection Inject 0.04 mLs (4 Units total) into the skin at bedtime. Hold if CBG<150 04/23/14  Yes Dolores Patty, MD  minoxidil (LONITEN) 10 MG tablet Take 0.5 tablets (5 mg total) by mouth 3 (three) times daily. 07/14/14  Yes Amy D  Clegg, NP  pravastatin (PRAVACHOL) 40 MG tablet Take 1 tablet (40 mg total) by mouth daily. 07/14/14  Yes Amy D Clegg, NP  promethazine (PHENERGAN) 25 MG suppository Place 1 suppository (25 mg total) rectally at bedtime. Patient taking differently: Place 25 mg rectally every 6 (six) hours as needed for nausea.  03/06/13  Yes Amy D Clegg, NP  glucose blood (FREESTYLE LITE) test strip Use as instructed 05/04/14   Dolores Patty, MD   BP 124/40 mmHg  Pulse 95  Temp(Src) 98.1 F (36.7 C) (Oral)  Resp 14  Ht 5' 1.5" (1.562 m)  Wt 169 lb (76.658 kg)  BMI 31.42 kg/m2  SpO2 98% Physical Exam  Constitutional: She is oriented to person, place, and time. She appears well-developed and well-nourished. No distress.  HENT:  Head: Normocephalic.  Eyes: Conjunctivae are normal. Pupils are equal, round, and reactive to light. No scleral icterus.  Neck: Normal range of motion. Neck supple. No thyromegaly present.  Cardiovascular: Normal rate and regular rhythm.  Exam reveals no gallop and no friction rub.   No murmur heard. Pulmonary/Chest: Effort normal and breath sounds normal. No respiratory distress. She has no wheezes. She has no rales.  Clear Bilateral breath sounds.  Abdominal: Soft. Bowel sounds are normal. She exhibits no distension. There is no tenderness. There is no rebound.  Musculoskeletal: Normal range of motion.  Neurological: She is alert and oriented to person, place, and time.  Skin: Skin is warm and dry. No rash noted.  Psychiatric: She has a normal mood and affect. Her behavior is normal.   shut with normal thrill and bruit.  ED Course  Procedures (including critical care time) Labs Review Labs Reviewed  CBC WITH DIFFERENTIAL - Abnormal; Notable for the following:    Hemoglobin 11.8 (*)    RDW 16.4 (*)    Neutrophils Relative % 81 (*)    Neutro Abs 8.3 (*)    Lymphocytes Relative 7 (*)    Monocytes Absolute 1.1 (*)    All other components within normal limits  BASIC  METABOLIC PANEL - Abnormal; Notable for the following:    Chloride 93 (*)    BUN 33 (*)    Creatinine, Ser 4.97 (*)    GFR calc non Af Amer 9 (*)    GFR calc Af Amer 10 (*)    Anion gap 20 (*)    All other components within normal limits  CBG MONITORING, ED - Abnormal; Notable for the following:    Glucose-Capillary 133 (*)    All other components within normal limits    Imaging Review Dg Chest Port 1 View  08/28/2014   CLINICAL DATA:  Hypertension.  EXAM: PORTABLE CHEST - 1 VIEW  COMPARISON:  07/24/2014  FINDINGS: Left dialysis catheter remains in place, unchanged. Cardiomegaly. No confluent opacities, effusions or edema. No acute bony abnormality.  IMPRESSION: Stable cardiomegaly.  No evidence of failure.   Electronically Signed   By: Charlett NoseKevin  Dover M.D.   On: 08/28/2014 20:08     EKG Interpretation None      MDM   Final diagnoses:  Hypertension    Medicated. Chest x-ray and labs show no concerning changes from her baseline.  No frank CHF. Potassium 3.9. Blood pressure well controlled. Plan is discharge home continue home meds.    Rolland PorterMark Rubena Roseman, MD 08/28/14 [phone]

## 2014-09-17 ENCOUNTER — Encounter (HOSPITAL_COMMUNITY): Payer: Self-pay | Admitting: Internal Medicine

## 2014-09-23 ENCOUNTER — Telehealth (HOSPITAL_COMMUNITY): Payer: Self-pay | Admitting: Vascular Surgery

## 2014-09-23 NOTE — Telephone Encounter (Signed)
Left message to call back  

## 2014-09-23 NOTE — Telephone Encounter (Signed)
Pt called she said her cath in her chest is infected... Please advise

## 2014-09-25 NOTE — Telephone Encounter (Signed)
Left message to call back  

## 2014-09-28 NOTE — Telephone Encounter (Signed)
Still have not heard back from pt, Left message to call back

## 2014-10-13 ENCOUNTER — Other Ambulatory Visit (HOSPITAL_COMMUNITY): Payer: Self-pay | Admitting: Cardiology

## 2014-10-13 MED ORDER — PRAVASTATIN SODIUM 40 MG PO TABS: 40.0000 mg | ORAL_TABLET | Freq: Every day | ORAL | Status: DC

## 2015-02-08 ENCOUNTER — Other Ambulatory Visit (HOSPITAL_COMMUNITY): Payer: Self-pay | Admitting: Internal Medicine

## 2015-02-08 NOTE — Telephone Encounter (Signed)
Recall? 

## 2015-02-09 ENCOUNTER — Other Ambulatory Visit (HOSPITAL_COMMUNITY): Payer: Self-pay | Admitting: Internal Medicine

## 2015-02-10 NOTE — Telephone Encounter (Signed)
Pt called to request refill on insulin Pt has been told multiple times she should establish with PCP/endocrinology  Pt states she has not called to establish, would like Dr.Bensimhon to refill Pt states she is still going to dialysis in [place]   Please advise

## 2015-03-30 ENCOUNTER — Telehealth (HOSPITAL_COMMUNITY): Payer: Self-pay | Admitting: Vascular Surgery

## 2015-03-30 NOTE — Telephone Encounter (Signed)
Pt called she want Dr.Bensimhon to set her up do get a blood transfusion.. Please advise

## 2015-04-01 ENCOUNTER — Telehealth (HOSPITAL_COMMUNITY): Payer: Self-pay | Admitting: Vascular Surgery

## 2015-04-01 NOTE — Telephone Encounter (Signed)
Left pt message tod make appt

## 2015-04-02 ENCOUNTER — Encounter (HOSPITAL_COMMUNITY): Payer: Self-pay | Admitting: Vascular Surgery

## 2015-04-02 ENCOUNTER — Telehealth (HOSPITAL_COMMUNITY): Payer: Self-pay | Admitting: Vascular Surgery

## 2015-04-02 NOTE — Telephone Encounter (Signed)
Left pt message to give [place] a call to make a echo appt and MD APPT.. WILL SEND PT A LETTER

## 2015-04-05 ENCOUNTER — Other Ambulatory Visit: Payer: Self-pay

## 2015-05-05 ENCOUNTER — Encounter (HOSPITAL_COMMUNITY): Payer: Self-pay

## 2015-05-05 ENCOUNTER — Ambulatory Visit (HOSPITAL_BASED_OUTPATIENT_CLINIC_OR_DEPARTMENT_OTHER)
Admission: RE | Admit: 2015-05-05 | Discharge: 2015-05-05 | Disposition: A | Discharge status: Home / Self Care | Payer: Medicare Other | Source: Ambulatory Visit | Attending: Internal Medicine | Admitting: Internal Medicine

## 2015-05-05 ENCOUNTER — Other Ambulatory Visit (HOSPITAL_COMMUNITY): Payer: Self-pay | Admitting: Adult Health

## 2015-05-05 ENCOUNTER — Ambulatory Visit (HOSPITAL_COMMUNITY)
Admission: RE | Admit: 2015-05-05 | Discharge: 2015-05-05 | Disposition: A | Discharge status: Home / Self Care | Payer: Medicare Other | Source: Ambulatory Visit | Attending: Internal Medicine | Admitting: Internal Medicine

## 2015-05-05 VITALS — BP 158/78 | HR 82 | Wt 165.0 lb

## 2015-05-05 DIAGNOSIS — I5032 Chronic diastolic (congestive) heart failure: Secondary | ICD-10-CM | POA: Diagnosis not present

## 2015-05-05 DIAGNOSIS — N189 Chronic kidney disease, unspecified: Secondary | ICD-10-CM

## 2015-05-05 DIAGNOSIS — N186 End stage renal disease: Secondary | ICD-10-CM

## 2015-05-05 DIAGNOSIS — I351 Nonrheumatic aortic (valve) insufficiency: Secondary | ICD-10-CM | POA: Diagnosis not present

## 2015-05-05 DIAGNOSIS — I319 Disease of pericardium, unspecified: Secondary | ICD-10-CM

## 2015-05-05 DIAGNOSIS — I509 Heart failure, unspecified: Secondary | ICD-10-CM | POA: Diagnosis present

## 2015-05-05 DIAGNOSIS — N179 Acute kidney failure, unspecified: Secondary | ICD-10-CM | POA: Diagnosis not present

## 2015-05-05 DIAGNOSIS — E119 Type 2 diabetes mellitus without complications: Secondary | ICD-10-CM | POA: Insufficient documentation

## 2015-05-05 DIAGNOSIS — I050 Rheumatic mitral stenosis: Secondary | ICD-10-CM | POA: Insufficient documentation

## 2015-05-05 DIAGNOSIS — I10 Essential (primary) hypertension: Secondary | ICD-10-CM | POA: Diagnosis not present

## 2015-05-05 DIAGNOSIS — I071 Rheumatic tricuspid insufficiency: Secondary | ICD-10-CM | POA: Insufficient documentation

## 2015-05-05 DIAGNOSIS — I340 Nonrheumatic mitral (valve) insufficiency: Secondary | ICD-10-CM | POA: Diagnosis not present

## 2015-05-05 DIAGNOSIS — I3139 Other pericardial effusion (noninflammatory): Secondary | ICD-10-CM | POA: Insufficient documentation

## 2015-05-05 DIAGNOSIS — I313 Pericardial effusion (noninflammatory): Secondary | ICD-10-CM | POA: Insufficient documentation

## 2015-05-05 DIAGNOSIS — I270 Primary pulmonary hypertension: Secondary | ICD-10-CM

## 2015-05-05 DIAGNOSIS — I272 Pulmonary hypertension, unspecified: Secondary | ICD-10-CM

## 2015-05-05 MED ORDER — MINOXIDIL 10 MG PO TABS: 5.0000 mg | ORAL_TABLET | Freq: Two times a day (BID) | ORAL | Status: DC

## 2015-05-05 MED ORDER — DOXAZOSIN MESYLATE 2 MG PO TABS: 2.0000 mg | ORAL_TABLET | Freq: Every day | ORAL | Status: DC

## 2015-05-05 NOTE — Patient Instructions (Signed)
DECREASE Minoxidil to 1/2 tab, twice a day DECREASE Cardura to  daily  Your physician recommends that you schedule a follow-up appointment in: 2 months with a echo  Your physician has requested that you have an echocardiogram. Echocardiography is a painless test that uses sound waves to create images of your heart. It provides your doctor with information about the size and shape of your heart and how well your heart's chambers and valves are working. This procedure takes approximately one hour. There are no restrictions for this procedure.  Do the following things EVERYDAY: 1) Weigh yourself in the morning before breakfast. Write it down and keep it in a log. 2) Take your medicines as prescribed 3) Eat low salt foods-Limit salt (sodium) to 2000 mg per day.  4) Stay as active as you can everyday 5) Limit all fluids for the day to less than 2 liters 6)

## 2015-05-05 NOTE — Progress Notes (Signed)
  Echocardiogram 2D Echocardiogram has been performed.  Gail Chapman 05/05/2015, 3:04 PM

## 2015-05-05 NOTE — Progress Notes (Addendum)
Patient ID: Mee Hives, female   DOB: 06-08-1956, 59 y.o.   MRN: [id number]   [facility] No PCP  HPI: Ms. Veazie is a very pleasant 59 y/o woman from [place] with a h/o severe HTN, DM2, diastolic heart failure, MS, and ESRD.   She presented to [facility] on [facility]/19/2013 with decompensated HF and marked volume overload in the setting of severe HTN with a BP of 260/130. Her hospitalization was complicated by progressive renal failure, VDRF and a large pericardial effusion requiring pericardial window. She also underwent fat pad and myocardial biopsy to exclude amyloid. Discharged 03/06/13 with HD to start 5/31 at [facility] Tuesday/Thursday, and Saturday.   Had RHC in May but no coronary angio. Myoview 2008 with no ischemia.   She returns for follow up with her husband. Says she hasn't been feeling well lately. SOB with mild activity. Says BP is very labile ranging 150-210. Has been having a hard time tolerating HD. Now only going 1x/week and nephrologist frustrated with her. Says it makes her sick was previously on PD but couldn't see well enough to do the dials. Weight up from 153-> 165.  Still making urine 3-4 times a day. Uses a walker to ambulated.   Echo today with EF 55-60% Severe LVH moderate posterior pericardial effusion.RSVP estimated ~90-100. mild RV HK  ROS: All systems negative except as listed in HPI, PMH and Problem List.  Past Medical History  Diagnosis Date  . Diabetes mellitus without complication   . Chronic diastolic CHF (congestive heart failure)     a. 01/2007 MV: No isch/infarct, nl EF;  b. 2012 Cath: "normal" per patient.  Performed in [place], [place] by Dr. Graciela Husbands.  . Pulmonary hypertension     a. on home O2 @ 3lpm 24hrs/day  . HTN (hypertension)     a. Dx in 1993.  Marland Kitchen LVH (left ventricular hypertrophy)   . OSA (obstructive sleep apnea)     a. uses CPAP  . Physical deconditioning   . Renal disorder   . COPD (chronic obstructive pulmonary  disease)     Current Outpatient Prescriptions  Medication Sig Dispense Refill  . insulin detemir (LEVEMIR) 100 UNIT/ML injection Inject 0.04 mLs (4 Units total) into the skin at bedtime. Hold if CBG<150 10 mL 0  . minoxidil (LONITEN) 10 MG tablet Take 0.5 tablets (5 mg total) by mouth 3 (three) times daily. 60 tablet 6  . pravastatin (PRAVACHOL) 40 MG tablet Take 1 tablet (40 mg total) by mouth daily. 30 tablet 6  . sevelamer carbonate (RENVELA) 800 MG tablet Take 800 mg by mouth 3 (three) times daily with meals.    Marland Kitchen aspirin EC 325 MG EC tablet Take 1 tablet (325 mg total) by mouth daily. (Patient not taking: Reported on 05/05/2015) 30 tablet 3  . carvedilol (COREG) 12.5 MG tablet Take 1 tablet (12.5 mg total) by mouth 2 (two) times daily with a meal. On takes on non dialysis day. (Patient not taking: Reported on 05/05/2015) 60 tablet 3  . doxazosin (CARDURA) 2 MG tablet Take 1.5 tablets (3 mg total) by mouth at bedtime. (Patient not taking: Reported on 05/05/2015) 45 tablet 6  . glucose blood (FREESTYLE LITE) test strip Use as instructed (Patient not taking: Reported on 05/05/2015) 100 each 12  . insulin aspart (NOVOLOG) 100 UNIT/ML injection Inject 10 Units into the skin 3 (three) times daily with meals. Per sliding scale (Patient not taking: Reported on 05/05/2015) 10 mL 0  .  promethazine (PHENERGAN) 25 MG suppository Place 1 suppository (25 mg total) rectally at bedtime. (Patient not taking: Reported on 05/05/2015) 12 each 3   No current facility-administered medications for this encounter.     PHYSICAL EXAM: Filed Vitals:   05/05/15 1459  BP: 158/78  Pulse: 82  Weight: 165 lb (74.844 kg)  SpO2: 92%    General:  Sitting in WC  No resp difficulty Husband present  HEENT: normal Neck: supple. JVP 7-8. Carotids 2+ bilaterally; no bruits. No lymphadenopathy or thryomegaly appreciated. Cor: PMI normal. Regular rate & rhythm. +s4 2/6 TR  Perm cath in place in Left chest  Lungs: Clear.   Abdomen: soft, obese. nontender, distended. No hepatosplenomegaly. No bruits or masses. Good bowel sounds. Extremities: no cyanosis, clubbing, rash, 2+ edema. Slightly weak in R leg  Neuro: alert & orientedx3, cranial nerves grossly intact. Moves all 4 extremities w/o difficulty. Affect pleasant    ASSESSMENT & PLAN:  1. Chronic Diastolic Heart Failure 2. Chronic renal failure, end-stage 3. HTN, severe 4. Pericardial effusion 5. PHTN, secondary  She is mildly volume overloaded in the setting of noncompliance with HD. I strongly suggested greater compliance but she says it makes her sick. She tolerated PD much better but her husband says it is not an option because she can't see the dials well enough and there is no one to help her. I will leave it up to her kidney doctors to decide whether she should go back on some diuretic as she still makes urine.   I reviewed her echo and EF is normal but she has moderate pericardial effusion without hemodynamic compromise. There is also PHTN which is likely secondary to OSA and markedly elevated left sided pressures. Suspect pericardial effusion is due to lack of adequate HD as well as minoxidil and PHTN. Will cut minoxidil down to bid (hard to stop completely as it is the only thing that controlled her BP). Will add back cardura  daily and wean minoxidil as tolerated. Will see back in 2 months with repeat echo and to discuss further w/u of PHTN. Stressed need to get PCP.  Total time spent 45 minutes. Over half that time spent discussing above.   Bensimhon, Daniel,MD 3:10 PM

## 2015-05-06 ENCOUNTER — Other Ambulatory Visit (HOSPITAL_COMMUNITY): Payer: Self-pay | Admitting: Internal Medicine

## 2015-07-12 ENCOUNTER — Telehealth (HOSPITAL_COMMUNITY): Payer: Self-pay | Admitting: *Deleted

## 2015-07-12 ENCOUNTER — Inpatient Hospital Stay (HOSPITAL_COMMUNITY)
Admission: AD | Admit: 2015-07-12 | Discharge: 2015-07-26 | DRG: 064 | Disposition: A | Discharge status: Rehabilitation Facility | Payer: Medicare Other | Source: Other Acute Inpatient Hospital | Attending: Internal Medicine | Admitting: Internal Medicine

## 2015-07-12 DIAGNOSIS — K3184 Gastroparesis: Secondary | ICD-10-CM | POA: Diagnosis present

## 2015-07-12 DIAGNOSIS — D649 Anemia, unspecified: Secondary | ICD-10-CM | POA: Diagnosis present

## 2015-07-12 DIAGNOSIS — B9620 Unspecified Escherichia coli [E. coli] as the cause of diseases classified elsewhere: Secondary | ICD-10-CM | POA: Diagnosis not present

## 2015-07-12 DIAGNOSIS — Z8249 Family history of ischemic heart disease and other diseases of the circulatory system: Secondary | ICD-10-CM

## 2015-07-12 DIAGNOSIS — N2581 Secondary hyperparathyroidism of renal origin: Secondary | ICD-10-CM | POA: Diagnosis present

## 2015-07-12 DIAGNOSIS — J42 Unspecified chronic bronchitis: Secondary | ICD-10-CM | POA: Diagnosis not present

## 2015-07-12 DIAGNOSIS — I472 Ventricular tachycardia: Secondary | ICD-10-CM | POA: Diagnosis not present

## 2015-07-12 DIAGNOSIS — J438 Other emphysema: Secondary | ICD-10-CM | POA: Diagnosis not present

## 2015-07-12 DIAGNOSIS — Z9981 Dependence on supplemental oxygen: Secondary | ICD-10-CM

## 2015-07-12 DIAGNOSIS — I69319 Unspecified symptoms and signs involving cognitive functions following cerebral infarction: Secondary | ICD-10-CM | POA: Diagnosis not present

## 2015-07-12 DIAGNOSIS — Z993 Dependence on wheelchair: Secondary | ICD-10-CM

## 2015-07-12 DIAGNOSIS — N390 Urinary tract infection, site not specified: Secondary | ICD-10-CM | POA: Diagnosis not present

## 2015-07-12 DIAGNOSIS — R778 Other specified abnormalities of plasma proteins: Secondary | ICD-10-CM | POA: Diagnosis present

## 2015-07-12 DIAGNOSIS — R269 Unspecified abnormalities of gait and mobility: Secondary | ICD-10-CM

## 2015-07-12 DIAGNOSIS — I69398 Other sequelae of cerebral infarction: Secondary | ICD-10-CM | POA: Diagnosis not present

## 2015-07-12 DIAGNOSIS — T827XXA Infection and inflammatory reaction due to other cardiac and vascular devices, implants and grafts, initial encounter: Secondary | ICD-10-CM | POA: Diagnosis not present

## 2015-07-12 DIAGNOSIS — R410 Disorientation, unspecified: Secondary | ICD-10-CM | POA: Diagnosis not present

## 2015-07-12 DIAGNOSIS — G8194 Hemiplegia, unspecified affecting left nondominant side: Secondary | ICD-10-CM | POA: Diagnosis not present

## 2015-07-12 DIAGNOSIS — R4182 Altered mental status, unspecified: Secondary | ICD-10-CM

## 2015-07-12 DIAGNOSIS — E1143 Type 2 diabetes mellitus with diabetic autonomic (poly)neuropathy: Secondary | ICD-10-CM | POA: Diagnosis present

## 2015-07-12 DIAGNOSIS — Z881 Allergy status to other antibiotic agents status: Secondary | ICD-10-CM

## 2015-07-12 DIAGNOSIS — E1122 Type 2 diabetes mellitus with diabetic chronic kidney disease: Secondary | ICD-10-CM | POA: Diagnosis present

## 2015-07-12 DIAGNOSIS — Z888 Allergy status to other drugs, medicaments and biological substances status: Secondary | ICD-10-CM

## 2015-07-12 DIAGNOSIS — I63139 Cerebral infarction due to embolism of unspecified carotid artery: Secondary | ICD-10-CM | POA: Diagnosis not present

## 2015-07-12 DIAGNOSIS — I674 Hypertensive encephalopathy: Secondary | ICD-10-CM | POA: Diagnosis present

## 2015-07-12 DIAGNOSIS — G4733 Obstructive sleep apnea (adult) (pediatric): Secondary | ICD-10-CM | POA: Diagnosis present

## 2015-07-12 DIAGNOSIS — G9340 Encephalopathy, unspecified: Secondary | ICD-10-CM | POA: Diagnosis present

## 2015-07-12 DIAGNOSIS — R401 Stupor: Secondary | ICD-10-CM | POA: Diagnosis not present

## 2015-07-12 DIAGNOSIS — I6330 Cerebral infarction due to thrombosis of unspecified cerebral artery: Secondary | ICD-10-CM | POA: Diagnosis not present

## 2015-07-12 DIAGNOSIS — I272 Other secondary pulmonary hypertension: Secondary | ICD-10-CM | POA: Diagnosis present

## 2015-07-12 DIAGNOSIS — A4151 Sepsis due to Escherichia coli [E. coli]: Secondary | ICD-10-CM | POA: Diagnosis present

## 2015-07-12 DIAGNOSIS — E119 Type 2 diabetes mellitus without complications: Secondary | ICD-10-CM

## 2015-07-12 DIAGNOSIS — I6340 Cerebral infarction due to embolism of unspecified cerebral artery: Secondary | ICD-10-CM | POA: Diagnosis present

## 2015-07-12 DIAGNOSIS — I63443 Cerebral infarction due to embolism of bilateral cerebellar arteries: Secondary | ICD-10-CM | POA: Diagnosis not present

## 2015-07-12 DIAGNOSIS — I10 Essential (primary) hypertension: Secondary | ICD-10-CM | POA: Diagnosis not present

## 2015-07-12 DIAGNOSIS — I161 Hypertensive emergency: Secondary | ICD-10-CM | POA: Diagnosis present

## 2015-07-12 DIAGNOSIS — I639 Cerebral infarction, unspecified: Secondary | ICD-10-CM | POA: Insufficient documentation

## 2015-07-12 DIAGNOSIS — I69354 Hemiplegia and hemiparesis following cerebral infarction affecting left non-dominant side: Secondary | ICD-10-CM | POA: Diagnosis present

## 2015-07-12 DIAGNOSIS — Z79899 Other long term (current) drug therapy: Secondary | ICD-10-CM

## 2015-07-12 DIAGNOSIS — Z992 Dependence on renal dialysis: Secondary | ICD-10-CM

## 2015-07-12 DIAGNOSIS — Y848 Other medical procedures as the cause of abnormal reaction of the patient, or of later complication, without mention of misadventure at the time of the procedure: Secondary | ICD-10-CM | POA: Diagnosis not present

## 2015-07-12 DIAGNOSIS — Z833 Family history of diabetes mellitus: Secondary | ICD-10-CM

## 2015-07-12 DIAGNOSIS — R7989 Other specified abnormal findings of blood chemistry: Secondary | ICD-10-CM | POA: Diagnosis not present

## 2015-07-12 DIAGNOSIS — E869 Volume depletion, unspecified: Secondary | ICD-10-CM

## 2015-07-12 DIAGNOSIS — I132 Hypertensive heart and chronic kidney disease with heart failure and with stage 5 chronic kidney disease, or end stage renal disease: Secondary | ICD-10-CM | POA: Diagnosis present

## 2015-07-12 DIAGNOSIS — I340 Nonrheumatic mitral (valve) insufficiency: Secondary | ICD-10-CM | POA: Diagnosis present

## 2015-07-12 DIAGNOSIS — I5033 Acute on chronic diastolic (congestive) heart failure: Secondary | ICD-10-CM | POA: Diagnosis present

## 2015-07-12 DIAGNOSIS — E785 Hyperlipidemia, unspecified: Secondary | ICD-10-CM | POA: Diagnosis present

## 2015-07-12 DIAGNOSIS — Z7982 Long term (current) use of aspirin: Secondary | ICD-10-CM

## 2015-07-12 DIAGNOSIS — R569 Unspecified convulsions: Secondary | ICD-10-CM

## 2015-07-12 DIAGNOSIS — I5032 Chronic diastolic (congestive) heart failure: Secondary | ICD-10-CM | POA: Diagnosis present

## 2015-07-12 DIAGNOSIS — I959 Hypotension, unspecified: Secondary | ICD-10-CM | POA: Diagnosis not present

## 2015-07-12 DIAGNOSIS — I313 Pericardial effusion (noninflammatory): Secondary | ICD-10-CM | POA: Diagnosis present

## 2015-07-12 DIAGNOSIS — R079 Chest pain, unspecified: Secondary | ICD-10-CM | POA: Diagnosis not present

## 2015-07-12 DIAGNOSIS — G40909 Epilepsy, unspecified, not intractable, without status epilepticus: Secondary | ICD-10-CM | POA: Diagnosis present

## 2015-07-12 DIAGNOSIS — N186 End stage renal disease: Secondary | ICD-10-CM | POA: Diagnosis present

## 2015-07-12 DIAGNOSIS — J449 Chronic obstructive pulmonary disease, unspecified: Secondary | ICD-10-CM | POA: Diagnosis present

## 2015-07-12 DIAGNOSIS — I6349 Cerebral infarction due to embolism of other cerebral artery: Secondary | ICD-10-CM | POA: Diagnosis not present

## 2015-07-12 DIAGNOSIS — Z794 Long term (current) use of insulin: Secondary | ICD-10-CM

## 2015-07-12 DIAGNOSIS — D638 Anemia in other chronic diseases classified elsewhere: Secondary | ICD-10-CM | POA: Diagnosis present

## 2015-07-12 DIAGNOSIS — I63133 Cerebral infarction due to embolism of bilateral carotid arteries: Secondary | ICD-10-CM | POA: Diagnosis not present

## 2015-07-12 DIAGNOSIS — I63119 Cerebral infarction due to embolism of unspecified vertebral artery: Secondary | ICD-10-CM | POA: Diagnosis not present

## 2015-07-12 DIAGNOSIS — D696 Thrombocytopenia, unspecified: Secondary | ICD-10-CM | POA: Diagnosis not present

## 2015-07-12 DIAGNOSIS — Z87891 Personal history of nicotine dependence: Secondary | ICD-10-CM

## 2015-07-12 DIAGNOSIS — G35 Multiple sclerosis: Secondary | ICD-10-CM | POA: Diagnosis present

## 2015-07-12 DIAGNOSIS — R4189 Other symptoms and signs involving cognitive functions and awareness: Secondary | ICD-10-CM | POA: Diagnosis present

## 2015-07-12 DIAGNOSIS — Y92230 Patient room in hospital as the place of occurrence of the external cause: Secondary | ICD-10-CM | POA: Diagnosis not present

## 2015-07-12 DIAGNOSIS — G8222 Paraplegia, incomplete: Secondary | ICD-10-CM | POA: Diagnosis not present

## 2015-07-12 LAB — GLUCOSE, CAPILLARY: GLUCOSE-CAPILLARY: 94 mg/dL (ref 65–99)

## 2015-07-12 LAB — MRSA PCR SCREENING: MRSA BY PCR: NEGATIVE

## 2015-07-12 MED ORDER — HEPARIN SODIUM (PORCINE) 5000 UNIT/ML IJ SOLN
5000.0000 [IU] | Freq: Three times a day (TID) | INTRAMUSCULAR | Status: DC
  Administered 2015-07-12 – 2015-07-26 (×39): 5000 [IU] via SUBCUTANEOUS
  Filled 2015-07-12 (×32): qty 1

## 2015-07-12 MED ORDER — INSULIN GLARGINE 100 UNIT/ML ~~LOC~~ SOLN
3.0000 [IU] | Freq: Every day | SUBCUTANEOUS | Status: DC
  Administered 2015-07-15: 3 [IU] via SUBCUTANEOUS
  Filled 2015-07-12 (×14): qty 0.03

## 2015-07-12 MED ORDER — LISINOPRIL 5 MG PO TABS
5.0000 mg | ORAL_TABLET | Freq: Every day | ORAL | Status: DC
  Administered 2015-07-12: 5 mg via ORAL
  Filled 2015-07-12 (×2): qty 1

## 2015-07-12 MED ORDER — METOPROLOL TARTRATE 1 MG/ML IV SOLN
5.0000 mg | INTRAVENOUS | Status: DC | PRN
  Administered 2015-07-12 – 2015-07-13 (×3): 5 mg via INTRAVENOUS
  Filled 2015-07-12 (×3): qty 5

## 2015-07-12 MED ORDER — SODIUM CHLORIDE 0.9 % IV SOLN
INTRAVENOUS | Status: AC
  Administered 2015-07-12: 22:00:00 via INTRAVENOUS

## 2015-07-12 MED ORDER — ONDANSETRON HCL 4 MG/2ML IJ SOLN
4.0000 mg | Freq: Four times a day (QID) | INTRAMUSCULAR | Status: DC | PRN
  Administered 2015-07-14 – 2015-07-20 (×5): 4 mg via INTRAVENOUS
  Filled 2015-07-12 (×6): qty 2

## 2015-07-12 MED ORDER — LORAZEPAM 2 MG/ML IJ SOLN: 0.5000 mg | INTRAMUSCULAR | Status: DC | PRN

## 2015-07-12 MED ORDER — ACETAMINOPHEN 650 MG RE SUPP: 650.0000 mg | Freq: Four times a day (QID) | RECTAL | Status: DC | PRN

## 2015-07-12 MED ORDER — ONDANSETRON HCL 4 MG PO TABS: 4.0000 mg | ORAL_TABLET | Freq: Four times a day (QID) | ORAL | Status: DC | PRN

## 2015-07-12 MED ORDER — DOXAZOSIN MESYLATE 2 MG PO TABS
2.0000 mg | ORAL_TABLET | Freq: Every day | ORAL | Status: DC
  Administered 2015-07-13: 2 mg via ORAL
  Filled 2015-07-12 (×2): qty 1

## 2015-07-12 MED ORDER — ACETAMINOPHEN 325 MG PO TABS
650.0000 mg | ORAL_TABLET | Freq: Four times a day (QID) | ORAL | Status: DC | PRN
  Administered 2015-07-21: 650 mg via ORAL
  Filled 2015-07-12: qty 2

## 2015-07-12 MED ORDER — CARVEDILOL 25 MG PO TABS
25.0000 mg | ORAL_TABLET | Freq: Two times a day (BID) | ORAL | Status: DC
  Administered 2015-07-12 – 2015-07-14 (×4): 25 mg via ORAL
  Filled 2015-07-12 (×8): qty 1

## 2015-07-12 MED ORDER — SODIUM CHLORIDE 0.9 % IJ SOLN
3.0000 mL | Freq: Two times a day (BID) | INTRAMUSCULAR | Status: DC
  Administered 2015-07-12 – 2015-07-26 (×21): 3 mL via INTRAVENOUS

## 2015-07-12 NOTE — Telephone Encounter (Signed)
Pt husband called to inform us that his wife had a seizure and that he wants her transferred to [facility].  I told him that he would just need to let the MD know that he wanted her transferred due to her HF care is here.

## 2015-07-12 NOTE — Consult Note (Addendum)
Admission H&P    Chief Complaint: Generalized seizure, new onset.  HPI: Gail Chapman is an 59 y.o. female history of multiple sclerosis, diabetes mellitus, coronary hypertension, congestive heart failure, hypertension, COPD and end-stage renal disease on dialysis, transferred from [facility] for evaluation after brought to the ED there following a witnessed generalized seizure. Patient has no history of seizure disorder or previous seizure activity. 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 Patient was initially confused, but status has returned to baseline. She was hypertensive and required acute intervention, including Cardene drip. Patient is not ambulatory and lives at home with her spouse who is her primary caretaker. She requires help with ADLs. Her last MS flare was in 2010. She has experienced slowly progressive weakness of extremities, however, in the interim.  Past Medical History  Diagnosis Date  . Diabetes mellitus without complication   . Chronic diastolic CHF (congestive heart failure)     a. 01/2007 MV: No isch/infarct, nl EF;  b. 2012 Cath: "normal" per patient.  Performed in [place], [place] by Dr. Graciela Husbands.  . Pulmonary hypertension     a. on home O2 @ 3lpm 24hrs/day  . HTN (hypertension)     a. Dx in 1993.  Marland Kitchen LVH (left ventricular hypertrophy)   . OSA (obstructive sleep apnea)     a. uses CPAP  . Physical deconditioning   . Renal disorder   . COPD (chronic obstructive pulmonary disease)     Past Surgical History  Procedure Laterality Date  . Knee surgery    . Carpal tunnel release    . Tubal ligation    . Cholecystectomy    . Mass excision      a. under axilla.  . Subxyphoid pericardial window N/A 02/26/2013    Procedure: SUBXYPHOID PERICARDIAL WINDOW;  Surgeon: Delight Ovens, MD;  Location: [facility] OR;  Service: Thoracic;   Laterality: N/A;  . Left and right heart catheterization with coronary angiogram N/A 02/25/2013    Procedure: LEFT AND RIGHT HEART CATHETERIZATION WITH CORONARY ANGIOGRAM;  Surgeon: Dolores Patty, MD;  Location: [facility] CATH LAB;  Service: Cardiovascular;  Laterality: N/A;    Family History  Problem Relation Age of Onset  . Diabetes Mother   . CAD Mother   . Cancer Mother   . Hypertension Mother   . Cancer Father   . Diabetes Father   . Hyperlipidemia Father   . Hypertension Father   . Heart attack Father    Social History:  reports that she has quit smoking. She does not have any smokeless tobacco history on file. She reports that she does not drink alcohol or use illicit drugs.  Allergies:  Allergies  Allergen Reactions  . Clonidine Derivatives     Hand itching  . Hydralazine     Visual disturbances   . Labetalol     Fatigue   . Vancomycin Rash    "shut down my kidneys"    Medications Prior to Admission  Medication Sig Dispense Refill  . aspirin EC 325 MG EC tablet Take 1 tablet (325 mg total) by mouth daily. (Patient not taking: Reported on 05/05/2015) 30 tablet 3  . carvedilol (COREG) 12.5 MG tablet Take 1 tablet (12.5 mg total) by mouth 2 (two) times daily with a meal. On takes on non dialysis day. (Patient not taking: Reported on 05/05/2015) 60 tablet 3  .  doxazosin (CARDURA) 2 MG tablet Take 1 tablet (2 mg total) by mouth daily. 30 tablet 6  . glucose blood (FREESTYLE LITE) test strip Use as instructed (Patient not taking: Reported on 05/05/2015) 100 each 12  . insulin aspart (NOVOLOG) 100 UNIT/ML injection Inject 10 Units into the skin 3 (three) times daily with meals. Per sliding scale (Patient not taking: Reported on 05/05/2015) 10 mL 0  . insulin detemir (LEVEMIR) 100 UNIT/ML injection Inject 0.04 mLs (4 Units total) into the skin at bedtime. Hold if CBG<150 10 mL 0  . minoxidil (LONITEN) 10 MG tablet Take 0.5 tablets (5 mg total) by mouth 2 (two) times daily. 60  tablet 6  . pravastatin (PRAVACHOL) 40 MG tablet Take 1 tablet (40 mg total) by mouth daily. 30 tablet 6  . promethazine (PHENERGAN) 25 MG suppository Place 1 suppository (25 mg total) rectally at bedtime. (Patient not taking: Reported on 05/05/2015) 12 each 3  . sevelamer carbonate (RENVELA) 800 MG tablet Take 800 mg by mouth 3 (three) times daily with meals.      ROS: History obtained from the patient  General ROS: negative for - chills, fatigue, fever, night sweats, weight gain or weight loss Psychological ROS: negative for - behavioral disorder, hallucinations, memory difficulties, mood swings or suicidal ideation Ophthalmic ROS: negative for - blurry vision, double vision, eye pain or loss of vision ENT ROS: negative for - epistaxis, nasal discharge, oral lesions, sore throat, tinnitus or vertigo Allergy and Immunology ROS: negative for - hives or itchy/watery eyes Hematological and Lymphatic ROS: negative for - bleeding problems, bruising or swollen lymph nodes Endocrine ROS: negative for - galactorrhea, hair pattern changes, polydipsia/polyuria or temperature intolerance Respiratory ROS: negative for - cough, hemoptysis, shortness of breath or wheezing Cardiovascular ROS: negative for - chest pain, dyspnea on exertion, edema or irregular heartbeat Gastrointestinal ROS: negative for - abdominal pain, diarrhea, hematemesis, nausea/vomiting or stool incontinence Genito-Urinary ROS: negative for - dysuria, hematuria, incontinence or urinary frequency/urgency Musculoskeletal ROS: As noted in present illness Neurological ROS: as noted in HPI Dermatological ROS: negative for rash and skin lesion changes  Physical Examination: Blood pressure 165/77, pulse 73, temperature 98 F (36.7 C), temperature source Oral, height 5' 1.5" (1.562 m), weight 67.3 kg (148 lb 5.9 oz), SpO2 98 %.  HEENT-  Normocephalic, no lesions, without obvious abnormality.  Normal external eye and conjunctiva.  Normal  TM's bilaterally.  Normal auditory canals and external ears. Normal external nose, mucus membranes and septum.  Normal pharynx. Neck supple with no masses, nodes, nodules or enlargement. Cardiovascular - regular rate and rhythm, S1, S2 normal, no murmur, click, rub or gallop Lungs - chest clear, no wheezing, rales, normal symmetric air entry Abdomen - soft, non-tender; bowel sounds normal; no masses,  no organomegaly Extremities - no joint deformities, effusion, or inflammation and no edema  Neurologic Examination: Mental Status: Alert, oriented, thought content appropriate.  Speech fluent without evidence of aphasia. Able to follow commands without difficulty. Cranial Nerves: II-Visual fields were normal. III/IV/VI-Pupils were equal and reacted. Extraocular movements were full and conjugate.    V/VII-no facial numbness and no facial weakness. VIII-normal. X-normal speech and symmetrical palatal movement. XI: trapezius strength/neck flexion strength normal bilaterally XII-midline tongue extension with normal strength. Motor: Moderately severe weakness proximally and mild to moderate weakness distally of upper extremities, left greater than right; severe proximal weakness in moderate distal weakness of lower extremities; flaccid muscle tone throughout. Sensory: Absent vibratory sensation in lower extremities. Deep Tendon Reflexes:  2+, brisk and symmetrical. Plantars: Extensor bilaterally Cerebellar: Impaired finger-to-nose testing bilaterally, commensurate with weakness of upper extremities. Carotid auscultation: Normal  Results for orders placed or performed during the hospital encounter of 07/12/15 (from the past 48 hour(s))  MRSA PCR Screening     Status: None   Collection Time: 07/12/15  7:40 PM  Result Value Ref Range   MRSA by PCR NEGATIVE NEGATIVE    Comment:        The GeneXpert MRSA Assay (FDA approved for NASAL specimens only), is one component of a comprehensive MRSA  colonization surveillance program. It is not intended to diagnose MRSA infection nor to guide or monitor treatment for MRSA infections.   Glucose, capillary     Status: None   Collection Time: 07/12/15  8:11 PM  Result Value Ref Range   Glucose-Capillary 94 65 - 99 mg/dL   No results found.  Assessment/Plan 59 year old lady with multiple medical problems, including MS, diabetes mellitus, end-stage renal disease on dialysis, COPD and hypertension, presenting with new onset generalized seizure. Etiology is unclear. CT scan of her head was unremarkable. MRI of the brain is pending. Acute stroke or need to be ruled out. New-onset seizure activity may be a manifestation of progression of MS, however.  Recommendations: 1. MRI of the brain without and with contrast 2. Stroke workup with risk assessment if MRI is positive for acute ischemic lesion 3. EEG, routine adult study 4. Patient may need long-term anticonvulsive medication given her history of progressive MS as well as history of previous strokes.  We will continue to follow this patient with you  C.R. Roseanne Reno, MD [facility] [phone]  07/12/2015, 10:33 PM  Addendum: Patient underwent MRI study prior to transfer from [facility]. No acute stroke was demonstrated. Repeat MRI is not indicated, except to rule out possible new demyelinating brain lesion with a contrast study. No stroke workup is indicated.  CR Margaretha Glassing.D.

## 2015-07-12 NOTE — H&P (Signed)
[facility] History and Physical  Gail Chapman WUJ:[id number] DOB: Nov 08, 1955 DOA: 07/12/2015  Referring physician: ED physician PCP: No PCP Per Patient  Specialists:   Chief Complaint: Altered mental status, dizziness, seizure,    HPI: Gail Chapman is a 60 y.o. female with PMH of MS, chronic bilateral leg weakness (using wheelchair), hypertension, hyperlipidemia, diabetes mellitus, diastolic congestive heart failure, pulmonary hypertension, OSA on CPAP, COPD on 3 L oxygen at home, and end-stage renal disease-HD (MWF), who presents with altered mental status, dizziness, seizure.  Pt was transferred from [facility] for evaluation after brought to the ED there following a witnessed seizure.   Per patient's husband, patient became confused after using bathroom at 10:00 AM. She also had dizziness and aphasia. Sheand was noticed to have rgidity in both arms and deviated gaze to the right. The episode lasted for about 2 to 3 min. She was brought to [facility] ED where she was found to have elevated bp at 260/130 and elevated trop at 0.46. She was started with Cardizem drip in the emergency room. Pt did not have chest pain. She has mild cough, no sputum production, no fever or chills. She missed HD today. Patient has no history of seizure disorder or previous seizure activity. When I saw pt on the floor, her mental status has improved to baseline. Her blood pressure is also improved to 170/78.   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   # MRI of brain showed no acute intracranial findings, but showed extensive white matter disease which has progressed significantly compared with previous examination, with involvement of the pons and basal ganglia as well as the periventricular and subcortical white matter. Patient also had regions of old lacunar  infarct involving the cerebral hemisphere bilaterally. Patient has atrophy and ventriculomegaly which is progressed compared to the previous MRI.  In [facility] hospital ED, patient was found to have troponin 0.46, INR 1.2, PTT 60, negative urinalysis, WBC 9.18,  temperature normal, no tachycardia, creatinine 5.84, BUN 46. Chest x-ray negative for acute abnormalities.  Where does patient live?   At home   Can patient participate in ADLs?  Little   Review of Systems:   General: no fevers, chills, no changes in body weight, has poor appetite, has fatigue HEENT: no blurry vision, hearing changes or sore throat Pulm: no dyspnea, coughing, wheezing CV: no chest pain, palpitations Abd: has nausea, no vomiting, abdominal pain, diarrhea, constipation GU: no dysuria, burning on urination, increased urinary frequency, hematuria  Ext: no leg edema Neuro: has chronic leg weakness bilaterally, seizure, no vision change or hearing loss Skin: no rash MSK: No deformity, no limitation of range of movement in spin Heme: No easy bruising.  Travel history: No recent long distant travel.  Allergy:  Allergies  Allergen Reactions  . Clonidine Derivatives     Hand itching  . Hydralazine     Visual disturbances   . Labetalol     Fatigue   . Vancomycin Rash    "shut down my kidneys"    Past Medical History  Diagnosis Date  . Diabetes mellitus without complication   . Chronic diastolic CHF (congestive heart failure)     a. 01/2007 MV: No isch/infarct, nl EF;  b. 2012 Cath: "normal" per patient.  Performed in [place], [place] by Dr. Graciela Husbands.  . Pulmonary hypertension     a.  on home O2 @ 3lpm 24hrs/day  . HTN (hypertension)     a. Dx in 1993.  Marland Kitchen LVH (left ventricular hypertrophy)   . OSA (obstructive sleep apnea)     a. uses CPAP  . Physical deconditioning   . Renal disorder   . COPD (chronic obstructive pulmonary disease)     Past Surgical History  Procedure Laterality Date  . Knee surgery     . Carpal tunnel release    . Tubal ligation    . Cholecystectomy    . Mass excision      a. under axilla.  . Subxyphoid pericardial window N/A 02/26/2013    Procedure: SUBXYPHOID PERICARDIAL WINDOW;  Surgeon: Delight Ovens, MD;  Location: [facility] OR;  Service: Thoracic;  Laterality: N/A;  . Left and right heart catheterization with coronary angiogram N/A 02/25/2013    Procedure: LEFT AND RIGHT HEART CATHETERIZATION WITH CORONARY ANGIOGRAM;  Surgeon: Dolores Patty, MD;  Location: [facility] CATH LAB;  Service: Cardiovascular;  Laterality: N/A;    Social History:  reports that she has quit smoking. She does not have any smokeless tobacco history on file. She reports that she does not drink alcohol or use illicit drugs.  Family History:  Family History  Problem Relation Age of Onset  . Diabetes Mother   . CAD Mother   . Cancer Mother   . Hypertension Mother   . Cancer Father   . Diabetes Father   . Hyperlipidemia Father   . Hypertension Father   . Heart attack Father      Prior to Admission medications   Medication Sig Start Date End Date Taking? Authorizing Provider  aspirin EC 325 MG EC tablet Take 1 tablet (325 mg total) by mouth daily. Patient not taking: Reported on 05/05/2015 03/06/13   Amy D Clegg, NP  carvedilol (COREG) 12.5 MG tablet Take 1 tablet (12.5 mg total) by mouth 2 (two) times daily with a meal. On takes on non dialysis day. Patient not taking: Reported on 05/05/2015 11/24/13   Amy D Clegg, NP  doxazosin (CARDURA) 2 MG tablet Take 1 tablet (2 mg total) by mouth daily. 05/05/15   Dolores Patty, MD  glucose blood (FREESTYLE LITE) test strip Use as instructed Patient not taking: Reported on 05/05/2015 05/04/14   Dolores Patty, MD  insulin aspart (NOVOLOG) 100 UNIT/ML injection Inject 10 Units into the skin 3 (three) times daily with meals. Per sliding scale Patient not taking: Reported on 05/05/2015 04/23/14   Dolores Patty, MD  insulin detemir (LEVEMIR) 100  UNIT/ML injection Inject 0.04 mLs (4 Units total) into the skin at bedtime. Hold if CBG<150 04/23/14   Dolores Patty, MD  minoxidil (LONITEN) 10 MG tablet Take 0.5 tablets (5 mg total) by mouth 2 (two) times daily. 05/05/15   Dolores Patty, MD  pravastatin (PRAVACHOL) 40 MG tablet Take 1 tablet (40 mg total) by mouth daily. 10/13/14   Dolores Patty, MD  promethazine (PHENERGAN) 25 MG suppository Place 1 suppository (25 mg total) rectally at bedtime. Patient not taking: Reported on 05/05/2015 03/06/13   Amy D Clegg, NP  sevelamer carbonate (RENVELA) 800 MG tablet Take 800 mg by mouth 3 (three) times daily with meals.    Historical Provider, MD    Physical Exam: Filed Vitals:   07/13/15 0115 07/13/15 0125 07/13/15 0200 07/13/15 0300  BP: 188/88 187/84 183/76 183/85  Pulse: 88 64 61 87  Temp:      TempSrc:  Resp: Height:      Weight:      SpO2: 98% 100% 96% 100%   General: Not in acute distress HEENT:       Eyes: PERRL, EOMI, no scleral icterus.       ENT: No discharge from the ears and nose, no pharynx injection, no tonsillar enlargement.        Neck: No JVD, no bruit, no mass felt. Heme: No neck lymph node enlargement. Cardiac: S1/S2, RRR, No murmurs, No gallops or rubs. Pulm: No rales, wheezing, rhonchi or rubs. Has HD catheter over L chest with clean surrounding. Abd: Soft, nondistended, nontender, no rebound pain, no organomegaly, BS present. Ext: No pitting leg edema bilaterally. 2+DP/PT pulse bilaterally. Musculoskeletal: No joint deformities, No joint redness or warmth, no limitation of ROM in spin. Skin: No rashes.  Neuro: Alert, oriented to place and person, but not to time, cranial nerves II-XII grossly intact, muscle strength 4/5 in legs, 5/5 in arms, sensation to light touch intact. Brachial reflex 1+ bilaterally. Knee reflex 1+ bilaterally. Negative Babinski's sign. Normal finger to nose test. Psych: Patient is not psychotic, no suicidal or  hemocidal ideation.  Labs on Admission:  Basic Metabolic Panel: No results for input(s): NA, K, CL, CO2, GLUCOSE, BUN, CREATININE, CALCIUM, MG, PHOS in the last 168 hours. Liver Function Tests: No results for input(s): AST, ALT, ALKPHOS, BILITOT, PROT, ALBUMIN in the last 168 hours. No results for input(s): LIPASE, AMYLASE in the last 168 hours. No results for input(s): AMMONIA in the last 168 hours. CBC: No results for input(s): WBC, NEUTROABS, HGB, HCT, MCV, PLT in the last 168 hours. Cardiac Enzymes: No results for input(s): CKTOTAL, CKMB, CKMBINDEX, TROPONINI in the last 168 hours.  BNP (last 3 results) No results for input(s): BNP in the last 8760 hours.  ProBNP (last 3 results) No results for input(s): PROBNP in the last 8760 hours.  CBG:  Recent Labs Lab 07/12/15 2011  GLUCAP 94    Radiological Exams on Admission: No results found.  EKG: Independently reviewed.  Abnormal findings: Mild T-wave inversion laterally and it too, LAE.    Assessment/Plan Principal Problem:   Acute encephalopathy Active Problems:   OSA (obstructive sleep apnea)   Hypertensive emergency   Pulmonary hypertension (HCC)   Chronic diastolic CHF (congestive heart failure) (HCC)   COPD (chronic obstructive pulmonary disease) (HCC)   Seizures (HCC)   Elevated troponin   MS (multiple sclerosis) (HCC)   ESRD on dialysis (HCC)  Acute encephalopathy and possible seizure: Etiology is not clear, neurology was consulted. Dr. Roseanne Reno saw patient, recommended to r/o stroke and considering that the new-onset seizure activity may be a manifestation of progression of MS. Pt already had MRI in [facility], which did no show new stroke. I discussed with Dr. Roseanne Reno, who agreed that no need to repeat MRI now.  - will admit to SDU - Appreciated Dr. Marca Ancona consultation, will follow up recommendations. - EEG, routine adult study - per Dr. Roseanne Reno, patient may need long-term anticonvulsive  medication given her history of progressive MS as well as history of previous strokes. - Prn ativan for seizure  Hypertensive emergency and elevated trop: Bp was initially 260/130 and trop 0.46. No chest pain. Elevated troponin is most likely due to demanding ischemia secondary to hypertensive emergency. Patient was on hydralazine IV when necessary initially, will switched to metoprolol IV when necessary due to allergy to hydralazine. bp improved to 170/78 now. -Metoprolol IV prn  -  continue Coreg, doxazosin -increase lisinopril from 5 to 10 mg daily -trop x 3 -ekg in AM -2d echo   OSA: -CPAP  Chronic diastolic CHF (congestive heart failure): 2-D alcohol 05/05/15 showed EF of 55-60% with grade 3 diastolic dysfunction. Patient does not have leg edema. CHF is compensated on admission. -Check BNP  MS (multiple sclerosis): Has chronic bilateral leg edema. her last MS flare was in 2010. Currently not on medications. -follow up neurologist further recommendations.  ESRD-HD (MWF): missed HD on Monday. BUN 46, creatinine 5.84, bicarbonate 25, potassium is 4.2. -Left message to renal for HD today.   DVT ppx: SQ Heparin   Code Status: Full code Family Communication:  Yes, patient's husband at bed side Disposition Plan: Admit to inpatient   Date of Service 07/13/2015    Lorretta Harp [facility] Pager [phone]  If 7PM-7AM, please contact night-coverage [email or web address] Password [email or web address] 07/13/2015, 4:05 AM

## 2015-07-13 ENCOUNTER — Other Ambulatory Visit (HOSPITAL_COMMUNITY): Payer: BC Managed Care – PPO

## 2015-07-13 ENCOUNTER — Inpatient Hospital Stay (HOSPITAL_COMMUNITY): Payer: Medicare Other

## 2015-07-13 DIAGNOSIS — R079 Chest pain, unspecified: Secondary | ICD-10-CM

## 2015-07-13 DIAGNOSIS — G35 Multiple sclerosis: Secondary | ICD-10-CM | POA: Diagnosis present

## 2015-07-13 DIAGNOSIS — I319 Disease of pericardium, unspecified: Secondary | ICD-10-CM

## 2015-07-13 DIAGNOSIS — R7989 Other specified abnormal findings of blood chemistry: Secondary | ICD-10-CM | POA: Diagnosis present

## 2015-07-13 DIAGNOSIS — N186 End stage renal disease: Secondary | ICD-10-CM

## 2015-07-13 DIAGNOSIS — J449 Chronic obstructive pulmonary disease, unspecified: Secondary | ICD-10-CM | POA: Diagnosis present

## 2015-07-13 DIAGNOSIS — R778 Other specified abnormalities of plasma proteins: Secondary | ICD-10-CM | POA: Diagnosis present

## 2015-07-13 DIAGNOSIS — G9340 Encephalopathy, unspecified: Secondary | ICD-10-CM

## 2015-07-13 DIAGNOSIS — I5033 Acute on chronic diastolic (congestive) heart failure: Secondary | ICD-10-CM

## 2015-07-13 DIAGNOSIS — G4733 Obstructive sleep apnea (adult) (pediatric): Secondary | ICD-10-CM

## 2015-07-13 DIAGNOSIS — Z992 Dependence on renal dialysis: Secondary | ICD-10-CM

## 2015-07-13 DIAGNOSIS — I5032 Chronic diastolic (congestive) heart failure: Secondary | ICD-10-CM

## 2015-07-13 DIAGNOSIS — I272 Other secondary pulmonary hypertension: Secondary | ICD-10-CM

## 2015-07-13 DIAGNOSIS — R569 Unspecified convulsions: Secondary | ICD-10-CM

## 2015-07-13 DIAGNOSIS — I161 Hypertensive emergency: Secondary | ICD-10-CM

## 2015-07-13 LAB — CBC
HCT: 30.2 % — ABNORMAL LOW (ref 36.0–46.0)
Hemoglobin: 9.5 g/dL — ABNORMAL LOW (ref 12.0–15.0)
MCH: 28.4 pg (ref 26.0–34.0)
MCHC: 31.5 g/dL (ref 30.0–36.0)
MCV: 90.4 fL (ref 78.0–100.0)
Platelets: 156 K/uL (ref 150–400)
RBC: 3.34 MIL/uL — ABNORMAL LOW (ref 3.87–5.11)
RDW: 16.3 % — ABNORMAL HIGH (ref 11.5–15.5)
WBC: 10.7 K/uL — ABNORMAL HIGH (ref 4.0–10.5)

## 2015-07-13 LAB — COMPREHENSIVE METABOLIC PANEL
ALK PHOS: 47 U/L (ref 38–126)
ALT: 10 U/L — AB (ref 14–54)
AST: 13 U/L — ABNORMAL LOW (ref 15–41)
Albumin: 3.2 g/dL — ABNORMAL LOW (ref 3.5–5.0)
Anion gap: 10 (ref 5–15)
BILIRUBIN TOTAL: 0.6 mg/dL (ref 0.3–1.2)
BUN: 54 mg/dL — AB (ref 6–20)
CALCIUM: 7.9 mg/dL — AB (ref 8.9–10.3)
CO2: 26 mmol/L (ref 22–32)
CREATININE: 6.33 mg/dL — AB (ref 0.44–1.00)
Chloride: 105 mmol/L (ref 101–111)
GFR, EST AFRICAN AMERICAN: 8 mL/min — AB (ref >60)
GFR, EST NON AFRICAN AMERICAN: 7 mL/min — AB (ref >60)
Glucose, Bld: 118 mg/dL — ABNORMAL HIGH (ref 65–99)
Potassium: 3.7 mmol/L (ref 3.5–5.1)
Sodium: 141 mmol/L (ref 135–145)
Total Protein: 6.8 g/dL (ref 6.5–8.1)

## 2015-07-13 LAB — GLUCOSE, CAPILLARY
GLUCOSE-CAPILLARY: 126 mg/dL — AB (ref 65–99)
Glucose-Capillary: 112 mg/dL — ABNORMAL HIGH (ref 65–99)
Glucose-Capillary: 81 mg/dL (ref 65–99)
Glucose-Capillary: 121 mg/dL — ABNORMAL HIGH (ref 65–99)

## 2015-07-13 LAB — LIPID PANEL
CHOLESTEROL: 171 mg/dL (ref 0–200)
HDL: 67 mg/dL (ref >40)
LDL Cholesterol: 93 mg/dL (ref 0–99)
TRIGLYCERIDES: 54 mg/dL (ref <150)
Total CHOL/HDL Ratio: 2.6 RATIO
VLDL: 11 mg/dL (ref 0–40)

## 2015-07-13 LAB — TROPONIN I
TROPONIN I: 0.07 ng/mL — AB (ref <0.031)
TROPONIN I: 0.06 ng/mL — AB (ref <0.031)
TROPONIN I: 0.05 ng/mL — AB (ref <0.031)

## 2015-07-13 LAB — BRAIN NATRIURETIC PEPTIDE

## 2015-07-13 MED ORDER — MINOXIDIL 2.5 MG PO TABS
5.0000 mg | ORAL_TABLET | Freq: Two times a day (BID) | ORAL | Status: DC
Start: 2015-07-13 — End: 2015-07-15
  Administered 2015-07-14: 5 mg via ORAL
  Filled 2015-07-13 (×6): qty 2

## 2015-07-13 MED ORDER — LISINOPRIL 10 MG PO TABS
10.0000 mg | ORAL_TABLET | Freq: Every day | ORAL | Status: DC
  Filled 2015-07-13 (×2): qty 1

## 2015-07-13 MED ORDER — DIVALPROEX SODIUM 500 MG PO DR TAB
500.0000 mg | DELAYED_RELEASE_TABLET | Freq: Two times a day (BID) | ORAL | Status: DC
  Administered 2015-07-13 – 2015-07-14 (×2): 500 mg via ORAL
  Filled 2015-07-13 (×5): qty 1

## 2015-07-13 MED ORDER — REGADENOSON 0.4 MG/5ML IV SOLN
0.4000 mg | Freq: Once | INTRAVENOUS | Status: DC
  Filled 2015-07-13: qty 5

## 2015-07-13 MED ORDER — CETYLPYRIDINIUM CHLORIDE 0.05 % MT LIQD
7.0000 mL | Freq: Two times a day (BID) | OROMUCOSAL | Status: DC
  Administered 2015-07-13 – 2015-07-26 (×23): 7 mL via OROMUCOSAL

## 2015-07-13 MED ORDER — METOPROLOL TARTRATE 1 MG/ML IV SOLN
2.5000 mg | INTRAVENOUS | Status: DC | PRN
  Administered 2015-07-13 – 2015-07-14 (×4): 2.5 mg via INTRAVENOUS
  Filled 2015-07-13 (×5): qty 5

## 2015-07-13 MED ORDER — CARVEDILOL 12.5 MG PO TABS: 12.5000 mg | ORAL_TABLET | Freq: Two times a day (BID) | ORAL | Status: DC

## 2015-07-13 MED ORDER — DARBEPOETIN ALFA 60 MCG/0.3ML IJ SOSY
60.0000 ug | PREFILLED_SYRINGE | Freq: Once | INTRAMUSCULAR | Status: DC
  Filled 2015-07-13: qty 0.3

## 2015-07-13 MED ORDER — SEVELAMER CARBONATE 800 MG PO TABS
800.0000 mg | ORAL_TABLET | Freq: Three times a day (TID) | ORAL | Status: DC
  Filled 2015-07-13 (×18): qty 1

## 2015-07-13 MED ORDER — LISINOPRIL 10 MG PO TABS
10.0000 mg | ORAL_TABLET | Freq: Every day | ORAL | Status: DC
Start: 2015-07-13 — End: 2015-07-13
  Filled 2015-07-13: qty 1

## 2015-07-13 MED ORDER — CALCITRIOL 0.25 MCG PO CAPS
0.2500 ug | ORAL_CAPSULE | ORAL | Status: DC
  Administered 2015-07-16 – 2015-07-23 (×3): 0.25 ug via ORAL
  Filled 2015-07-13 (×3): qty 1

## 2015-07-13 MED ORDER — INSULIN ASPART 100 UNIT/ML ~~LOC~~ SOLN: 0.0000 [IU] | Freq: Three times a day (TID) | SUBCUTANEOUS | Status: DC

## 2015-07-13 MED ORDER — PRAVASTATIN SODIUM 40 MG PO TABS
40.0000 mg | ORAL_TABLET | Freq: Every day | ORAL | Status: DC
  Administered 2015-07-14 – 2015-07-21 (×6): 40 mg via ORAL
  Filled 2015-07-13 (×9): qty 1

## 2015-07-13 MED ORDER — LISINOPRIL 20 MG PO TABS: 20.0000 mg | ORAL_TABLET | Freq: Every day | ORAL | Status: DC

## 2015-07-13 MED ORDER — ASPIRIN EC 325 MG PO TBEC
325.0000 mg | DELAYED_RELEASE_TABLET | Freq: Every day | ORAL | Status: DC
  Administered 2015-07-13 – 2015-07-25 (×10): 325 mg via ORAL
  Filled 2015-07-13 (×11): qty 1

## 2015-07-13 MED ORDER — DOXAZOSIN MESYLATE 2 MG PO TABS: 2.0000 mg | ORAL_TABLET | Freq: Every day | ORAL | Status: DC

## 2015-07-13 MED ORDER — METOPROLOL TARTRATE 1 MG/ML IV SOLN
2.5000 mg | Freq: Once | INTRAVENOUS | Status: AC
  Administered 2015-07-13: 2.5 mg via INTRAVENOUS
  Filled 2015-07-13: qty 5

## 2015-07-13 MED ORDER — DIVALPROEX SODIUM 500 MG PO DR TAB: 500.0000 mg | DELAYED_RELEASE_TABLET | Freq: Two times a day (BID) | ORAL | Status: DC

## 2015-07-13 NOTE — Evaluation (Signed)
Clinical/Bedside Swallow Evaluation Patient Details  Name: Gail Chapman MRN: [id number] Date of Birth: 1956-09-16  Today's Date: 07/13/2015 Time: SLP Start Time (ACUTE ONLY): 0945 SLP Stop Time (ACUTE ONLY): 1000 SLP Time Calculation (min) (ACUTE ONLY): 15 min  Past Medical History:  Past Medical History  Diagnosis Date  . Diabetes mellitus without complication   . Chronic diastolic CHF (congestive heart failure)     a. 01/2007 MV: No isch/infarct, nl EF;  b. 2012 Cath: "normal" per patient.  Performed in [place], [place] by Dr. Graciela Husbands.  . Pulmonary hypertension     a. on home O2 @ 3lpm 24hrs/day  . HTN (hypertension)     a. Dx in 1993.  Marland Kitchen LVH (left ventricular hypertrophy)   . OSA (obstructive sleep apnea)     a. uses CPAP  . Physical deconditioning   . Renal disorder   . COPD (chronic obstructive pulmonary disease)    Past Surgical History:  Past Surgical History  Procedure Laterality Date  . Knee surgery    . Carpal tunnel release    . Tubal ligation    . Cholecystectomy    . Mass excision      a. under axilla.  . Subxyphoid pericardial window N/A 02/26/2013    Procedure: SUBXYPHOID PERICARDIAL WINDOW;  Surgeon: Delight Ovens, MD;  Location: [facility] OR;  Service: Thoracic;  Laterality: N/A;  . Left and right heart catheterization with coronary angiogram N/A 02/25/2013    Procedure: LEFT AND RIGHT HEART CATHETERIZATION WITH CORONARY ANGIOGRAM;  Surgeon: Dolores Patty, MD;  Location: [facility] CATH LAB;  Service: Cardiovascular;  Laterality: N/A;   HPI:  Gail Chapman is a 59 y.o. female with PMH of MS, chronic bilateral leg weakness (using wheelchair), HTN, hyperlipidemia, DM, diastolic congestive heart failure, pulmonary HTN, OSA on CPAP, COPD on 3 L oxygen at home, and end-stage renal disease-HD (MWF), who presented to [facility] after whitnessed seizure at [facility], AMS, and dizziness. Pt's husband reported pt became dizzy and had aphasia, bilateral arm rigidity, and deviated  gaze to the right. MRI head showed no acute intracranial findings, but extensive white matter disease which has progressed significantly compared with previous examination, with involvement of the pons and basal ganglia as well as the periventricular and subcortical white matter. Pt also had regions of old lacunar infarct involving the cerebral hemisphere bilaterally. Patient has atrophy and ventriculomegaly which is progressed compared to the previous MRI. MD r/o stroke, considering new-onset seizure activity to cause progression of MS. No recent CXR, currently on a reg diet with thin liquids. Not previously seen by SLP.   Assessment / Plan / Recommendation Clinical Impression  Pt tolerated all consistencies assessed with no overt s/sx of aspiration or dysphagia. SLP educated pt and pt's husband on typical aspiration precautions and recommendations for limiting fatigue with meals. SLP recommends regular diet, thin liquids, would benefit from several small meals/ or snacks between meals. SLP will sign off, no f/u needed at this time.     Aspiration Risk  Mild    Diet Recommendation Age appropriate regular solids;Thin   Medication Administration: Whole meds with liquid Compensations: Small sips/bites;Feed no longer than 30 minutes;Slow rate    Other  Recommendations     Follow Up Recommendations       Frequency and Duration        Pertinent Vitals/Pain NA    SLP Swallow Goals     Swallow Study Prior Functional Status  General Date of Onset: 07/13/15 Other Pertinent Information: Gail Chapman is a 59 y.o. female with PMH of MS, chronic bilateral leg weakness (using wheelchair), HTN, hyperlipidemia, DM, diastolic congestive heart failure, pulmonary HTN, OSA on CPAP, COPD on 3 L oxygen at home, and end-stage renal disease-HD (MWF), who presented to [facility] after whitnessed seizure at [facility], AMS, and dizziness. Pt's husband reported pt became dizzy and had aphasia, bilateral  arm rigidity, and deviated gaze to the right. MRI head showed no acute intracranial findings, but extensive white matter disease which has progressed significantly compared with previous examination, with involvement of the pons and basal ganglia as well as the periventricular and subcortical white matter. Pt also had regions of old lacunar infarct involving the cerebral hemisphere bilaterally. Patient has atrophy and ventriculomegaly which is progressed compared to the previous MRI. MD r/o stroke, considering new-onset seizure activity to cause progression of MS. No recent CXR, currently on a reg diet with thin liquids. Not previously seen by SLP. Type of Study: Bedside swallow evaluation Diet Prior to this Study: Regular;Thin liquids Temperature Spikes Noted: No History of Recent Intubation: No Behavior/Cognition: Alert;Cooperative;Pleasant mood Oral Cavity - Dentition: Missing dentition Self-Feeding Abilities: Able to feed self Patient Positioning: Upright in bed Baseline Vocal Quality: Normal Volitional Cough: Weak Volitional Swallow: Able to elicit    Oral/Motor/Sensory Function Overall Oral Motor/Sensory Function: Appears within functional limits for tasks assessed   Ice Chips Ice chips: Not tested   Thin Liquid Thin Liquid: Within functional limits Presentation: Straw;Cup    Nectar Thick Nectar Thick Liquid: Not tested   Honey Thick Honey Thick Liquid: Not tested   Puree Puree: Within functional limits Presentation: Self Fed   Solid   GO    Solid: Within functional limits Presentation: Self Fed      Gail Chapman, Student-SLP  Gail Chapman 07/13/2015,10:26 AM

## 2015-07-13 NOTE — Procedures (Signed)
ELECTROENCEPHALOGRAM REPORT   Patient: Gail Chapman       Room #: 3S10 EEG No. ID: [id number] Age: 59 y.o.        Sex: female Referring Physician: Rai Report Date:  07/13/2015        Interpreting Physician: Thana Farr  History: [name] is an 59 y.o. female presenting after witnessed seizure at home  Medications:  Scheduled: . antiseptic oral rinse  7 mL Mouth Rinse BID  . aspirin EC  325 mg Oral Daily  . [START ON 07/14/2015] calcitRIOL  0.25 mcg Oral Q M,W,F-HD  . carvedilol  25 mg Oral BID WC  . darbepoetin (ARANESP) injection - DIALYSIS  60 mcg Intravenous Once  . doxazosin  2 mg Oral Daily  . heparin  5,000 Units Subcutaneous 3 times per day  . insulin aspart  0-9 Units Subcutaneous TID WC  . insulin glargine  3 Units Subcutaneous Daily  . lisinopril  10 mg Oral Daily  . minoxidil  5 mg Oral BID  . pravastatin  40 mg Oral q1800  . sevelamer carbonate  800 mg Oral TID WC  . sodium chloride  3 mL Intravenous Q12H    Conditions of Recording:  This is a 16 channel EEG carried out with the patient in the awake, drowsy and asleep states.  Description:  The waking background activity consists of a low voltage, symmetrical, fairly well organized, 8 Hz alpha activity, seen from the parieto-occipital and posterior temporal regions.  Low voltage fast activity, poorly organized, is seen anteriorly and is at times superimposed on more posterior regions.  A mixture of theta and alpha rhythms are seen from the central and temporal regions.  Normal wakefulness is not sustained and is often seen to have superimposed intermittent polymorphic delta activity that is at times prolonged. The patient does eventually drowse with slowing to irregular, low voltage theta and beta activity and no evidence of normal waking activity.   The patient goes in to a light sleep with symmetrical sleep spindles, vertex central sharp transients and irregular slow activity.  Intermittently during the  recording is also noted sharp activity from the left hemisphere with phase reversal at [facility].   Hyperventilation is not performed.  Intermittent photic stimulation was performed but failed to illicit any change in the tracing.    IMPRESSION: This is an abnormal EEG secondary to focal sharp wave activity in the frontal region on the left with phase reversal at [facility].  This finding is suggestive of a focal disturbance with epileptogenic potential.    Thana Farr, MD [facility] [phone] 07/13/2015, 4:21 PM

## 2015-07-13 NOTE — Progress Notes (Signed)
[facility]                                                                              Patient Demographics  Gail Chapman, is a 59 y.o. female, DOB - 1955/10/18, KGY:[id number]  Admit date - 07/12/2015   Admitting Physician Lorretta Harp, MD  Outpatient Primary MD for the patient is No PCP Per Patient  LOS - 1  Chief complaint: Dizziness with seizures  Brief HPI  Gail Chapman is a 59 y.o. female with PMH of MS, chronic bilateral leg weakness (using wheelchair), hypertension, hyperlipidemia, diabetes mellitus, diastolic congestive heart failure, pulmonary hypertension, OSA on CPAP, COPD on 3 L oxygen at home, and end-stage renal disease-HD (MWF), who presented with altered mental status, dizziness, seizure. Pt was transferred from [facility] for evaluation after brought to the ED there following a witnessed seizure.  Per patient's husband, patient became confused after using bathroom at 10:00 AM on the day of admission. She also had dizziness and aphasia, subsequently noticed to have rigidity in both arms and deviated gaze to the right. The episode lasted for about 2 to 3 min. She was brought to [facility] ED where she was found to have elevated bp at 260/130 and elevated trop at 0.46. She was started with Cardizem drip in the emergency room. Pt did not have chest pain. She had mild cough, no sputum production, no fever or chills. She missed HD which was her regular day on Monday 10/3. Patient had no prior history of seizure disorder or previous seizure activity.  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  # MRI of brain showed no acute intracranial findings, but showed extensive white matter disease which has progressed significantly compared with previous examination, with involvement of the pons and basal ganglia as  well as the periventricular and subcortical white matter. Patient also had regions of old lacunar infarct involving the cerebral hemisphere bilaterally. Patient has atrophy and ventriculomegaly which is progressed compared to the previous MRI. In [facility] hospital ED, patient was found to have troponin 0.46, INR 1.2, PTT 60, negative urinalysis, WBC 9.18, temperature normal, no tachycardia, creatinine 5.84, BUN 46. Chest x-ray negative for acute abnormalities.    Assessment & Plan    Principal Problem:   Acute encephalopathy, seizures in the setting of underlying MS, ESRD, hypertensive encephalopathy. No prior history of seizures - MRI of the brain at [facility] did not show acute stroke, no further stroke workup per neurology - EEG pending - Patient may need long-term anticonvulsive seizure medications, await neurology recommendations  Active Problems: Hypertensive emergency and elevated trop: Bp was initially 260/130 and trop 0.46. -  No chest pain. Troponin 0.07 - BP still elevated although improving - Increased lisinopril, Coreg, doxazosin, continue aspirin -Follow 2-D echocardiogram    OSA: -CPAP qhs  Chronic diastolic CHF (congestive heart failure): 2-D echo 05/05/15 showed EF of 55-60% with grade 3 diastolic dysfunction. Patient does not have leg edema. - Volume management per hemodialysis  MS (multiple  sclerosis): Has chronic bilateral leg edema. her last MS flare was in 2010. Currently not on medications. -follow up neurologist further recommendations.  ESRD-HD (MWF): missed HD on Monday.  - Nephrology consult called, may need HD today    Code Status: Full code   Family Communication: Discussed in detail with the patient, all imaging results, lab results explained to the patient and husband at the bedside  Disposition Plan: monitor in SDU today  Time Spent in minutes   25 minutes  Procedures  None   Consults   Neurology Nephrology  DVT Prophylaxis   heparin subcutaneous  Medications  Scheduled Meds: . antiseptic oral rinse  7 mL Mouth Rinse BID  . aspirin EC  325 mg Oral Daily  . carvedilol  25 mg Oral BID WC  . doxazosin  2 mg Oral Daily  . heparin  5,000 Units Subcutaneous 3 times per day  . insulin aspart  0-9 Units Subcutaneous TID WC  . insulin glargine  3 Units Subcutaneous Daily  . lisinopril  10 mg Oral Daily  . minoxidil  5 mg Oral BID  . pravastatin  40 mg Oral q1800  . sevelamer carbonate  800 mg Oral TID WC  . sodium chloride  3 mL Intravenous Q12H   Continuous Infusions:  PRN Meds:.acetaminophen **OR** acetaminophen, LORazepam, metoprolol, ondansetron **OR** ondansetron (ZOFRAN) IV   Antibiotics   Anti-infectives    None        Subjective:   Gail Chapman was seen and examined today. Feeling improved today, no overnight seizures. No fevers or chills.  Patient denies dizziness, chest pain, shortness of breath, abdominal pain, N/V/D/C, new weakness, numbess, tingling. No acute events overnight.    Objective:   Blood pressure 174/87, pulse 90, temperature 98 F (36.7 C), temperature source Oral, resp. rate 15, height 5' 1.5" (1.562 m), weight 67.7 kg (149 lb 4 oz), SpO2 99 %.  Wt Readings from Last 3 Encounters:  07/13/15 67.7 kg (149 lb 4 oz)  05/05/15 74.844 kg (165 lb)  08/28/14 76.658 kg (169 lb)     Intake/Output Summary (Last 24 hours) at 07/13/15 0943 Last data filed at 07/13/15 0800  Gross per 24 hour  Intake 275.83 ml  Output      0 ml  Net 275.83 ml    Exam  General: Alert and oriented x 3, NAD  HEENT:  PERRLA, EOMI, Anicteric Sclera, mucous membranes moist.   Neck: Supple, no JVD, no masses  CVS: S1 S2 auscultated, no rubs, murmurs or gallops. Regular rate and rhythm.  Respiratory: Clear to auscultation bilaterally, no wheezing, rales or rhonchi  Abdomen: Soft, nontender, nondistended, + bowel sounds  Ext: no cyanosis clubbing or edema  Neuro: AAOx3, Cr N's II- XII.  Strength 4/5 upper and lower extremities bilaterally  Skin: No rashes  Psych: Normal affect and demeanor, alert and oriented x3    Data Review   Micro Results Recent Results (from the past 240 hour(s))  MRSA PCR Screening     Status: None   Collection Time: 07/12/15  7:40 PM  Result Value Ref Range Status   MRSA by PCR NEGATIVE NEGATIVE Final    Comment:        The GeneXpert MRSA Assay (FDA approved for NASAL specimens only), is one component of a comprehensive MRSA colonization surveillance program. It is not intended to diagnose MRSA infection nor to guide or monitor treatment for MRSA infections.     Radiology Reports No results found.  CBC  Recent Labs Lab 07/13/15 0344  WBC 10.7*  HGB 9.5*  HCT 30.2*  PLT 156  MCV 90.4  MCH 28.4  MCHC 31.5  RDW 16.3*    Chemistries   Recent Labs Lab 07/13/15 0344  NA 141  K 3.7  CL 105  CO2 26  GLUCOSE 118*  BUN 54*  CREATININE 6.33*  CALCIUM 7.9*  AST 13*  ALT 10*  ALKPHOS 47  BILITOT 0.6   ------------------------------------------------------------------------------------------------------------------ estimated creatinine clearance is 8.7 mL/min (by C-G formula based on Cr of 6.33). ------------------------------------------------------------------------------------------------------------------ No results for input(s): HGBA1C in the last 72 hours. ------------------------------------------------------------------------------------------------------------------  Recent Labs  07/13/15 0344  CHOL 171  HDL 67  LDLCALC 93  TRIG 54  CHOLHDL 2.6   ------------------------------------------------------------------------------------------------------------------ No results for input(s): TSH, T4TOTAL, T3FREE, THYROIDAB in the last 72 hours.  Invalid input(s): FREET3 ------------------------------------------------------------------------------------------------------------------ No results for  input(s): VITAMINB12, FOLATE, FERRITIN, TIBC, IRON, RETICCTPCT in the last 72 hours.  Coagulation profile No results for input(s): INR, PROTIME in the last 168 hours.  No results for input(s): DDIMER in the last 72 hours.  Cardiac Enzymes  Recent Labs Lab 07/13/15 0344  TROPONINI 0.07*   ------------------------------------------------------------------------------------------------------------------ Invalid input(s): POCBNP   Recent Labs  07/12/15 2011 07/13/15 0808  GLUCAP 94 112*     [name] Schake M.D. [facility] 07/13/2015, 9:43 AM  Pager: [phone] Between 7am to 7pm - call Pager - [phone]  After 7pm go to [email or web address] - password [email or web address]  Call night coverage person covering after 7pm

## 2015-07-13 NOTE — Progress Notes (Signed)
PT Cancellation Note  Patient Details Name: Gail Chapman MRN: [id number] DOB: 22-Aug-1956   Cancelled Treatment:    Reason Eval/Treat Not Completed: Patient at procedure or test/unavailable.(Pt at EEG and then will go to HD per RN.Will see 10/5 as able) 07/13/2015  [place] Bing, PT [phone]  (pager)    Cynda Soule, Eliseo Gum 07/13/2015, 2:55 PM

## 2015-07-13 NOTE — Care Management Note (Addendum)
Case Management Note  Patient Details  Name: Gail Chapman MRN: [id number] Date of Birth: 10-03-1956  Subjective/Objective:       Admitted with AMS, dizziness, seizure hx of MS, chronic bilateral leg weakness (using wheelchair), hypertension, hyperlipidemia, diabetes mellitus, diastolic congestive heart failure, pulmonary hypertension, OSA on CPAP, COPD on 3 L oxygen at home, and end-stage renal disease-HD (MWF).     Action/Plan: Return to home when medically stable. CM to f/u  Disposition needs.  Expected Discharge Date:                  Expected Discharge Plan:  Home/Self Care  In-House Referral:     Discharge planning Services  CM Consult  Post Acute Care Choice:    Choice offered to:     DME Arranged:  Oxygen (active with [employer] per pt for oxygen) DME Agency:  Patsy Lager  HH Arranged:    HH Agency:     Status of Service:  In process, will continue to follow  Medicare Important Message Given:    Date Medicare IM Given:    Medicare IM give by:    Date Additional Medicare IM Given:    Additional Medicare Important Message give by:     If discussed at Long Length of Stay Meetings, dates discussed:    Additional Comments:  Pt has personal rolling walker. Chrissie Noa Dolin (Spouse)  [phone]  Gae Gallop [place], [place] [phone] 07/13/2015, 4:58 PM

## 2015-07-13 NOTE — Progress Notes (Signed)
CARDIOLOGY CONSULT NOTE   Patient ID: Gail Chapman MRN: [id number] DOB/AGE: 1956/03/23 59 y.o.  Admit date: 07/12/2015  Primary Physician   No PCP Per Patient Primary Cardiologist  Dr. Gala Romney  Reason for Consultation   Elevated troponin  HPI: Gail Chapman is a 59 y.o. female with a history of severe HTN, DM, chronic diastolic CHF, pulmonary HTN, pericardial effusion and ESRD on HD who was admitted overnight from [facility] for further evaluation of a seizure yesterday. Her troponin was noted to be elevated ( 0.07-->0.06) and cardiology was consulted.  Last HD Friday without any complication. She reports feeling well yesterday morning but doesn't remember events surrounding seizure. Husband reports she was resting on the couch yesterday when he witnessed her have a seizure, episode lasted about 2-3 minutes. She was brought to the ED in [place], she was hypertensive and confused. No acute findings on CT. She was transferred to [facility] for further workup. Neurology following. CT and MRI no acute findings. She had an abnormal EEG secondary to focal sharp wave activity in the frontal region on the left with phase reversal at [facility]. This finding is suggestive of a focal disturbance with epileptogenic potential  Troponin elevated but flat and trending downwards. BNP >4500. 2D ECHO 07/13/2015 w/ LVEF:50- 55% severe concentric hypertrophy,is akinesis of the basal-midinferior myocardium. Doppler parameters are consistent with a reversible restrictive pattern  G3DD, mod MR,moderately dilated LA, mildly dilated RA, PApeak pressure: 43 mm Hg (S) and small to moderate pericardialeffusion was identified posterior to the heart (1.5cm) with no tamponade physiology.  Per notes, she had a normal myoview in 2008 and reportedly normal cardiac cath (per patient) in 2012 in [place]. She was last seen by Dr. Garth Schlatter in the CHF clinic in 04/2014. She continued to have a moderate pericardial  effusion without HD compromise. He suspected this was due to lack of adequate HD as well as minoxidil and PHTN. He cut minoxidil down to bid (hard to stop completely as it is the only thing that controlled her BP) and added back cardura  daily and wean minoxidil as tolerated. He planned to see her back in 2 months for repeat 2D ECHO.   The patient has had no chest pain or SOB. She is feeling well and waiting to go to HD.    Past Medical History  Diagnosis Date  . Diabetes mellitus without complication   . Chronic diastolic CHF (congestive heart failure)     a. 01/2007 MV: No isch/infarct, nl EF;  b. 2012 Cath: "normal" per patient.  Performed in [place], [place] by Dr. Graciela Husbands.  . Pulmonary hypertension     a. on home O2 @ 3lpm 24hrs/day  . HTN (hypertension)     a. Dx in 1993.  Marland Kitchen LVH (left ventricular hypertrophy)   . OSA (obstructive sleep apnea)     a. uses CPAP  . Physical deconditioning   . Renal disorder   . COPD (chronic obstructive pulmonary disease)      Past Surgical History  Procedure Laterality Date  . Knee surgery    . Carpal tunnel release    . Tubal ligation    . Cholecystectomy    . Mass excision      a. under axilla.  . Subxyphoid pericardial window N/A 02/26/2013    Procedure: SUBXYPHOID PERICARDIAL WINDOW;  Surgeon: Delight Ovens, MD;  Location: [facility] OR;  Service: Thoracic;  Laterality: N/A;  . Left and right heart catheterization  with coronary angiogram N/A 02/25/2013    Procedure: LEFT AND RIGHT HEART CATHETERIZATION WITH CORONARY ANGIOGRAM;  Surgeon: Dolores Patty, MD;  Location: [facility] CATH LAB;  Service: Cardiovascular;  Laterality: N/A;    Allergies  Allergen Reactions  . Clonidine Derivatives     Hand itching  . Hydralazine     Visual disturbances   . Labetalol     Fatigue   . Vancomycin Rash    "shut down my kidneys"    I have reviewed the patient's current medications . antiseptic oral rinse  7 mL Mouth Rinse BID  . aspirin EC  325 mg  Oral Daily  . [START ON 07/14/2015] calcitRIOL  0.25 mcg Oral Q M,W,F-HD  . carvedilol  25 mg Oral BID WC  . darbepoetin (ARANESP) injection - DIALYSIS  60 mcg Intravenous Once  . divalproex  500 mg Oral Q12H  . doxazosin  2 mg Oral Daily  . heparin  5,000 Units Subcutaneous 3 times per day  . insulin aspart  0-9 Units Subcutaneous TID WC  . insulin glargine  3 Units Subcutaneous Daily  . lisinopril  10 mg Oral Daily  . minoxidil  5 mg Oral BID  . pravastatin  40 mg Oral q1800  . sevelamer carbonate  800 mg Oral TID WC  . sodium chloride  3 mL Intravenous Q12H     acetaminophen **OR** acetaminophen, LORazepam, metoprolol, ondansetron **OR** ondansetron (ZOFRAN) IV  Prior to Admission medications   Medication Sig Start Date End Date Taking? Authorizing Provider  aspirin EC 325 MG EC tablet Take 1 tablet (325 mg total) by mouth daily. 03/06/13  Yes Amy D Clegg, NP  carvedilol (COREG) 12.5 MG tablet Take 1 tablet (12.5 mg total) by mouth 2 (two) times daily with a meal. On takes on non dialysis day. 11/24/13  Yes Amy D Clegg, NP  doxazosin (CARDURA) 2 MG tablet Take 1 tablet (2 mg total) by mouth daily. 05/05/15  Yes Dolores Patty, MD  insulin aspart (NOVOLOG) 100 UNIT/ML injection Inject 10 Units into the skin 3 (three) times daily with meals. Per sliding scale 04/23/14  Yes Dolores Patty, MD  insulin detemir (LEVEMIR) 100 UNIT/ML injection Inject 0.04 mLs (4 Units total) into the skin at bedtime. Hold if CBG<150 04/23/14  Yes Dolores Patty, MD  glucose blood (FREESTYLE LITE) test strip Use as instructed Patient not taking: Reported on 05/05/2015 05/04/14   Dolores Patty, MD  promethazine (PHENERGAN) 25 MG suppository Place 1 suppository (25 mg total) rectally at bedtime. Patient not taking: Reported on 05/05/2015 03/06/13   Sherald Hess, NP     Social History   Social History  . Marital Status: Married    Spouse Name: N/A  . Number of Children: N/A  . Years of Education:  N/A   Occupational History  . Not on file.   Social History Main Topics  . Smoking status: Former Games developer  . Smokeless tobacco: Not on file  . Alcohol Use: No  . Drug Use: No     Comment: "smoked weed"  . Sexual Activity: Not on file   Other Topics Concern  . Not on file   Social History Narrative   Lives in [place] with her husband.  They have 2 grown children.  She is on disability.    No family status information on file.   Family History  Problem Relation Age of Onset  . Diabetes Mother   . CAD Mother   .  Cancer Mother   . Hypertension Mother   . Cancer Father   . Diabetes Father   . Hyperlipidemia Father   . Hypertension Father   . Heart attack Father      ROS:  Full 14 point review of systems complete and found to be negative unless listed above.  Physical Exam: Blood pressure 161/128, pulse 66, temperature 98.3 F (36.8 C), temperature source Oral, resp. rate 10, height 5' 1.5" (1.562 m), weight 67.7 kg (149 lb 4 oz), SpO2 100 %.  General: Well developed, well nourished, female in no acute distress Head: Eyes PERRLA, No xanthomas.   Normocephalic and atraumatic, oropharynx without edema or exudate.  Lungs: CTAB Heart: HRRR S1 S2, no rub/gallop, Heart reg rate and rhythm with S1, S2  murmur. pulses are 2+ extrem.   Neck: No carotid bruits. No lymphadenopathy. Elevated JVD. Abdomen: Bowel sounds present, abdomen soft and non-tender without masses or hernias noted. Msk:  No spine or cva tenderness. No weakness, no joint deformities or effusions. Extremities: No clubbing or cyanosis. No edema.  Neuro: Alert and oriented X 3. No focal deficits noted. Psych:  Good affect, responds appropriately Skin: No rashes or lesions noted.  Labs:  Lab Results  Component Value Date   WBC 10.7* 07/13/2015   HGB 9.5* 07/13/2015   HCT 30.2* 07/13/2015   MCV 90.4 07/13/2015   PLT 156 07/13/2015   No results for input(s): INR in the last 72 hours.  Recent Labs Lab  07/13/15 0344  NA 141  K 3.7  CL 105  CO2 26  BUN 54*  CREATININE 6.33*  CALCIUM 7.9*  PROT 6.8  BILITOT 0.6  ALKPHOS 47  ALT 10*  AST 13*  GLUCOSE 118*  ALBUMIN 3.2*   MAGNESIUM  Date Value Ref Range Status  03/01/2013 2.0 1.5 - 2.5 mg/dL Final    Recent Labs  40/98/11 0344 07/13/15 1128  TROPONINI 0.07* 0.06*   No results for input(s): TROPIPOC in the last 72 hours. PRO B NATRIURETIC PEPTIDE (BNP)  Date/Time Value Ref Range Status  04/10/2013 06:55 PM 19621.0* 0 - 125 pg/mL Final  02/20/2013 05:00 AM 57495.0* 0 - 125 pg/mL Final   Lab Results  Component Value Date   CHOL 171 07/13/2015   HDL 67 07/13/2015   LDLCALC 93 07/13/2015   TRIG 54 07/13/2015   No results found for: DDIMER No results found for: LIPASE, AMYLASE TSH  Date/Time Value Ref Range Status  02/19/2013 10:00 PM 1.681 0.350 - 4.500 uIU/mL Final   FERRITIN  Date/Time Value Ref Range Status  02/26/2013 01:00 PM 117 10 - 291 ng/mL Final   TIBC  Date/Time Value Ref Range Status  02/26/2013 01:00 PM 272 250 - 470 ug/dL Final   IRON  Date/Time Value Ref Range Status  02/26/2013 01:00 PM 37* 42 - 135 ug/dL Final    Echo: Study Date: 07/13/2015 LV EF: 50% -  55% Study Conclusions - Left ventricle: The cavity size was normal. There was severe concentric hypertrophy. Systolic function was normal. The estimated ejection fraction was in the range of 50% to 55%. There is akinesis of the basal-mid myocardium. Doppler parameters are consistent with a reversible restrictive pattern, indicative of decreased left ventricular diastolic compliance and/or increased left atrial pressure (grade 3 diastolic dysfunction). - Mitral valve: Severely calcified annulus. There was moderate regurgitation. Valve area by continuity equation (using LVOT flow): 0.96 cm^2. - Left atrium: The atrium was moderately dilated. - Right ventricle: The cavity size was  mildly dilated. Wall thickness was  normal. - Pulmonary arteries: Systolic pressure was mildly increased. PA peak pressure: 43 mm Hg (S). - Pericardium, extracardiac: A small to moderate pericardial effusion was identified posterior to the heart (1.5cm). Features were not consistent with tamponade physiology.   ECG:  Sinus rhythm with 1st degree A-V block Possible Left atrial enlargement ST & T wave abnormality in inferolateral leads. Similar to previous by possible worsening of lateral ST depression Prolonged QT Since previous tracing 07/24/14   Radiology:  No results found.  ASSESSMENT AND PLAN:    Principal Problem:   Acute encephalopathy Active Problems:   OSA (obstructive sleep apnea)   Hypertensive emergency   Pulmonary hypertension (HCC)   Chronic diastolic CHF (congestive heart failure) (HCC)   COPD (chronic obstructive pulmonary disease) (HCC)   Seizures (HCC)   Elevated troponin   MS (multiple sclerosis) (HCC)   ESRD on dialysis (HCC)  Gail Chapman is a 59 y.o. female with a history of severe HTN, DM, chronic diastolic CHF, pulmonary HTN, pericardial effusion and ESRD on HD who was admitted overnight from [facility] for further evaluation of a seizure yesterday. Her troponin was noted to be elevated ( 0.07-->0.06) and cardiology was consulted.  Elevated troponin- flat and down trending. Non specific in the setting of ESRD (missed HD yesterday bc in hospital), volume overload and seizures. She has had no CP or SOB. She reportedly had a cath with what sounds like non obstructive CAD in [place] in 2012. 2D ECHO today with possible WMA in the basal-midinferior myocardium.  -- We will proceed with lexiscan myoview tomorrow for further evaluation. NPO after midnight.  Acute on chronic diastolic CHF- EF with preserved EF. Evidence of volume overload on ECHO. BNP >4500. Elevated neck veins. Otherwise no other s/s CHF. Needs HD. Per Dr. Arta Silence note, this will be arranged for today ( maybe tomorrow AM  as it is already 5pm?)  HTN - uncontrolled on current regimen. Unclear what medications she came in on and what was added here. Currently taking coreg  BID, lisinopril , cardura , minoxidil  BID. She has IV Lopressor PRN. We will continue this for now and re-evaluate tomorrow after HD. Will make changes as necessary then.   Pericardial effusion- this is not new. No evidence of HD compromise on 2D ECHO today.   ESRD on HD- Dr. Arta Silence following and HD being arranged while she is here.   Pulm HTN- 2D ECHO today with PA peak pressure: 43 mm Hg (S).  SignedAllena Katz 07/13/2015 4:47 PM  Pager [phone]  Co-Sign MD   I have seen and examined the patient along with THOMPSON, KATHRYN R, PA-C.  I have reviewed the chart, notes and new data.  I agree with PA's note.  Key new complaints: never had angina; denies dyspnea currently Key examination changes: prominent JVD, but no rales or S3., no edema; tunneled HD catheter in left upper chest looks healthy. Sever BP elevation Key new findings / data: minimally elevated troponin, no new ECG repol changes. Reviewed echo images and there is a questionable inferior wall abnormality (not evident in SAX, possibly there in A2C view). Normal nuclear stusy and cath in 2008.  PLAN: Suspect demand ischemia in the setting of severe HTN and seizure. Doubt acute coronary event. Lexiscan Myoview to clarify the presence or absence of inferobasal scar. No plan for invasive evaluation unless there is a defect that is both large and reversible. BP is very  high but there is also clinical and echo evidence of marked hypervolemia (4 days since last dialysis, missed Monday). Recent titration of ACEi. Will not change current BP meds until after HD. Note Dr. Prescott Gum plan to gradually wean minoxidil due to the presence of a recurrent pericardial effusion  Thurmon Fair, MD, [facility] [phone] 07/13/2015, 6:09 PM

## 2015-07-13 NOTE — Progress Notes (Signed)
Tried several times to get pt today and now patient is in dialysis- EEG will need to be tomorrow. Nurse aware.

## 2015-07-13 NOTE — Progress Notes (Signed)
EEG completed; results pending.    

## 2015-07-13 NOTE — Progress Notes (Signed)
Patient placed on CPAP of 4. O2 bleed in of 2.5L. Patient tolerating well sat 98%. RT will continue to monitor as needed.

## 2015-07-13 NOTE — Progress Notes (Signed)
  Echocardiogram 2D Echocardiogram has been performed.  Gail Chapman 07/13/2015, 1:52 PM

## 2015-07-13 NOTE — Consult Note (Addendum)
Indication for Consultation:  Management of ESRD/hemodialysis; anemia, hypertension/volume and secondary hyperparathyroidism  HPI: Gail Chapman is a 59 y.o. female who was admitted overnight from [facility] for further evaluation of a seizure yesterday. PMH of HTN, DM, CHF, and ESRD on HD for 2 years. Last HD Friday without any complication. She reports feeling well yesterday morning but doesn't remember events surrounding seizure. Husband reports she was resting on the couch yesterday when he witnessed her have a seizure, episode lasted about 2-3 minutes. She was brought to the ED in [place], she was hypertensive and confused. No acute findings on CT. She was transferred to [facility] for further workup. Will arrange for HD today as she missed yesterday.   Past Medical History  Diagnosis Date  . Diabetes mellitus without complication   . Chronic diastolic CHF (congestive heart failure)     a. 01/2007 MV: No isch/infarct, nl EF;  b. 2012 Cath: "normal" per patient.  Performed in [place], [place] by Dr. Graciela Husbands.  . Pulmonary hypertension     a. on home O2 @ 3lpm 24hrs/day  . HTN (hypertension)     a. Dx in 1993.  Marland Kitchen LVH (left ventricular hypertrophy)   . OSA (obstructive sleep apnea)     a. uses CPAP  . Physical deconditioning   . Renal disorder   . COPD (chronic obstructive pulmonary disease)    Past Surgical History  Procedure Laterality Date  . Knee surgery    . Carpal tunnel release    . Tubal ligation    . Cholecystectomy    . Mass excision      a. under axilla.  . Subxyphoid pericardial window N/A 02/26/2013    Procedure: SUBXYPHOID PERICARDIAL WINDOW;  Surgeon: Delight Ovens, MD;  Location: [facility] OR;  Service: Thoracic;  Laterality: N/A;  . Left and right heart catheterization with coronary angiogram N/A 02/25/2013    Procedure: LEFT AND RIGHT HEART CATHETERIZATION WITH CORONARY ANGIOGRAM;  Surgeon: Dolores Patty, MD;  Location: [facility] CATH LAB;  Service: Cardiovascular;   Laterality: N/A;   Family History  Problem Relation Age of Onset  . Diabetes Mother   . CAD Mother   . Cancer Mother   . Hypertension Mother   . Cancer Father   . Diabetes Father   . Hyperlipidemia Father   . Hypertension Father   . Heart attack Father    Social History:  reports that she has quit smoking. She does not have any smokeless tobacco history on file. She reports that she does not drink alcohol or use illicit drugs. Allergies  Allergen Reactions  . Clonidine Derivatives     Hand itching  . Hydralazine     Visual disturbances   . Labetalol     Fatigue   . Vancomycin Rash    "shut down my kidneys"   Prior to Admission medications   Medication Sig Start Date End Date Taking? Authorizing Provider  aspirin EC 325 MG EC tablet Take 1 tablet (325 mg total) by mouth daily. Patient not taking: Reported on 05/05/2015 03/06/13   Amy D Clegg, NP  carvedilol (COREG) 12.5 MG tablet Take 1 tablet (12.5 mg total) by mouth 2 (two) times daily with a meal. On takes on non dialysis day. Patient not taking: Reported on 05/05/2015 11/24/13   Amy D Clegg, NP  doxazosin (CARDURA) 2 MG tablet Take 1 tablet (2 mg total) by mouth daily. 05/05/15   Dolores Patty, MD  glucose blood (FREESTYLE LITE)  test strip Use as instructed Patient not taking: Reported on 05/05/2015 05/04/14   Dolores Patty, MD  insulin aspart (NOVOLOG) 100 UNIT/ML injection Inject 10 Units into the skin 3 (three) times daily with meals. Per sliding scale Patient not taking: Reported on 05/05/2015 04/23/14   Dolores Patty, MD  insulin detemir (LEVEMIR) 100 UNIT/ML injection Inject 0.04 mLs (4 Units total) into the skin at bedtime. Hold if CBG<150 04/23/14   Dolores Patty, MD  minoxidil (LONITEN) 10 MG tablet Take 0.5 tablets (5 mg total) by mouth 2 (two) times daily. 05/05/15   Dolores Patty, MD  pravastatin (PRAVACHOL) 40 MG tablet Take 1 tablet (40 mg total) by mouth daily. 10/13/14   Dolores Patty, MD   promethazine (PHENERGAN) 25 MG suppository Place 1 suppository (25 mg total) rectally at bedtime. Patient not taking: Reported on 05/05/2015 03/06/13   Amy D Clegg, NP  sevelamer carbonate (RENVELA) 800 MG tablet Take 800 mg by mouth 3 (three) times daily with meals.    Historical Provider, MD   Current Facility-Administered Medications  Medication Dose Route Frequency Provider Last Rate Last Dose  . acetaminophen (TYLENOL) tablet 650 mg  650 mg Oral Q6H PRN Lorretta Harp, MD       Or  . acetaminophen (TYLENOL) suppository 650 mg  650 mg Rectal Q6H PRN Lorretta Harp, MD      . antiseptic oral rinse (CPC / CETYLPYRIDINIUM CHLORIDE 0.05%) solution 7 mL  7 mL Mouth Rinse BID Ripudeep K Rai, MD      . aspirin EC tablet 325 mg  325 mg Oral Daily Ripudeep K Rai, MD      . carvedilol (COREG) tablet 25 mg  25 mg Oral BID WC Lorretta Harp, MD   25 mg at 07/13/15 0900  . doxazosin (CARDURA) tablet 2 mg  2 mg Oral Daily Lorretta Harp, MD      . heparin injection 5,000 Units  5,000 Units Subcutaneous 3 times per day Lorretta Harp, MD   5,000 Units at 07/13/15 0700  . insulin aspart (novoLOG) injection 0-9 Units  0-9 Units Subcutaneous TID WC Lorretta Harp, MD   0 Units at 07/13/15 0800  . insulin glargine (LANTUS) injection 3 Units  3 Units Subcutaneous Daily Lorretta Harp, MD      . lisinopril (PRINIVIL,ZESTRIL) tablet 10 mg  10 mg Oral Daily Ripudeep K Rai, MD      . LORazepam (ATIVAN) injection 0.5-1 mg  0.5-1 mg Intravenous Q1H PRN Lorretta Harp, MD      . metoprolol (LOPRESSOR) injection 2.5 mg  2.5 mg Intravenous Q4H PRN Ripudeep Jenna Luo, MD   2.5 mg at 07/13/15 0839  . minoxidil (LONITEN) tablet 5 mg  5 mg Oral BID Ripudeep K Rai, MD      . ondansetron (ZOFRAN) tablet 4 mg  4 mg Oral Q6H PRN Lorretta Harp, MD       Or  . ondansetron [facility]) injection 4 mg  4 mg Intravenous Q6H PRN Lorretta Harp, MD      . pravastatin (PRAVACHOL) tablet 40 mg  40 mg Oral q1800 Ripudeep K Rai, MD      . sevelamer carbonate (RENVELA) tablet 800 mg  800 mg  Oral TID WC Ripudeep K Rai, MD      . sodium chloride 0.9 % injection 3 mL  3 mL Intravenous Q12H Lorretta Harp, MD   3 mL at 07/12/15 2156   Labs: Basic Metabolic Panel:  Recent Labs Lab  07/13/15 0344  NA 141  K 3.7  CL 105  CO2 26  GLUCOSE 118*  BUN 54*  CREATININE 6.33*  CALCIUM 7.9*   Liver Function Tests:  Recent Labs Lab 07/13/15 0344  AST 13*  ALT 10*  ALKPHOS 47  BILITOT 0.6  PROT 6.8  ALBUMIN 3.2*   No results for input(s): LIPASE, AMYLASE in the last 168 hours. No results for input(s): AMMONIA in the last 168 hours. CBC:  Recent Labs Lab 07/13/15 0344  WBC 10.7*  HGB 9.5*  HCT 30.2*  MCV 90.4  PLT 156   Cardiac Enzymes:  Recent Labs Lab 07/13/15 0344  TROPONINI 0.07*   CBG:  Recent Labs Lab 07/12/15 2011 07/13/15 0808  GLUCAP 94 112*   Iron Studies: No results for input(s): IRON, TIBC, TRANSFERRIN, FERRITIN in the last 72 hours. Studies/Results: No results found.  Review of Systems: Gen: Denies any fever, chills, sweats, anorexia, fatigue, , malaise, weight loss, and sleep disorder. Chronic weakness and requires assist with most ADLs HEENT: No visual complaints, No history of Retinopathy. Normal external appearance No Epistaxis or Sore throat. No sinusitis.   CV: Denies chest pain, angina, palpitations, syncope, orthopnea, PND, peripheral edema, and claudication. Resp: Denies dyspnea at rest, dyspnea with exercise, cough, sputum, wheezing, coughing up blood, and pleurisy. GI: Denies vomiting blood, jaundice, and fecal incontinence.   Denies dysphagia or odynophagia. GU : Denies urinary burning, blood in urine, urinary frequency, urinary hesitancy, nocturnal urination, and urinary incontinence.  No renal calculi. MS: Denies joint pain, limitation of movement, and swelling, stiffness, low back pain, extremity pain. Denies muscle weakness, cramps, atrophy.  No use of non steroidal antiinflammatory drugs. Psych: reports confusion yesterday after  seizure, now resolved.  Heme: Denies bruising, bleeding, and enlarged lymph nodes. Neuro: Reports witnessed seizure yesterday- no previous seizures, Chronic weakness. No headache.  No diplopia. No dysarthria.  No dysphasia.  No history of CVA. Marland Kitchen No paresthesias.   Endocrine - DM.  Physical Exam: Filed Vitals:   07/13/15 0500 07/13/15 0509 07/13/15 0600 07/13/15 0700  BP: 174/82  174/87   Pulse: 88  90   Temp:  98.1 F (36.7 C)  98 F (36.7 C)  TempSrc:  Oral  Oral  Resp: 21  15   Height:      Weight: 67.7 kg (149 lb 4 oz)     SpO2: 100%  99%      General: Well developed, well nourished, in no acute distress. Husband at bedside Head: Normocephalic, atraumatic, sclera non-icteric, mucus membranes are moist Neck: Supple. JVD not elevated. Lungs: Clear bilaterally to auscultation without wheezes, rales, or rhonchi. Breathing is unlabored. Heart: RRR with S1 S2. No murmurs, rubs, or gallops appreciated. Abdomen: Soft, non-tender, non-distended with normoactive bowel sounds. No rebound/guarding. No obvious abdominal masses. M-S:  Strength and tone appear normal for age. Lower extremities:without edema or ischemic changes, no open wounds  Neuro: Alert and oriented X 3. Moves all extremities spontaneously. Psych:  Responds to questions appropriately with a normal affect. Dialysis Access: L IJ cath- no perm access  Dialysis Orders:  Awaiting records from outpt center  Assessment/Plan: 1.  Seizure- Neurology following. CT and MRI no acute findings. EEG pending 2.  ESRD -  MWF [place]. HD pending today. Missed yesterday. 3.  Hypertension/volume  - SBP 170s-180s coreg, lisinopril, minoxidil. She does not know her EDW. Husband says she runs 4-5 hrs depending on how much fluid she gains? 4.  Anemia  - hgb  9.5- await records for information regarding baseline hgb and ESA orders.  5.  Metabolic bone disease -  Cont renvela.  6.  Nutrition - renal diet. alb 3.2 7. DM- per  primary 8. MS-  Jetty Duhamel, NP [facility] [phone] 07/13/2015, 10:58 AM   Pt seen, examined and agree w A/P as above. New onset seizures. Only new medication lately is doxazosin , not likely to be cause of new seizures. Phenergan is listed as home med and this can lower seizure threshold.  Will follow.  HD today.  Vinson Moselle MD pager [phone]    cell [phone] 07/13/2015, 4:01 PM

## 2015-07-13 NOTE — Progress Notes (Signed)
OT Cancellation Note  Patient Details Name: Gail Chapman MRN: [id number] DOB: 01-Sep-1956   Cancelled Treatment:    Reason Eval/Treat Not Completed: Patient at procedure or test/ unavailable (Pt at EEG and then will go to HD per RN.  Will follow.)  Evern Bio 07/13/2015, 2:43 PM

## 2015-07-14 ENCOUNTER — Inpatient Hospital Stay (HOSPITAL_COMMUNITY): Payer: Medicare Other

## 2015-07-14 ENCOUNTER — Other Ambulatory Visit (HOSPITAL_COMMUNITY): Payer: BLUE CROSS/BLUE SHIELD

## 2015-07-14 ENCOUNTER — Encounter (HOSPITAL_COMMUNITY): Payer: Self-pay | Admitting: *Deleted

## 2015-07-14 DIAGNOSIS — G40909 Epilepsy, unspecified, not intractable, without status epilepticus: Secondary | ICD-10-CM

## 2015-07-14 LAB — GLUCOSE, CAPILLARY
GLUCOSE-CAPILLARY: 106 mg/dL — AB (ref 65–99)
GLUCOSE-CAPILLARY: 120 mg/dL — AB (ref 65–99)

## 2015-07-14 LAB — HEMOGLOBIN A1C
HEMOGLOBIN A1C: 5.9 % — AB (ref 4.8–5.6)
MEAN PLASMA GLUCOSE: 123 mg/dL

## 2015-07-14 LAB — BASIC METABOLIC PANEL
ANION GAP: 13 (ref 5–15)
BUN: 59 mg/dL — ABNORMAL HIGH (ref 6–20)
CO2: 25 mmol/L (ref 22–32)
Calcium: 7.9 mg/dL — ABNORMAL LOW (ref 8.9–10.3)
Chloride: 100 mmol/L — ABNORMAL LOW (ref 101–111)
Creatinine, Ser: 7 mg/dL — ABNORMAL HIGH (ref 0.44–1.00)
GFR calc Af Amer: 7 mL/min — ABNORMAL LOW (ref >60)
GFR, EST NON AFRICAN AMERICAN: 6 mL/min — AB (ref >60)
GLUCOSE: 108 mg/dL — AB (ref 65–99)
POTASSIUM: 3.8 mmol/L (ref 3.5–5.1)
Sodium: 138 mmol/L (ref 135–145)

## 2015-07-14 LAB — CBC
HEMATOCRIT: 29.8 % — AB (ref 36.0–46.0)
HEMOGLOBIN: 9.4 g/dL — AB (ref 12.0–15.0)
MCH: 28.6 pg (ref 26.0–34.0)
MCHC: 31.5 g/dL (ref 30.0–36.0)
MCV: 90.6 fL (ref 78.0–100.0)
Platelets: 143 10*3/uL — ABNORMAL LOW (ref 150–400)
RBC: 3.29 MIL/uL — AB (ref 3.87–5.11)
RDW: 16.4 % — ABNORMAL HIGH (ref 11.5–15.5)
WBC: 8.3 10*3/uL (ref 4.0–10.5)

## 2015-07-14 LAB — MRSA PCR SCREENING: MRSA by PCR: NEGATIVE

## 2015-07-14 LAB — HEPATITIS B SURFACE ANTIGEN: Hepatitis B Surface Ag: NEGATIVE

## 2015-07-14 MED ORDER — METOPROLOL TARTRATE 1 MG/ML IV SOLN
5.0000 mg | Freq: Once | INTRAVENOUS | Status: AC
  Administered 2015-07-14: 5 mg via INTRAVENOUS

## 2015-07-14 MED ORDER — METOPROLOL TARTRATE 1 MG/ML IV SOLN
5.0000 mg | Freq: Once | INTRAVENOUS | Status: AC
  Administered 2015-07-14: 5 mg via INTRAVENOUS
  Filled 2015-07-14: qty 5

## 2015-07-14 MED ORDER — SODIUM CHLORIDE 0.9 % IV SOLN: 100.0000 mL | INTRAVENOUS | Status: DC | PRN

## 2015-07-14 MED ORDER — LIDOCAINE-PRILOCAINE 2.5-2.5 % EX CREA: 1.0000 "application " | TOPICAL_CREAM | CUTANEOUS | Status: DC | PRN

## 2015-07-14 MED ORDER — NICARDIPINE HCL IN NACL 20-0.86 MG/200ML-% IV SOLN
3.0000 mg/h | INTRAVENOUS | Status: DC
  Administered 2015-07-14: 5 mg/h via INTRAVENOUS
  Filled 2015-07-14 (×2): qty 200

## 2015-07-14 MED ORDER — HEPARIN SODIUM (PORCINE) 1000 UNIT/ML DIALYSIS: 1000.0000 [IU] | INTRAMUSCULAR | Status: DC | PRN

## 2015-07-14 MED ORDER — DOXAZOSIN MESYLATE 4 MG PO TABS
4.0000 mg | ORAL_TABLET | Freq: Every day | ORAL | Status: DC
  Administered 2015-07-14: 4 mg via ORAL
  Filled 2015-07-14 (×2): qty 1

## 2015-07-14 MED ORDER — LIDOCAINE HCL (PF) 1 % IJ SOLN: 5.0000 mL | INTRAMUSCULAR | Status: DC | PRN

## 2015-07-14 MED ORDER — METOPROLOL TARTRATE 1 MG/ML IV SOLN
5.0000 mg | INTRAVENOUS | Status: DC | PRN
  Administered 2015-07-14 – 2015-07-18 (×5): 5 mg via INTRAVENOUS
  Filled 2015-07-14 (×5): qty 5

## 2015-07-14 MED ORDER — PENTAFLUOROPROP-TETRAFLUOROETH EX AERO: 1.0000 "application " | INHALATION_SPRAY | CUTANEOUS | Status: DC | PRN

## 2015-07-14 MED ORDER — ALTEPLASE 2 MG IJ SOLR: 2.0000 mg | Freq: Once | INTRAMUSCULAR | Status: DC | PRN

## 2015-07-14 MED ORDER — LISINOPRIL 20 MG PO TABS
20.0000 mg | ORAL_TABLET | Freq: Every day | ORAL | Status: DC
  Administered 2015-07-14: 20 mg via ORAL
  Filled 2015-07-14 (×2): qty 1

## 2015-07-14 NOTE — Progress Notes (Addendum)
[facility]                                                                              Patient Demographics  Gail Chapman, is a 59 y.o. female, DOB - 1956-10-04, ZSW:[id number]  Admit date - 07/12/2015   Admitting Physician Lorretta Harp, MD  Outpatient Primary MD for the patient is No PCP Per Patient  LOS - 2  Chief complaint: Dizziness with seizures  Brief HPI  Gail Chapman is a 59 y.o. female with PMH of MS, chronic bilateral leg weakness (using wheelchair), hypertension, hyperlipidemia, diabetes mellitus, diastolic congestive heart failure, pulmonary hypertension, OSA on CPAP, COPD on 3 L oxygen at home, and end-stage renal disease-HD (MWF), who presented with altered mental status, dizziness, seizure. Pt was transferred from [facility] for evaluation after brought to the ED there following a witnessed seizure.  Per patient's husband, patient became confused after using bathroom at 10:00 AM on the day of admission. She also had dizziness and aphasia, subsequently noticed to have rigidity in both arms and deviated gaze to the right. The episode lasted for about 2 to 3 min. She was brought to [facility] ED where she was found to have elevated bp at 260/130 and elevated trop at 0.46. She was started with Cardizem drip in the emergency room. Pt did not have chest pain. She had mild cough, no sputum production, no fever or chills. She missed HD which was her regular day on Monday 10/3. Patient had no prior history of seizure disorder or previous seizure activity.  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  # MRI of brain showed no acute intracranial findings, but showed extensive white matter disease which has progressed significantly compared with previous examination, with involvement of the pons and basal ganglia as  well as the periventricular and subcortical white matter. Patient also had regions of old lacunar infarct involving the cerebral hemisphere bilaterally. Patient has atrophy and ventriculomegaly which is progressed compared to the previous MRI. In [facility] ED, patient was found to have troponin 0.46, INR 1.2, PTT 60, negative urinalysis, WBC 9.18, temperature normal, no tachycardia, creatinine 5.84, BUN 46. Chest x-ray negative for acute abnormalities.    Assessment & Plan    Principal Problem:   Acute encephalopathy, seizures in the setting of underlying MS, ESRD, hypertensive encephalopathy. No prior history of seizures - MRI of the brain at [facility] did not show acute stroke. EEG showing focal sharp wave activity in the frontal region on the left with phase reversal at F7. This finding is suggestive of a focal disturbance with epileptogenic potential. Started on Depakote by neurology - Patient seen again, second time after hemodialysis after called by RN, pt lethargic and not responding. Patient is somewhat somnolent however is able to grip, needs multiple verbal commands, moving all extremities. Sats 100%. BP is still elevated over 200. Husband at the bedside. She is slowly improving. - Neurology also saw the patient at bedside with me, recommended stat CT head  and EEG - if CT head is negative, will place on cardene drip to bring the BP down below 200 (goal around 160's), renal (Dr Arta Silence) ok  With the Cardene drip. However she has not received any of her oral meds except lisinopril this morning, went for hemodialysis, returned extremely somnolent, unable to give her oral meds.   Active Problems: Hypertensive emergency and elevated trop: Bp was initially 260/130 on admission and trop 0.46. Per husband, BP is chronically elevated always despite multiple antihypertensives. -  No chest pain. Troponin 0.07.  - 2-D echo showed EF of 50-55%, grade 3 diastolic dysfunction, small to  moderate pericardial effusion, moderate MR, cardiology consulted and following. Plan was to do stress test today however canceled due to uncontrolled hypertension. - I had titrated up Cardura and lisinopril this morning however patient had only received lisinopril so far and multiple doses of IV Lopressor. Continue Coreg, minoxidil, await further cardiology recommendations regarding antihypertensives. Patient is too lethargic to be on oral medications or diet. Addendum: 2:20PM Cardiology, Dr Royann Shivers  recommended starting patient on Cardene drip and transfer to ICU. Nephrology, Dr. Arlean Hopping also recommending Cardene drip. Called PCCM, Dr. Molli Knock, will evaluate patient prior to transfer.  OSA: -CPAP qhs  Chronic diastolic CHF (congestive heart failure): 2-D echo 05/05/15 showed EF of 55-60% with grade 3 diastolic dysfunction. Patient does not have leg edema. - Volume management per hemodialysis  MS (multiple sclerosis): Has chronic bilateral leg edema. her last MS flare was in 2010. Currently not on medications. -follow up neurologist further recommendations.  ESRD-HD (MWF): missed HD on Monday.  - Nephrology following, hemodialysis today   Code Status: Full code   Family Communication: Discussed in detail with the patient, all imaging results, lab results explained to the patient and husband at the bedside  Disposition Plan: monitor in SDU  Time Spent in minutes   25 minutes  Procedures  None   Consults   Neurology Nephrology  DVT Prophylaxis  heparin subcutaneous  Medications  Scheduled Meds: . antiseptic oral rinse  7 mL Mouth Rinse BID  . aspirin EC  325 mg Oral Daily  . calcitRIOL  0.25 mcg Oral Q M,W,F-HD  . carvedilol  25 mg Oral BID WC  . darbepoetin (ARANESP) injection - DIALYSIS  60 mcg Intravenous Once  . divalproex  500 mg Oral Q12H  . doxazosin  4 mg Oral Daily  . heparin  5,000 Units Subcutaneous 3 times per day  . insulin aspart  0-9 Units Subcutaneous TID WC    . insulin glargine  3 Units Subcutaneous Daily  . lisinopril  20 mg Oral Daily  . minoxidil  5 mg Oral BID  . pravastatin  40 mg Oral q1800  . regadenoson  0.4 mg Intravenous Once  . sevelamer carbonate  800 mg Oral TID WC  . sodium chloride  3 mL Intravenous Q12H   Continuous Infusions:  PRN Meds:.sodium chloride, sodium chloride, acetaminophen **OR** acetaminophen, alteplase, heparin, lidocaine (PF), lidocaine-prilocaine, LORazepam, metoprolol, ondansetron **OR** ondansetron (ZOFRAN) IV, pentafluoroprop-tetrafluoroeth   Antibiotics   Anti-infectives    None        Subjective:   Gail Chapman was seen and examined today.  patient seen twice this morning, once before hemodialysis and second time after hemodialysis. In a.m., patient was alert and oriented, no complaints. However after hemodialysis, patient was much more somnolent, slow to respond needing multiple verbal commands but slowly coming around. She is complaining of nausea. Husband at bedside. BP still very  uncontrolled.  Objective:   Blood pressure 184/97, pulse 70, temperature 98.4 F (36.9 C), temperature source Oral, resp. rate 18, height 5' 1.5" (1.562 m), weight 69.7 kg (153 lb 10.6 oz), SpO2 100 %.  Wt Readings from Last 3 Encounters:  07/14/15 69.7 kg (153 lb 10.6 oz)  05/05/15 74.844 kg (165 lb)  08/28/14 76.658 kg (169 lb)     Intake/Output Summary (Last 24 hours) at 07/14/15 1244 Last data filed at 07/14/15 1214  Gross per 24 hour  Intake      0 ml  Output   6075 ml  Net  -6075 ml    Exam  General:Now somnolent, NAD, needing multiple verbal commands, does not respond to questions verbally  HEENT:  PERRLA, EOMI, Anicteric Sclera, mucous membranes moist.   Neck: Supple, no JVD, no masses  CVS: S1 S2 auscultated, no rubs, murmurs or gallops. Regular rate and rhythm.  Respiratory: CTAB  Abdomen: Soft, nontender, nondistended, + bowel sounds  Ext: no cyanosis clubbing or edema  Neuro: slow  to respond, moving all 4 extremities   Skin: No rashes  Psych:somnolent and lethargic  Data Review   Micro Results Recent Results (from the past 240 hour(s))  MRSA PCR Screening     Status: None   Collection Time: 07/12/15  7:40 PM  Result Value Ref Range Status   MRSA by PCR NEGATIVE NEGATIVE Final    Comment:        The GeneXpert MRSA Assay (FDA approved for NASAL specimens only), is one component of a comprehensive MRSA colonization surveillance program. It is not intended to diagnose MRSA infection nor to guide or monitor treatment for MRSA infections.     Radiology Reports No results found.  CBC  Recent Labs Lab 07/13/15 0344 07/14/15 0327  WBC 10.7* 8.3  HGB 9.5* 9.4*  HCT 30.2* 29.8*  PLT 156 143*  MCV 90.4 90.6  MCH 28.4 28.6  MCHC 31.5 31.5  RDW 16.3* 16.4*    Chemistries   Recent Labs Lab 07/13/15 0344 07/14/15 0327  NA 141 138  K 3.7 3.8  CL 105 100*  CO2 26 25  GLUCOSE 118* 108*  BUN 54* 59*  CREATININE 6.33* 7.00*  CALCIUM 7.9* 7.9*  AST 13*  --   ALT 10*  --   ALKPHOS 47  --   BILITOT 0.6  --    ------------------------------------------------------------------------------------------------------------------ estimated creatinine clearance is 8 mL/min (by C-G formula based on Cr of 7). ------------------------------------------------------------------------------------------------------------------  Recent Labs  07/13/15 0344  HGBA1C 5.9*   ------------------------------------------------------------------------------------------------------------------  Recent Labs  07/13/15 0344  CHOL 171  HDL 67  LDLCALC 93  TRIG 54  CHOLHDL 2.6   ------------------------------------------------------------------------------------------------------------------ No results for input(s): TSH, T4TOTAL, T3FREE, THYROIDAB in the last 72 hours.  Invalid input(s):  FREET3 ------------------------------------------------------------------------------------------------------------------ No results for input(s): VITAMINB12, FOLATE, FERRITIN, TIBC, IRON, RETICCTPCT in the last 72 hours.  Coagulation profile No results for input(s): INR, PROTIME in the last 168 hours.  No results for input(s): DDIMER in the last 72 hours.  Cardiac Enzymes  Recent Labs Lab 07/13/15 0344 07/13/15 1128 07/13/15 1610  TROPONINI 0.07* 0.06* 0.05*   ------------------------------------------------------------------------------------------------------------------ Invalid input(s): POCBNP   Recent Labs  07/12/15 2011 07/13/15 0808 07/13/15 1150 07/13/15 1639 07/13/15 2115 07/14/15 1237  GLUCAP 94 112* 126* 81 121* 106*     RAI,RIPUDEEP M.D. [facility] 07/14/2015, 12:44 PM  Pager: [phone] Between 7am to 7pm - call Pager - [phone]  After 7pm go to [email or web address] - password  [email or web address]  Call night coverage person covering after 7pm

## 2015-07-14 NOTE — Progress Notes (Signed)
rec'd pt from dialysis noted pt more lethargic than prior to going to dialysis, pt not responding to commands, does not withdrawal to painful stimuli, sternal rub not responding, pupils size 3 reacting to light. SBP remains in the 200's, cbg 106. Paged Rapid response, dr Isidoro Donning, who mentioned to call neuro, neuro contacted. Will come assess pt.

## 2015-07-14 NOTE — Progress Notes (Signed)
lexiscan will not be done today due to patients bp still being elevated. Will reschedule for tomorrow morning.

## 2015-07-14 NOTE — Progress Notes (Signed)
Paged cardiology per dr Isidoro Donning. New orders rec'd from Wilburt Finlay PA to give lopressor 5mg  iv now,

## 2015-07-14 NOTE — Consult Note (Signed)
Name: Gail Chapman MRN: [id number] DOB: Jun 09, 1956    ADMISSION DATE:  07/12/2015 CONSULTATION DATE:  10/5  REFERRING MD :  Isidoro Donning ([facility])   CHIEF COMPLAINT:  HTN crisis   BRIEF PATIENT DESCRIPTION:  59yo female with hx DM, uncontrolled HTN, CHF, COPD, OSA, ESRD (MWF) initially admitted with confusion and probable seizure.  She was seen by neuro and started on depakote.  On 10/5 after HD (with 6L removed) she had episode of acute AMS (would follow some commands but very slow to respond, took multiple cues to follow one simple command) and acutely increased BP with SBP 200.  She was tx to ICU and PCCM consulted.   SIGNIFICANT EVENTS    STUDIES:  EEG 10/4>>> focal sharp wave activity in L frontal region suggestive of focal disturbance  MRI brain 10/5>>> neg acute.  Progressive extensive white matter disease  HISTORY OF PRESENT ILLNESS:  59yo female with hx DM, uncontrolled HTN, CHF, COPD, OSA, ESRD (MWF) initially admitted with confusion and probable seizure.  She was seen by neuro and started on depakote.  On 10/5 after HD (with 6L removed) she had episode of acute AMS (would follow some commands but very slow to respond, took multiple cues to follow one simple command) and acutely increased BP with SBP 200.  She was tx to ICU and PCCM consulted.    Pt currently in ICU, no distress.  Follows commands, answers questions.  She says her blood pressure runs 200/100 normally.  Denies chest pain, headache, visual disturbances, lightheadedness.    PAST MEDICAL HISTORY :   has a past medical history of Diabetes mellitus without complication (HCC); Chronic diastolic CHF (congestive heart failure) (HCC); Pulmonary hypertension (HCC); HTN (hypertension); LVH (left ventricular hypertrophy); OSA (obstructive sleep apnea); Physical deconditioning; Renal disorder; and COPD (chronic obstructive pulmonary disease) (HCC).  has past surgical history that includes Knee surgery; Carpal tunnel release; Tubal  ligation; Cholecystectomy; Mass excision; Subxyphoid pericardial window (N/A, 02/26/2013); and left and right heart catheterization with coronary angiogram (N/A, 02/25/2013). Prior to Admission medications   Medication Sig Start Date End Date Taking? Authorizing Provider  aspirin EC 325 MG EC tablet Take 1 tablet (325 mg total) by mouth daily. 03/06/13  Yes Amy D Clegg, NP  carvedilol (COREG) 12.5 MG tablet Take 1 tablet (12.5 mg total) by mouth 2 (two) times daily with a meal. On takes on non dialysis day. 11/24/13  Yes Amy D Clegg, NP  doxazosin (CARDURA) 2 MG tablet Take 1 tablet (2 mg total) by mouth daily. 05/05/15  Yes Dolores Patty, MD  insulin aspart (NOVOLOG) 100 UNIT/ML injection Inject 10 Units into the skin 3 (three) times daily with meals. Per sliding scale 04/23/14  Yes Dolores Patty, MD  insulin detemir (LEVEMIR) 100 UNIT/ML injection Inject 0.04 mLs (4 Units total) into the skin at bedtime. Hold if CBG<150 04/23/14  Yes Dolores Patty, MD  glucose blood (FREESTYLE LITE) test strip Use as instructed Patient not taking: Reported on 05/05/2015 05/04/14   Dolores Patty, MD  promethazine (PHENERGAN) 25 MG suppository Place 1 suppository (25 mg total) rectally at bedtime. Patient not taking: Reported on 05/05/2015 03/06/13   Sherald Hess, NP   Allergies  Allergen Reactions  . Clonidine Derivatives     Hand itching  . Hydralazine     Visual disturbances   . Labetalol     Fatigue   . Vancomycin Rash    "shut down my kidneys"  FAMILY HISTORY:  family history includes CAD in her mother; Cancer in her father and mother; Diabetes in her father and mother; Heart attack in her father; Hyperlipidemia in her father; Hypertension in her father and mother. SOCIAL HISTORY:  reports that she has quit smoking. She does not have any smokeless tobacco history on file. She reports that she does not drink alcohol or use illicit drugs.  REVIEW OF SYSTEMS:   As per HPI - All other  systems reviewed and were neg.    SUBJECTIVE:   VITAL SIGNS: Temp:  [98.1 F (36.7 C)-99 F (37.2 C)] 98.2 F (36.8 C) (10/05 1300) Pulse Rate:  [63-94] 74 (10/05 1500) Resp:  [15-33] 22 (10/05 1500) BP: (184-232)/(70-112) 203/87 mmHg (10/05 1500) SpO2:  [95 %-100 %] 100 % (10/05 1500) Weight:  [153 lb 10.6 oz (69.7 kg)-168 lb 10.4 oz (76.5 kg)] 153 lb 10.6 oz (69.7 kg) (10/05 1214)  PHYSICAL EXAMINATION: General:  Chronically ill appearing female, NAD in bed  Neuro:  Awake, alert, slow to respond at times but overall appears alert and oriented, follows commands, appropriate, MAE  HEENT:  Mm moist, no JVD  Cardiovascular:  s1s2 rrr, L chest tunneled cath c/d  Lungs:  resps even non labored on [place], few scattered crackles, otherwise clear  Abdomen:  Round, soft, +bs  Musculoskeletal:  Warm and dry, scant BLE edema     Recent Labs Lab 07/13/15 0344 07/14/15 0327  NA 141 138  K 3.7 3.8  CL 105 100*  CO2 26 25  BUN 54* 59*  CREATININE 6.33* 7.00*  GLUCOSE 118* 108*    Recent Labs Lab 07/13/15 0344 07/14/15 0327  HGB 9.5* 9.4*  HCT 30.2* 29.8*  WBC 10.7* 8.3  PLT 156 143*   Ct Head Wo Contrast  07/14/2015   CLINICAL DATA:  Acute onset confusion and lethargy after dialysis. Chronic renal failure.  EXAM: CT HEAD WITHOUT CONTRAST  TECHNIQUE: Contiguous axial images were obtained from the base of the skull through the vertex without intravenous contrast.  COMPARISON:  None.  FINDINGS: Moderate diffuse atrophy is present. There is no intracranial mass, hemorrhage, extra-axial fluid collection, midline shift. There is small vessel disease throughout the centra semiovale bilaterally. There are age uncertain fairly small infarcts in the anterior left occipital and anterior superior left frontal lobes. There is a small age uncertain infarct in the superior posterior right cerebellum. There is also a small age uncertain infarct in the posterior right temporal lobe lateral and  slightly posterior to the temporal horn the right lateral ventricle. There is a small age uncertain infarct in the right lentiform nucleus. Bony calvarium appears intact. The mastoid air cells are clear. There is mild mucosal thickening of several ethmoid air cells bilaterally.  IMPRESSION: Atrophy with extensive periventricular small vessel disease. Age uncertain small infarcts at several sites as described. No hemorrhage or mass effect. Mild ethmoid sinus disease bilaterally.   Electronically Signed   By: Bretta Bang III M.D.   On: 07/14/2015 14:25    ASSESSMENT / PLAN:  HTN urgency- per pt and husband her BP tends to run high (lives ~200 systolic per pt).  Multiple drug intolerances.  Denies chest pain.   Elevated troponin  Acute on chronic diastolic CHF - preserved EF.   PLAN -  Continue lisinopril, coreg, cardura, minoxidil  Will try PO rx first (she was not given these prior to HD and then they were held r/t AMS)  If no improvement with PO's will  add cardene gtt - titrate as able for goal SBP 170 Consider re-try clonidine or labetalol given relatively benign reactions (fatigue and hand itching) especially in acute setting  Trend troponin  Volume removal with HD as able  Cards following    AMS - improved.  ?r/t HTN and HD although she has significant HTN at baseline. ? Repeat seizure.   Seizure  PLAN -  CT head and stat EEG pending  BP control  Continue depakote    ESRD  PLAN -  HD per renal  F/u chem   Keep in ICU for now, suspect can tx back out in am if BP remains controlled.    Dirk Dress, NP 07/14/2015  3:42 PM Pager: [phone] or [phone]  Attending Note:  59 year old female with PMH of ESRD on HD and hypertensive heart disease who presents to the hospital after missing dialysis with a hypertensive emergency and new onset seizure. Had some AMS and neurology was consulted. Depakote was started. On 10/5 patient was transferred to the ICU for a  cardene drip after being unable to control her BP in SDU. Per [facility]-MD patient mental status also deteriorated on 10/5 and PCCM was consulted.   I reviewed CXR myself, cardiomegally noted.  Discussed with PCCM-NP, TRH-MD and PCCM-NP.  Severe HTN: - Start PO agents. - Put cardene on hold for now. - PO norvasc ordered. - PO clonidine ordered.  AMS: likely due to HTN. - Control BP. - Avoid sedating medications. - Monitor closely in the ICU.  Seizure disorder: - Depakoate. - Neurology following.  ESRD: - HD per renal. - Limit fluid intake.  Will hold in ICU overnight, if able to get off cardene will send back to SDU in AM.  The patient is critically ill with multiple organ systems failure and requires high complexity decision making for assessment and support, frequent evaluation and titration of therapies, application of advanced monitoring technologies and extensive interpretation of multiple databases.   Critical Care Time devoted to patient care services described in this note is 35 Minutes. This time reflects time of care of this signee Dr [name]. This critical care time does not reflect procedure time, or teaching time or supervisory time of PA/NP/Med student/Med Resident etc but could involve care discussion time.  Alyson Reedy, M.D. [facility] Pulmonary/Critical Care Medicine. Pager: [phone]. After hours pager: [phone].

## 2015-07-14 NOTE — Progress Notes (Signed)
Dr. Royann Shivers returned paged, recommends pt be placed on cardene gtt and transferred to icu

## 2015-07-14 NOTE — Procedures (Signed)
Pt placed on CPAP for the night.  Pt is resting comfortably and tolerating well.

## 2015-07-14 NOTE — Progress Notes (Signed)
EEG Completed; Results Pending  

## 2015-07-14 NOTE — Progress Notes (Signed)
Patient arrived on unit. She is alert and oriented to self and able to follow commands. Pupils are equal and reactive. Husband is waiting in the waiting area.

## 2015-07-14 NOTE — Procedures (Signed)
ELECTROENCEPHALOGRAM REPORT   Patient: Gail Chapman       Room #: 3H54 EEG No. ID: [id number] Age: 59 y.o.        Sex: female Referring Physician: Molli Knock Report Date:  07/14/2015        Interpreting Physician: Thana Farr  History: Gail Chapman is an 59 y.o. female with seizure and altered mental status  Medications:  Scheduled: . antiseptic oral rinse  7 mL Mouth Rinse BID  . aspirin EC  325 mg Oral Daily  . calcitRIOL  0.25 mcg Oral Q M,W,F-HD  . carvedilol  25 mg Oral BID WC  . darbepoetin (ARANESP) injection - DIALYSIS  60 mcg Intravenous Once  . divalproex  500 mg Oral Q12H  . doxazosin  4 mg Oral Daily  . heparin  5,000 Units Subcutaneous 3 times per day  . insulin aspart  0-9 Units Subcutaneous TID WC  . insulin glargine  3 Units Subcutaneous Daily  . lisinopril  20 mg Oral Daily  . minoxidil  5 mg Oral BID  . pravastatin  40 mg Oral q1800  . regadenoson  0.4 mg Intravenous Once  . sevelamer carbonate  800 mg Oral TID WC  . sodium chloride  3 mL Intravenous Q12H    Conditions of Recording:  This is a 16 channel EEG carried out with the patient in the awake and drowsy states.  Description:  With wakefulness the waking background activity while eyes open exhibits a healthy low voltage alpha rhythm with some intermittent polymorphic delta activity noted.  When the patient closes her eyes alpha activity becomes much less prominent and the background is dominated by a moderate voltage polymorphic delta frequency that at times is quite rhythmic.  Also noted intermittently is sharp activity from the left hemisphere with phase reversal at [facility].  At times there is spread of this activity noted with the sharp activity being more generalized.  Independently there is noted sharp activity on a rare occasion on the right hemisphere, again originating frontally.   There is no evidence of stage II sleep. Hyperventilation and intermittent photic stimulation were not  performed.   IMPRESSION: This is an abnormal EEG secondary to focal sharp wave activity in the frontal regions bilaterally (left more than right) with phase reversal at [facility] most predominantly. This finding is suggestive of a focal disturbance with epileptogenic potential.   Thana Farr, MD [facility] [phone] 07/14/2015, 6:15 PM

## 2015-07-14 NOTE — Progress Notes (Signed)
PT Cancellation Note  Patient Details Name: Gail Chapman MRN: [id number] DOB: [birth year]   Cancelled Treatment:    Reason Eval/Treat Not Completed: Patient at procedure or test/unavailable; patient currently in HD and planned for [facility] today as well as currently on strict bedrest.  Will attempt again as appropriate.   Sorrel Cassetta,CYNDI 07/14/2015, 9:10 AM  Sheran Lawless, PT [phone] 07/14/2015

## 2015-07-14 NOTE — Progress Notes (Signed)
OT Cancellation Note  Patient Details Name: Gail Chapman MRN: [id number] DOB: 1956/06/04   Cancelled Treatment:    Reason Eval/Treat Not Completed: Patient at procedure or test/ unavailable (HD) Ot to continue to check on patient as appropriate.   Boone Master B 07/14/2015, 8:50 AM   Mateo Flow   OTR/L Pager: [phone] Office: [phone] .

## 2015-07-14 NOTE — Progress Notes (Signed)
Subjective: patient has been in HD all morning. On return from hemodialysis (removed 6 L fluid) she was noted to be less responsive and not communicative.  Currently she is slowly coming around but still needing multiple verbal commands to follow a single command.  Currently holding a emesis basin and nauseated.  Moving all extremities. BP 210 systolically  Objective: Current vital signs: BP 184/97 mmHg  Pulse 70  Temp(Src) 98.4 F (36.9 C) (Oral)  Resp 18  Ht 5' 1.5" (1.562 m)  Wt 69.7 kg (153 lb 10.6 oz)  BMI 28.57 kg/m2  SpO2 100% Vital signs in last 24 hours: Temp:  [98.1 F (36.7 C)-99 F (37.2 C)] 98.4 F (36.9 C) (10/05 1214) Pulse Rate:  [63-94] 70 (10/05 1214) Resp:  [16-33] 18 (10/05 0730) BP: (184-232)/(70-112) 184/97 mmHg (10/05 1214) SpO2:  [95 %-100 %] 100 % (10/05 1214) Weight:  [69.7 kg (153 lb 10.6 oz)-76.5 kg (168 lb 10.4 oz)] 69.7 kg (153 lb 10.6 oz) (10/05 1214)  Intake/Output from previous day: 10/04 0701 - 10/05 0700 In: -  Out: 77 [Urine:76; Stool:1] Intake/Output this shift: Total I/O In: -  Out: 6000 [Other:6000] Nutritional status: Diet NPO time specified Except for: Sips with Meds  Neurologic Exam: General: NAD Mental Status: lethargic.  Not verbal but able to follow commands.  Able to follow simple commands but needs multiple ques. Cranial Nerves: II:  Visual fields grossly normal, pupils equal, round, reactive to light and accommodation III,IV, VI: ptosis not present, extra-ocular motions intact bilaterally V,VII: smile symmetric, facial light touch sensation normal bilaterally VIII: hearing normal bilaterally IX,X: uvula rises symmetrically XI: bilateral shoulder shrug XII: midline tongue extension without atrophy or fasciculations  Motor: Moving extremities antigravity Sensory: Pinprick and light touch intact throughout, bilaterally Deep Tendon Reflexes:  Depressed throughout  Plantars: Mute bilaterally     Lab Results: Basic  Metabolic Panel:  Recent Labs Lab 07/13/15 0344 07/14/15 0327  NA 141 138  K 3.7 3.8  CL 105 100*  CO2 26 25  GLUCOSE 118* 108*  BUN 54* 59*  CREATININE 6.33* 7.00*  CALCIUM 7.9* 7.9*    Liver Function Tests:  Recent Labs Lab 07/13/15 0344  AST 13*  ALT 10*  ALKPHOS 47  BILITOT 0.6  PROT 6.8  ALBUMIN 3.2*   No results for input(s): LIPASE, AMYLASE in the last 168 hours. No results for input(s): AMMONIA in the last 168 hours.  CBC:  Recent Labs Lab 07/13/15 0344 07/14/15 0327  WBC 10.7* 8.3  HGB 9.5* 9.4*  HCT 30.2* 29.8*  MCV 90.4 90.6  PLT 156 143*    Cardiac Enzymes:  Recent Labs Lab 07/13/15 0344 07/13/15 1128 07/13/15 1610  TROPONINI 0.07* 0.06* 0.05*    Lipid Panel:  Recent Labs Lab 07/13/15 0344  CHOL 171  TRIG 54  HDL 67  CHOLHDL 2.6  VLDL 11  LDLCALC 93    CBG:  Recent Labs Lab 07/13/15 0808 07/13/15 1150 07/13/15 1639 07/13/15 2115 07/14/15 1237  GLUCAP 112* 126* 81 121* 106*    Microbiology: Results for orders placed or performed during the hospital encounter of 07/12/15  MRSA PCR Screening     Status: None   Collection Time: 07/12/15  7:40 PM  Result Value Ref Range Status   MRSA by PCR NEGATIVE NEGATIVE Final    Comment:        The GeneXpert MRSA Assay (FDA approved for NASAL specimens only), is one component of a comprehensive MRSA colonization surveillance program. It  is not intended to diagnose MRSA infection nor to guide or monitor treatment for MRSA infections.     Coagulation Studies: No results for input(s): LABPROT, INR in the last 72 hours.  Imaging: No results found.  Medications:  Scheduled: . antiseptic oral rinse  7 mL Mouth Rinse BID  . aspirin EC  325 mg Oral Daily  . calcitRIOL  0.25 mcg Oral Q M,W,F-HD  . carvedilol  25 mg Oral BID WC  . darbepoetin (ARANESP) injection - DIALYSIS  60 mcg Intravenous Once  . divalproex  500 mg Oral Q12H  . doxazosin  4 mg Oral Daily  . heparin   5,000 Units Subcutaneous 3 times per day  . insulin aspart  0-9 Units Subcutaneous TID WC  . insulin glargine  3 Units Subcutaneous Daily  . lisinopril  20 mg Oral Daily  . minoxidil  5 mg Oral BID  . pravastatin  40 mg Oral q1800  . regadenoson  0.4 mg Intravenous Once  . sevelamer carbonate  800 mg Oral TID WC  . sodium chloride  3 mL Intravenous Q12H    Assessment/Plan: 59 YO female admitted with new onset seizure. Placed on Depakote yesterday due to abnormal EEG showing focal sharps in left frontal region.  Normal this AM but upon returning from dialysis and having 6 liters taken off she now is very lethargic and following minimal commands. She is progressively improving but due to seizure history and new onset altered mental status we will order CT head and EEG STAT. Likely AMS is secondary to HD and elevated BP but per husband her BP does tend to run high.   Will follow up.  Felicie Morn PA-C [facility] [phone]  07/14/2015, 1:02 PM

## 2015-07-14 NOTE — Progress Notes (Signed)
Subjective: No CP.  She said she woke up SOB.  Objective: Vital signs in last 24 hours: Temp:  [98.1 F (36.7 C)-99 F (37.2 C)] 98.5 F (36.9 C) (10/05 0730) Pulse Rate:  [63-94] 66 (10/05 0900) Resp:  [10-33] 18 (10/05 0730) BP: (161-232)/(70-128) 204/106 mmHg (10/05 0900) SpO2:  [95 %-100 %] 98 % (10/05 0730) Weight:  [76.4 kg (168 lb 6.9 oz)-76.5 kg (168 lb 10.4 oz)] 76.4 kg (168 lb 6.9 oz) (10/05 0730) Last BM Date: 07/13/15  Intake/Output from previous day: 10/04 0701 - 10/05 0700 In: -  Out: 77 [Urine:76; Stool:1] Intake/Output this shift:    Medications Scheduled Meds: . antiseptic oral rinse  7 mL Mouth Rinse BID  . aspirin EC  325 mg Oral Daily  . calcitRIOL  0.25 mcg Oral Q M,W,F-HD  . carvedilol  25 mg Oral BID WC  . darbepoetin (ARANESP) injection - DIALYSIS  60 mcg Intravenous Once  . divalproex  500 mg Oral Q12H  . doxazosin  4 mg Oral Daily  . heparin  5,000 Units Subcutaneous 3 times per day  . insulin aspart  0-9 Units Subcutaneous TID WC  . insulin glargine  3 Units Subcutaneous Daily  . lisinopril  20 mg Oral Daily  . minoxidil  5 mg Oral BID  . pravastatin  40 mg Oral q1800  . regadenoson  0.4 mg Intravenous Once  . sevelamer carbonate  800 mg Oral TID WC  . sodium chloride  3 mL Intravenous Q12H   Continuous Infusions:  PRN Meds:.sodium chloride, sodium chloride, acetaminophen **OR** acetaminophen, alteplase, heparin, lidocaine (PF), lidocaine-prilocaine, LORazepam, metoprolol, ondansetron **OR** ondansetron (ZOFRAN) IV, pentafluoroprop-tetrafluoroeth  PE: General appearance: alert, cooperative and no distress Lungs: Decreased BS on the left(anteriorly) Heart: regular rate and rhythm, S1, S2 normal, no murmur, click, rub or gallop Extremities: No LEE Pulses: 2+ and symmetric Skin: warm and dry Neurologic: Alert and oriented.   Lab Results:   Recent Labs  07/13/15 0344 07/14/15 0327  WBC 10.7* 8.3  HGB 9.5* 9.4*  HCT 30.2* 29.8*   PLT 156 143*   BMET  Recent Labs  07/13/15 0344 07/14/15 0327  NA 141 138  K 3.7 3.8  CL 105 100*  CO2 26 25  GLUCOSE 118* 108*  BUN 54* 59*  CREATININE 6.33* 7.00*  CALCIUM 7.9* 7.9*   PT/INR No results for input(s): LABPROT, INR in the last 72 hours. Cholesterol  Recent Labs  07/13/15 0344  CHOL 171   Lipid Panel     Component Value Date/Time   CHOL 171 07/13/2015 0344   TRIG 54 07/13/2015 0344   HDL 67 07/13/2015 0344   CHOLHDL 2.6 07/13/2015 0344   VLDL 11 07/13/2015 0344   LDLCALC 93 07/13/2015 0344    Cardiac Panel (last 3 results)  Recent Labs  07/13/15 0344 07/13/15 1128 07/13/15 1610  TROPONINI 0.07* 0.06* 0.05*    Assessment/Plan  Principal Problem:   Acute encephalopathy Active Problems:   OSA (obstructive sleep apnea)   Hypertensive emergency   Pulmonary hypertension (HCC)   Chronic diastolic CHF (congestive heart failure) (HCC)   COPD (chronic obstructive pulmonary disease) (HCC)   Seizures (HCC)   Elevated troponin   MS (multiple sclerosis) (HCC)   ESRD on dialysis (HCC)   Acute on chronic diastolic heart failure (HCC)   Gail Chapman is a 59 y.o. female with a history of severe HTN, DM, chronic diastolic CHF, pulmonary HTN, pericardial effusion and ESRD on HD who  was admitted overnight from [facility] for further evaluation of a seizure yesterday. Her troponin was noted to be elevated ( 0.07-->0.06) and cardiology was consulted.  Elevated troponin- flat and down trending. Non specific in the setting of ESRD (missed HD yesterday bc in hospital), volume overload and seizures. She has had no CP or SOB. She reportedly had a cath with what sounds like non obstructive CAD in [place] in 2012. 2D ECHO today with possible WMA in the basal-midinferior myocardium.   Lexiscan cardiolite today.  Acute on chronic diastolic CHF- EF with preserved EF.  G3DD.  Evidence of volume overload on ECHO. BNP >4500. Elevated neck veins.  Otherwise no other s/s CHF.   At HD now.   HTN - uncontrolled on current regimen. Lisinopril increased to 20mg  this morning by Dr. Isidoro Donning.  I asked the RN in HD to give it to her now.  Continue to titrate.  She get "visual disturbances" from hydralazine.  Also on coreg 25mg  BID, cardura 2mg , minoxidil 5mg  BID. She has IV Lopressor PRN. We will   Pericardial effusion- this is not new. No evidence of HD compromise on 2D ECHO today.   ESRD on HD- Dr. Arta Silence following and HD being arranged while she is here.   Pulm HTN- 2D ECHO: EF 50-55% with PA peak pressure: 43 mm Hg (S).    LOS: 2 days    HAGER, BRYAN PA-C 07/14/2015 9:27 AM  I have seen and examined the patient along with HAGER, BRYAN PA-C.  I have reviewed the chart, notes and new data.  I agree with PA/NP's note.  Key new complaints: no angina; dyspnea is already improving with dialysis Key examination changes: JVP still up; BP still severely elevated Key new findings / data: minimally elevated troponin, downward trend  PLAN: Reevaluate BP meds after HD. Lexiscan Myoview rescheduled for tomorrow.  Thurmon Fair, MD, [facility] [phone] 07/14/2015, 11:44 AM

## 2015-07-14 NOTE — Progress Notes (Signed)
BP overnight ranged from 191-211/81-104 with HR 60-90s.  Pt asymptomatic and stated BP is normally elevated.  Hemodialysis moved to 10/5 morning.  Administered PRN metoprolol and received multiple orders of metoprolol without much changes to BP.  Contacted hemodialysis and the pt is first on the list for this morning.  Will continue to monitor pt.

## 2015-07-14 NOTE — Progress Notes (Signed)
Subjective:   SOB when she woke up, improving with HD  Objective Filed Vitals:   07/14/15 0830 07/14/15 0900 07/14/15 0930 07/14/15 1000  BP: 226/70 204/106 206/106 204/105  Pulse: 66 66 67 68  Temp:      TempSrc:      Resp:      Height:      Weight:      SpO2:       Physical Exam General: alert and oriented, no acute distress Heart: RRR Lungs: CTA, unlabored Abdomen: soft, nontender +BS  Extremities: no edema Dialysis Access:  Cath patent on HD  Dialysis Orders: MWF [facility] 4 hrs   70.5kgs    2K/2.5CA   L IJ cath Micera 75q 2 weeks Calcitriol 0.25    Assessment/Plan: 1. Seizure- Neurology following. CT and MRI no acute findings. Abnormal EEG- finding suggestive of focal disturbance with epilptogenic potential - depakote started 2. Elevated troponin- cardiology following. lexiscan today. ECHO yesterday. EF 50-55% 3. ESRD - MWF [place]. HD today.  4. Hypertension/volume - SBP 200- max UF/ coreg, lisinopril, minoxidil. 6kgs over edw 5. Anemia - hgb 9.4- on micera outpt- start Aranesp here 6. Metabolic bone disease - Cont renvela. And calcitriol per outpt orders 7. Nutrition - NPO  alb 3.2 8. DM- per primary 9. MS  Jetty Duhamel, NP [facility] Beeper [phone] 07/14/2015,10:31 AM  LOS: 2 days   Pt seen, examined and agree w A/P as above.  Appears to have focal changes on EEG, started on depakote.  Plan HD today.   Vinson Moselle MD pager [phone]    cell [phone] 07/14/2015, 11:47 AM     Additional Objective Labs: Basic Metabolic Panel:  Recent Labs Lab 07/13/15 0344 07/14/15 0327  NA 141 138  K 3.7 3.8  CL 105 100*  CO2 26 25  GLUCOSE 118* 108*  BUN 54* 59*  CREATININE 6.33* 7.00*  CALCIUM 7.9* 7.9*   Liver Function Tests:  Recent Labs Lab 07/13/15 0344  AST 13*  ALT 10*  ALKPHOS 47  BILITOT 0.6  PROT 6.8  ALBUMIN 3.2*   No results for input(s): LIPASE, AMYLASE in the last 168 hours. CBC:  Recent  Labs Lab 07/13/15 0344 07/14/15 0327  WBC 10.7* 8.3  HGB 9.5* 9.4*  HCT 30.2* 29.8*  MCV 90.4 90.6  PLT 156 143*   Blood Culture    Component Value Date/Time   SDES PERICARDIAL FLUID 02/26/2013 1557   SPECREQUEST NONE 02/26/2013 1557   CULT NO GROWTH 3 DAYS 02/26/2013 1557   REPTSTATUS 03/02/2013 FINAL 02/26/2013 1557    Cardiac Enzymes:  Recent Labs Lab 07/13/15 0344 07/13/15 1128 07/13/15 1610  TROPONINI 0.07* 0.06* 0.05*   CBG:  Recent Labs Lab 07/12/15 2011 07/13/15 0808 07/13/15 1150 07/13/15 1639 07/13/15 2115  GLUCAP 94 112* 126* 81 121*   Iron Studies: No results for input(s): IRON, TIBC, TRANSFERRIN, FERRITIN in the last 72 hours. @ Studies/Results: No results found. Medications:   . antiseptic oral rinse  7 mL Mouth Rinse BID  . aspirin EC  325 mg Oral Daily  . calcitRIOL  0.25 mcg Oral Q M,W,F-HD  . carvedilol  25 mg Oral BID WC  . darbepoetin (ARANESP) injection - DIALYSIS  60 mcg Intravenous Once  . divalproex  500 mg Oral Q12H  . doxazosin  4 mg Oral Daily  . heparin  5,000 Units Subcutaneous 3 times per day  . insulin aspart  0-9 Units Subcutaneous TID WC  .  insulin glargine  3 Units Subcutaneous Daily  . lisinopril  20 mg Oral Daily  . minoxidil  5 mg Oral BID  . pravastatin  40 mg Oral q1800  . regadenoson  0.4 mg Intravenous Once  . sevelamer carbonate  800 mg Oral TID WC  . sodium chloride  3 mL Intravenous Q12H

## 2015-07-15 ENCOUNTER — Inpatient Hospital Stay (HOSPITAL_COMMUNITY): Payer: Medicare Other

## 2015-07-15 DIAGNOSIS — J42 Unspecified chronic bronchitis: Secondary | ICD-10-CM

## 2015-07-15 DIAGNOSIS — R569 Unspecified convulsions: Secondary | ICD-10-CM

## 2015-07-15 LAB — BASIC METABOLIC PANEL
Anion gap: 11 (ref 5–15)
BUN: 31 mg/dL — ABNORMAL HIGH (ref 6–20)
CALCIUM: 8.4 mg/dL — AB (ref 8.9–10.3)
CO2: 25 mmol/L (ref 22–32)
CREATININE: 4.55 mg/dL — AB (ref 0.44–1.00)
Chloride: 101 mmol/L (ref 101–111)
GFR calc non Af Amer: 10 mL/min — ABNORMAL LOW (ref >60)
GFR, EST AFRICAN AMERICAN: 11 mL/min — AB (ref >60)
Glucose, Bld: 97 mg/dL (ref 65–99)
Potassium: 4.2 mmol/L (ref 3.5–5.1)
Sodium: 137 mmol/L (ref 135–145)

## 2015-07-15 LAB — CBC
HCT: 33.2 % — ABNORMAL LOW (ref 36.0–46.0)
Hemoglobin: 10.5 g/dL — ABNORMAL LOW (ref 12.0–15.0)
MCH: 28.7 pg (ref 26.0–34.0)
MCHC: 31.6 g/dL (ref 30.0–36.0)
MCV: 90.7 fL (ref 78.0–100.0)
PLATELETS: 151 10*3/uL (ref 150–400)
RBC: 3.66 MIL/uL — AB (ref 3.87–5.11)
RDW: 16 % — ABNORMAL HIGH (ref 11.5–15.5)
WBC: 10.6 10*3/uL — ABNORMAL HIGH (ref 4.0–10.5)

## 2015-07-15 LAB — GLUCOSE, CAPILLARY
Glucose-Capillary: 92 mg/dL (ref 65–99)
Glucose-Capillary: 104 mg/dL — ABNORMAL HIGH (ref 65–99)

## 2015-07-15 LAB — HEPATITIS B SURFACE ANTIBODY,QUALITATIVE: HEP B S AB: REACTIVE

## 2015-07-15 LAB — TROPONIN I
TROPONIN I: 0.05 ng/mL — AB (ref <0.031)
Troponin I: 0.06 ng/mL — ABNORMAL HIGH (ref <0.031)
Troponin I: 0.06 ng/mL — ABNORMAL HIGH (ref <0.031)

## 2015-07-15 MED ORDER — PANTOPRAZOLE SODIUM 40 MG IV SOLR
40.0000 mg | INTRAVENOUS | Status: DC
  Administered 2015-07-15 – 2015-07-22 (×8): 40 mg via INTRAVENOUS
  Filled 2015-07-15 (×9): qty 40

## 2015-07-15 MED ORDER — SODIUM CHLORIDE 0.9 % IV BOLUS (SEPSIS)
500.0000 mL | Freq: Once | INTRAVENOUS | Status: AC
  Administered 2015-07-15: 500 mL via INTRAVENOUS

## 2015-07-15 MED ORDER — DEXTROSE 50 % IV SOLN
25.0000 mL | Freq: Once | INTRAVENOUS | Status: AC
  Administered 2015-07-15: 25 mL via INTRAVENOUS
  Filled 2015-07-15: qty 50

## 2015-07-15 MED ORDER — VALPROATE SODIUM 500 MG/5ML IV SOLN
500.0000 mg | Freq: Two times a day (BID) | INTRAVENOUS | Status: DC
  Administered 2015-07-15 – 2015-07-20 (×10): 500 mg via INTRAVENOUS
  Filled 2015-07-15 (×14): qty 5

## 2015-07-15 NOTE — Progress Notes (Signed)
PT Cancellation Note  Patient Details Name: Gail Chapman MRN: [id number] DOB: 1956-05-28   Cancelled Treatment:    Reason Eval/Treat Not Completed: Medical issues which prohibited therapy. Discussed pt case with RN who states that pt demonstrating L-sided deficits today and MRI is pending to r/o neuro involvement. Will hold PT eval today and check back for medical readiness to participate.    Conni Slipper 07/15/2015, 10:43 AM   Conni Slipper, PT, DPT Acute Rehabilitation Services Pager: [phone]

## 2015-07-15 NOTE — Progress Notes (Addendum)
Subjective:   Had 6kg removed with HD yesterday and finished around 12 noon.  She got minoxidil and coreg then at 4 pm and at 5 pm became poorly responsive and lethargic w large BP drop from 200/80's to 115/50.  Was transferred to ICU.  Cardene drip was ordered but didn't get much if any as BP's were already dropping by then.  Today BP's still soft and pt still groggy but responsive.  Objective Filed Vitals:   07/15/15 0400 07/15/15 0500 07/15/15 0600 07/15/15 0749  BP: 104/53 105/51 93/44   Pulse: 69 67 69   Temp:    99 F (37.2 C)  TempSrc:    Oral  Resp: Height:      Weight:      SpO2: 96% 98% 97%    Physical Exam General: alert, slow responses Heart: RRR Lungs: CTA, unlabored Abdomen: soft, nontender +BS  Extremities: no edema Dialysis Access:  Cath patent on HD  Dialysis Orders: MWF [facility] 4 hrs   70.5kgs    2K/2.5CA   L IJ Cath (does not have any perm access)  Heparin none Micera 75q 2 weeks Calcitriol 0.25    Assessment/Plan: 1. Seizure- on depakote, EEG +, per neuro 2. Altered mental status - agree that rapid BP lowering (from po meds and large HD UF yesterday) is probable cause of lethargy.  She has had long-standing severe HTN apparently and is not tolerating these lower BP's right now. Have d/w primary team and they are planning fluid boluses to get BP up.  Anti-HTN'sives on hold for now. Hold off on stress test until BP stabilized.  Prob need to let BP's run on high side, she said her normal is "200/100" yesterday to staff when more coherent. 3. Elevated troponin- cardiology following. ECHO yesterday. EF 50-55% 4. ESRD - MWF [place]. HD Friday. 5. Hypertension/volume - BP's low, meds on hold as above 6. Anemia - hgb 9.4- on micera outpt- start Aranesp here 7. Metabolic bone disease - Cont renvela. And calcitriol per outpt orders 8. Nutrition - NPO  alb 3.2 9. DM- per primary   Vinson Moselle MD pager [phone]    cell [phone] 07/15/2015,  10:50 AM     Additional Objective Labs: Basic Metabolic Panel:  Recent Labs Lab 07/13/15 0344 07/14/15 0327 07/15/15 0337  NA 141 138 137  K 3.7 3.8 4.2  CL 105 100* 101  CO2 GLUCOSE 118* 108* 97  BUN 54* 59* 31*  CREATININE 6.33* 7.00* 4.55*  CALCIUM 7.9* 7.9* 8.4*   Liver Function Tests:  Recent Labs Lab 07/13/15 0344  AST 13*  ALT 10*  ALKPHOS 47  BILITOT 0.6  PROT 6.8  ALBUMIN 3.2*   No results for input(s): LIPASE, AMYLASE in the last 168 hours. CBC:  Recent Labs Lab 07/13/15 0344 07/14/15 0327 07/15/15 0337  WBC 10.7* 8.3 10.6*  HGB 9.5* 9.4* 10.5*  HCT 30.2* 29.8* 33.2*  MCV 90.4 90.6 90.7  PLT 156 143* 151   Blood Culture    Component Value Date/Time   SDES PERICARDIAL FLUID 02/26/2013 1557   SPECREQUEST NONE 02/26/2013 1557   CULT NO GROWTH 3 DAYS 02/26/2013 1557   REPTSTATUS 03/02/2013 FINAL 02/26/2013 1557    Cardiac Enzymes:  Recent Labs Lab 07/13/15 0344 07/13/15 1128 07/13/15 1610 07/15/15 0834  TROPONINI 0.07* 0.06* 0.05* 0.06*   CBG:  Recent Labs Lab 07/13/15 1150 07/13/15 1639 07/13/15 2115 07/14/15 1237 07/14/15 1745  GLUCAP 126* 81 121* 106* 120*   Iron Studies: No results for input(s): IRON, TIBC, TRANSFERRIN, FERRITIN in the last 72 hours. @ Studies/Results: Ct Head Wo Contrast  07/14/2015   CLINICAL DATA:  Acute onset confusion and lethargy after dialysis. Chronic renal failure.  EXAM: CT HEAD WITHOUT CONTRAST  TECHNIQUE: Contiguous axial images were obtained from the base of the skull through the vertex without intravenous contrast.  COMPARISON:  None.  FINDINGS: Moderate diffuse atrophy is present. There is no intracranial mass, hemorrhage, extra-axial fluid collection, midline shift. There is small vessel disease throughout the centra semiovale bilaterally. There are age uncertain fairly small infarcts in the anterior left occipital and anterior superior left frontal lobes. There is a  small age uncertain infarct in the superior posterior right cerebellum. There is also a small age uncertain infarct in the posterior right temporal lobe lateral and slightly posterior to the temporal horn the right lateral ventricle. There is a small age uncertain infarct in the right lentiform nucleus. Bony calvarium appears intact. The mastoid air cells are clear. There is mild mucosal thickening of several ethmoid air cells bilaterally.  IMPRESSION: Atrophy with extensive periventricular small vessel disease. Age uncertain small infarcts at several sites as described. No hemorrhage or mass effect. Mild ethmoid sinus disease bilaterally.   Electronically Signed   By: Bretta Bang III M.D.   On: 07/14/2015 14:25   Dg Chest Port 1 View  07/15/2015   CLINICAL DATA:  E86.9 (ICD-10-CM) - Volume depletion  EXAM: PORTABLE CHEST 1 VIEW  COMPARISON:  08/28/2014  FINDINGS: Left-sided dialysis catheter tip to the upper right atrium. Cardiopericardial silhouette is enlarged and stable. Lungs are clear. No pulmonary edema.  IMPRESSION: No significant change in marked enlargement of the cardiopericardial silhouette.   Electronically Signed   By: Norva Pavlov M.D.   On: 07/15/2015 10:20   Medications: . niCARDipine Stopped (07/14/15 1800)   . antiseptic oral rinse  7 mL Mouth Rinse BID  . aspirin EC  325 mg Oral Daily  . calcitRIOL  0.25 mcg Oral Q M,W,F-HD  . darbepoetin (ARANESP) injection - DIALYSIS  60 mcg Intravenous Once  . heparin  5,000 Units Subcutaneous 3 times per day  . insulin aspart  0-9 Units Subcutaneous TID WC  . insulin glargine  3 Units Subcutaneous Daily  . pantoprazole (PROTONIX) IV  40 mg Intravenous Q24H  . pravastatin  40 mg Oral q1800  . regadenoson  0.4 mg Intravenous Once  . sevelamer carbonate  800 mg Oral TID WC  . sodium chloride  3 mL Intravenous Q12H  . valproate sodium  500 mg Intravenous Q12H

## 2015-07-15 NOTE — Progress Notes (Signed)
Upon initial assessment, prior to pts scheduled nuclear med study, pt was found to be extremely lethargic. Family med Resident called to beside with RN. Neuro examination done. Neurology called to bedside. Neruo exam performed. Pt was more awake and more responsive. MD order for MRI. Will continue to monitor pt and pts neurological status.

## 2015-07-15 NOTE — Progress Notes (Signed)
PULMONARY / CRITICAL CARE MEDICINE   Name: Gail Chapman MRN: [id number] DOB: 07-27-1956    ADMISSION DATE:  07/12/2015 CONSULTATION DATE:  07/14/2015  REFERRING MD :  Isidoro Donning ([facility])  CHIEF COMPLAINT:  HTN crisis  INITIAL PRESENTATION: 59yo female with hx DM, uncontrolled HTN, CHF, COPD, OSA, ESRD (MWF) initially admitted with confusion and probable seizure. She was seen by neuro and started on depakote. On 10/5 after HD (with 6L removed) she had episode of acute AMS (would follow some commands but very slow to respond, took multiple cues to follow one simple command) and acutely increased BP with SBP 200. She was tx to ICU for cardene drip and PCCM consulted.   STUDIES:  EEG 10/4>>> focal sharp wave activity in L frontal region suggestive of focal disturbance  MRI brain 10/5>>> neg acute. Progressive extensive white matter disease eeg 10/5>>>focal sharp wave activity in the frontal regions bilaterally (left more than right)  SIGNIFICANT EVENTS: 07/12/2015 >>> Admit for encephalopathy and probable seizure 07/14/2015 >>> Transferred to ICU for cardene drip for hypertensive urgency  SUBJECTIVE:  Awake, follows some commands  VITAL SIGNS: Temp:  [98.2 F (36.8 C)-99.1 F (37.3 C)] 99.1 F (37.3 C) (10/06 0340) Pulse Rate:  [66-80] 69 (10/06 0600) Resp:  [14-29] 29 (10/06 0600) BP: (93-229)/(42-112) 93/44 mmHg (10/06 0600) SpO2:  [96 %-100 %] 97 % (10/06 0600) Weight:  [137 lb 2 oz (62.2 kg)-153 lb 10.6 oz (69.7 kg)] 137 lb 2 oz (62.2 kg) (10/06 0137) HEMODYNAMICS:   VENTILATOR SETTINGS:   INTAKE / OUTPUT:  Intake/Output Summary (Last 24 hours) at 07/15/15 0731 Last data filed at 07/14/15 2247  Gross per 24 hour  Intake      3 ml  Output   6000 ml  Net  -5997 ml    PHYSICAL EXAMINATION: General:  Chronically ill appearing female, NAD Neuro:  Awake, follows some commands, mumbles some words, does not move LUE HEENT:  PERRL Cardiovascular:  RRR Lungs:  CTAB Abdomen:   +BS, S, NT, ND Musculoskeletal:  Trace BLE edema Skin:  Warm, dry  LABS:  CBC  Recent Labs Lab 07/13/15 0344 07/14/15 0327 07/15/15 0337  WBC 10.7* 8.3 10.6*  HGB 9.5* 9.4* 10.5*  HCT 30.2* 29.8* 33.2*  PLT 156 143* 151   Coag's No results for input(s): APTT, INR in the last 168 hours. BMET  Recent Labs Lab 07/13/15 0344 07/14/15 0327 07/15/15 0337  NA 141 138 137  K 3.7 3.8 4.2  CL 105 100* 101  CO2 26 25 25   BUN 54* 59* 31*  CREATININE 6.33* 7.00* 4.55*  GLUCOSE 118* 108* 97   Electrolytes  Recent Labs Lab 07/13/15 0344 07/14/15 0327 07/15/15 0337  CALCIUM 7.9* 7.9* 8.4*   Sepsis Markers No results for input(s): LATICACIDVEN, PROCALCITON, O2SATVEN in the last 168 hours. ABG No results for input(s): PHART, PCO2ART, PO2ART in the last 168 hours. Liver Enzymes  Recent Labs Lab 07/13/15 0344  AST 13*  ALT 10*  ALKPHOS 47  BILITOT 0.6  ALBUMIN 3.2*   Cardiac Enzymes  Recent Labs Lab 07/13/15 0344 07/13/15 1128 07/13/15 1610  TROPONINI 0.07* 0.06* 0.05*   Glucose  Recent Labs Lab 07/13/15 0808 07/13/15 1150 07/13/15 1639 07/13/15 2115 07/14/15 1237 07/14/15 1745  GLUCAP 112* 126* 81 121* 106* 120*    Imaging Ct Head Wo Contrast  07/14/2015   CLINICAL DATA:  Acute onset confusion and lethargy after dialysis. Chronic renal failure.  EXAM: CT HEAD WITHOUT CONTRAST  TECHNIQUE:  Contiguous axial images were obtained from the base of the skull through the vertex without intravenous contrast.  COMPARISON:  None.  FINDINGS: Moderate diffuse atrophy is present. There is no intracranial mass, hemorrhage, extra-axial fluid collection, midline shift. There is small vessel disease throughout the centra semiovale bilaterally. There are age uncertain fairly small infarcts in the anterior left occipital and anterior superior left frontal lobes. There is a small age uncertain infarct in the superior posterior right cerebellum. There is also a small age  uncertain infarct in the posterior right temporal lobe lateral and slightly posterior to the temporal horn the right lateral ventricle. There is a small age uncertain infarct in the right lentiform nucleus. Bony calvarium appears intact. The mastoid air cells are clear. There is mild mucosal thickening of several ethmoid air cells bilaterally.  IMPRESSION: Atrophy with extensive periventricular small vessel disease. Age uncertain small infarcts at several sites as described. No hemorrhage or mass effect. Mild ethmoid sinus disease bilaterally.   Electronically Signed   By: Bretta Bang III M.D.   On: 07/14/2015 14:25     ASSESSMENT / PLAN:  PULMONARY A: COPD OSA Protecting airway P:   CPAP qhs  CARDIOVASCULAR A: Hypertensive urgency - resolved, now relatively hypotensive Chronic diastolic CHF Elevated troponin P:  Off cardene drip Hold oral anti hypertensives Volume management via dialysis - not volume overloaded Trend troponin MAp highest pre ICU 155- 25% reduction = 100-120, may need now pressors , if MAP not improved after fluids (goal MAP 75-80 in setting new neuro changes)  RENAL A:  ESRD on dialysis P:   HD MWF Daily BMP See cvs, bolus  GASTROINTESTINAL A:  No active issues P:  Npo ppi  HEMATOLOGIC A:  Anemia of chronic disease P:  Trend CBC Sub q heparin   INFECTIOUS A:  No active issues P:  Follow fever curve  ENDOCRINE A:  T2DM   P:   Monitor CBGs, SSI  NEUROLOGIC A:  Acute encephalopathy ?Delerium Seizure MS NEW neurochange thi sam 10/6 - r/o MAP too low causing poor autoregulation vs todds pralaysis P:   Bolus, see cvs Neurology consulted - repeat MRI planned MAp goal to increase 75-80 Continue depakote   FAMILY  - Updates:    Caleb M. Jimmey Ralph, MD [facility] Resident PGY-2 07/15/2015 7:31 AM    STAFF NOTE: Cindi Carbon, MD FACP have personally reviewed patient's available data, including medical history,  events of note, physical examination and test results as part of my evaluation. I have discussed with resident/NP and other care providers such as pharmacist, RN and RRT. In addition, I personally evaluated patient and elicited key findings of: this am left upper weakness, more lethargic, MAP is low, push MAP to 75-80, concern poor autoregulation, may need line, pressors to this goal, did have 6 liters off yesterday, protecting airway fine, pcxr for volume status now, renal following, off nicardipine, keep npo, for MRI, is this todds paralysis?, need depakote The patient is critically ill with multiple organ systems failure and requires high complexity decision making for assessment and support, frequent evaluation and titration of therapies, application of advanced monitoring technologies and extensive interpretation of multiple databases.   Critical Care Time devoted to patient care services described in this note is 30 Minutes. This time reflects time of care of this signee: Rory Percy, MD FACP. This critical care time does not reflect procedure time, or teaching time or supervisory time of PA/NP/Med student/Med Resident etc but  could involve care discussion time. Rest per NP/medical resident whose note is outlined above and that I agree with   Mcarthur Rossetti. Tyson Alias, MD, FACP Pgr: [phone] [facility] 07/15/2015 9:14 AM

## 2015-07-15 NOTE — Progress Notes (Signed)
Patient Name: Gail Chapman Date of Encounter: 07/15/2015  Principal Problem:   Acute encephalopathy Active Problems:   OSA (obstructive sleep apnea)   Hypertensive emergency   Pulmonary hypertension (HCC)   Chronic diastolic CHF (congestive heart failure) (HCC)   COPD (chronic obstructive pulmonary disease) (HCC)   Seizures (HCC)   Elevated troponin   MS (multiple sclerosis) (HCC)   ESRD on dialysis (HCC)   Acute on chronic diastolic heart failure (HCC)   Length of Stay: 3  SUBJECTIVE  Starting to wake up, understands questions and is mostly oriented. BP still quite low for her. Reportedly, EEG again showed seizures.  CURRENT MEDS . antiseptic oral rinse  7 mL Mouth Rinse BID  . aspirin EC  325 mg Oral Daily  . calcitRIOL  0.25 mcg Oral Q M,W,F-HD  . darbepoetin (ARANESP) injection - DIALYSIS  60 mcg Intravenous Once  . heparin  5,000 Units Subcutaneous 3 times per day  . insulin aspart  0-9 Units Subcutaneous TID WC  . insulin glargine  3 Units Subcutaneous Daily  . pantoprazole (PROTONIX) IV  40 mg Intravenous Q24H  . pravastatin  40 mg Oral q1800  . regadenoson  0.4 mg Intravenous Once  . sevelamer carbonate  800 mg Oral TID WC  . sodium chloride  500 mL Intravenous Once  . sodium chloride  3 mL Intravenous Q12H  . valproate sodium  500 mg Intravenous Q12H    OBJECTIVE   Intake/Output Summary (Last 24 hours) at 07/15/15 1246 Last data filed at 07/14/15 2247  Gross per 24 hour  Intake      3 ml  Output      0 ml  Net      3 ml   Filed Weights   07/14/15 0730 07/14/15 1214 07/15/15 0137  Weight: 168 lb 6.9 oz (76.4 kg) 153 lb 10.6 oz (69.7 kg) 137 lb 2 oz (62.2 kg)    PHYSICAL EXAM Filed Vitals:   07/15/15 1000 07/15/15 1100 07/15/15 1200 07/15/15 1213  BP: 118/59 105/52 118/58   Pulse: 69 68 66   Temp:    98.9 F (37.2 C)  TempSrc:    Oral  Resp: Height:      Weight:      SpO2: 100% 100% 100%    General: Alert, oriented x3, no  distress, a little sluggish Head: no evidence of trauma, PERRL, EOMI, no exophtalmos or lid lag, no myxedema, no xanthelasma; normal ears, nose and oropharynx Neck: normal jugular venous pulsations and no hepatojugular reflux; brisk carotid pulses without delay and no carotid bruits Chest: clear to auscultation, no signs of consolidation by percussion or palpation, normal fremitus, symmetrical and full respiratory excursions Cardiovascular: normal position and quality of the apical impulse, regular rhythm, normal first and second heart sounds, no rubs or gallops, no murmur Abdomen: no tenderness or distention, no masses by palpation, no abnormal pulsatility or arterial bruits, normal bowel sounds, no hepatosplenomegaly Extremities: no clubbing, cyanosis or edema; 2+ radial, ulnar and brachial pulses bilaterally; 2+ right femoral, posterior tibial and dorsalis pedis pulses; 2+ left femoral, posterior tibial and dorsalis pedis pulses; no subclavian or femoral bruits Neurological: left arm weakness  LABS  CBC  Recent Labs  07/14/15 0327 07/15/15 0337  WBC 8.3 10.6*  HGB 9.4* 10.5*  HCT 29.8* 33.2*  MCV 90.6 90.7  PLT 143* 151   Basic Metabolic Panel  Recent Labs  07/14/15 0327 07/15/15 0337  NA 138 137  K 3.8 4.2  CL 100* 101  CO2 25 25  GLUCOSE 108* 97  BUN 59* 31*  CREATININE 7.00* 4.55*  CALCIUM 7.9* 8.4*   Liver Function Tests  Recent Labs  07/13/15 0344  AST 13*  ALT 10*  ALKPHOS 47  BILITOT 0.6  PROT 6.8  ALBUMIN 3.2*   No results for input(s): LIPASE, AMYLASE in the last 72 hours. Cardiac Enzymes  Recent Labs  07/13/15 1128 07/13/15 1610 07/15/15 0834  TROPONINI 0.06* 0.05* 0.06*   BNP Invalid input(s): POCBNP D-Dimer No results for input(s): DDIMER in the last 72 hours. Hemoglobin A1C  Recent Labs  07/13/15 0344  HGBA1C 5.9*   Fasting Lipid Panel  Recent Labs  07/13/15 0344  CHOL 171  HDL 67  LDLCALC 93  TRIG 54  CHOLHDL 2.6    Thyroid Function Tests No results for input(s): TSH, T4TOTAL, T3FREE, THYROIDAB in the last 72 hours.  Invalid input(s): FREET3  Radiology Studies Imaging results have been reviewed and Ct Head Wo Contrast  07/14/2015   CLINICAL DATA:  Acute onset confusion and lethargy after dialysis. Chronic renal failure.  EXAM: CT HEAD WITHOUT CONTRAST  TECHNIQUE: Contiguous axial images were obtained from the base of the skull through the vertex without intravenous contrast.  COMPARISON:  None.  FINDINGS: Moderate diffuse atrophy is present. There is no intracranial mass, hemorrhage, extra-axial fluid collection, midline shift. There is small vessel disease throughout the centra semiovale bilaterally. There are age uncertain fairly small infarcts in the anterior left occipital and anterior superior left frontal lobes. There is a small age uncertain infarct in the superior posterior right cerebellum. There is also a small age uncertain infarct in the posterior right temporal lobe lateral and slightly posterior to the temporal horn the right lateral ventricle. There is a small age uncertain infarct in the right lentiform nucleus. Bony calvarium appears intact. The mastoid air cells are clear. There is mild mucosal thickening of several ethmoid air cells bilaterally.  IMPRESSION: Atrophy with extensive periventricular small vessel disease. Age uncertain small infarcts at several sites as described. No hemorrhage or mass effect. Mild ethmoid sinus disease bilaterally.   Electronically Signed   By: Bretta Bang III M.D.   On: 07/14/2015 14:25   Dg Chest Port 1 View  07/15/2015   CLINICAL DATA:  E86.9 (ICD-10-CM) - Volume depletion  EXAM: PORTABLE CHEST 1 VIEW  COMPARISON:  08/28/2014  FINDINGS: Left-sided dialysis catheter tip to the upper right atrium. Cardiopericardial silhouette is enlarged and stable. Lungs are clear. No pulmonary edema.  IMPRESSION: No significant change in marked enlargement of the  cardiopericardial silhouette.   Electronically Signed   By: Norva Pavlov M.D.   On: 07/15/2015 10:20    TELE NSR  ASSESSMENT AND PLAN  Reschedule Lexiscan myoview for next week. She has a possible inferior wall akinesis area by echo that should be evaluated (not urgent). Cautiously restart her BP meds (ideally no minoxidil) after her BP recovers. No clinical evidence of tamponade.   Thurmon Fair, MD, [facility] [phone] office [phone] pager 07/15/2015 12:46 PM

## 2015-07-15 NOTE — Progress Notes (Addendum)
NEURO HOSPITALIST PROGRESS NOTE   SUBJECTIVE:                                                                                                                        Nurse report that patient was very lethargic and no moving the left side on earlier assessment today. No further clinical seizures noted. Presently, she is able to answer questions appropriately and follows commands consistently although still with less motor function in the left arm-leg. Repeat EEG yesterday showed no electrographic seizures but focal sharp wave activity in the frontal regions bilaterally (left more than right) with phase reversal at [facility] most predominantly. CT brain yesterday was personally reviewed and showed no acute abnormality. She is on Depakote 500 mg BID  Last known well: unable to determine tpa not given due to unknown time of onset.  OBJECTIVE:                                                                                                                           Vital signs in last 24 hours: Temp:  [98.2 F (36.8 C)-99.1 F (37.3 C)] 99 F (37.2 C) (10/06 0749) Pulse Rate:  [66-80] 69 (10/06 0600) Resp:  [14-29] 29 (10/06 0600) BP: (93-223)/(42-112) 93/44 mmHg (10/06 0600) SpO2:  [96 %-100 %] 97 % (10/06 0600) Weight:  [62.2 kg (137 lb 2 oz)-69.7 kg (153 lb 10.6 oz)] 62.2 kg (137 lb 2 oz) (10/06 0137)  Intake/Output from previous day: 10/05 0701 - 10/06 0700 In: 3 [I.V.:3] Out: 6000  Intake/Output this shift:   Nutritional status: Diet clear liquid Room service appropriate?: Yes; Fluid consistency:: Thin  Past Medical History  Diagnosis Date  . Diabetes mellitus without complication (HCC)   . Chronic diastolic CHF (congestive heart failure) (HCC)     a. 01/2007 MV: No isch/infarct, nl EF;  b. 2012 Cath: "normal" per patient.  Performed in [place], [place] by Dr. Graciela Husbands.  . Pulmonary hypertension (HCC)     a. on home O2 @ 3lpm 24hrs/day  . HTN  (hypertension)     a. Dx in 1993.  Marland Kitchen LVH (left ventricular hypertrophy)   . OSA (obstructive sleep apnea)     a. uses CPAP  . Physical deconditioning   .  Renal disorder   . COPD (chronic obstructive pulmonary disease) (HCC)     Neurologic Exam:  General: NAD Mental Status: Open eyes to verbal stimuli, is oriented x 3. Able to follow simple commands but needs multiple ques. Cranial Nerves: II: Visual fields grossly normal, pupils equal, round, reactive to light and accommodation III,IV, VI: ptosis not present, extra-ocular motions intact bilaterally V,VII: smile symmetric, facial light touch sensation normal bilaterally VIII: hearing normal bilaterally IX,X: uvula rises symmetrically XI: bilateral shoulder shrug XII: midline tongue extension without atrophy or fasciculations Motor: Moving right side > left side. Sensory: Pinprick and light touch intact throughout, bilaterally Deep Tendon Reflexes:  Depressed throughout Plantars: Mute bilaterally  Lab Results: Lab Results  Component Value Date/Time   CHOL 171 07/13/2015 03:44 AM   Lipid Panel  Recent Labs  07/13/15 0344  CHOL 171  TRIG 54  HDL 67  CHOLHDL 2.6  VLDL 11  LDLCALC 93    Studies/Results: Ct Head Wo Contrast  07/14/2015   CLINICAL DATA:  Acute onset confusion and lethargy after dialysis. Chronic renal failure.  EXAM: CT HEAD WITHOUT CONTRAST  TECHNIQUE: Contiguous axial images were obtained from the base of the skull through the vertex without intravenous contrast.  COMPARISON:  None.  FINDINGS: Moderate diffuse atrophy is present. There is no intracranial mass, hemorrhage, extra-axial fluid collection, midline shift. There is small vessel disease throughout the centra semiovale bilaterally. There are age uncertain fairly small infarcts in the anterior left occipital and anterior superior left frontal lobes. There is a small age uncertain infarct in the superior posterior right cerebellum. There is also a  small age uncertain infarct in the posterior right temporal lobe lateral and slightly posterior to the temporal horn the right lateral ventricle. There is a small age uncertain infarct in the right lentiform nucleus. Bony calvarium appears intact. The mastoid air cells are clear. There is mild mucosal thickening of several ethmoid air cells bilaterally.  IMPRESSION: Atrophy with extensive periventricular small vessel disease. Age uncertain small infarcts at several sites as described. No hemorrhage or mass effect. Mild ethmoid sinus disease bilaterally.   Electronically Signed   By: Bretta Bang III M.D.   On: 07/14/2015 14:25    MEDICATIONS                                                                                                                        Scheduled: . antiseptic oral rinse  7 mL Mouth Rinse BID  . aspirin EC  325 mg Oral Daily  . calcitRIOL  0.25 mcg Oral Q M,W,F-HD  . darbepoetin (ARANESP) injection - DIALYSIS  60 mcg Intravenous Once  . divalproex  500 mg Oral Q12H  . heparin  5,000 Units Subcutaneous 3 times per day  . insulin aspart  0-9 Units Subcutaneous TID WC  . insulin glargine  3 Units Subcutaneous Daily  . pravastatin  40 mg Oral q1800  . regadenoson  0.4 mg Intravenous Once  . sevelamer  carbonate  800 mg Oral TID WC  . sodium chloride  3 mL Intravenous Q12H    ASSESSMENT/PLAN:                                                                                                           59 y/o with multiple medical problems including MS, admitted with new onset seizure. EEG revealed focal epileptiform discharges left frontal region and patient started on Depakote. Main issue at this time is noted weakness left arm-leg. Ordered MRI brain to ruled out right subcortical infarct. Will follow up.  Wyatt Portela, MD [facility] [phone]  07/15/2015, 9:10 AM

## 2015-07-15 NOTE — Progress Notes (Signed)
OT Cancellation Note  Patient Details Name: Gail Chapman MRN: [id number] DOB: 07/03/1956   Cancelled Treatment:    Reason Eval/Treat Not Completed: Medical issues which prohibited therapy - will check back for medical readiness.   Angelene Giovanni [place], OTR/L [phone]  07/15/2015, 12:37 PM

## 2015-07-16 ENCOUNTER — Inpatient Hospital Stay (HOSPITAL_COMMUNITY): Payer: Medicare Other

## 2015-07-16 DIAGNOSIS — I63443 Cerebral infarction due to embolism of bilateral cerebellar arteries: Secondary | ICD-10-CM

## 2015-07-16 DIAGNOSIS — R401 Stupor: Secondary | ICD-10-CM

## 2015-07-16 DIAGNOSIS — I639 Cerebral infarction, unspecified: Secondary | ICD-10-CM

## 2015-07-16 DIAGNOSIS — I63139 Cerebral infarction due to embolism of unspecified carotid artery: Secondary | ICD-10-CM

## 2015-07-16 DIAGNOSIS — I6340 Cerebral infarction due to embolism of unspecified cerebral artery: Secondary | ICD-10-CM | POA: Insufficient documentation

## 2015-07-16 DIAGNOSIS — I6330 Cerebral infarction due to thrombosis of unspecified cerebral artery: Secondary | ICD-10-CM | POA: Insufficient documentation

## 2015-07-16 LAB — GLUCOSE, CAPILLARY
GLUCOSE-CAPILLARY: 136 mg/dL — AB (ref 65–99)
GLUCOSE-CAPILLARY: 87 mg/dL (ref 65–99)

## 2015-07-16 LAB — BASIC METABOLIC PANEL
ANION GAP: 13 (ref 5–15)
BUN: 43 mg/dL — ABNORMAL HIGH (ref 6–20)
CHLORIDE: 98 mmol/L — AB (ref 101–111)
CO2: 23 mmol/L (ref 22–32)
Calcium: 8.2 mg/dL — ABNORMAL LOW (ref 8.9–10.3)
Creatinine, Ser: 6.01 mg/dL — ABNORMAL HIGH (ref 0.44–1.00)
GFR calc non Af Amer: 7 mL/min — ABNORMAL LOW (ref >60)
GFR, EST AFRICAN AMERICAN: 8 mL/min — AB (ref >60)
GLUCOSE: 77 mg/dL (ref 65–99)
POTASSIUM: 5 mmol/L (ref 3.5–5.1)
Sodium: 134 mmol/L — ABNORMAL LOW (ref 135–145)

## 2015-07-16 LAB — CBC
HEMATOCRIT: 32.2 % — AB (ref 36.0–46.0)
HEMOGLOBIN: 10.1 g/dL — AB (ref 12.0–15.0)
MCH: 28.5 pg (ref 26.0–34.0)
MCHC: 31.4 g/dL (ref 30.0–36.0)
MCV: 90.7 fL (ref 78.0–100.0)
Platelets: 147 10*3/uL — ABNORMAL LOW (ref 150–400)
RBC: 3.55 MIL/uL — AB (ref 3.87–5.11)
RDW: 15.9 % — ABNORMAL HIGH (ref 11.5–15.5)
WBC: 10.2 10*3/uL (ref 4.0–10.5)

## 2015-07-16 MED ORDER — LIDOCAINE HCL (PF) 1 % IJ SOLN: 5.0000 mL | INTRAMUSCULAR | Status: DC | PRN

## 2015-07-16 MED ORDER — HEPARIN SODIUM (PORCINE) 1000 UNIT/ML DIALYSIS: 1000.0000 [IU] | INTRAMUSCULAR | Status: DC | PRN

## 2015-07-16 MED ORDER — SODIUM CHLORIDE 0.9 % IV SOLN: 100.0000 mL | INTRAVENOUS | Status: DC | PRN

## 2015-07-16 MED ORDER — PENTAFLUOROPROP-TETRAFLUOROETH EX AERO: 1.0000 "application " | INHALATION_SPRAY | CUTANEOUS | Status: DC | PRN

## 2015-07-16 MED ORDER — LIDOCAINE-PRILOCAINE 2.5-2.5 % EX CREA: 1.0000 "application " | TOPICAL_CREAM | CUTANEOUS | Status: DC | PRN

## 2015-07-16 MED ORDER — ALTEPLASE 2 MG IJ SOLR
2.0000 mg | Freq: Once | INTRAMUSCULAR | Status: DC | PRN
  Filled 2015-07-16: qty 2

## 2015-07-16 NOTE — Progress Notes (Signed)
Subjective:   Alert but responsive.  MRI showing new bilat acute CVA's occipital  Objective Filed Vitals:   07/16/15 0830 07/16/15 0900 07/16/15 0930 07/16/15 1000  BP: 146/62 145/59 151/78 162/64  Pulse: 70 70 72 79  Temp:      TempSrc:      Resp: Height:      Weight:      SpO2: 97% 99% 98% 100%   Physical Exam General: alert, slow responses Heart: RRR Lungs: CTA, unlabored Abdomen: soft, nontender +BS  Extremities: no edema Dialysis Access:  Cath patent on HD  Dialysis Orders: MWF [facility] 4 hrs   70.5kgs    2K/2.5CA   L IJ Cath (does not have any perm access)  Heparin none Micera 75q 2 weeks Calcitriol 0.25    Assessment/Plan: 1. Seizures (new) / bilat acute CVA's / AMS - on depakote, EEG +. New acute scattered CVA's by MRI.  AMS may be due to concurrent hypertensive encephalopathy vs peri-ictal.  2. Elevated troponin- cardiology following. ECHO yesterday. EF 50-55% 3. ESRD - MWF [place]. HD today, no fluid off. Below dry wt.  4. Hypertension/volume - BP's normal. Meds on hold. Avoid minoxidil for now if restarting BP medications. Severe LVH and chronic hemorrhagic changes on MRI related to uncontrolled longstanding HTN.  5. Anemia - hgb 9.4- on micera outpt- start Aranesp here 6. Metabolic bone disease - Cont renvela. And calcitriol per outpt orders 7. Nutrition - NPO  alb 3.2 8. DM- per primary   Vinson Moselle MD pager [phone]    cell [phone] 07/16/2015, 10:14 AM     Additional Objective Labs: Basic Metabolic Panel:  Recent Labs Lab 07/14/15 0327 07/15/15 0337 07/16/15 0241  NA 138 137 134*  K 3.8 4.2 5.0  CL 100* 101 98*  CO2 GLUCOSE 108* 97 77  BUN 59* 31* 43*  CREATININE 7.00* 4.55* 6.01*  CALCIUM 7.9* 8.4* 8.2*   Liver Function Tests:  Recent Labs Lab 07/13/15 0344  AST 13*  ALT 10*  ALKPHOS 47  BILITOT 0.6  PROT 6.8  ALBUMIN 3.2*   No results for input(s): LIPASE, AMYLASE in the last 168  hours. CBC:  Recent Labs Lab 07/13/15 0344 07/14/15 0327 07/15/15 0337 07/16/15 0241  WBC 10.7* 8.3 10.6* 10.2  HGB 9.5* 9.4* 10.5* 10.1*  HCT 30.2* 29.8* 33.2* 32.2*  MCV 90.4 90.6 90.7 90.7  PLT 156 143* 151 147*   Blood Culture    Component Value Date/Time   SDES PERICARDIAL FLUID 02/26/2013 1557   SPECREQUEST NONE 02/26/2013 1557   CULT NO GROWTH 3 DAYS 02/26/2013 1557   REPTSTATUS 03/02/2013 FINAL 02/26/2013 1557    Cardiac Enzymes:  Recent Labs Lab 07/13/15 1128 07/13/15 1610 07/15/15 0834 07/15/15 1638 07/15/15 1942  TROPONINI 0.06* 0.05* 0.06* 0.06* 0.05*   CBG:  Recent Labs Lab 07/14/15 1237 07/14/15 1745 07/15/15 0747 07/15/15 1726 07/16/15 0008  GLUCAP 106* 120* 92 104* 87   Iron Studies: No results for input(s): IRON, TIBC, TRANSFERRIN, FERRITIN in the last 72 hours. @ Studies/Results: Ct Head Wo Contrast  07/14/2015   CLINICAL DATA:  Acute onset confusion and lethargy after dialysis. Chronic renal failure.  EXAM: CT HEAD WITHOUT CONTRAST  TECHNIQUE: Contiguous axial images were obtained from the base of the skull through the vertex without intravenous contrast.  COMPARISON:  None.  FINDINGS: Moderate diffuse atrophy is present. There is no intracranial mass, hemorrhage, extra-axial fluid collection, midline shift.  There is small vessel disease throughout the centra semiovale bilaterally. There are age uncertain fairly small infarcts in the anterior left occipital and anterior superior left frontal lobes. There is a small age uncertain infarct in the superior posterior right cerebellum. There is also a small age uncertain infarct in the posterior right temporal lobe lateral and slightly posterior to the temporal horn the right lateral ventricle. There is a small age uncertain infarct in the right lentiform nucleus. Bony calvarium appears intact. The mastoid air cells are clear. There is mild mucosal thickening of several ethmoid air cells  bilaterally.  IMPRESSION: Atrophy with extensive periventricular small vessel disease. Age uncertain small infarcts at several sites as described. No hemorrhage or mass effect. Mild ethmoid sinus disease bilaterally.   Electronically Signed   By: Bretta Bang III M.D.   On: 07/14/2015 14:25   Mr Brain Wo Contrast  07/15/2015   CLINICAL DATA:  Stroke with cerebral ischemia  EXAM: MRI HEAD WITHOUT CONTRAST  TECHNIQUE: Multiplanar, multiecho pulse sequences of the brain and surrounding structures were obtained without intravenous contrast.  COMPARISON:  CT head 07/14/2015  FINDINGS: Multiple areas of restricted diffusion bilaterally compatible with acute infarction. Acute infarct in the occipital lobes bilaterally left greater than right. The left-sided stroke measures up to 11 mm. Small area of acute infarct in the right frontal operculum and in the left frontal white matter.  Moderate to advanced chronic microvascular ischemic changes in the white matter bilaterally. Chronic infarcts in the thalamus bilaterally.  Chronic blood products in the left temporal lobe and in the pontomedullary junction compatible with prior hemorrhage. This is likely related to poorly controlled hypertension.  Moderate atrophy. Ventricular enlargement consistent with atrophy. No hydrocephalus. Negative for mass lesion.  Normal orbit.  Paranasal sinuses clear.  Image quality degraded by motion  IMPRESSION: Atrophy and moderate to advanced chronic ischemia. Chronic hemorrhage in the brainstem and left temporal lobe due to chronic hypertension  Acute infarcts in the occipital lobe and frontal lobes bilaterally.   Electronically Signed   By: Marlan Palau M.D.   On: 07/15/2015 14:45   Dg Chest Port 1 View  07/15/2015   CLINICAL DATA:  E86.9 (ICD-10-CM) - Volume depletion  EXAM: PORTABLE CHEST 1 VIEW  COMPARISON:  08/28/2014  FINDINGS: Left-sided dialysis catheter tip to the upper right atrium. Cardiopericardial silhouette is  enlarged and stable. Lungs are clear. No pulmonary edema.  IMPRESSION: No significant change in marked enlargement of the cardiopericardial silhouette.   Electronically Signed   By: Norva Pavlov M.D.   On: 07/15/2015 10:20   Medications: . niCARDipine Stopped (07/14/15 1800)   . antiseptic oral rinse  7 mL Mouth Rinse BID  . aspirin EC  325 mg Oral Daily  . calcitRIOL  0.25 mcg Oral Q M,W,F-HD  . darbepoetin (ARANESP) injection - DIALYSIS  60 mcg Intravenous Once  . heparin  5,000 Units Subcutaneous 3 times per day  . insulin aspart  0-9 Units Subcutaneous TID WC  . insulin glargine  3 Units Subcutaneous Daily  . pantoprazole (PROTONIX) IV  40 mg Intravenous Q24H  . pravastatin  40 mg Oral q1800  . regadenoson  0.4 mg Intravenous Once  . sevelamer carbonate  800 mg Oral TID WC  . sodium chloride  3 mL Intravenous Q12H  . valproate sodium  500 mg Intravenous Q12H

## 2015-07-16 NOTE — Progress Notes (Signed)
Patient Name: Gail Chapman Date of Encounter: 07/16/2015  Principal Problem:   Acute encephalopathy Active Problems:   OSA (obstructive sleep apnea)   Hypertensive emergency   Pulmonary hypertension (HCC)   Chronic diastolic CHF (congestive heart failure) (HCC)   COPD (chronic obstructive pulmonary disease) (HCC)   Seizures (HCC)   Elevated troponin   MS (multiple sclerosis) (HCC)   ESRD on dialysis (HCC)   Acute on chronic diastolic heart failure (HCC)   Cerebral embolism with cerebral infarction   Cerebral thrombosis with cerebral infarction   Length of Stay: 4  SUBJECTIVE  Confusion has resolved. Denies angina and dyspnea.  CURRENT MEDS . antiseptic oral rinse  7 mL Mouth Rinse BID  . aspirin EC  325 mg Oral Daily  . calcitRIOL  0.25 mcg Oral Q M,W,F-HD  . darbepoetin (ARANESP) injection - DIALYSIS  60 mcg Intravenous Once  . heparin  5,000 Units Subcutaneous 3 times per day  . insulin aspart  0-9 Units Subcutaneous TID WC  . insulin glargine  3 Units Subcutaneous Daily  . pantoprazole (PROTONIX) IV  40 mg Intravenous Q24H  . pravastatin  40 mg Oral q1800  . regadenoson  0.4 mg Intravenous Once  . sevelamer carbonate  800 mg Oral TID WC  . sodium chloride  3 mL Intravenous Q12H  . valproate sodium  500 mg Intravenous Q12H    OBJECTIVE   Intake/Output Summary (Last 24 hours) at 07/16/15 1636 Last data filed at 07/16/15 1205  Gross per 24 hour  Intake    425 ml  Output      0 ml  Net    425 ml   Filed Weights   07/16/15 0355 07/16/15 0725 07/16/15 1150  Weight: 142 lb 13.7 oz (64.8 kg) 142 lb 3.2 oz (64.5 kg) 142 lb 3.2 oz (64.5 kg)    PHYSICAL EXAM Filed Vitals:   07/16/15 1530 07/16/15 1546 07/16/15 1600 07/16/15 1630  BP: 162/66  178/74 181/70  Pulse: 76  77 78  Temp:  99.4 F (37.4 C)    TempSrc:  Oral    Resp: Height:      Weight:      SpO2: 100%  100% 100%   General: Alert, oriented x3, no distress Head: no evidence of  trauma, PERRL, EOMI, no exophtalmos or lid lag, no myxedema, no xanthelasma; normal ears, nose and oropharynx Neck: normal jugular venous pulsations and no hepatojugular reflux; brisk carotid pulses without delay and no carotid bruits Chest: clear to auscultation, no signs of consolidation by percussion or palpation, normal fremitus, symmetrical and full respiratory excursions Cardiovascular: normal position and quality of the apical impulse, regular rhythm, normal first and second heart sounds, no rubs or gallops, no murmur Abdomen: no tenderness or distention, no masses by palpation, no abnormal pulsatility or arterial bruits, normal bowel sounds, no hepatosplenomegaly Extremities: no clubbing, cyanosis or edema; 2+ radial, ulnar and brachial pulses bilaterally; 2+ right femoral, posterior tibial and dorsalis pedis pulses; 2+ left femoral, posterior tibial and dorsalis pedis pulses; no subclavian or femoral bruits Neurological: left arm weakness  LABS  CBC  Recent Labs  07/15/15 0337 07/16/15 0241  WBC 10.6* 10.2  HGB 10.5* 10.1*  HCT 33.2* 32.2*  MCV 90.7 90.7  PLT 151 147*   Basic Metabolic Panel  Recent Labs  07/15/15 0337 07/16/15 0241  NA 137 134*  K 4.2 5.0  CL 101 98*  CO2 25 23  GLUCOSE 97 77  BUN 31* 43*  CREATININE 4.55* 6.01*  CALCIUM 8.4* 8.2*   Liver Function Tests No results for input(s): AST, ALT, ALKPHOS, BILITOT, PROT, ALBUMIN in the last 72 hours. No results for input(s): LIPASE, AMYLASE in the last 72 hours. Cardiac Enzymes  Recent Labs  07/15/15 0834 07/15/15 1638 07/15/15 1942  TROPONINI 0.06* 0.06* 0.05*    Radiology Studies Imaging results have been reviewed and Mr Brain Wo Contrast  07/15/2015   CLINICAL DATA:  Stroke with cerebral ischemia  EXAM: MRI HEAD WITHOUT CONTRAST  TECHNIQUE: Multiplanar, multiecho pulse sequences of the brain and surrounding structures were obtained without intravenous contrast.  COMPARISON:  CT head 07/14/2015   FINDINGS: Multiple areas of restricted diffusion bilaterally compatible with acute infarction. Acute infarct in the occipital lobes bilaterally left greater than right. The left-sided stroke measures up to 11 mm. Small area of acute infarct in the right frontal operculum and in the left frontal white matter.  Moderate to advanced chronic microvascular ischemic changes in the white matter bilaterally. Chronic infarcts in the thalamus bilaterally.  Chronic blood products in the left temporal lobe and in the pontomedullary junction compatible with prior hemorrhage. This is likely related to poorly controlled hypertension.  Moderate atrophy. Ventricular enlargement consistent with atrophy. No hydrocephalus. Negative for mass lesion.  Normal orbit.  Paranasal sinuses clear.  Image quality degraded by motion  IMPRESSION: Atrophy and moderate to advanced chronic ischemia. Chronic hemorrhage in the brainstem and left temporal lobe due to chronic hypertension  Acute infarcts in the occipital lobe and frontal lobes bilaterally.   Electronically Signed   By: Marlan Palau M.D.   On: 07/15/2015 14:45   Dg Chest Port 1 View  07/15/2015   CLINICAL DATA:  E86.9 (ICD-10-CM) - Volume depletion  EXAM: PORTABLE CHEST 1 VIEW  COMPARISON:  08/28/2014  FINDINGS: Left-sided dialysis catheter tip to the upper right atrium. Cardiopericardial silhouette is enlarged and stable. Lungs are clear. No pulmonary edema.  IMPRESSION: No significant change in marked enlargement of the cardiopericardial silhouette.   Electronically Signed   By: Norva Pavlov M.D.   On: 07/15/2015 10:20    TELE No atrial fibrillation   ASSESSMENT AND PLAN  Recent ischemic strokes with pattern suggestive of embolic event, rather than watershed infarcts related to hypotension. Initial seizure at presentation may have been secondary to acute CVA. MRI also shows previous (old) thalamic infarcts and both temporal and midbrain hemorrhage (also chronic). BP  control is appropriate for recent stroke Suspect multiple iterations of her BP meds will be necessary until we find the appropriate combination. For now, avoid excessive BP reduction. Even though there has been no atrial fibrillation detected since admission, statistically this is the likely cause for her embolic strokes. Recommend implantable loop recorder before DC, unless a clear diagnosis is established by then.  TEE is also appropriate, but will not be ready for this until next week.  Thurmon Fair, MD, [facility] [phone] office [phone] pager 07/16/2015 4:36 PM '

## 2015-07-16 NOTE — Progress Notes (Signed)
STROKE TEAM PROGRESS NOTE   HISTORY Marque Bango Berling is an 59 y.o. female history of multiple sclerosis, diabetes mellitus, coronary hypertension, congestive heart failure, hypertension, COPD and end-stage renal disease on dialysis, transferred from [facility] for evaluation after brought to the ED there following a witnessed generalized seizure. Patient has no history of seizure disorder or previous seizure activity. 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 Patient was initially confused, but status has returned to baseline. She was hypertensive and required acute intervention, including Cardene drip. Patient is not ambulatory and lives at home with her spouse who is her primary caretaker. She requires help with ADLs. Her last MS flare was in 2010. She has experienced slowly progressive weakness of extremities, however, in the interim.  Neurohospitalists have followed since admission. On Depakote for seizure. MRI showed acute infarcts in the occipital and frontal lobes. Patient was not administered TPA secondary to incidental finding; unknown time of onset. She was transferred to the stroke service for further evaluation and treatment.   SUBJECTIVE (INTERVAL HISTORY) Her dialysis RN is at the bedside.  No family present. Overall she feels her condition is stable.    OBJECTIVE Temp:  [97.7 F (36.5 C)-98.9 F (37.2 C)] 98.6 F (37 C) (10/07 0802) Pulse Rate:  [64-73] 70 (10/07 0800) Cardiac Rhythm:  [-] Normal sinus rhythm (10/07 0800) Resp:  [15-23] 21 (10/07 0800) BP: (66-156)/(30-76) 139/57 mmHg (10/07 0800) SpO2:  [97 %-100 %] 98 % (10/07 0800) Weight:  [64.8 kg (142 lb 13.7 oz)] 64.8 kg (142 lb 13.7 oz) (10/07 0355)  CBC:   Recent Labs Lab 07/15/15 0337 07/16/15 0241  WBC 10.6* 10.2  HGB 10.5* 10.1*  HCT 33.2* 32.2*  MCV 90.7 90.7  PLT  151 147*    Basic Metabolic Panel:   Recent Labs Lab 07/15/15 0337 07/16/15 0241  NA 137 134*  K 4.2 5.0  CL 101 98*  CO2 25 23  GLUCOSE 97 77  BUN 31* 43*  CREATININE 4.55* 6.01*  CALCIUM 8.4* 8.2*    Lipid Panel:     Component Value Date/Time   CHOL 171 07/13/2015 0344   TRIG 54 07/13/2015 0344   HDL 67 07/13/2015 0344   CHOLHDL 2.6 07/13/2015 0344   VLDL 11 07/13/2015 0344   LDLCALC 93 07/13/2015 0344   HgbA1c:  Lab Results  Component Value Date   HGBA1C 5.9* 07/13/2015   Urine Drug Screen: No results found for: LABOPIA, COCAINSCRNUR, LABBENZ, AMPHETMU, THCU, LABBARB    IMAGING  Ct Head Wo Contrast 07/14/2015    Atrophy with extensive periventricular small vessel disease. Age uncertain small infarcts at several sites as described. No hemorrhage or mass effect. Mild ethmoid sinus disease bilaterally.     Mr Brain Wo Contrast 07/15/2015    Atrophy and moderate to advanced chronic ischemia. Chronic hemorrhage in the brainstem and left temporal lobe due to chronic hypertension  Acute infarcts in the occipital lobe and frontal lobes bilaterally.     2D Echocardiogram   - Left ventricle: The cavity size was normal. There was severeconcentric hypertrophy. Systolic function was normal. Theestimated ejection fraction was in the range of 50% to 55%. Thereis akinesis of the basal-midinferior myocardium. Dopplerparameters are consistent with a reversible restrictive pattern,indicative of decreased left ventricular diastolic complianceand/or increased left atrial pressure (grade 3 diastolicdysfunction). - Mitral valve: Severely calcified annulus. There was  moderateregurgitation. - Left atrium: The atrium was moderately dilated. - Right ventricle: The cavity size was mildly dilated. Wallthickness was normal. - Pulmonary arteries: Systolic pressure was mildly increased. PApeak pressure: 43 mm Hg (S). - Pericardium, extracardiac: A small to moderate pericardialeffusion  was identified posterior to the heart (1.5cm). Featureswere not consistent with tamponade physiology.   PHYSICAL EXAM Middle-aged obese African-American lady not in distress. . Afebrile. Head is nontraumatic. Neck is supple without bruit.    Cardiac exam no murmur or gallop. Lungs are clear to auscultation. Distal pulses are well felt. Dialysis catheter in the neck Neurological Exam :  Cerebellar: 1 only. Diminished attention, registration and recall. No aphasia or apraxia dysarthria. Eye movements are full range without nystagmus. Blinks to threat bilaterally. Fundi were not visualized. Vision acuity and fields seem adequate. Face is symmetric without weakness. Tongue is midline. Motor system exam reveals no upper or lower extremity drift. Weakness of left grip, hand,  hip and ankle dorsiflexors.. Sensation seems preserved bilaterally. Coordination is slow but accurate. Gait was not tested. Deep tendon reflexes are symmetric. Plantars are downgoing.   ASSESSMENT/PLAN Ms. SYLVANA BONK is a 59 y.o. female with history of MS, chronic bilateral leg weakness (using wheelchair), hypertension, hyperlipidemia, diabetes mellitus, diastolic congestive heart failure, pulmonary hypertension, OSA on CPAP, COPD on 3 L oxygen at home, and end-stage renal disease-HD (MWF), who was transferred from [place] with altered mental status, dizziness, seizure. MRI found stroke during admission.   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 May consider TCD with bubble if not felt to be a TEE candidate.   Heparin 5000 units sq tid for VTE prophylaxis Diet clear  liquid Room service appropriate?: Yes; Fluid consistency:: Thin  aspirin 325 mg orally every day prior to admission, now on aspirin 325 mg orally every day  Ongoing aggressive stroke risk factor management  Therapy recommendations:  pending  Disposition:  pending   Seizure  On depakote  EEG positive 10/4 & 10/5  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  BP as high as 232/101, as low as 66/30  Stable currently  Hyperlipidemia  Home meds:  No statin   LDL 93, goal < 70  New pravachol 40 added this admission  Continue statin at discharge  Diabetes type II  HgbA1c 5.9, at goal < 7.0  Controlled  Other Stroke Risk Factors  Former Cigarette smoker  Coronary artery disease  Elevated troponin - card consult - possible inferior wall akinesis area by echo that should be evaluated (not urgent).  Obstructive sleep apnea, on CPAP at home, 3L O2  Other Active Problems  ESRD on HD MWF  Anemia of chronic disease  Metabolic bone disease  COPD  Chronic diastolic heart failure  Multiple sclerosis w/ chronic B leg weakness, uses w/c  Pulmonary hypertension   Hospital day # 4  Rhoderick [facility] Stroke Center See Amion for Pager information 07/16/2015 8:19 AM  I have personally examined this patient, reviewed notes, independently viewed imaging studies, participated in medical decision making and plan of care. I have made any additions or clarifications directly to  the above note. Agree with note above. She presented with seizures and initial MRI did not show strokes but subsequent 1 that showed by cerebral embolic infarcts in etiology to be determined. She remains at risk for seizures, strokes, TIAs, neurological worsening and needs ongoing stroke evaluation and aggressive risk factor control. Recommend check TEE and lower extremity venous Dopplers. No family at bedside.  Delia Heady, MD Medical Director [facility] Stroke Center Pager:  [phone] 07/16/2015 2:10 PM    To contact Stroke Continuity provider, please refer to [email or web address]. After hours, contact General Neurology

## 2015-07-16 NOTE — Progress Notes (Signed)
PT Cancellation Note  Patient Details Name: TERRIS GIDDENS MRN: [id number] DOB: 1956-03-02   Cancelled Treatment:    Reason Eval/Treat Not Completed: Patient at procedure or test/unavailable. Pt currently on HD in room. Will check back as schedule allows to complete PT eval.    Conni Slipper 07/16/2015, 10:37 AM  Conni Slipper, PT, DPT Acute Rehabilitation Services Pager: [phone]

## 2015-07-16 NOTE — Progress Notes (Signed)
PULMONARY / CRITICAL CARE MEDICINE   Name: Gail Chapman MRN: [id number] DOB: [birth year]    ADMISSION DATE:  07/12/2015 CONSULTATION DATE:  07/14/2015  REFERRING MD :  Isidoro Donning ([facility])  CHIEF COMPLAINT:  HTN crisis  INITIAL PRESENTATION: 59yo female with hx DM, uncontrolled HTN, CHF, COPD, OSA, ESRD (MWF) initially admitted with confusion and probable seizure. She was seen by neuro and started on depakote. On 10/5 after HD (with 6L removed) she had episode of acute AMS (would follow some commands but very slow to respond, took multiple cues to follow one simple command) and acutely increased BP with SBP 200. She was tx to ICU for cardene drip and PCCM consulted.   STUDIES:  EEG 10/4>>> focal sharp wave activity in L frontal region suggestive of focal disturbance  MRI brain 10/5>>> neg acute. Progressive extensive white matter disease eeg 10/5>>>focal sharp wave activity in the frontal regions bilaterally (left more than right) MRI Brain 10/6 >>> Acute infarcts in occiptal lobe and frontal lobes bilaterally. Chronic hemorrhage in brainstem and left temporal lobe due to chronic HTN  SIGNIFICANT EVENTS: 07/12/2015 >>> Admit for encephalopathy and probable seizure 07/14/2015 >>> Transferred to ICU for cardene drip for hypertensive urgency  SUBJECTIVE:  Awake, follows some commands  VITAL SIGNS: Temp:  [97.7 F (36.5 C)-99 F (37.2 C)] 97.7 F (36.5 C) (10/07 0341) Pulse Rate:  [64-73] 72 (10/07 0613) Resp:  [15-23] 22 (10/07 0613) BP: (66-156)/(30-70) 135/64 mmHg (10/07 0613) SpO2:  [99 %-100 %] 100 % (10/07 0613) Weight:  [142 lb 13.7 oz (64.8 kg)] 142 lb 13.7 oz (64.8 kg) (10/07 0355) HEMODYNAMICS:   VENTILATOR SETTINGS:   INTAKE / OUTPUT:  Intake/Output Summary (Last 24 hours) at 07/16/15 0736 Last data filed at 07/16/15 0600  Gross per 24 hour  Intake    730 ml  Output      0 ml  Net    730 ml    PHYSICAL EXAMINATION: General:  Chronically ill appearing female,  NAD Neuro:  Awake, follows some commands, mumbles some words, LUE weak compared to RUE HEENT:  PERRL Cardiovascular:  RRR Lungs:  CTAB Abdomen:  +BS, S, NT, ND Musculoskeletal:  Trace BLE edema Skin:  Warm, dry  LABS:  CBC  Recent Labs Lab 07/14/15 0327 07/15/15 0337 07/16/15 0241  WBC 8.3 10.6* 10.2  HGB 9.4* 10.5* 10.1*  HCT 29.8* 33.2* 32.2*  PLT 143* 151 147*   Coag's No results for input(s): APTT, INR in the last 168 hours. BMET  Recent Labs Lab 07/14/15 0327 07/15/15 0337 07/16/15 0241  NA 138 137 134*  K 3.8 4.2 5.0  CL 100* 101 98*  CO2 BUN 59* 31* 43*  CREATININE 7.00* 4.55* 6.01*  GLUCOSE 108* 97 77   Electrolytes  Recent Labs Lab 07/14/15 0327 07/15/15 0337 07/16/15 0241  CALCIUM 7.9* 8.4* 8.2*   Sepsis Markers No results for input(s): LATICACIDVEN, PROCALCITON, O2SATVEN in the last 168 hours. ABG No results for input(s): PHART, PCO2ART, PO2ART in the last 168 hours. Liver Enzymes  Recent Labs Lab 07/13/15 0344  AST 13*  ALT 10*  ALKPHOS 47  BILITOT 0.6  ALBUMIN 3.2*   Cardiac Enzymes  Recent Labs Lab 07/15/15 0834 07/15/15 1638 07/15/15 1942  TROPONINI 0.06* 0.06* 0.05*   Glucose  Recent Labs Lab 07/13/15 2115 07/14/15 1237 07/14/15 1745 07/15/15 0747 07/15/15 1726 07/16/15 0008  GLUCAP 121* 106* 120* 92 104* 87    Imaging Mr Brain Wo Contrast  07/15/2015   CLINICAL DATA:  Stroke with cerebral ischemia  EXAM: MRI HEAD WITHOUT CONTRAST  TECHNIQUE: Multiplanar, multiecho pulse sequences of the brain and surrounding structures were obtained without intravenous contrast.  COMPARISON:  CT head 07/14/2015  FINDINGS: Multiple areas of restricted diffusion bilaterally compatible with acute infarction. Acute infarct in the occipital lobes bilaterally left greater than right. The left-sided stroke measures up to 11 mm. Small area of acute infarct in the right frontal operculum and in the left frontal white matter.   Moderate to advanced chronic microvascular ischemic changes in the white matter bilaterally. Chronic infarcts in the thalamus bilaterally.  Chronic blood products in the left temporal lobe and in the pontomedullary junction compatible with prior hemorrhage. This is likely related to poorly controlled hypertension.  Moderate atrophy. Ventricular enlargement consistent with atrophy. No hydrocephalus. Negative for mass lesion.  Normal orbit.  Paranasal sinuses clear.  Image quality degraded by motion  IMPRESSION: Atrophy and moderate to advanced chronic ischemia. Chronic hemorrhage in the brainstem and left temporal lobe due to chronic hypertension  Acute infarcts in the occipital lobe and frontal lobes bilaterally.   Electronically Signed   By: Marlan Palau M.D.   On: 07/15/2015 14:45   Dg Chest Port 1 View  07/15/2015   CLINICAL DATA:  E86.9 (ICD-10-CM) - Volume depletion  EXAM: PORTABLE CHEST 1 VIEW  COMPARISON:  08/28/2014  FINDINGS: Left-sided dialysis catheter tip to the upper right atrium. Cardiopericardial silhouette is enlarged and stable. Lungs are clear. No pulmonary edema.  IMPRESSION: No significant change in marked enlargement of the cardiopericardial silhouette.   Electronically Signed   By: Norva Pavlov M.D.   On: 07/15/2015 10:20     ASSESSMENT / PLAN:  PULMONARY A: COPD OSA Protecting airway P:   CPAP qhs  CARDIOVASCULAR A: Hypertensive urgency - resolved, now relatively hypotensive Chronic diastolic CHF Elevated troponin Acute cva P:  Hold oral anti hypertensives Volume management via dialysis - not volume overloaded - even goals MAp goal can be mor liberal now with acute cva noted, would avoid sys 220 etc with prior HTN enceph result from that BP, goal sys 160-180 If HTn add hydral (hr 74) or nicardipine  RENAL A:  ESRD on dialysis P:   HD MWF, on today Daily BMP  GASTROINTESTINAL A:  New cva, r./o dysphagia P:  Npo ppi  HEMATOLOGIC A:  Anemia of  chronic disease, high risk dvt  P:  Trend CBC Sub q heparin   INFECTIOUS A:  No active issues P:  Follow fever curve  ENDOCRINE A:  T2DM   P:   Monitor CBGs, SSI  NEUROLOGIC A:  Acute encephalopathy Acute bila occ, frontal cva ?Delerium Seizure MS Acute bilateral occipital and frontal CVA NEW neurochange 10/6 - r/o MAP too low causing poor autoregulation vs todds paralaysis P:   Neurology carotid, echo, etc sys goal consider 160-180 Continue depakote   FAMILY  - Updates:    Caleb M. Jimmey Ralph, MD [facility] Family Medicine Resident PGY-2 07/16/2015 7:36 AM   STAFF NOTE: Cindi Carbon, MD FACP have personally reviewed patient's available data, including medical history, events of note, physical examination and test results as part of my evaluation. I have discussed with resident/NP and other care providers such as pharmacist, RN and RRT. In addition, I personally evaluated patient and elicited key findings of: alert this am weakness remains, MRi reviewed, acute cva noted, cosnider sys goal 160-180 , some permissive with cva, but avoiding 220 etc  with prior affect from htn, HD on now, re assess BP, carotid, echo needed, work up per neuro, asa, consider transfer sdu pending hd and BP The patient is critically ill with multiple organ systems failure and requires high complexity decision making for assessment and support, frequent evaluation and titration of therapies, application of advanced monitoring technologies and extensive interpretation of multiple databases.   Critical Care Time devoted to patient care services described in this note is30 Minutes. This time reflects time of care of this signee: Rory Percy, MD FACP. This critical care time does not reflect procedure time, or teaching time or supervisory time of PA/NP/Med student/Med Resident etc but could involve care discussion time. Rest per NP/medical resident whose note is outlined above and that I agree  with   Mcarthur Rossetti. Tyson Alias, MD, FACP Pgr: [phone] [facility] 07/16/2015 10:07 AM

## 2015-07-16 NOTE — Progress Notes (Signed)
Pt is now on CPAP 4. Pt tolerates it well and has 2L of oxygen bleeded into device.  No complications noted. RN aware

## 2015-07-16 NOTE — Progress Notes (Signed)
*  PRELIMINARY RESULTS* Vascular Ultrasound Carotid Duplex (Doppler) has been completed.   Findings suggest 1-39% internal carotid artery stenosis bilaterally. Vertebral arteries are patent with antegrade flow.   Bilateral lower extremity venous duplex completed. Bilateral lower extremities are negative for deep vein thrombosis. There is no evidence of Baker's cyst bilaterally.  Transcranial doppler pending.   07/16/2015 4:25 PM Gertie Fey, RVT, RDCS, RDMS

## 2015-07-16 NOTE — Progress Notes (Signed)
Husband at bedside. He states he did not know she had a CVA this admission and that the patient "only had seizures". He became agitated stating "I have not seen a doctor since she has been here" and "ya'll don't know how to take care of her" and "Dr Gala Romney is her doctor and he wants her blood pressure in the 200's" and "her blood pressure is not why she had a stroke". Husband does not want Renvela given and states he does not want her blood sugar treated with Lantus if it is below 120.  I paged nueorolgy to visit husband in the room during his visit. Also informed Dr Tyson Alias with CCM.

## 2015-07-17 ENCOUNTER — Inpatient Hospital Stay (HOSPITAL_COMMUNITY): Payer: Medicare Other

## 2015-07-17 DIAGNOSIS — I63119 Cerebral infarction due to embolism of unspecified vertebral artery: Secondary | ICD-10-CM

## 2015-07-17 DIAGNOSIS — I6330 Cerebral infarction due to thrombosis of unspecified cerebral artery: Secondary | ICD-10-CM

## 2015-07-17 DIAGNOSIS — G9340 Encephalopathy, unspecified: Secondary | ICD-10-CM

## 2015-07-17 DIAGNOSIS — I639 Cerebral infarction, unspecified: Secondary | ICD-10-CM | POA: Insufficient documentation

## 2015-07-17 LAB — CBC
HEMATOCRIT: 29.9 % — AB (ref 36.0–46.0)
HEMOGLOBIN: 9.6 g/dL — AB (ref 12.0–15.0)
MCH: 28.7 pg (ref 26.0–34.0)
MCHC: 32.1 g/dL (ref 30.0–36.0)
MCV: 89.5 fL (ref 78.0–100.0)
Platelets: 157 10*3/uL (ref 150–400)
RBC: 3.34 MIL/uL — ABNORMAL LOW (ref 3.87–5.11)
RDW: 15.7 % — ABNORMAL HIGH (ref 11.5–15.5)
WBC: 9.1 10*3/uL (ref 4.0–10.5)

## 2015-07-17 LAB — BASIC METABOLIC PANEL
ANION GAP: 9 (ref 5–15)
BUN: 21 mg/dL — ABNORMAL HIGH (ref 6–20)
CHLORIDE: 99 mmol/L — AB (ref 101–111)
CO2: 25 mmol/L (ref 22–32)
CREATININE: 3.85 mg/dL — AB (ref 0.44–1.00)
Calcium: 8.5 mg/dL — ABNORMAL LOW (ref 8.9–10.3)
GFR calc non Af Amer: 12 mL/min — ABNORMAL LOW (ref >60)
GFR, EST AFRICAN AMERICAN: 14 mL/min — AB (ref >60)
Glucose, Bld: 87 mg/dL (ref 65–99)
POTASSIUM: 4.3 mmol/L (ref 3.5–5.1)
Sodium: 133 mmol/L — ABNORMAL LOW (ref 135–145)

## 2015-07-17 LAB — GLUCOSE, CAPILLARY
GLUCOSE-CAPILLARY: 86 mg/dL (ref 65–99)
GLUCOSE-CAPILLARY: 84 mg/dL (ref 65–99)
GLUCOSE-CAPILLARY: 86 mg/dL (ref 65–99)
GLUCOSE-CAPILLARY: 84 mg/dL (ref 65–99)
Glucose-Capillary: 78 mg/dL (ref 65–99)

## 2015-07-17 NOTE — Progress Notes (Signed)
[facility] PROGRESS NOTE  Gail Chapman ZOX:[id number] DOB: Feb 08, 1956 DOA: 07/12/2015 PCP: No PCP Per Patient HPI/Subjective: Gail Chapman is a 59 y.o. female with PMH of MS, chronic bilateral leg weakness (using wheelchair), hypertension, hyperlipidemia, diabetes mellitus, diastolic congestive heart failure, pulmonary hypertension, OSA on CPAP, COPD on 3 L oxygen at home, and end-stage renal disease-HD (MWF), who presents with altered mental status, dizziness, seizure.   Pt was transferred from [facility] for evaluation after brought to the ED there following a witnessed seizure.   Per patient's husband, patient became confused after using bathroom at 10:00 AM. She also had dizziness and aphasia. Sheand was noticed to have rgidity in both arms and deviated gaze to the right. The episode lasted for about 2 to 3 min. She was brought to [facility] ED where she was found to have elevated bp at 260/130 and elevated trop at 0.46. She was started with Cardizem drip in the emergency room. Pt did not have chest pain. She has mild cough, no sputum production, no fever or chills. She missed HD today. Patient has no history of seizure disorder or previous seizure activity.  After patient was admitted to the hospital patient developed another episode of acute encephalopathy with hypertensive emergency. Patient was sent to the critical care unit and was kept on nicardepine drip.  #CT scan of head  and MRA of the brain were unremarkable for any acute events initially . However repeat MRI showed occipital and frontal infarcts  In [place] [facility] ED, patient was found to have troponin 0.46, INR 1.2, PTT 60, negative urinalysis, WBC 9.18, temperature normal, no tachycardia, creatinine 5.84, BUN 46. Chest x-ray negative for acute abnormalities.   Assessment/Plan: Altered mental status Secondary to seizures, accelerated hypertension, CVA, underlying history of multiple  sclerosis Patient is currently oriented 3 however slow to respond Current continue treating the underlying conditions  CVA Concerning aport cardioembolic source as patient has infarcts in the frontal and occipital area Echocardiogram did not show any atrial or ventricular thrombus Plan to do TEE Cardiology and neurology is on board Continue with aspirin 325 mg daily Continue the statin Continue on telemetry for looking into possible arrhythmias Plan to discharge the patient on cardiac monitoring if patient is not found to have any evidence of arrhythmias  Seizures Neurology is on board On Depakote  End-stage renal disease On hemodialysis MWF schedule in [facility] Nephrology is on board  Hypertension accelerated Currently well controlled  Diabetes mellitus insulin-dependent  continue the Levemir Follow-up on sliding scale insulin Blood sugars are well controlled  Debility At baseline was able to walk inside the house  Mild elevation of the troponin Thought to be from demand ischemia Evaluated by cardiology Continue with medical management   Code Status: full Family Communication: No family members present at bedside Disposition Plan: SNIF Consultants: Cardiology Neurology Nephrology  Procedures: STUDIES:  EEG 10/4>>> focal sharp wave activity in L frontal region suggestive of focal disturbance  MRI brain 10/5>>> neg acute. Progressive extensive white matter disease eeg 10/5>>>focal sharp wave activity in the frontal regions bilaterally (left more than right) MRI Brain 10/6 >>> Acute infarcts in occiptal lobe and frontal lobes bilaterally. Chronic hemorrhage in brainstem and left temporal lobe due to chronic HTN  SIGNIFICANT EVENTS: 07/12/2015 >>> Admit for encephalopathy and probable seizure 07/14/2015 >>> Transferred to ICU for cardene drip for hypertensive urgency   Objective: Filed Vitals:   07/17/15 1300  BP:   Pulse:   Temp: 99.5  F (37.5 C)   Resp:     Intake/Output Summary (Last 24 hours) at 07/17/15 1444 Last data filed at 07/17/15 1200  Gross per 24 hour  Intake    315 ml  Output      0 ml  Net    315 ml   Filed Weights   07/16/15 0725 07/16/15 1150 07/17/15 0500  Weight: 64.5 kg (142 lb 3.2 oz) 64.5 kg (142 lb 3.2 oz) 65.4 kg (144 lb 2.9 oz)    Exam:  General: Alert, oriented x3, no distress, slow to respond Head: no evidence of trauma, PERRL, EOMI, Neck: normal jugular venous pulsations and no hepatojugular reflux; brisk carotid pulses without delay and no carotid bruits Chest: clear to auscultation, no signs of consolidation by percussion or palpation, normal fremitus, symmetrical and full respiratory excursions Cardiovascular:  regular rhythm, normal first and second heart sounds, no rubs or gallops, no murmur Abdomen: no tenderness or distention, no masses by palpation, no abnormal pulsatility or arterial bruits, normal bowel sounds, no hepatosplenomegaly Extremities: no clubbing, cyanosis or edema; 2+ radial, ulnar and brachial pulses bilaterally; 2+ right femoral, posterior tibial and dorsalis pedis pulses; 2+ left femoral, posterior tibial and dorsalis pedis pulses; no subclavian or femoral bruits Neurological: left arm weakness Data Reviewed: Basic Metabolic Panel:  Recent Labs Lab 07/13/15 0344 07/14/15 0327 07/15/15 0337 07/16/15 0241 07/17/15 0224  NA 141 138 137 134* 133*  K 3.7 3.8 4.2 5.0 4.3  CL 105 100* 101 98* 99*  CO2 GLUCOSE 118* 108* 97 77 87  BUN 54* 59* 31* 43* 21*  CREATININE 6.33* 7.00* 4.55* 6.01* 3.85*  CALCIUM 7.9* 7.9* 8.4* 8.2* 8.5*   Liver Function Tests:  Recent Labs Lab 07/13/15 0344  AST 13*  ALT 10*  ALKPHOS 47  BILITOT 0.6  PROT 6.8  ALBUMIN 3.2*   No results for input(s): LIPASE, AMYLASE in the last 168 hours. No results for input(s): AMMONIA in the last 168 hours. CBC:  Recent Labs Lab 07/13/15 0344 07/14/15 0327 07/15/15 0337  07/16/15 0241 07/17/15 0224  WBC 10.7* 8.3 10.6* 10.2 9.1  HGB 9.5* 9.4* 10.5* 10.1* 9.6*  HCT 30.2* 29.8* 33.2* 32.2* 29.9*  MCV 90.4 90.6 90.7 90.7 89.5  PLT 156 143* 151 147* 157   Cardiac Enzymes:  Recent Labs Lab 07/13/15 1128 07/13/15 1610 07/15/15 0834 07/15/15 1638 07/15/15 1942  TROPONINI 0.06* 0.05* 0.06* 0.06* 0.05*   BNP (last 3 results)  Recent Labs  07/13/15 0344  BNP >4500.0*    ProBNP (last 3 results) No results for input(s): PROBNP in the last 8760 hours.  CBG:  Recent Labs Lab 07/15/15 1726 07/16/15 0008 07/16/15 1313 07/17/15 0815 07/17/15 1259  GLUCAP 104* 87 136* 78 86    Recent Results (from the past 240 hour(s))  MRSA PCR Screening     Status: None   Collection Time: 07/12/15  7:40 PM  Result Value Ref Range Status   MRSA by PCR NEGATIVE NEGATIVE Final    Comment:        The GeneXpert MRSA Assay (FDA approved for NASAL specimens only), is one component of a comprehensive MRSA colonization surveillance program. It is not intended to diagnose MRSA infection nor to guide or monitor treatment for MRSA infections.   MRSA PCR Screening     Status: None   Collection Time: 07/14/15  3:05 PM  Result Value Ref Range Status   MRSA by PCR NEGATIVE NEGATIVE Final  Comment:        The GeneXpert MRSA Assay (FDA approved for NASAL specimens only), is one component of a comprehensive MRSA colonization surveillance program. It is not intended to diagnose MRSA infection nor to guide or monitor treatment for MRSA infections.      Studies: No results found.  Scheduled Meds: . antiseptic oral rinse  7 mL Mouth Rinse BID  . aspirin EC  325 mg Oral Daily  . calcitRIOL  0.25 mcg Oral Q M,W,F-HD  . darbepoetin (ARANESP) injection - DIALYSIS  60 mcg Intravenous Once  . heparin  5,000 Units Subcutaneous 3 times per day  . insulin aspart  0-9 Units Subcutaneous TID WC  . insulin glargine  3 Units Subcutaneous Daily  . pantoprazole  (PROTONIX) IV  40 mg Intravenous Q24H  . pravastatin  40 mg Oral q1800  . regadenoson  0.4 mg Intravenous Once  . sevelamer carbonate  800 mg Oral TID WC  . sodium chloride  3 mL Intravenous Q12H  . valproate sodium  500 mg Intravenous Q12H   Continuous Infusions: . niCARDipine Stopped (07/14/15 1800)   Time spent: 50 min    Gail Chapman  [facility] Pager [phone]. If 7PM-7AM, please contact night-coverage at [email or web address], password [facility] 07/17/2015, 2:44 PM  LOS: 5 days

## 2015-07-17 NOTE — Progress Notes (Signed)
Primary cardiologist:  Subjective:    No complaints  Objective:   Temp:  [97.5 F (36.4 C)-99.8 F (37.7 C)] 99.2 F (37.3 C) (10/08 0814) Pulse Rate:  [66-84] 66 (10/08 0700) Resp:  [16-25] 17 (10/08 0700) BP: (111-181)/(44-75) 144/63 mmHg (10/08 0700) SpO2:  [93 %-100 %] 100 % (10/08 0700) Weight:  [142 lb 3.2 oz (64.5 kg)-144 lb 2.9 oz (65.4 kg)] 144 lb 2.9 oz (65.4 kg) (10/08 0500) Last BM Date: 07/13/15  Filed Weights   07/16/15 0725 07/16/15 1150 07/17/15 0500  Weight: 142 lb 3.2 oz (64.5 kg) 142 lb 3.2 oz (64.5 kg) 144 lb 2.9 oz (65.4 kg)    Intake/Output Summary (Last 24 hours) at 07/17/15 0952 Last data filed at 07/17/15 0600  Gross per 24 hour  Intake    430 ml  Output      0 ml  Net    430 ml    Telemetry: NSR  Exam:  General: NAD  Resp: CTAB  Cardiac: RRR, 2/6 systolic murmur at apex, no JVD  GI: abdomen soft, NT, ND  MSK: no LE edema  Psych: appropriate affect  Skin: no rash  Lab Results:  Basic Metabolic Panel:  Recent Labs Lab 07/15/15 0337 07/16/15 0241 07/17/15 0224  NA 137 134* 133*  K 4.2 5.0 4.3  CL 101 98* 99*  CO2 GLUCOSE 97 77 87  BUN 31* 43* 21*  CREATININE 4.55* 6.01* 3.85*  CALCIUM 8.4* 8.2* 8.5*    Liver Function Tests:  Recent Labs Lab 07/13/15 0344  AST 13*  ALT 10*  ALKPHOS 47  BILITOT 0.6  PROT 6.8  ALBUMIN 3.2*    CBC:  Recent Labs Lab 07/15/15 0337 07/16/15 0241 07/17/15 0224  WBC 10.6* 10.2 9.1  HGB 10.5* 10.1* 9.6*  HCT 33.2* 32.2* 29.9*  MCV 90.7 90.7 89.5  PLT 151 147* 157    Cardiac Enzymes:  Recent Labs Lab 07/15/15 0834 07/15/15 1638 07/15/15 1942  TROPONINI 0.06* 0.06* 0.05*    BNP: No results for input(s): PROBNP in the last 8760 hours.  Coagulation: No results for input(s): INR in the last 168 hours.  ECG:   Medications:   Scheduled Medications: . antiseptic oral rinse  7 mL Mouth Rinse BID  . aspirin EC  325 mg Oral Daily  . calcitRIOL  0.25  mcg Oral Q M,W,F-HD  . darbepoetin (ARANESP) injection - DIALYSIS  60 mcg Intravenous Once  . heparin  5,000 Units Subcutaneous 3 times per day  . insulin aspart  0-9 Units Subcutaneous TID WC  . insulin glargine  3 Units Subcutaneous Daily  . pantoprazole (PROTONIX) IV  40 mg Intravenous Q24H  . pravastatin  40 mg Oral q1800  . regadenoson  0.4 mg Intravenous Once  . sevelamer carbonate  800 mg Oral TID WC  . sodium chloride  3 mL Intravenous Q12H  . valproate sodium  500 mg Intravenous Q12H     Infusions: . niCARDipine Stopped (07/14/15 1800)     PRN Medications:  sodium chloride, sodium chloride, acetaminophen **OR** acetaminophen, alteplase, heparin, lidocaine (PF), lidocaine-prilocaine, LORazepam, metoprolol, ondansetron **OR** ondansetron (ZOFRAN) IV, pentafluoroprop-tetrafluoroeth     Assessment/Plan   1. CVA - concern for possible cardioembolic source - no detected afib to this point, continue telemetry. May need consideration for outpatient monitor if not detected during admission - per Dr Edison Simon note plan for TEE likely next week as she continues to recover from her CVA with related  cognitive and swallowing limitations.  - she is on ASA 325, statin for secondary prevention. No ACE-I given AKI  2. Elevated troponin - flat mild elevation to 0.06. Chronic lateral and inferior ST/T changes, no new ischemic changes - echo LVEF 50-55%, akinesis of the basal-midinferior wall, restrictive diastolic function, moderate MR, PASP 43, moderate pericardial effusion. - do not suspect ACS, probable demand ischemia in setting of CVA, HTN, and seizures - does have WMA on echo, Dr Royann Shivers had ordered lexiscan but has been postponed due to her ongoing medical illness. Will d/c order, can be reordered when she is medically improved, no urgency in study.   3. Pericardial effusion - chronic, small compared to 04/2015 study - no evidence of tamponade physiology  4. Seizures - per  neurology  5. HTN - bp targets per neurology in setting of CVA and accepted permissive HTN.    Will follow peripherally tomorrow with chart and tele review, resume regular rounds Monday.   Dina Rich, M.D.

## 2015-07-17 NOTE — Progress Notes (Signed)
Subjective:   Alert and oriented x 3 today. Feeling better  Objective Filed Vitals:   07/17/15 0400 07/17/15 0500 07/17/15 0700 07/17/15 0814  BP: 111/45 162/63 144/63   Pulse: 67 73 66   Temp:    99.2 F (37.3 C)  TempSrc:    Oral  Resp: 20 19 17    Height:      Weight:  65.4 kg (144 lb 2.9 oz)    SpO2: 93% 99% 100%    Physical Exam General: alert, O x 3, no distress, up in chair Heart: RRR Lungs: CTA, unlabored Abdomen: soft, nontender +BS  Extremities: no edema Dialysis Access:  Cath patent on HD  Dialysis Orders: MWF [facility] 4 hrs   70.5kgs    2K/2.5CA   L IJ Cath (does not have any perm access)  Heparin none Micera 75q 2 weeks Calcitriol 0.25    Assessment/Plan: 1. Seizures (new) / bilat acute CVA's - on depakote, EEG +. New acute scattered CVA's by MRI. Cardiology suspects intermittent afib.  2. AMS - transient episode of hypotension related to BP meds and excessive UF last HD. BP's back to high normal.  She runs high at baseline. MS back to normal.   3. Elevated troponin- cardiology following. ECHO yesterday. EF 50-55% 4. ESRD - MWF [place]. HD today, no fluid off. Below dry wt.  5. Hypertension/volume - BP's ok now. 5 kg under dry wt, will let volume come up some. No UF w HD for now.  6. Anemia - hgb 9.4- on micera outpt- start Aranesp here 7. Metabolic bone disease - Cont renvela. And calcitriol per outpt orders 8. Nutrition - NPO  alb 3.2 9. DM- per primary   Vinson Moselle MD pager [phone]    cell [phone] 07/17/2015, 9:47 AM     Additional Objective Labs: Basic Metabolic Panel:  Recent Labs Lab 07/15/15 0337 07/16/15 0241 07/17/15 0224  NA 137 134* 133*  K 4.2 5.0 4.3  CL 101 98* 99*  CO2 25 23 25   GLUCOSE 97 77 87  BUN 31* 43* 21*  CREATININE 4.55* 6.01* 3.85*  CALCIUM 8.4* 8.2* 8.5*   Liver Function Tests:  Recent Labs Lab 07/13/15 0344  AST 13*  ALT 10*  ALKPHOS 47  BILITOT 0.6  PROT 6.8  ALBUMIN 3.2*   No  results for input(s): LIPASE, AMYLASE in the last 168 hours. CBC:  Recent Labs Lab 07/13/15 0344 07/14/15 0327 07/15/15 0337 07/16/15 0241 07/17/15 0224  WBC 10.7* 8.3 10.6* 10.2 9.1  HGB 9.5* 9.4* 10.5* 10.1* 9.6*  HCT 30.2* 29.8* 33.2* 32.2* 29.9*  MCV 90.4 90.6 90.7 90.7 89.5  PLT 156 143* 151 147* 157   Blood Culture    Component Value Date/Time   SDES PERICARDIAL FLUID 02/26/2013 1557   SPECREQUEST NONE 02/26/2013 1557   CULT NO GROWTH 3 DAYS 02/26/2013 1557   REPTSTATUS 03/02/2013 FINAL 02/26/2013 1557    Cardiac Enzymes:  Recent Labs Lab 07/13/15 1128 07/13/15 1610 07/15/15 0834 07/15/15 1638 07/15/15 1942  TROPONINI 0.06* 0.05* 0.06* 0.06* 0.05*   CBG:  Recent Labs Lab 07/15/15 0747 07/15/15 1726 07/16/15 0008 07/16/15 1313 07/17/15 0815  GLUCAP 92 104* 87 136* 78   Iron Studies: No results for input(s): IRON, TIBC, TRANSFERRIN, FERRITIN in the last 72 hours. @lablastinr3 @ Studies/Results: Mr Brain Wo Contrast  07/15/2015   CLINICAL DATA:  Stroke with cerebral ischemia  EXAM: MRI HEAD WITHOUT CONTRAST  TECHNIQUE: Multiplanar, multiecho pulse sequences of the brain and surrounding  structures were obtained without intravenous contrast.  COMPARISON:  CT head 07/14/2015  FINDINGS: Multiple areas of restricted diffusion bilaterally compatible with acute infarction. Acute infarct in the occipital lobes bilaterally left greater than right. The left-sided stroke measures up to 11 mm. Small area of acute infarct in the right frontal operculum and in the left frontal white matter.  Moderate to advanced chronic microvascular ischemic changes in the white matter bilaterally. Chronic infarcts in the thalamus bilaterally.  Chronic blood products in the left temporal lobe and in the pontomedullary junction compatible with prior hemorrhage. This is likely related to poorly controlled hypertension.  Moderate atrophy. Ventricular enlargement consistent with atrophy. No  hydrocephalus. Negative for mass lesion.  Normal orbit.  Paranasal sinuses clear.  Image quality degraded by motion  IMPRESSION: Atrophy and moderate to advanced chronic ischemia. Chronic hemorrhage in the brainstem and left temporal lobe due to chronic hypertension  Acute infarcts in the occipital lobe and frontal lobes bilaterally.   Electronically Signed   By: Marlan Palau M.D.   On: 07/15/2015 14:45   Dg Chest Port 1 View  07/15/2015   CLINICAL DATA:  E86.9 (ICD-10-CM) - Volume depletion  EXAM: PORTABLE CHEST 1 VIEW  COMPARISON:  08/28/2014  FINDINGS: Left-sided dialysis catheter tip to the upper right atrium. Cardiopericardial silhouette is enlarged and stable. Lungs are clear. No pulmonary edema.  IMPRESSION: No significant change in marked enlargement of the cardiopericardial silhouette.   Electronically Signed   By: Norva Pavlov M.D.   On: 07/15/2015 10:20   Medications: . niCARDipine Stopped (07/14/15 1800)   . antiseptic oral rinse  7 mL Mouth Rinse BID  . aspirin EC  325 mg Oral Daily  . calcitRIOL  0.25 mcg Oral Q M,W,F-HD  . darbepoetin (ARANESP) injection - DIALYSIS  60 mcg Intravenous Once  . heparin  5,000 Units Subcutaneous 3 times per day  . insulin aspart  0-9 Units Subcutaneous TID WC  . insulin glargine  3 Units Subcutaneous Daily  . pantoprazole (PROTONIX) IV  40 mg Intravenous Q24H  . pravastatin  40 mg Oral q1800  . regadenoson  0.4 mg Intravenous Once  . sevelamer carbonate  800 mg Oral TID WC  . sodium chloride  3 mL Intravenous Q12H  . valproate sodium  500 mg Intravenous Q12H

## 2015-07-17 NOTE — Evaluation (Signed)
Clinical/Bedside Swallow Evaluation Patient Details  Name: Gail Chapman MRN: [id number] Date of Birth: 02-19-1956  Today's Date: 07/17/2015 Time: SLP Start Time (ACUTE ONLY): 0845 SLP Stop Time (ACUTE ONLY): 0905 SLP Time Calculation (min) (ACUTE ONLY): 20 min  Past Medical History:  Past Medical History  Diagnosis Date  . Diabetes mellitus without complication (HCC)   . Chronic diastolic CHF (congestive heart failure) (HCC)     a. 01/2007 MV: No isch/infarct, nl EF;  b. 2012 Cath: "normal" per patient.  Performed in [place], [place] by Dr. Graciela Husbands.  . Pulmonary hypertension (HCC)     a. on home O2 @ 3lpm 24hrs/day  . HTN (hypertension)     a. Dx in 1993.  Marland Kitchen LVH (left ventricular hypertrophy)   . OSA (obstructive sleep apnea)     a. uses CPAP  . Physical deconditioning   . Renal disorder   . COPD (chronic obstructive pulmonary disease) (HCC)    Past Surgical History:  Past Surgical History  Procedure Laterality Date  . Knee surgery    . Carpal tunnel release    . Tubal ligation    . Cholecystectomy    . Mass excision      a. under axilla.  . Subxyphoid pericardial window N/A 02/26/2013    Procedure: SUBXYPHOID PERICARDIAL WINDOW;  Surgeon: Delight Ovens, MD;  Location: [facility];  Service: Thoracic;  Laterality: N/A;  . Left and right heart catheterization with coronary angiogram N/A 02/25/2013    Procedure: LEFT AND RIGHT HEART CATHETERIZATION WITH CORONARY ANGIOGRAM;  Surgeon: Dolores Patty, MD;  Location: [facility] CATH LAB;  Service: Cardiovascular;  Laterality: N/A;   HPI:  59 y.o. female with PMH of MS, chronic bilateral leg weakness (using wheelchair), hypertension, hyperlipidemia, diabetes mellitus, diastolic congestive heart failure, pulmonary hypertension, OSA on CPAP, COPD on 3 L oxygen at home, and end-stage renal disease-HD (MWF), who presents with altered mental status, dizziness, seizure;  07/15/15 MRI head indicated Atrophy and moderate to advanced chronic ischemia.  Chronic hemorrhage in the brainstem and left temporal lobe due to chronic hypertension Acute infarcts in the occipital lobe and frontal lobes bilaterally.   Assessment / Plan / Recommendation Clinical Impression   Pt with oral holding and prolonged oral transit (with solids) in conjunction with probable delay in the initiation of the swallow initially, but decreased with additional swallows; no s/s of aspiration noted during BSE, but pt exhibits overall decrease in processing information and difficulty with following multiple step directives; limited OME d/t cognitive issues; SLE warranted d/t recent MRI results; ST to f/u for diet tolerance and language/cognitive deficits.    Aspiration Risk  Mild    Diet Recommendation Dysphagia 3 (Mech soft);Thin   Medication Administration: Whole meds with puree Compensations: Slow rate;Small sips/bites    Other  Recommendations Oral Care Recommendations: Oral care BID Other Recommendations: Other (Comment) (Watch for swallow response prior to additional bites/sips)   Follow Up Recommendations       Frequency and Duration min 2x/week  2 weeks   Pertinent Vitals/Pain Elevated BP; low grade fever    SLP Swallow Goals  See POC   Swallow Study Prior Functional Status   Modified independent at home    General Date of Onset: 07/12/15 Other Pertinent Information: 59 y.o. female with PMH of MS, chronic bilateral leg weakness (using wheelchair), hypertension, hyperlipidemia, diabetes mellitus, diastolic congestive heart failure, pulmonary hypertension, OSA on CPAP, COPD on 3 L oxygen at home, and end-stage renal disease-HD (MWF), who  presents with altered mental status, dizziness, seizure. Type of Study: Bedside swallow evaluation Previous Swallow Assessment: 10/4 BSE s/o Diet Prior to this Study: Thin liquids Temperature Spikes Noted: No (low grade fever (99)) Respiratory Status: Supplemental O2 delivered via (comment) History of Recent  Intubation: No Behavior/Cognition: Cooperative;Lethargic/Drowsy;Requires cueing Oral Cavity - Dentition: Missing dentition Self-Feeding Abilities: Needs assist Patient Positioning: Upright in chair/Tumbleform Baseline Vocal Quality: Normal Volitional Cough: Weak Volitional Swallow: Unable to elicit    Oral/Motor/Sensory Function Overall Oral Motor/Sensory Function: Appears within functional limits for tasks assessed (limited OME d/t difficulty following directives)   Ice Chips Ice chips: Not tested   Thin Liquid Thin Liquid: Impaired Presentation: Cup;Straw Oral Phase Functional Implications: Oral holding Pharyngeal  Phase Impairments: Suspected delayed Swallow    Nectar Thick Nectar Thick Liquid: Not tested   Honey Thick Honey Thick Liquid: Not tested   Puree Puree: Impaired Presentation: Spoon Oral Phase Functional Implications: Oral holding Pharyngeal Phase Impairments: Suspected delayed Swallow   Solid       Solid: Impaired Presentation: Spoon Oral Phase Impairments: Impaired mastication Oral Phase Functional Implications: Oral residue;Oral holding Pharyngeal Phase Impairments: Suspected delayed Swallow       Joel Cowin,PAT, M.S., CCC-SLP 07/17/2015,9:17 AM

## 2015-07-17 NOTE — Progress Notes (Signed)
Vascular Ultrasound Transcranial Doppler has been completed.   07/17/2015 2:21 PM Gertie Fey, RVT, RDCS, RDMS

## 2015-07-17 NOTE — Progress Notes (Signed)
RT note: Placed patient on CPAP previous settings with 3L O2 bleed in. Resting comfortably. Vital signs HR 76 Sats 97% rate 14.

## 2015-07-17 NOTE — Progress Notes (Signed)
Patient transferred from MICU with RN and nurse Tech. Patient is alert and oriented x3. Skin is clean and dry, scab on 2nd toe of left foot. Right AC IV infusing. Patient denies any pain. Patient has been oriented to floor and staff, orders have been reviewed. Call bell placed within reach, bed in the lowest position and bed alarm has been activated. No family at bedside. Will continue to monitor patient.

## 2015-07-17 NOTE — Evaluation (Signed)
Physical Therapy Evaluation Patient Details Name: Gail Chapman MRN: [id number] DOB: 30-Jun-1956 Today's Date: 07/17/2015   History of Present Illness  Pt is a 59 y/o female with a PMH of DM, uncontrolled HTN, CHF, COPD, ESRD (MWF), who initially presented 10/3 with confusion and probable seizure. On 10/5 after HD she had episode of AMS with acute HTN with SBP 200. MRI on 10/6 revealed acute infarcts in occipital love and frontal lobes bilaterally as well as chronic hemorrhage in brainstem and L temporal lobe due to chronic HTN.   Clinical Impression  Pt admitted with above diagnosis. Pt currently with functional limitations due to the deficits listed below (see PT Problem List). At the time of PT eval pt was very slow to respond with difficulty keeping eyes open for first 1/2 of session. Pt required increased time and cueing for one-word answers (intermittently) and to follow simple commands (occasionally). She was not able to provide any details of history or PLOF, however this pt will need post-acute rehab prior to return home. Pt will benefit from skilled PT to increase their independence and safety with mobility to allow discharge to the venue listed below.       Follow Up Recommendations SNF;Supervision/Assistance - 24 hour    Equipment Recommendations  Wheelchair (measurements PT);Wheelchair cushion (measurements PT)    Recommendations for Other Services       Precautions / Restrictions Precautions Precautions: Fall Restrictions Weight Bearing Restrictions: No      Mobility  Bed Mobility Overal bed mobility: Needs Assistance;+2 for physical assistance Bed Mobility: Supine to Sit     Supine to sit: Max assist;+2 for physical assistance;HOB elevated     General bed mobility comments: Assist for all aspects of bed mobility. Increased cueing required for pt to keep eyes open and respond verbally to questions.   Transfers Overall transfer level: Needs assistance Equipment  used: 2 person hand held assist Transfers: Squat Pivot Transfers     Squat pivot transfers: Total assist;+2 physical assistance     General transfer comment: Bed pad used for increased support under pt's hips during SPT. Very minimal to no participation in transfer.   Ambulation/Gait                Stairs            Wheelchair Mobility    Modified Rankin (Stroke Patients Only)       Balance Overall balance assessment: Needs assistance Sitting-balance support: Feet supported;No upper extremity supported Sitting balance-Leahy Scale: Poor     Standing balance support: During functional activity;No upper extremity supported Standing balance-Leahy Scale: Zero Standing balance comment: Pt not able to hold to therapist and tech for UE support.                              Pertinent Vitals/Pain Pain Assessment: Faces Faces Pain Scale: No hurt    Home Living Family/patient expects to be discharged to:: Skilled nursing facility Living Arrangements: Spouse/significant other                    Prior Function           Comments: Pt was unable to provide any details of history or PLOF.      Hand Dominance        Extremity/Trunk Assessment   Upper Extremity Assessment: Defer to OT evaluation (Minimal active movement of UE's noted. )  Lower Extremity Assessment: Generalized weakness (Minimal active movement/muscle initiation)      Cervical / Trunk Assessment: Normal  Communication   Communication: Expressive difficulties;Receptive difficulties  Cognition Arousal/Alertness: Lethargic Behavior During Therapy: Flat affect Overall Cognitive Status: Impaired/Different from baseline Area of Impairment: Orientation;Attention;Following commands;Awareness;Problem solving Orientation Level:  (Responds to her name. No other indications of orientation) Current Attention Level: Focused   Following Commands: Follows one step  commands inconsistently;Follows one step commands with increased time   Awareness: Intellectual Problem Solving: Slow processing;Decreased initiation;Difficulty sequencing;Requires verbal cues;Requires tactile cues      General Comments      Exercises        Assessment/Plan    PT Assessment Patient needs continued PT services  PT Diagnosis Difficulty walking;Generalized weakness   PT Problem List Decreased strength;Decreased range of motion;Decreased activity tolerance;Decreased balance;Decreased mobility;Decreased coordination;Decreased cognition;Decreased knowledge of use of DME;Decreased safety awareness;Decreased knowledge of precautions;Cardiopulmonary status limiting activity  PT Treatment Interventions DME instruction;Stair training;Gait training;Functional mobility training;Therapeutic activities;Therapeutic exercise;Neuromuscular re-education;Patient/family education   PT Goals (Current goals can be found in the Care Plan section) Acute Rehab PT Goals PT Goal Formulation: Patient unable to participate in goal setting Time For Goal Achievement: 07/31/15 Potential to Achieve Goals: Fair    Frequency Min 3X/week   Barriers to discharge        Co-evaluation               End of Session Equipment Utilized During Treatment: Gait belt;Oxygen Activity Tolerance: Patient limited by fatigue;Patient limited by lethargy Patient left: in chair;with call bell/phone within reach;with chair alarm set;Other (comment) (SLP present in room at end of session) Nurse Communication: Mobility status;Need for lift equipment         Time: 0831-0850 PT Time Calculation (min) (ACUTE ONLY): 19 min   Charges:   PT Evaluation $Initial PT Evaluation Tier I: 1 Procedure     PT G Codes:        Conni Slipper 06-Aug-2015, 10:53 AM   Conni Slipper, PT, DPT Acute Rehabilitation Services Pager: [phone]

## 2015-07-17 NOTE — Progress Notes (Signed)
STROKE TEAM PROGRESS NOTE   HISTORY Gail Chapman is an 59 y.o. female history of multiple sclerosis, diabetes mellitus, coronary hypertension, congestive heart failure, hypertension, COPD and end-stage renal disease on dialysis, transferred from [facility] for evaluation after brought to the ED there following a witnessed generalized seizure. Patient has no history of seizure disorder or previous seizure activity. 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 Patient was initially confused, but status has returned to baseline. She was hypertensive and required acute intervention, including Cardene drip. Patient is not ambulatory and lives at home with her spouse who is her primary caretaker. She requires help with ADLs. Her last MS flare was in 2010. She has experienced slowly progressive weakness of extremities, however, in the interim.  Neurohospitalists have followed since admission. On Depakote for seizure. MRI showed acute infarcts in the occipital and frontal lobes. Patient was not administered TPA secondary to incidental finding; unknown time of onset. She was transferred to the stroke service for further evaluation and treatment.   SUBJECTIVE (INTERVAL HISTORY) Her husband is at the bedside.  No family present. Overall she feels her condition is stable. Husband informs me that patient has always had difficult control blood pressure as an outpatient.   OBJECTIVE Temp:  [97.5 F (36.4 C)-99.8 F (37.7 C)] 99.2 F (37.3 C) (10/08 0814) Pulse Rate:  [66-83] 73 (10/08 1000) Cardiac Rhythm:  [-] Normal sinus rhythm (10/08 0800) Resp:  [16-25] 18 (10/08 1000) BP: (111-181)/(44-75) 140/57 mmHg (10/08 1000) SpO2:  [93 %-100 %] 100 % (10/08 1000) Weight:  [142 lb 3.2 oz (64.5 kg)-144 lb 2.9 oz (65.4 kg)] 144 lb 2.9 oz (65.4 kg) (10/08 0500)  CBC:    Recent Labs Lab 07/16/15 0241 07/17/15 0224  WBC 10.2 9.1  HGB 10.1* 9.6*  HCT 32.2* 29.9*  MCV 90.7 89.5  PLT 147* 157    Basic Metabolic Panel:   Recent Labs Lab 07/16/15 0241 07/17/15 0224  NA 134* 133*  K 5.0 4.3  CL 98* 99*  CO2 23 25  GLUCOSE 77 87  BUN 43* 21*  CREATININE 6.01* 3.85*  CALCIUM 8.2* 8.5*    Lipid Panel:     Component Value Date/Time   CHOL 171 07/13/2015 0344   TRIG 54 07/13/2015 0344   HDL 67 07/13/2015 0344   CHOLHDL 2.6 07/13/2015 0344   VLDL 11 07/13/2015 0344   LDLCALC 93 07/13/2015 0344   HgbA1c:  Lab Results  Component Value Date   HGBA1C 5.9* 07/13/2015   Urine Drug Screen: No results found for: LABOPIA, COCAINSCRNUR, LABBENZ, AMPHETMU, THCU, LABBARB    IMAGING  Ct Head Wo Contrast 07/14/2015    Atrophy with extensive periventricular small vessel disease. Age uncertain small infarcts at several sites as described. No hemorrhage or mass effect. Mild ethmoid sinus disease bilaterally.     Mr Brain Wo Contrast 07/15/2015    Atrophy and moderate to advanced chronic ischemia. Chronic hemorrhage in the brainstem and left temporal lobe due to chronic hypertension  Acute infarcts in the occipital lobe and frontal lobes bilaterally.     2D Echocardiogram   - Left ventricle: The cavity size was normal. There was severeconcentric hypertrophy. Systolic function was normal. Theestimated ejection fraction was in the range of 50% to 55%. Thereis akinesis of the basal-midinferior myocardium. Dopplerparameters are consistent with a reversible restrictive pattern,indicative of decreased left  ventricular diastolic complianceand/or increased left atrial pressure (grade 3 diastolicdysfunction). - Mitral valve: Severely calcified annulus. There was moderateregurgitation. - Left atrium: The atrium was moderately dilated. - Right ventricle: The cavity size was mildly dilated. Wallthickness was normal. - Pulmonary arteries: Systolic pressure  was mildly increased. PApeak pressure: 43 mm Hg (S). - Pericardium, extracardiac: A small to moderate pericardialeffusion was identified posterior to the heart (1.5cm). Featureswere not consistent with tamponade physiology.   PHYSICAL EXAM Middle-aged obese African-American lady not in distress. . Afebrile. Head is nontraumatic. Neck is supple without bruit.    Cardiac exam no murmur or gallop. Lungs are clear to auscultation. Distal pulses are well felt. Dialysis catheter in the neck Neurological Exam :  Cerebellar: 1 only. Diminished attention, registration and recall. No aphasia or apraxia dysarthria. Eye movements are full range without nystagmus. Blinks to threat bilaterally. Fundi were not visualized. Vision acuity and fields seem adequate. Face is symmetric without weakness. Tongue is midline. Motor system exam reveals no upper or lower extremity drift. Weakness of left grip, hand,  hip and ankle dorsiflexors.. Sensation seems preserved bilaterally. Coordination is slow but accurate. Gait was not tested. Deep tendon reflexes are symmetric. Plantars are downgoing.   ASSESSMENT/PLAN [name] is a 59 y.o. female with history of MS, chronic bilateral leg weakness (using wheelchair), hypertension, hyperlipidemia, diabetes mellitus, diastolic congestive heart failure, pulmonary hypertension, OSA on CPAP, COPD on 3 L oxygen at home, and end-stage renal disease-HD (MWF), who was transferred from [place] with altered mental status, dizziness, seizure. MRI found stroke during admission.   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 May consider TCD with bubble if not felt to be a TEE candidate.   Heparin 5000 units sq tid for VTE prophylaxis DIET DYS 3 Room service appropriate?: Yes; Fluid consistency:: Thin  aspirin 325 mg orally every day prior to admission, now on aspirin 325 mg orally every day  Ongoing aggressive stroke risk factor management  Therapy recommendations:  pending  Disposition:  pending   Seizure  On depakote  EEG positive 10/4 & 10/5  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  BP as high as 232/101, as low as 66/30  Stable currently  Hyperlipidemia  Home meds:  No statin   LDL 93, goal < 70  New pravachol 40 added this admission  Continue statin at discharge  Diabetes type II  HgbA1c 5.9, at goal < 7.0  Controlled  Other Stroke Risk Factors  Former Cigarette smoker  Coronary artery disease  Elevated troponin - card consult - possible inferior wall akinesis area by echo that should be evaluated (not urgent).  Obstructive sleep apnea, on CPAP at home, 3L O2  Other Active Problems  ESRD on HD MWF  Anemia of chronic disease  Metabolic bone disease  COPD  Chronic diastolic heart failure  Multiple sclerosis w/ chronic B leg weakness, uses w/c  Pulmonary hypertension   Hospital day # 5  Keltin Baird  [facility] See Amion for Pager information 07/17/2015 11:46 AM  I have personally examined this patient, reviewed notes, independently viewed imaging  studies, participated in medical decision making and plan of care. I have made any additions or clarifications directly to the above note. Agree with note above. She presented with seizures and initial MRI did not show strokes but subsequent one showed bicerebral embolic infarcts etiology to be determined. She remains at risk for seizures, strokes, TIAs, neurological worsening and needs ongoing stroke evaluation and aggressive risk factor control. Recommend check TEE  Long d/w husband at  bedside and answered questions.  Delia Heady, MD Medical Director [facility] Pager: [phone] 07/17/2015 11:46 AM    To contact Stroke Continuity provider, please refer to [email or web address]. After hours, contact General Neurology

## 2015-07-18 DIAGNOSIS — I639 Cerebral infarction, unspecified: Secondary | ICD-10-CM

## 2015-07-18 DIAGNOSIS — I6349 Cerebral infarction due to embolism of other cerebral artery: Secondary | ICD-10-CM

## 2015-07-18 LAB — GLUCOSE, CAPILLARY
GLUCOSE-CAPILLARY: 87 mg/dL (ref 65–99)
GLUCOSE-CAPILLARY: 75 mg/dL (ref 65–99)
GLUCOSE-CAPILLARY: 102 mg/dL — AB (ref 65–99)
GLUCOSE-CAPILLARY: 117 mg/dL — AB (ref 65–99)
Glucose-Capillary: 96 mg/dL (ref 65–99)

## 2015-07-18 LAB — RENAL FUNCTION PANEL
ALBUMIN: 2.7 g/dL — AB (ref 3.5–5.0)
Anion gap: 13 (ref 5–15)
BUN: 35 mg/dL — AB (ref 6–20)
CO2: 23 mmol/L (ref 22–32)
CREATININE: 5.25 mg/dL — AB (ref 0.44–1.00)
Calcium: 8.5 mg/dL — ABNORMAL LOW (ref 8.9–10.3)
Chloride: 98 mmol/L — ABNORMAL LOW (ref 101–111)
GFR calc Af Amer: 10 mL/min — ABNORMAL LOW (ref >60)
GFR, EST NON AFRICAN AMERICAN: 8 mL/min — AB (ref >60)
Glucose, Bld: 105 mg/dL — ABNORMAL HIGH (ref 65–99)
PHOSPHORUS: 6.5 mg/dL — AB (ref 2.5–4.6)
Potassium: 4.7 mmol/L (ref 3.5–5.1)
Sodium: 134 mmol/L — ABNORMAL LOW (ref 135–145)

## 2015-07-18 LAB — CBC
HCT: 30.5 % — ABNORMAL LOW (ref 36.0–46.0)
Hemoglobin: 9.6 g/dL — ABNORMAL LOW (ref 12.0–15.0)
MCH: 28.1 pg (ref 26.0–34.0)
MCHC: 31.5 g/dL (ref 30.0–36.0)
MCV: 89.2 fL (ref 78.0–100.0)
PLATELETS: 160 10*3/uL (ref 150–400)
RBC: 3.42 MIL/uL — ABNORMAL LOW (ref 3.87–5.11)
RDW: 15.6 % — AB (ref 11.5–15.5)
WBC: 7.4 10*3/uL (ref 4.0–10.5)

## 2015-07-18 MED ORDER — DOXAZOSIN MESYLATE 2 MG PO TABS
2.0000 mg | ORAL_TABLET | Freq: Every day | ORAL | Status: DC
  Administered 2015-07-18 – 2015-07-25 (×6): 2 mg via ORAL
  Filled 2015-07-18 (×10): qty 1

## 2015-07-18 MED ORDER — METOPROLOL TARTRATE 1 MG/ML IV SOLN: 5.0000 mg | Freq: Once | INTRAVENOUS | Status: DC

## 2015-07-18 MED ORDER — METOPROLOL TARTRATE 1 MG/ML IV SOLN
5.0000 mg | INTRAVENOUS | Status: DC | PRN
  Administered 2015-07-18 – 2015-07-26 (×3): 5 mg via INTRAVENOUS
  Filled 2015-07-18 (×3): qty 5

## 2015-07-18 MED ORDER — CARVEDILOL 12.5 MG PO TABS
12.5000 mg | ORAL_TABLET | Freq: Two times a day (BID) | ORAL | Status: DC
  Administered 2015-07-18 – 2015-07-26 (×11): 12.5 mg via ORAL
  Filled 2015-07-18 (×16): qty 1

## 2015-07-18 NOTE — Progress Notes (Signed)
[facility] Progress Note   Gail Chapman  ZOX:[id number]  DOB: [birth year]  DOA: 07/12/2015 PCP: No PCP Per Patient  Brief narrative: Gail Chapman is a 59 y.o. female with chronic bilateral lower extremity weakness who is wheelchair bound, hypertension, hyperlipidemia, diabetes mellitus, diastolic heart failure and pulmonary hypertension, COPD on 3 L of oxygen, end-stage renal disease on hemodialysis and obstructive sleep apnea. The patient presented to the hospital with confusion and dizziness and a witnessed seizure. Seizure episode lasted for about 2-3 minutes. She was initially brought into [facility] ER and noted to have hypertensive urgency with a blood pressure of 260/1:30 and a troponin of 0.46. Cardizem infusion was started in the ER. She had admitted to missing hemodialysis on that day.  Initial CT scan of the head showed age indeterminate small infarcts. An MRI revealed chronic hemorrhage in the brainstem and left temporal lobe secondary to chronic hypertension but also acute infarcts in the occipital lobe and frontal lobes bilaterally. She was transferred to [facility] and evaluated by neurology.   Subjective: Sitting up in a chair. Poorly communicative. No complaints of nausea, vomiting, diarrhea, cough, shortness of breath or headache.  Assessment/Plan: Principal Problem:   Acute encephalopathy -Suspected to be secondary to accelerated hypertension, CVA and post ictal state -Still appears to be quite slow to respond to questions and intermittently is having difficulty with following commands  Active Problems:   CVA -Neurology suspects a cardioembolic source- lower extremity duplex negative for DVT -Carotid duplex does not reveal any significant stenosis -Transcranial Doppler pending -Transthoracic echo did not reveal a thrombus-TEE pending --Continue full dose aspirin for now -PT recommended skilled nursing facility  Seizure -Neurology has  recommended Depakote - EEG reveals an epileptogenic potential  End-stage renal disease on hemodialysis -Dialysis Monday Wednesday Friday-nephrology following  Accelerated hypertension -BP significantly elevated again today -Resume Cardura and carvedilol -Allergy list mentions hydralazine, labetalol and clonidine  Insulin-dependent diabetes mellitus -Continue Levemir and sliding scale insulin  Mild troponin elevation -Evaluated by cardiology-suspected to be demand ischemia  Generalized weakness PT recommended skilled nursing facility-  Code Status:     Code Status Orders        Start     Ordered   07/12/15 2046  Full code   Continuous     07/12/15 2047     Family Communication: With husband  Disposition Plan: Home versus skilled nursing facility  DVT prophylaxis: Heparin  Consultants:Neurology, nephrology  Procedures: EEG 10/4 Carotid duplex 2-D echo   Antibiotics: Anti-infectives    None      Objective: Filed Weights   07/16/15 1150 07/17/15 0500 07/18/15 0325  Weight: 64.5 kg (142 lb 3.2 oz) 65.4 kg (144 lb 2.9 oz) 65.318 kg (144 lb)    Intake/Output Summary (Last 24 hours) at 07/18/15 1454 Last data filed at 07/18/15 0900  Gross per 24 hour  Intake    565 ml  Output      0 ml  Net    565 ml     Vitals Filed Vitals:   07/18/15 0325 07/18/15 0445 07/18/15 1000 07/18/15 1218  BP:  162/62 218/74 204/77  Pulse:  64 72   Temp:  97.7 F (36.5 C) 98.2 F (36.8 C)   TempSrc:  Axillary Oral   Resp:  16 18   Height:      Weight: 65.318 kg (144 lb)     SpO2:  100% 100%     Exam:  General:  Pt  is alert, not in acute distress  HEENT: No icterus, No thrush, oral mucosa moist  Cardiovascular: regular rate and rhythm, S1/S2 No murmur  Respiratory: clear to auscultation bilaterally   Abdomen: Soft, +Bowel sounds, non tender, non distended, no guarding  MSK: No LE edema, cyanosis or clubbing  Neuro: Left arm and leg 4 out of 5 in  strength-right arm and leg 5 over 5-cranial nerves II through XII intact  Data Reviewed: Basic Metabolic Panel:  Recent Labs Lab 07/14/15 0327 07/15/15 0337 07/16/15 0241 07/17/15 0224 07/18/15 0133  NA 138 137 134* 133* 134*  K 3.8 4.2 5.0 4.3 4.7  CL 100* 101 98* 99* 98*  CO2 GLUCOSE 108* 97 77 87 105*  BUN 59* 31* 43* 21* 35*  CREATININE 7.00* 4.55* 6.01* 3.85* 5.25*  CALCIUM 7.9* 8.4* 8.2* 8.5* 8.5*  PHOS  --   --   --   --  6.5*   Liver Function Tests:  Recent Labs Lab 07/13/15 0344 07/18/15 0133  AST 13*  --   ALT 10*  --   ALKPHOS 47  --   BILITOT 0.6  --   PROT 6.8  --   ALBUMIN 3.2* 2.7*   No results for input(s): LIPASE, AMYLASE in the last 168 hours. No results for input(s): AMMONIA in the last 168 hours. CBC:  Recent Labs Lab 07/14/15 0327 07/15/15 0337 07/16/15 0241 07/17/15 0224 07/18/15 0133  WBC 8.3 10.6* 10.2 9.1 7.4  HGB 9.4* 10.5* 10.1* 9.6* 9.6*  HCT 29.8* 33.2* 32.2* 29.9* 30.5*  MCV 90.6 90.7 90.7 89.5 89.2  PLT 143* 151 147* 157 160   Cardiac Enzymes:  Recent Labs Lab 07/13/15 1128 07/13/15 1610 07/15/15 0834 07/15/15 1638 07/15/15 1942  TROPONINI 0.06* 0.05* 0.06* 0.06* 0.05*   BNP (last 3 results)  Recent Labs  07/13/15 0344  BNP >4500.0*    ProBNP (last 3 results) No results for input(s): PROBNP in the last 8760 hours.  CBG:  Recent Labs Lab 07/17/15 2157 07/17/15 2243 07/18/15 0443 07/18/15 0739 07/18/15 1123  GLUCAP 86 84 87 75 102*    Recent Results (from the past 240 hour(s))  MRSA PCR Screening     Status: None   Collection Time: 07/12/15  7:40 PM  Result Value Ref Range Status   MRSA by PCR NEGATIVE NEGATIVE Final    Comment:        The GeneXpert MRSA Assay (FDA approved for NASAL specimens only), is one component of a comprehensive MRSA colonization surveillance program. It is not intended to diagnose MRSA infection nor to guide or monitor treatment for MRSA  infections.   MRSA PCR Screening     Status: None   Collection Time: 07/14/15  3:05 PM  Result Value Ref Range Status   MRSA by PCR NEGATIVE NEGATIVE Final    Comment:        The GeneXpert MRSA Assay (FDA approved for NASAL specimens only), is one component of a comprehensive MRSA colonization surveillance program. It is not intended to diagnose MRSA infection nor to guide or monitor treatment for MRSA infections.      Studies: No results found.  Scheduled Meds:  Scheduled Meds: . antiseptic oral rinse  7 mL Mouth Rinse BID  . aspirin EC  325 mg Oral Daily  . calcitRIOL  0.25 mcg Oral Q M,W,F-HD  . darbepoetin (ARANESP) injection - DIALYSIS  60 mcg Intravenous Once  . heparin  5,000 Units Subcutaneous 3  times per day  . insulin aspart  0-9 Units Subcutaneous TID WC  . insulin glargine  3 Units Subcutaneous Daily  . metoprolol  5 mg Intravenous Once  . pantoprazole (PROTONIX) IV  40 mg Intravenous Q24H  . pravastatin  40 mg Oral q1800  . regadenoson  0.4 mg Intravenous Once  . sevelamer carbonate  800 mg Oral TID WC  . sodium chloride  3 mL Intravenous Q12H  . valproate sodium  500 mg Intravenous Q12H   Continuous Infusions: . niCARDipine Stopped (07/14/15 1800)    Time spent on care of this patient: 35 min   Gail Szymanowski, MD 07/18/2015, 2:54 PM  LOS: 6 days   [facility] Office  [phone] Pager - Text Page per [email or web address] If 7PM-7AM, please contact night-coverage [email or web address]

## 2015-07-18 NOTE — Progress Notes (Signed)
Subjective:   No complaints- says feeling much better  Objective Filed Vitals:   07/17/15 2246 07/18/15 0053 07/18/15 0325 07/18/15 0445  BP: 180/76 147/59  162/62  Pulse: 68 66  64  Temp: 97.9 F (36.6 C)   97.7 F (36.5 C)  TempSrc:    Axillary  Resp: 18   16  Height:      Weight:   65.318 kg (144 lb)   SpO2: 100%   100%   Physical Exam General: alert and oriented, eating breakfast Heart: RRR  Lungs: CTA, unlabored  Abdomen: soft, nontender +BS  Extremities: no edema Dialysis Access:  L IJ cath  Dialysis Orders: MWF [facility] 4 hrs 70.5kgs 2K/2.5CA L IJ Cath (does not have any perm access) Heparin none Micera 75q 2 weeks Calcitriol 0.25   Assessment/Plan: 1. Seizures (new) / bilat acute CVA's - on depakote, EEG +. New acute scattered CVA's by MRI. Cardiology suspects intermittent afib. TEE pending. 2. AMS - related to relative hypotension episode, resolved w MS back to baseline  3. Elevated troponin- cardiology following. ECHO EF 50-55% 4. ESRD - MWF [place]. HD next on Monday- Below dry wt.  5. Hypertension/volume - BP's variable SBP 130-180s. 5 kg under dry wt No UF w HD for now.  6. Anemia - hgb 9.6- on micera outpt- start Aranesp here 7. Metabolic bone disease - Corr ca 9.5 Cont renvela. And calcitriol per outpt orders 8. Nutrition - dys diet. alb 2.7 9. DM- per primary  Jetty Duhamel, NP [facility] Beeper [phone] 07/18/2015,9:37 AM  LOS: 6 days   Pt seen, examined and agree w A/P as above.  Vinson Moselle MD [facility] pager [phone]    cell [phone] 07/18/2015, 1:35 PM    Additional Objective Labs: Basic Metabolic Panel:  Recent Labs Lab 07/16/15 0241 07/17/15 0224 07/18/15 0133  NA 134* 133* 134*  K 5.0 4.3 4.7  CL 98* 99* 98*  CO2 23 25 23   GLUCOSE 77 87 105*  BUN 43* 21* 35*  CREATININE 6.01* 3.85* 5.25*  CALCIUM 8.2* 8.5* 8.5*  PHOS  --   --  6.5*   Liver Function  Tests:  Recent Labs Lab 07/13/15 0344 07/18/15 0133  AST 13*  --   ALT 10*  --   ALKPHOS 47  --   BILITOT 0.6  --   PROT 6.8  --   ALBUMIN 3.2* 2.7*   No results for input(s): LIPASE, AMYLASE in the last 168 hours. CBC:  Recent Labs Lab 07/14/15 0327 07/15/15 0337 07/16/15 0241 07/17/15 0224 07/18/15 0133  WBC 8.3 10.6* 10.2 9.1 7.4  HGB 9.4* 10.5* 10.1* 9.6* 9.6*  HCT 29.8* 33.2* 32.2* 29.9* 30.5*  MCV 90.6 90.7 90.7 89.5 89.2  PLT 143* 151 147* 157 160   Blood Culture    Component Value Date/Time   SDES PERICARDIAL FLUID 02/26/2013 1557   SPECREQUEST NONE 02/26/2013 1557   CULT NO GROWTH 3 DAYS 02/26/2013 1557   REPTSTATUS 03/02/2013 FINAL 02/26/2013 1557    Cardiac Enzymes:  Recent Labs Lab 07/13/15 1128 07/13/15 1610 07/15/15 0834 07/15/15 1638 07/15/15 1942  TROPONINI 0.06* 0.05* 0.06* 0.06* 0.05*   CBG:  Recent Labs Lab 07/17/15 1750 07/17/15 2157 07/17/15 2243 07/18/15 0443 07/18/15 0739  GLUCAP 84 86 84 87 75   Iron Studies: No results for input(s): IRON, TIBC, TRANSFERRIN, FERRITIN in the last 72 hours. @lablastinr3 @ Studies/Results: No results found. Medications: . niCARDipine Stopped (07/14/15 1800)   .  antiseptic oral rinse  7 mL Mouth Rinse BID  . aspirin EC  325 mg Oral Daily  . calcitRIOL  0.25 mcg Oral Q M,W,F-HD  . darbepoetin (ARANESP) injection - DIALYSIS  60 mcg Intravenous Once  . heparin  5,000 Units Subcutaneous 3 times per day  . insulin aspart  0-9 Units Subcutaneous TID WC  . insulin glargine  3 Units Subcutaneous Daily  . pantoprazole (PROTONIX) IV  40 mg Intravenous Q24H  . pravastatin  40 mg Oral q1800  . regadenoson  0.4 mg Intravenous Once  . sevelamer carbonate  800 mg Oral TID WC  . sodium chloride  3 mL Intravenous Q12H  . valproate sodium  500 mg Intravenous Q12H

## 2015-07-18 NOTE — Progress Notes (Signed)
Gave Report to [facility]. Communicated that pt's husband not to give Renvela in the AM. Pt's husband also requested that Insulin not to be given only if CBG greater than 125. Also communicated that patient wears CPAP at night and Pt has history of HTN,  sys goal 160-180 per MD Feinstein's note.

## 2015-07-18 NOTE — Progress Notes (Signed)
Placed patient on CPAP for the night with oxygen set at 3lpm.  

## 2015-07-18 NOTE — Progress Notes (Signed)
STROKE TEAM PROGRESS NOTE   SUBJECTIVE (INTERVAL HISTORY) Her husband is at the bedside. She is sitting in chair, mild restless but able to follow commands and cooperative on exam. Overall she feels her condition is stable. Waiting for TEE in am.   OBJECTIVE Temp:  [97.7 F (36.5 C)-98.6 F (37 C)] 98.2 F (36.8 C) (10/09 1000) Pulse Rate:  [64-73] 72 (10/09 1000) Cardiac Rhythm:  [-] Normal sinus rhythm (10/09 0855) Resp:  [16-19] 18 (10/09 1000) BP: (131-218)/(56-83) 204/77 mmHg (10/09 1218) SpO2:  [100 %] 100 % (10/09 1000) Weight:  [144 lb (65.318 kg)] 144 lb (65.318 kg) (10/09 0325)  CBC:   Recent Labs Lab 07/17/15 0224 07/18/15 0133  WBC 9.1 7.4  HGB 9.6* 9.6*  HCT 29.9* 30.5*  MCV 89.5 89.2  PLT 157 160    Basic Metabolic Panel:   Recent Labs Lab 07/17/15 0224 07/18/15 0133  NA 133* 134*  K 4.3 4.7  CL 99* 98*  CO2 25 23  GLUCOSE 87 105*  BUN 21* 35*  CREATININE 3.85* 5.25*  CALCIUM 8.5* 8.5*  PHOS  --  6.5*    Lipid Panel:     Component Value Date/Time   CHOL 171 07/13/2015 0344   TRIG 54 07/13/2015 0344   HDL 67 07/13/2015 0344   CHOLHDL 2.6 07/13/2015 0344   VLDL 11 07/13/2015 0344   LDLCALC 93 07/13/2015 0344   HgbA1c:  Lab Results  Component Value Date   HGBA1C 5.9* 07/13/2015   Urine Drug Screen: No results found for: LABOPIA, COCAINSCRNUR, LABBENZ, AMPHETMU, THCU, LABBARB    IMAGING I have personally reviewed the radiological images below and agree with the radiology interpretations.  Ct Head Wo Contrast 07/14/2015    Atrophy with extensive periventricular small vessel disease. Age uncertain small infarcts at several sites as described. No hemorrhage or mass effect. Mild ethmoid sinus disease bilaterally.     Mr Brain Wo Contrast 07/15/2015    Atrophy and moderate to advanced chronic ischemia. Chronic hemorrhage in the brainstem and left temporal lobe due to chronic hypertension  Acute infarcts in the occipital lobe and frontal  lobes bilaterally.     2D Echocardiogram   - Left ventricle: The cavity size was normal. There was severeconcentric hypertrophy. Systolic function was normal. Theestimated ejection fraction was in the range of 50% to 55%. Thereis akinesis of the basal-midinferior myocardium. Dopplerparameters are consistent with a reversible restrictive pattern,indicative of decreased left ventricular diastolic complianceand/or increased left atrial pressure (grade 3 diastolicdysfunction). - Mitral valve: Severely calcified annulus. There was moderateregurgitation. - Left atrium: The atrium was moderately dilated. - Right ventricle: The cavity size was mildly dilated. Wallthickness was normal. - Pulmonary arteries: Systolic pressure was mildly increased. PApeak pressure: 43 mm Hg (S). - Pericardium, extracardiac: A small to moderate pericardialeffusion was identified posterior to the heart (1.5cm). Featureswere not consistent with tamponade physiology.  EEG 07/13/15 This is an abnormal EEG secondary to focal sharp wave activity in the frontal region on the left with phase reversal at F7. This finding is suggestive of a focal disturbance with epileptogenic potential.   EEG 07/14/15 This is an abnormal EEG secondary to focal sharp wave activity in the frontal regions bilaterally (left more than right) with phase reversal at [facility] most predominantly. This finding is suggestive of a focal disturbance with epileptogenic potential.  CUS - Bilateral: 1-39% ICA stenosis. Vertebral artery flow is antegrade.  LE venous doppler - no DVT  TCD pending   PHYSICAL EXAM Middle-aged  obese African-American lady not in distress. Sitting in chair, mildly restless. Afebrile. Head is nontraumatic. Neck is supple without bruit.  Cardiac exam no murmur or gallop. Lungs are clear to auscultation. Distal pulses are well felt.   Neurological Exam :  AAOx2, not orientated to time. Fund of knowledge impaired not knowing current  president. Diminished attention, registration and recall. No aphasia or apraxia dysarthria, able to name and repeat. But psychomotor slowing. Eye movements are full range without nystagmus. Blinks to threat bilaterally. Fundi were not visualized. Vision acuity and fields seem adequate. Face is symmetric without weakness. Tongue is midline. Motor system exam reveals no upper or lower extremity drift. Sensation seems preserved bilaterally. Coordination is slow but accurate. Gait was not tested. Deep tendon reflexes are symmetric. Plantars are downgoing.    ASSESSMENT/PLAN Gail Chapman is a 59 y.o. female with history of MS, chronic bilateral leg weakness (using wheelchair), hypertension, hyperlipidemia, diabetes mellitus, diastolic congestive heart failure, pulmonary hypertension, OSA on CPAP, COPD on 3 L oxygen at home, and end-stage renal disease-HD (MWF), who was transferred from [place] with altered mental status, dizziness, seizure. MRI found stroke during admission.   Stroke: occipital lobe and bilateral frontal lobe small infarcts, embolic pattern secondary to unknown source, hypoperfusion due to hypotension vs. Afib. Pt had hypotensive episodes correlating the time period of infarct. Most of punctate infarct support watershed distribution except one in the right MCA territory.   Resultant no significant focal deficit, but psychomotor slowing  MRI  occipital lobe and bilateral frontal lobe small infarcts  TCD pending   Carotid Doppler  1-39% bilateral stenosis  2D Echo  No source of embolus    LE venous doppler negative for DVT  recommend TEE to look for embolic source. Cardiology also recommend loop recorder if TEE  unrevealing   Heparin 5000 units sq tid for VTE prophylaxis DIET DYS 3 Room service appropriate?: Yes; Fluid consistency:: Thin  aspirin 325 mg orally every day prior to admission, now on aspirin 325 mg orally every day.   Ongoing aggressive stroke risk factor  management  Therapy recommendations:  pending  Disposition:  pending   Seizure  On depakote  EEG positive 10/4 & 10/5  Repeat EEG in am  Check depakote levels  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  BP as high as 232/101, as low as 66/30  Stable currently  BP goal normotensive  Hyperlipidemia  Home meds:  No statin   LDL 93, goal < 70  New pravachol 40 added this admission  Continue statin at discharge  Diabetes type II  HgbA1c 5.9, at goal < 7.0  Controlled  Other Stroke Risk Factors  Former Cigarette smoker  Coronary artery disease  Elevated troponin - card consult - possible inferior wall akinesis area by echo that should be evaluated (not urgent).  Obstructive sleep apnea, on CPAP at home, 3L O2  Other Active Problems  ESRD on HD MWF  Anemia of chronic disease  Metabolic bone disease  COPD  Chronic diastolic heart failure  Multiple sclerosis w/ chronic B leg weakness, uses w/c  Pulmonary hypertension   Hospital day # 6  Marvel Plan, MD PhD Stroke Neurology 07/18/2015 5:46 PM     To contact Stroke Continuity provider, please refer to [email or web address]. After hours, contact General Neurology

## 2015-07-19 ENCOUNTER — Inpatient Hospital Stay (HOSPITAL_COMMUNITY): Payer: Medicare Other

## 2015-07-19 LAB — CBC
HEMATOCRIT: 31.5 % — AB (ref 36.0–46.0)
HEMATOCRIT: 29.7 % — AB (ref 36.0–46.0)
Hemoglobin: 9.7 g/dL — ABNORMAL LOW (ref 12.0–15.0)
Hemoglobin: 9.3 g/dL — ABNORMAL LOW (ref 12.0–15.0)
MCH: 27.4 pg (ref 26.0–34.0)
MCH: 27.9 pg (ref 26.0–34.0)
MCHC: 30.8 g/dL (ref 30.0–36.0)
MCHC: 31.3 g/dL (ref 30.0–36.0)
MCV: 89 fL (ref 78.0–100.0)
MCV: 89.2 fL (ref 78.0–100.0)
Platelets: 174 10*3/uL (ref 150–400)
Platelets: 170 10*3/uL (ref 150–400)
RBC: 3.54 MIL/uL — ABNORMAL LOW (ref 3.87–5.11)
RBC: 3.33 MIL/uL — ABNORMAL LOW (ref 3.87–5.11)
RDW: 15.7 % — AB (ref 11.5–15.5)
RDW: 15.8 % — ABNORMAL HIGH (ref 11.5–15.5)
WBC: 5.5 10*3/uL (ref 4.0–10.5)
WBC: 6.2 10*3/uL (ref 4.0–10.5)

## 2015-07-19 LAB — RENAL FUNCTION PANEL
ALBUMIN: 2.7 g/dL — AB (ref 3.5–5.0)
ANION GAP: 12 (ref 5–15)
BUN: 59 mg/dL — ABNORMAL HIGH (ref 6–20)
CO2: 24 mmol/L (ref 22–32)
Calcium: 8.3 mg/dL — ABNORMAL LOW (ref 8.9–10.3)
Chloride: 97 mmol/L — ABNORMAL LOW (ref 101–111)
Creatinine, Ser: 6.5 mg/dL — ABNORMAL HIGH (ref 0.44–1.00)
GFR calc non Af Amer: 6 mL/min — ABNORMAL LOW (ref >60)
GFR, EST AFRICAN AMERICAN: 7 mL/min — AB (ref >60)
GLUCOSE: 95 mg/dL (ref 65–99)
PHOSPHORUS: 7.4 mg/dL — AB (ref 2.5–4.6)
POTASSIUM: 5.3 mmol/L — AB (ref 3.5–5.1)
Sodium: 133 mmol/L — ABNORMAL LOW (ref 135–145)

## 2015-07-19 LAB — BASIC METABOLIC PANEL
ANION GAP: 14 (ref 5–15)
BUN: 57 mg/dL — ABNORMAL HIGH (ref 6–20)
CALCIUM: 8.4 mg/dL — AB (ref 8.9–10.3)
CHLORIDE: 96 mmol/L — AB (ref 101–111)
CO2: 24 mmol/L (ref 22–32)
Creatinine, Ser: 6.6 mg/dL — ABNORMAL HIGH (ref 0.44–1.00)
GFR calc Af Amer: 7 mL/min — ABNORMAL LOW (ref >60)
GFR calc non Af Amer: 6 mL/min — ABNORMAL LOW (ref >60)
GLUCOSE: 91 mg/dL (ref 65–99)
Potassium: 5.2 mmol/L — ABNORMAL HIGH (ref 3.5–5.1)
Sodium: 134 mmol/L — ABNORMAL LOW (ref 135–145)

## 2015-07-19 LAB — GLUCOSE, CAPILLARY
GLUCOSE-CAPILLARY: 77 mg/dL (ref 65–99)
GLUCOSE-CAPILLARY: 114 mg/dL — AB (ref 65–99)
GLUCOSE-CAPILLARY: 110 mg/dL — AB (ref 65–99)

## 2015-07-19 LAB — VALPROIC ACID LEVEL: Valproic Acid Lvl: 77 ug/mL (ref 50.0–100.0)

## 2015-07-19 MED ORDER — SODIUM CHLORIDE 0.9 % IV SOLN: 100.0000 mL | INTRAVENOUS | Status: DC | PRN

## 2015-07-19 MED ORDER — HEPARIN SODIUM (PORCINE) 1000 UNIT/ML DIALYSIS: 1000.0000 [IU] | INTRAMUSCULAR | Status: DC | PRN

## 2015-07-19 MED ORDER — SODIUM CHLORIDE 0.9 % IV SOLN
INTRAVENOUS | Status: DC
  Administered 2015-07-19: 22:00:00 via INTRAVENOUS

## 2015-07-19 MED ORDER — PENTAFLUOROPROP-TETRAFLUOROETH EX AERO: 1.0000 "application " | INHALATION_SPRAY | CUTANEOUS | Status: DC | PRN

## 2015-07-19 MED ORDER — SODIUM CHLORIDE 0.9 % IV SOLN
50.0000 mg | Freq: Two times a day (BID) | INTRAVENOUS | Status: DC
  Administered 2015-07-19: 50 mg via INTRAVENOUS
  Filled 2015-07-19 (×10): qty 5

## 2015-07-19 MED ORDER — LIDOCAINE-PRILOCAINE 2.5-2.5 % EX CREA
1.0000 "application " | TOPICAL_CREAM | CUTANEOUS | Status: DC | PRN
  Filled 2015-07-19: qty 5

## 2015-07-19 MED ORDER — LACOSAMIDE 50 MG PO TABS
100.0000 mg | ORAL_TABLET | Freq: Once | ORAL | Status: AC
  Administered 2015-07-19: 100 mg via ORAL
  Filled 2015-07-19: qty 2

## 2015-07-19 MED ORDER — ALTEPLASE 2 MG IJ SOLR
2.0000 mg | Freq: Once | INTRAMUSCULAR | Status: DC | PRN
Start: 2015-07-19 — End: 2015-07-19
  Filled 2015-07-19: qty 2

## 2015-07-19 MED ORDER — LIDOCAINE HCL (PF) 1 % IJ SOLN: 5.0000 mL | INTRAMUSCULAR | Status: DC | PRN

## 2015-07-19 NOTE — Procedures (Signed)
Patient was seen on dialysis and the procedure was supervised. BFR 400 [address] [facility] TDC BP is 172/79.  Patient appears to be tolerating treatment well

## 2015-07-19 NOTE — Progress Notes (Signed)
Patient Name: Gail Chapman Date of Encounter: 07/19/2015  Primary Cardiologist Dr. Gala Romney    Principal Problem:   Acute encephalopathy Active Problems:   OSA (obstructive sleep apnea)   Hypertensive emergency   Pulmonary hypertension (HCC)   Chronic diastolic CHF (congestive heart failure) (HCC)   COPD (chronic obstructive pulmonary disease) (HCC)   Seizures (HCC)   Elevated troponin   MS (multiple sclerosis) (HCC)   ESRD on dialysis (HCC)   Acute on chronic diastolic heart failure (HCC)   Cerebral embolism with cerebral infarction   Cerebral thrombosis with cerebral infarction   Stroke with cerebral ischemia (HCC)    SUBJECTIVE  Husband angry about dropping her BP and blamed stroke on drop in blood pressure, says her SBP has been running 180-190s for 20 years. Patient denies any discomfort, no SOB or CP. Eating food ok.   CURRENT MEDS . antiseptic oral rinse  7 mL Mouth Rinse BID  . aspirin EC  325 mg Oral Daily  . calcitRIOL  0.25 mcg Oral Q M,W,F-HD  . carvedilol  12.5 mg Oral BID WC  . darbepoetin (ARANESP) injection - DIALYSIS  60 mcg Intravenous Once  . doxazosin  2 mg Oral Daily  . heparin  5,000 Units Subcutaneous 3 times per day  . insulin aspart  0-9 Units Subcutaneous TID WC  . insulin glargine  3 Units Subcutaneous Daily  . pantoprazole (PROTONIX) IV  40 mg Intravenous Q24H  . pravastatin  40 mg Oral q1800  . regadenoson  0.4 mg Intravenous Once  . sevelamer carbonate  800 mg Oral TID WC  . sodium chloride  3 mL Intravenous Q12H  . valproate sodium  500 mg Intravenous Q12H    OBJECTIVE  Filed Vitals:   07/19/15 0930 07/19/15 1000 07/19/15 1030 07/19/15 1055  BP: 172/79 200/91 190/80 188/80  Pulse: 69 70 70 71  Temp:    98.2 F (36.8 C)  TempSrc:      Resp:    20  Height:      Weight:      SpO2:    100%    Intake/Output Summary (Last 24 hours) at 07/19/15 1229 Last data filed at 07/19/15 1055  Gross per 24 hour  Intake    590 ml    Output    100 ml  Net    490 ml   Filed Weights   07/18/15 0325 07/18/15 2118 07/19/15 0648  Weight: 144 lb (65.318 kg) 149 lb (67.586 kg) 148 lb 13 oz (67.5 kg)    PHYSICAL EXAM  General: Pleasant, NAD. Neuro: Alert and oriented X 3. Moves all extremities spontaneously. Psych: Normal affect. HEENT:  Normal  Neck: Supple without bruits or JVD. Lungs:  Resp regular and unlabored, CTA. Heart: RRR no s3, s4, or murmurs. Abdomen: Soft, non-tender, non-distended, BS + x 4.  Extremities: No clubbing, cyanosis or edema. 2+ DP pulse on L and did not appreciated DP pulse on R.  Accessory Clinical Findings  CBC  Recent Labs  07/19/15 0508 07/19/15 0650  WBC 5.5 6.2  HGB 9.7* 9.3*  HCT 31.5* 29.7*  MCV 89.0 89.2  PLT 174 170   Basic Metabolic Panel  Recent Labs  07/18/15 0133 07/19/15 0508 07/19/15 0650  NA 134* 134* 133*  K 4.7 5.2* 5.3*  CL 98* 96* 97*  CO2 23 24 24   GLUCOSE 105* 91 95  BUN 35* 57* 59*  CREATININE 5.25* 6.60* 6.50*  CALCIUM 8.5* 8.4* 8.3*  PHOS 6.5*  --  7.4*   Liver Function Tests  Recent Labs  07/18/15 0133 07/19/15 0650  ALBUMIN 2.7* 2.7*    TELE NSR with HR 70s    ECG  No new EKG  Echocardiogram 07/13/2015  LV EF: 50% -  55%  ------------------------------------------------------------------- Indications:   Chest pain 786.51.  ------------------------------------------------------------------- History:  PMH: Elevated troponin. ESRD. Pericardial effusion. Congestive heart failure. Risk factors: Hypertension. Diabetes mellitus.  ------------------------------------------------------------------- Study Conclusions  - Left ventricle: The cavity size was normal. There was severe concentric hypertrophy. Systolic function was normal. The estimated ejection fraction was in the range of 50% to 55%. There is akinesis of the basal-midinferior myocardium. Doppler parameters are consistent with a reversible  restrictive pattern, indicative of decreased left ventricular diastolic compliance and/or increased left atrial pressure (grade 3 diastolic dysfunction). - Mitral valve: Severely calcified annulus. There was moderate regurgitation. - Left atrium: The atrium was moderately dilated. - Right ventricle: The cavity size was mildly dilated. Wall thickness was normal. - Pulmonary arteries: Systolic pressure was mildly increased. PA peak pressure: 43 mm Hg (S). - Pericardium, extracardiac: A small to moderate pericardial effusion was identified posterior to the heart (1.5cm). Features were not consistent with tamponade physiology.    Radiology/Studies  Ct Head Wo Contrast  07/14/2015   CLINICAL DATA:  Acute onset confusion and lethargy after dialysis. Chronic renal failure.  EXAM: CT HEAD WITHOUT CONTRAST  TECHNIQUE: Contiguous axial images were obtained from the base of the skull through the vertex without intravenous contrast.  COMPARISON:  None.  FINDINGS: Moderate diffuse atrophy is present. There is no intracranial mass, hemorrhage, extra-axial fluid collection, midline shift. There is small vessel disease throughout the centra semiovale bilaterally. There are age uncertain fairly small infarcts in the anterior left occipital and anterior superior left frontal lobes. There is a small age uncertain infarct in the superior posterior right cerebellum. There is also a small age uncertain infarct in the posterior right temporal lobe lateral and slightly posterior to the temporal horn the right lateral ventricle. There is a small age uncertain infarct in the right lentiform nucleus. Bony calvarium appears intact. The mastoid air cells are clear. There is mild mucosal thickening of several ethmoid air cells bilaterally.  IMPRESSION: Atrophy with extensive periventricular small vessel disease. Age uncertain small infarcts at several sites as described. No hemorrhage or mass effect. Mild  ethmoid sinus disease bilaterally.   Electronically Signed   By: Bretta Bang III M.D.   On: 07/14/2015 14:25   Mr Brain Wo Contrast  07/15/2015   CLINICAL DATA:  Stroke with cerebral ischemia  EXAM: MRI HEAD WITHOUT CONTRAST  TECHNIQUE: Multiplanar, multiecho pulse sequences of the brain and surrounding structures were obtained without intravenous contrast.  COMPARISON:  CT head 07/14/2015  FINDINGS: Multiple areas of restricted diffusion bilaterally compatible with acute infarction. Acute infarct in the occipital lobes bilaterally left greater than right. The left-sided stroke measures up to 11 mm. Small area of acute infarct in the right frontal operculum and in the left frontal white matter.  Moderate to advanced chronic microvascular ischemic changes in the white matter bilaterally. Chronic infarcts in the thalamus bilaterally.  Chronic blood products in the left temporal lobe and in the pontomedullary junction compatible with prior hemorrhage. This is likely related to poorly controlled hypertension.  Moderate atrophy. Ventricular enlargement consistent with atrophy. No hydrocephalus. Negative for mass lesion.  Normal orbit.  Paranasal sinuses clear.  Image quality degraded by motion  IMPRESSION: Atrophy and moderate to advanced chronic ischemia.  Chronic hemorrhage in the brainstem and left temporal lobe due to chronic hypertension  Acute infarcts in the occipital lobe and frontal lobes bilaterally.   Electronically Signed   By: Marlan Palau M.D.   On: 07/15/2015 14:45   Dg Chest Port 1 View  07/15/2015   CLINICAL DATA:  E86.9 (ICD-10-CM) - Volume depletion  EXAM: PORTABLE CHEST 1 VIEW  COMPARISON:  08/28/2014  FINDINGS: Left-sided dialysis catheter tip to the upper right atrium. Cardiopericardial silhouette is enlarged and stable. Lungs are clear. No pulmonary edema.  IMPRESSION: No significant change in marked enlargement of the cardiopericardial silhouette.   Electronically Signed   By:  Norva Pavlov M.D.   On: 07/15/2015 10:20    ASSESSMENT AND PLAN  Gail Chapman is a 59 y.o. female with a history of severe HTN, DM, chronic diastolic CHF, pulmonary HTN, pericardial effusion and ESRD on HD who was admitted from [facility] for serizure. Cardiology consulted for elevated trop 0.07--> 0.06. 2D echo showed basal-midinferior myocardial wall motion abnormality. Initial CT of head negative. However MRI of brain showed acute infarcts in the occipital and front lobes.   1. CVA  - concern for possible cardioembolic source. Carotid U/S no significant blockage.  - no detected afib to this point, continue telemetry.   - she is on ASA 325, statin for secondary prevention. No ACE-I given AKI  - will arrange TEE tomorrow. Also will discuss with MD regarding outpatient monitor vs loop recorder, favor loop recorder if TEE negative.   - discussed risk and benefit of TEE include risk associated with sedation (hypotension, bradycardia, and aspiration event) and physical trauma from U/S probe (esophageal bleeding, hematoma or even rupture in worst case). Overall risk 1:10000 cases. Family display clear understanding and agree to proceed.   2. Elevated troponin  - flat mild elevation to 0.06. Chronic lateral and inferior ST/T changes, no new ischemic changes  - echo LVEF 50-55%, akinesis of the basal-midinferior wall, restrictive diastolic function, moderate MR, PASP 43, moderate pericardial effusion.  - do not suspect ACS, probable demand ischemia in setting of CVA, HTN, and seizures  - does have WMA on echo, Dr Royann Shivers had ordered lexiscan but has been postponed due to her ongoing medical illness.   3. Pericardial effusion  - chronic, small compared to 04/2015 study  - no evidence of tamponade physiology  4. Seizures  - per neurology  5. HTN  - bp targets per neurology in setting of CVA and accepted permissive HTN.   - SBP 140-200s, per husband, her SBP has been running 180-190  for 20 yrs  Signed, Azalee Course PA-C Pager: [phone]

## 2015-07-19 NOTE — Progress Notes (Signed)
EEG completed, results pending. 

## 2015-07-19 NOTE — Progress Notes (Addendum)
[facility] Progress Note   Cambree Hendrix Bollard  ZOX:[id number]  DOB: 03-26-1956  DOA: 07/12/2015 PCP: No PCP Per Patient  Brief narrative: Gail Chapman is a 59 y.o. female with chronic bilateral lower extremity weakness who is wheelchair bound, hypertension, hyperlipidemia, diabetes mellitus, diastolic heart failure and pulmonary hypertension, COPD on 3 L of oxygen, end-stage renal disease on hemodialysis and obstructive sleep apnea.  The patient presented to the hospital with confusion and dizziness and a witnessed seizure. Seizure episode lasted for about 2-3 minutes. She was initially brought into [facility] ER and noted to have hypertensive urgency with a blood pressure of 260/130 and a troponin of 0.46. Cardizem infusion was started in the ER. She had admitted to missing hemodialysis on that day.  Initial CT scan of the head showed age indeterminate small infarcts. An MRI revealed chronic hemorrhage in the brainstem and left temporal lobe secondary to chronic hypertension but also acute infarcts in the occipital lobe and frontal lobes bilaterally. She was transferred to [facility] and evaluated by neurology.   Subjective: Evaluated on while on dialysis. She has no complaints. Specifically no cough, dyspnea, chest pain, nausea, vomiting or diarrhea.   Assessment/Plan: Principal Problem:   Acute encephalopathy -Suspected to be secondary to accelerated hypertension, CVA and post ictal state -Still appears to be quite slow to respond to questions and intermittently is having difficulty with following commands  Active Problems:   CVA - b/l frontal and occipital infarcts -Neurology suspects a cardioembolic source vs due to hypotension -  lower extremity duplex negative for DVT -Carotid duplex does not reveal any significant stenosis -Transcranial Doppler consistent with diffuse intracranial atherosclerosis  -Transthoracic echo did not reveal a thrombus-TEE  pending --Continue full dose aspirin for now -PT recommended skilled nursing facility  Seizure -Neurology has recommended Depakote - EEG reveals an epileptogenic potential - repeat EEG done today- report pending  End-stage renal disease on hemodialysis -Dialysis Monday Wednesday Friday-nephrology following  Accelerated hypertension - long h/o uncontrolled BP -improving with adjustment of antihypertensives  -cont Cardura and carvedilol - hydralazine, labetalol and clonidine mentioned as allergies  Insulin-dependent diabetes mellitus -Continue Levemir and sliding scale insulin  Mild troponin elevation/ focal akinesis on ECHO  - see ECHO report below -Evaluated by cardiology - troponin elevated suspected to be demand ischemia - further work up for focal hypokinesis, per cardiology, to be done at a later date pending completion of neuro w/u   Mod Pericardial effusion - chronic- no tamponade  Generalized weakness PT recommended skilled nursing facility-  Code Status:     Code Status Orders        Start     Ordered   07/12/15 2046  Full code   Continuous     07/12/15 2047     Family Communication: With husband  Disposition Plan:   skilled nursing facility  DVT prophylaxis: Heparin  Consultants:Neurology, nephrology  Procedures: EEG 10/4 Carotid duplex 2-D echo - ejection fraction was in the range of 50% to 55%. - reversible restrictive pattern, (grade 3 diastolic dysfunction) - akinesis of the basal-midinferior myocardium - Mitral valve: Severely calcified annulus. There was moderate regurgitation. - Left atrium: The atrium was moderately dilated. - Right ventricle: The cavity size was mildly dilated. Wall thickness was normal. - Pulmonary arteries: Systolic pressure was mildly increased. PA peak pressure: 43 mm Hg (S). - Pericardium, extracardiac: A small to moderate pericardial effusion was identified posterior to the heart (1.5cm).  Features were not consistent  with tamponade physiology.  Antibiotics: Anti-infectives    None      Objective: Filed Weights   07/18/15 0325 07/18/15 2118 07/19/15 0648  Weight: 65.318 kg (144 lb) 67.586 kg (149 lb) 67.5 kg (148 lb 13 oz)    Intake/Output Summary (Last 24 hours) at 07/19/15 1358 Last data filed at 07/19/15 1055  Gross per 24 hour  Intake    350 ml  Output    100 ml  Net    250 ml     Vitals Filed Vitals:   07/19/15 0930 07/19/15 1000 07/19/15 1030 07/19/15 1055  BP: 172/79 200/91 190/80 188/80  Pulse: 69 70 70 71  Temp:    98.2 F (36.8 C)  TempSrc:      Resp:    20  Height:      Weight:      SpO2:    100%    Exam:  General:  Pt is alert, not in acute distress  HEENT: No icterus, No thrush, oral mucosa moist  Cardiovascular: regular rate and rhythm, S1/S2 No murmur  Respiratory: clear to auscultation bilaterally   Abdomen: Soft, +Bowel sounds, non tender, non distended, no guarding  MSK: No LE edema, cyanosis or clubbing  Neuro: psychomotor slowing noted; Left arm and leg 4 out of 5 in strength-right arm and leg 5 over 5-cranial nerves II through XII intact  Data Reviewed: Basic Metabolic Panel:  Recent Labs Lab 07/16/15 0241 07/17/15 0224 07/18/15 0133 07/19/15 0508 07/19/15 0650  NA 134* 133* 134* 134* 133*  K 5.0 4.3 4.7 5.2* 5.3*  CL 98* 99* 98* 96* 97*  CO2 GLUCOSE 77 87 105* 91 95  BUN 43* 21* 35* 57* 59*  CREATININE 6.01* 3.85* 5.25* 6.60* 6.50*  CALCIUM 8.2* 8.5* 8.5* 8.4* 8.3*  PHOS  --   --  6.5*  --  7.4*   Liver Function Tests:  Recent Labs Lab 07/13/15 0344 07/18/15 0133 07/19/15 0650  AST 13*  --   --   ALT 10*  --   --   ALKPHOS 47  --   --   BILITOT 0.6  --   --   PROT 6.8  --   --   ALBUMIN 3.2* 2.7* 2.7*   No results for input(s): LIPASE, AMYLASE in the last 168 hours. No results for input(s): AMMONIA in the last 168 hours. CBC:  Recent Labs Lab 07/16/15 0241 07/17/15 0224  07/18/15 0133 07/19/15 0508 07/19/15 0650  WBC 10.2 9.1 7.4 5.5 6.2  HGB 10.1* 9.6* 9.6* 9.7* 9.3*  HCT 32.2* 29.9* 30.5* 31.5* 29.7*  MCV 90.7 89.5 89.2 89.0 89.2  PLT 147* 157 160 174 170   Cardiac Enzymes:  Recent Labs Lab 07/13/15 1128 07/13/15 1610 07/15/15 0834 07/15/15 1638 07/15/15 1942  TROPONINI 0.06* 0.05* 0.06* 0.06* 0.05*   BNP (last 3 results)  Recent Labs  07/13/15 0344  BNP >4500.0*    ProBNP (last 3 results) No results for input(s): PROBNP in the last 8760 hours.  CBG:  Recent Labs Lab 07/18/15 0739 07/18/15 1123 07/18/15 1652 07/18/15 2111 07/19/15 1119  GLUCAP 75 102* 96 117* 77    Recent Results (from the past 240 hour(s))  MRSA PCR Screening     Status: None   Collection Time: 07/12/15  7:40 PM  Result Value Ref Range Status   MRSA by PCR NEGATIVE NEGATIVE Final    Comment:        The GeneXpert  MRSA Assay (FDA approved for NASAL specimens only), is one component of a comprehensive MRSA colonization surveillance program. It is not intended to diagnose MRSA infection nor to guide or monitor treatment for MRSA infections.   MRSA PCR Screening     Status: None   Collection Time: 07/14/15  3:05 PM  Result Value Ref Range Status   MRSA by PCR NEGATIVE NEGATIVE Final    Comment:        The GeneXpert MRSA Assay (FDA approved for NASAL specimens only), is one component of a comprehensive MRSA colonization surveillance program. It is not intended to diagnose MRSA infection nor to guide or monitor treatment for MRSA infections.      Studies: No results found.  Scheduled Meds:  Scheduled Meds: . antiseptic oral rinse  7 mL Mouth Rinse BID  . aspirin EC  325 mg Oral Daily  . calcitRIOL  0.25 mcg Oral Q M,W,F-HD  . carvedilol  12.5 mg Oral BID WC  . darbepoetin (ARANESP) injection - DIALYSIS  60 mcg Intravenous Once  . doxazosin  2 mg Oral Daily  . heparin  5,000 Units Subcutaneous 3 times per day  . insulin aspart  0-9  Units Subcutaneous TID WC  . insulin glargine  3 Units Subcutaneous Daily  . pantoprazole (PROTONIX) IV  40 mg Intravenous Q24H  . pravastatin  40 mg Oral q1800  . regadenoson  0.4 mg Intravenous Once  . sevelamer carbonate  800 mg Oral TID WC  . sodium chloride  3 mL Intravenous Q12H  . valproate sodium  500 mg Intravenous Q12H   Continuous Infusions:    Time spent on care of this patient: 35 min   Fleur Audino, MD 07/19/2015, 1:58 PM  LOS: 7 days   [facility] Office  [phone] Pager - Text Page per [email or web address] If 7PM-7AM, please contact night-coverage [email or web address]

## 2015-07-19 NOTE — Progress Notes (Signed)
STROKE TEAM PROGRESS NOTE   SUBJECTIVE (INTERVAL HISTORY) Pt had HD today. Repeat EEG showed improvement on frontal sharps but continued parietooccipital sharps with cerebral dysfunction. Will add vimpat on top of depakote. Check ammonia level. Depakote level 77 this am.    OBJECTIVE Temp:  [98 F (36.7 C)-98.9 F (37.2 C)] 98.4 F (36.9 C) (10/10 0648) Pulse Rate:  [68-73] 70 (10/10 1030) Cardiac Rhythm:  [-] Normal sinus rhythm (10/10 0700) Resp:  [18-20] 20 (10/10 0452) BP: (129-220)/(55-91) 190/80 mmHg (10/10 1030) SpO2:  [100 %] 100 % (10/10 0648) Weight:  [67.5 kg (148 lb 13 oz)-67.586 kg (149 lb)] 67.5 kg (148 lb 13 oz) (10/10 0648)  CBC:   Recent Labs Lab 07/19/15 0508 07/19/15 0650  WBC 5.5 6.2  HGB 9.7* 9.3*  HCT 31.5* 29.7*  MCV 89.0 89.2  PLT 174 170    Basic Metabolic Panel:   Recent Labs Lab 07/18/15 0133 07/19/15 0508 07/19/15 0650  NA 134* 134* 133*  K 4.7 5.2* 5.3*  CL 98* 96* 97*  CO2 GLUCOSE 105* 91 95  BUN 35* 57* 59*  CREATININE 5.25* 6.60* 6.50*  CALCIUM 8.5* 8.4* 8.3*  PHOS 6.5*  --  7.4*    Lipid Panel:     Component Value Date/Time   CHOL 171 07/13/2015 0344   TRIG 54 07/13/2015 0344   HDL 67 07/13/2015 0344   CHOLHDL 2.6 07/13/2015 0344   VLDL 11 07/13/2015 0344   LDLCALC 93 07/13/2015 0344   HgbA1c:  Lab Results  Component Value Date   HGBA1C 5.9* 07/13/2015   Urine Drug Screen: No results found for: LABOPIA, COCAINSCRNUR, LABBENZ, AMPHETMU, THCU, LABBARB    IMAGING I have personally reviewed the radiological images below and agree with the radiology interpretations.  Ct Head Wo Contrast 07/14/2015    Atrophy with extensive periventricular small vessel disease. Age uncertain small infarcts at several sites as described. No hemorrhage or mass effect. Mild ethmoid sinus disease bilaterally.     Mr Brain Wo Contrast 07/15/2015    Atrophy and moderate to advanced chronic ischemia. Chronic hemorrhage in the  brainstem and left temporal lobe due to chronic hypertension  Acute infarcts in the occipital lobe and frontal lobes bilaterally.     2D Echocardiogram   - Left ventricle: The cavity size was normal. There was severeconcentric hypertrophy. Systolic function was normal. Theestimated ejection fraction was in the range of 50% to 55%. Thereis akinesis of the basal-midinferior myocardium. Dopplerparameters are consistent with a reversible restrictive pattern,indicative of decreased left ventricular diastolic complianceand/or increased left atrial pressure (grade 3 diastolicdysfunction). - Mitral valve: Severely calcified annulus. There was moderateregurgitation. - Left atrium: The atrium was moderately dilated. - Right ventricle: The cavity size was mildly dilated. Wallthickness was normal. - Pulmonary arteries: Systolic pressure was mildly increased. PApeak pressure: 43 mm Hg (S). - Pericardium, extracardiac: A small to moderate pericardialeffusion was identified posterior to the heart (1.5cm). Featureswere not consistent with tamponade physiology.  EEG 07/13/15 This is an abnormal EEG secondary to focal sharp wave activity in the frontal region on the left with phase reversal at F7. This finding is suggestive of a focal disturbance with epileptogenic potential.   EEG 07/14/15 This is an abnormal EEG secondary to focal sharp wave activity in the frontal regions bilaterally (left more than right) with phase reversal at [facility] most predominantly. This finding is suggestive of a focal disturbance with epileptogenic potential.  EEG 07/19/15 - 1) bilateral independent parietooccipital sharp  waves. 2) Generalized irregular delta activity occurring in bursts. Clinical Interpretation: This EEG is consistent with bilateral areas of potential epileptogenicity in the parietooccipital regions in the setting of a generalized non-specific cerebral dysfunction(encephalopathy). In comparison to the previous  study, there has been improvement in the frontotemporal sharp waves, but persistence of the parieto-occipital sharps.  Carotid US - Bilateral: 1-39% ICA stenosis. Vertebral artery flow is antegrade.  LE venous doppler - no DVT  TCD Low normal mean flow velocities in majority of identified vessels of anterior and posterior circulation due to suboptimal windows and suspect diffuse intracranial atherosclerosis given globally elevated pulsatility indexes.   PHYSICAL EXAM Middle-aged obese African-American lady not in distress. Sitting in chair, mildly restless. Afebrile. Head is nontraumatic. Neck is supple without bruit.  Cardiac exam no murmur or gallop. Lungs are clear to auscultation. Distal pulses are well felt.   Neurological Exam :  AAOx2, not orientated to time. Fund of knowledge impaired not knowing current president. Diminished attention, registration and recall. No aphasia or apraxia dysarthria, able to name and repeat. But psychomotor slowing. Eye movements are full range without nystagmus. Blinks to threat bilaterally. Fundi were not visualized. Vision acuity and fields seem adequate. Face is symmetric without weakness. Tongue is midline. Motor system exam reveals no upper or lower extremity drift. Sensation seems preserved bilaterally. Coordination is slow but accurate. Gait was not tested. Deep tendon reflexes are symmetric. Plantars are downgoing.    ASSESSMENT/PLAN [name] is a 59 y.o. female with history of MS, chronic bilateral leg weakness (using wheelchair), hypertension, hyperlipidemia, diabetes mellitus, diastolic congestive heart failure, pulmonary hypertension, OSA on CPAP, COPD on 3 L oxygen at home, and end-stage renal disease-HD (MWF), who was transferred from [place] with altered mental status, dizziness, seizure. MRI found stroke during admission.   Stroke: occipital lobe and bilateral frontal lobe small infarcts, embolic pattern secondary to unknown  source, hypoperfusion due to hypotension vs. Afib. Pt had hypotensive episodes correlating the time period of infarct. Most of punctate infarct support watershed distribution except one in the right MCA territory.   Resultant no significant focal deficit, but psychomotor slowing  MRI  occipital lobe and bilateral frontal lobe small infarcts  TCD diffuse athero  Carotid Doppler  1-39% bilateral stenosis  2D Echo  No source of embolus    LE venous doppler negative for DVT  recommend TEE to look for embolic source. Cardiology also recommend loop recorder if TEE  unrevealing   Heparin 5000 units sq tid for VTE prophylaxis DIET DYS 3 Room service appropriate?: Yes; Fluid consistency:: Thin  aspirin 325 mg orally every day prior to admission, now on aspirin 325 mg orally every day.   Ongoing aggressive stroke risk factor management  Therapy recommendations:  pending  Disposition:  pending  Seizure  On depakote  EEG positive 10/4 & 10/5  EEG 10/10 showed  improvement on frontal sharps but continued parietooccipital sharps with cerebral dysfunction.  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  Obstructive sleep apnea, on CPAP at home, 3L O2  Other Active Problems  ESRD on HD MWF  Anemia of chronic disease  Metabolic bone disease  COPD  Chronic diastolic heart failure  Multiple sclerosis w/ chronic B leg weakness, uses w/c  Pulmonary  hypertension   Hospital day # 7  Marvel Plan, MD PhD Stroke Neurology 07/19/2015 6:08 PM   To contact Stroke Continuity provider, please refer to [email or web address]. After hours, contact General Neurology

## 2015-07-19 NOTE — Progress Notes (Signed)
Pt NPO after midnight for TEE.

## 2015-07-19 NOTE — Progress Notes (Signed)
SLP Cancellation Note  Patient Details Name: Gail Chapman MRN: [id number] DOB: [birth year]   Cancelled treatment:       Reason Eval/Treat Not Completed: Patient at procedure or test/unavailable (HD)   Maxcine Ham, M.A. CCC-SLP [phone]  Maxcine Ham 07/19/2015, 11:04 AM

## 2015-07-19 NOTE — Procedures (Signed)
History: 59 yo F with seizures who was then found to have bilateral strokes with persistent encephalopathy  Sedation: None  Technique: This is a 19 channel routine scalp EEG performed at the bedside with bipolar and monopolar montages arranged in accordance to the international 10/20 system of electrode placement. One channel was dedicated to EKG recording.   Background: The background consists generally of fairly well organized alpha and beta activities with posterior dominant rhythm of 9 Hz. This is interspersed with frequent bursts of rhythmic delta activity with frontal predominance. There are bilateral independent parietooccipital sharp waves(P4, P8, O2) and (P3, P7, O1) seen at times, most commonly during the periods of delta activity. There is no evolution to suggest an ictal nature to this activity.   Photic stimulation: Physiologic driving is not performed  EEG Abnormalities: 1) bilateral independent parietooccipital sharp waves.  2) Generalized irregular delta activity occurring in bursts.   Clinical Interpretation: This EEG is consistent with bilateral areas of potential epileptogenicity in the parietooccipital regions in the setting of a generalized non-specific cerebral dysfunction(encephalopathy).  In comparison to the previous study, there has been improvement in the frontotemporal sharp waves, but persistence of the parieto-occipital sharps.  Gail Slot, MD [facility] [phone]  If 7pm- 7am, please page neurology on call as listed in AMION.

## 2015-07-20 ENCOUNTER — Other Ambulatory Visit (HOSPITAL_COMMUNITY): Payer: BC Managed Care – PPO

## 2015-07-20 ENCOUNTER — Encounter (HOSPITAL_COMMUNITY)
Admission: AD | Disposition: A | Discharge status: Rehabilitation Facility | Payer: Self-pay | Source: Other Acute Inpatient Hospital | Attending: Internal Medicine

## 2015-07-20 LAB — BASIC METABOLIC PANEL
ANION GAP: 10 (ref 5–15)
BUN: 24 mg/dL — ABNORMAL HIGH (ref 6–20)
CALCIUM: 8.4 mg/dL — AB (ref 8.9–10.3)
CHLORIDE: 97 mmol/L — AB (ref 101–111)
CO2: 28 mmol/L (ref 22–32)
Creatinine, Ser: 3.89 mg/dL — ABNORMAL HIGH (ref 0.44–1.00)
GFR calc Af Amer: 14 mL/min — ABNORMAL LOW (ref >60)
GFR calc non Af Amer: 12 mL/min — ABNORMAL LOW (ref >60)
GLUCOSE: 85 mg/dL (ref 65–99)
Potassium: 4.2 mmol/L (ref 3.5–5.1)
Sodium: 135 mmol/L (ref 135–145)

## 2015-07-20 LAB — CBC
HEMATOCRIT: 31.6 % — AB (ref 36.0–46.0)
Hemoglobin: 9.6 g/dL — ABNORMAL LOW (ref 12.0–15.0)
MCH: 27.3 pg (ref 26.0–34.0)
MCHC: 30.4 g/dL (ref 30.0–36.0)
MCV: 89.8 fL (ref 78.0–100.0)
Platelets: 173 10*3/uL (ref 150–400)
RBC: 3.52 MIL/uL — ABNORMAL LOW (ref 3.87–5.11)
RDW: 15.6 % — AB (ref 11.5–15.5)
WBC: 5.3 10*3/uL (ref 4.0–10.5)

## 2015-07-20 LAB — GLUCOSE, CAPILLARY
GLUCOSE-CAPILLARY: 85 mg/dL (ref 65–99)
Glucose-Capillary: 98 mg/dL (ref 65–99)
Glucose-Capillary: 81 mg/dL (ref 65–99)
Glucose-Capillary: 83 mg/dL (ref 65–99)

## 2015-07-20 LAB — AMMONIA: AMMONIA: 33 umol/L (ref 9–35)

## 2015-07-20 LAB — VALPROIC ACID LEVEL: Valproic Acid Lvl: 58 ug/mL (ref 50.0–100.0)

## 2015-07-20 SURGERY — ECHOCARDIOGRAM, TRANSESOPHAGEAL
Anesthesia: Monitor Anesthesia Care

## 2015-07-20 MED ORDER — BISACODYL 10 MG RE SUPP
10.0000 mg | Freq: Once | RECTAL | Status: AC
  Administered 2015-07-20: 10 mg via RECTAL
  Filled 2015-07-20: qty 1

## 2015-07-20 MED ORDER — VALPROATE SODIUM 500 MG/5ML IV SOLN
750.0000 mg | Freq: Two times a day (BID) | INTRAVENOUS | Status: DC
  Administered 2015-07-20 – 2015-07-21 (×2): 750 mg via INTRAVENOUS
  Filled 2015-07-20 (×3): qty 7.5

## 2015-07-20 MED ORDER — DARBEPOETIN ALFA 60 MCG/0.3ML IJ SOSY
60.0000 ug | PREFILLED_SYRINGE | INTRAMUSCULAR | Status: DC
  Filled 2015-07-20: qty 0.3

## 2015-07-20 NOTE — Progress Notes (Signed)
Pts family refused vimpat this dose due to lethargy.  Pts husband concerned pt is progressively getting more lethargic since transfer from ICU. Paged Dr. Lebron Quam.

## 2015-07-20 NOTE — Progress Notes (Signed)
Speech Language Pathology Treatment: Dysphagia  Patient Details Name: Gail Chapman MRN: [id number] DOB: [birth year] Today's Date: 07/20/2015 Time: 1540-1600 SLP Time Calculation (min) (ACUTE ONLY): 20 min  Assessment / Plan / Recommendation Clinical Impression  Pt sitting recliner; lethargic, but responsive to questions and consumed limited POs with min cues for self-feeding/safety. No s/s of aspiration.  Family states she has had minimal intake today.   Pt with delayed responses, decreased initiation, and perseverative verbal output.  Speech is clear.  Given neuro diagnosis and presence of new cognitive deficits, pt will benefit from SLP cognitive-linguistic evaluation - please order.   HPI Other Pertinent Information: 59 y.o. female with PMH of MS, chronic bilateral leg weakness (using wheelchair), hypertension, hyperlipidemia, diabetes mellitus, diastolic congestive heart failure, pulmonary hypertension, OSA on CPAP, COPD on 3 L oxygen at home, and end-stage renal disease-HD (MWF), who presents with altered mental status, dizziness, seizure.MRI on 10/6 revealed acute infarcts in occipital love and frontal lobes bilaterally as well as chronic hemorrhage in brainstem and L temporal lobe due to chronic HTN.    Pertinent Vitals Pain Assessment: No/denies pain Faces Pain Scale: No hurt  SLP Plan  Continue with current plan of care    Recommendations Diet recommendations: Dysphagia 3 (mechanical soft);Thin liquid Liquids provided via: Cup;Straw Medication Administration: Whole meds with puree Supervision: Staff to assist with self feeding Compensations: Slow rate;Small sips/bites Postural Changes and/or Swallow Maneuvers: Seated upright 90 degrees              Oral Care Recommendations: Oral care BID Plan: Continue with current plan of care    GO     [name] [name] 07/20/2015, 4:02 PM

## 2015-07-20 NOTE — Progress Notes (Signed)
Subjective: Pt lethargic, she has been with vomiting today and TEE cancelled.   Objective: Vital signs in last 24 hours: Temp:  [98.9 F (37.2 C)-100.2 F (37.9 C)] 99 F (37.2 C) (10/11 1000) Pulse Rate:  [68-78] 69 (10/11 1000) Resp:  [18] 18 (10/11 0817) BP: (143-189)/(54-86) 184/74 mmHg (10/11 1000) SpO2:  [89 %-100 %] 100 % (10/11 1000) Weight change:  Last BM Date: 07/13/15 Intake/Output from previous day:  10/10 0701 - 10/11 0700 In: 652.7 [P.O.:480; I.V.:172.7] Out: 100  Intake/Output this shift:    PE: General:sleeping, wakes when name called but quickly drifts back to sleep Skin:Warm and dry, brisk capillary refill HEENT:normocephalic, sclera clear, mucus membranes moist Heart:S1S2 RRR without murmur, gallup, rub or click Lungs:clear without rales, rhonchi, or wheezes VOH:YWVP, non tender, + BS, do not palpate liver spleen or masses Ext:no lower ext edema, 2+ radial pulses Neuro:alert and oriented X 3, MAE, follows commands, + facial symmetry Tele:  SR no arrhthymias    Lab Results:  Recent Labs  07/19/15 0650 07/20/15 0615  WBC 6.2 5.3  HGB 9.3* 9.6*  HCT 29.7* 31.6*  PLT 170 173   BMET  Recent Labs  07/19/15 0650 07/20/15 0615  NA 133* 135  K 5.3* 4.2  CL 97* 97*  CO2 24 28  GLUCOSE 95 85  BUN 59* 24*  CREATININE 6.50* 3.89*  CALCIUM 8.3* 8.4*   No results for input(s): TROPONINI in the last 72 hours.  Invalid input(s): CK, MB  Lab Results  Component Value Date   CHOL 171 07/13/2015   HDL 67 07/13/2015   LDLCALC 93 07/13/2015   TRIG 54 07/13/2015   CHOLHDL 2.6 07/13/2015   Lab Results  Component Value Date   HGBA1C 5.9* 07/13/2015     Lab Results  Component Value Date   TSH 1.681 02/19/2013    Hepatic Function Panel  Recent Labs  07/19/15 0650  ALBUMIN 2.7*   No results for input(s): CHOL in the last 72 hours. No results for input(s): PROTIME in the last 72 hours.     Studies/Results: No results  found.  Medications: I have reviewed the patient's current medications. Scheduled Meds: . antiseptic oral rinse  7 mL Mouth Rinse BID  . aspirin EC  325 mg Oral Daily  . calcitRIOL  0.25 mcg Oral Q M,W,F-HD  . carvedilol  12.5 mg Oral BID WC  . darbepoetin (ARANESP) injection - DIALYSIS  60 mcg Intravenous Once  . doxazosin  2 mg Oral Daily  . heparin  5,000 Units Subcutaneous 3 times per day  . insulin aspart  0-9 Units Subcutaneous TID WC  . insulin glargine  3 Units Subcutaneous Daily  . lacosamide (VIMPAT) IV  50 mg Intravenous Q12H  . pantoprazole (PROTONIX) IV  40 mg Intravenous Q24H  . pravastatin  40 mg Oral q1800  . regadenoson  0.4 mg Intravenous Once  . sevelamer carbonate  800 mg Oral TID WC  . sodium chloride  3 mL Intravenous Q12H  . valproate sodium  750 mg Intravenous Q12H   Continuous Infusions: . sodium chloride 20 mL/hr at 07/20/15 0600   PRN Meds:.acetaminophen **OR** acetaminophen, LORazepam, metoprolol, ondansetron **OR** ondansetron (ZOFRAN) IV  Assessment/Plan: Principal Problem:   Acute encephalopathy Active Problems:   OSA (obstructive sleep apnea)   Hypertensive emergency   Pulmonary hypertension (HCC)   Chronic diastolic CHF (congestive heart failure) (HCC)   COPD (chronic obstructive pulmonary disease) (HCC)  Seizures (HCC)   Elevated troponin   MS (multiple sclerosis) (HCC)   ESRD on dialysis (HCC)   Acute on chronic diastolic heart failure (HCC)   Cerebral embolism with cerebral infarction   Cerebral thrombosis with cerebral infarction   Stroke with cerebral ischemia (HCC)  Gail Chapman is a 59 y.o. female with a history of severe HTN, DM, chronic diastolic CHF, pulmonary HTN, pericardial effusion and ESRD on HD who was admitted from [facility] for serizure. Cardiology consulted for elevated trop 0.07--> 0.06. 2D echo showed basal-midinferior myocardial wall motion abnormality. Initial CT of head negative. However MRI of brain  showed acute infarcts in the occipital and front lobes.   1. CVA - concern for possible cardioembolic source. Carotid U/S no significant blockage. - no detected afib to this point, continue telemetry.  - she is on ASA 325, statin for secondary prevention. No ACE-I given AKI - TEE cancelled due to nausea and vomiting.  loop recorder was to be planned if TEE negative.  ---if rescheduled will need with with anesthesia.  Has become more sedate- her husband stated she takes her cardura at night because of becoming sleepy with it.  Now given in the day.   Husband frustrated about not knowing cause of the stoke.     2. Elevated troponin - flat mild elevation to 0.06. Chronic lateral and inferior ST/T changes, no new ischemic changes - echo LVEF 50-55%, akinesis of the basal-midinferior wall, restrictive diastolic function, moderate MR, PASP 43, moderate pericardial effusion. - do not suspect ACS, probable demand ischemia in setting of CVA, HTN, and seizures - does have WMA on echo, Dr Royann Shivers had ordered lexiscan but has been postponed due to her ongoing medical illness.  Plan myoview prior to discharge.    3. Pericardial effusion - chronic, small compared to 04/2015 study - no evidence of tamponade physiology  4. Seizures - per neurology  5. HTN - bp targets per neurology in setting of CVA and accepted permissive HTN.  - SBP 140-200s, per husband, her SBP has been running 180-190 for 20 yrs    LOS: 8 days   Time spent with pt. :15 minutes. [facility] R  Nurse Practitioner Certified Pager [phone] or after 5pm and on weekends call [phone] 07/20/2015, 12:31 PM

## 2015-07-20 NOTE — Progress Notes (Addendum)
[facility] Progress Note   Gail Chapman  ZOX:[id number]  DOB: March 21, 1956  DOA: 07/12/2015 PCP: No PCP Per Patient  Brief narrative: Gail Chapman is a 59 y.o. female with chronic bilateral lower extremity weakness who is wheelchair bound, hypertension, hyperlipidemia, diabetes mellitus, diastolic heart failure and pulmonary hypertension, COPD on 3 L of oxygen, end-stage renal disease on hemodialysis and obstructive sleep apnea.  The patient presented to the hospital with confusion and dizziness and a witnessed seizure. Seizure episode lasted for about 2-3 minutes. She was initially brought into [facility] ER and noted to have hypertensive urgency with a blood pressure of 260/130 and a troponin of 0.46. Cardizem infusion was started in the ER. She had admitted to missing hemodialysis on that day.  Initial CT scan of the head showed age indeterminate small infarcts. An MRI revealed chronic hemorrhage in the brainstem and left temporal lobe secondary to chronic hypertension but also acute infarcts in the occipital lobe and frontal lobes bilaterally. She was transferred to [facility] and evaluated by neurology.   Subjective: Vomiting prior to having breakfast this AM. Her husband states that this occurs occasionally at home and has been told it's related to diabetic gastroparesis. She has never been on medication for it and symptoms usually resolve on their own. No complaints of shortness of breath, cough, dysuria abdominal pain or diarrhea. Noted to have a low-grade fever by nursing staff-does not complain of symptoms of feeling feverish or having chills.  Assessment/Plan: Principal Problem:   Acute encephalopathy -Suspected to be secondary to accelerated hypertension, CVA and post ictal state -Still appears to be quite slow to respond to questions and intermittently is having difficulty with following commands  Active Problems:   CVA - b/l frontal and occipital  infarcts -Neurology suspects a cardioembolic source vs due to hypotension -  lower extremity duplex negative for DVT -Carotid duplex does not reveal any significant stenosis -Transcranial Doppler consistent with diffuse intracranial atherosclerosis  -Transthoracic echo did not reveal a thrombus-TEE pending- it was to be done today however canceled due to fever and vomiting --Continue full dose aspirin for now -PT recommended skilled nursing facility  Vomiting - Diabetic gastroparesis? Essentially vomited up a small amount of bile/gastric fluid as she had not eaten any breakfast - Resolved-was tolerating liquids while I was in the room - PRN antiemetics  Low-grade fever - Improved without medication-does not appear to have any respiratory symptoms  - Vomiting may indicate possible GI cause- cont to follow- abdominal exam is benign - may need to check UA if fevers continue-no complaints of dysuria or increased frequency- per RN she is mostly incontinent of urine  Seizure -Continue Depakote per neurology - Vimpat added yesterday - EEG reveals an epileptogenic potential  End-stage renal disease on hemodialysis -Dialysis Monday Wednesday Friday-nephrology following  Accelerated hypertension - long h/o uncontrolled BP -improving with adjustment of antihypertensives  -cont Cardura and carvedilol- IV Lopressor ordered to use as needed - hydralazine, labetalol and clonidine mentioned as allergies  Insulin-dependent diabetes mellitus -Continue Levemir and sliding scale insulin  Mild troponin elevation/ focal akinesis on ECHO  - see ECHO report below -Evaluated by cardiology - troponin elevated suspected to be demand ischemia - further work up for focal hypokinesis, per cardiology, to be done at a later date pending completion of neuro w/u   COPD -Chronically on 3 L of oxygen at home  Mod Pericardial effusion - chronic- no tamponade  Generalized weakness PT recommended skilled  nursing facility-  Code Status:     Code Status Orders        Start     Ordered   07/12/15 2046  Full code   Continuous     07/12/15 2047     Family Communication: With husband  Disposition Plan:   skilled nursing facility - social work consulted DVT prophylaxis: Heparin  Consultants:Neurology, nephrology  Procedures: EEG 10/4 Carotid duplex 2-D echo - ejection fraction was in the range of 50% to 55%. - reversible restrictive pattern, (grade 3 diastolic dysfunction) - akinesis of the basal-midinferior myocardium - Mitral valve: Severely calcified annulus. There was moderate regurgitation. - Left atrium: The atrium was moderately dilated. - Right ventricle: The cavity size was mildly dilated. Wall thickness was normal. - Pulmonary arteries: Systolic pressure was mildly increased. PA peak pressure: 43 mm Hg (S). - Pericardium, extracardiac: A small to moderate pericardial effusion was identified posterior to the heart (1.5cm). Features were not consistent with tamponade physiology.  Antibiotics: Anti-infectives    None      Objective: Filed Weights   07/18/15 0325 07/18/15 2118 07/19/15 0648  Weight: 65.318 kg (144 lb) 67.586 kg (149 lb) 67.5 kg (148 lb 13 oz)    Intake/Output Summary (Last 24 hours) at 07/20/15 1302 Last data filed at 07/20/15 0818  Gross per 24 hour  Intake 292.67 ml  Output      0 ml  Net 292.67 ml     Vitals Filed Vitals:   07/20/15 0500 07/20/15 0817 07/20/15 0818 07/20/15 1000  BP: 143/54 187/82  184/74  Pulse: 68 75  69  Temp: 99.3 F (37.4 C) 100.2 F (37.9 C)  99 F (37.2 C)  TempSrc: Oral Oral  Oral  Resp: 18 18    Height:      Weight:      SpO2: 96% 89% 100% 100%    Exam:  General:  Pt is alert, not in acute distress  HEENT: No icterus, No thrush, oral mucosa moist  Cardiovascular: regular rate and rhythm, S1/S2 No murmur  Respiratory: clear to auscultation bilaterally   Abdomen: Soft, +Bowel  sounds, non tender, non distended, no guarding  MSK: No LE edema, cyanosis or clubbing  Neuro: psychomotor slowing noted; Left arm and leg 4 out of 5 in strength-right arm and leg 5 over 5-cranial nerves II through XII intact  Data Reviewed: Basic Metabolic Panel:  Recent Labs Lab 07/17/15 0224 07/18/15 0133 07/19/15 0508 07/19/15 0650 07/20/15 0615  NA 133* 134* 134* 133* 135  K 4.3 4.7 5.2* 5.3* 4.2  CL 99* 98* 96* 97* 97*  CO2 GLUCOSE 87 105* 91 95 85  BUN 21* 35* 57* 59* 24*  CREATININE 3.85* 5.25* 6.60* 6.50* 3.89*  CALCIUM 8.5* 8.5* 8.4* 8.3* 8.4*  PHOS  --  6.5*  --  7.4*  --    Liver Function Tests:  Recent Labs Lab 07/18/15 0133 07/19/15 0650  ALBUMIN 2.7* 2.7*   No results for input(s): LIPASE, AMYLASE in the last 168 hours.  Recent Labs Lab 07/20/15 0710  AMMONIA 33   CBC:  Recent Labs Lab 07/17/15 0224 07/18/15 0133 07/19/15 0508 07/19/15 0650 07/20/15 0615  WBC 9.1 7.4 5.5 6.2 5.3  HGB 9.6* 9.6* 9.7* 9.3* 9.6*  HCT 29.9* 30.5* 31.5* 29.7* 31.6*  MCV 89.5 89.2 89.0 89.2 89.8  PLT 157 160 174 170 173   Cardiac Enzymes:  Recent Labs Lab 07/13/15 1610 07/15/15 0834 07/15/15 1638  07/15/15 1942  TROPONINI 0.05* 0.06* 0.06* 0.05*   BNP (last 3 results)  Recent Labs  07/13/15 0344  BNP >4500.0*    ProBNP (last 3 results) No results for input(s): PROBNP in the last 8760 hours.  CBG:  Recent Labs Lab 07/19/15 1119 07/19/15 1624 07/19/15 2121 07/20/15 0803 07/20/15 1053  GLUCAP 77 114* 110* 85 98    Recent Results (from the past 240 hour(s))  MRSA PCR Screening     Status: None   Collection Time: 07/12/15  7:40 PM  Result Value Ref Range Status   MRSA by PCR NEGATIVE NEGATIVE Final    Comment:        The GeneXpert MRSA Assay (FDA approved for NASAL specimens only), is one component of a comprehensive MRSA colonization surveillance program. It is not intended to diagnose MRSA infection nor to guide  or monitor treatment for MRSA infections.   MRSA PCR Screening     Status: None   Collection Time: 07/14/15  3:05 PM  Result Value Ref Range Status   MRSA by PCR NEGATIVE NEGATIVE Final    Comment:        The GeneXpert MRSA Assay (FDA approved for NASAL specimens only), is one component of a comprehensive MRSA colonization surveillance program. It is not intended to diagnose MRSA infection nor to guide or monitor treatment for MRSA infections.      Studies: No results found.  Scheduled Meds:  Scheduled Meds: . antiseptic oral rinse  7 mL Mouth Rinse BID  . aspirin EC  325 mg Oral Daily  . calcitRIOL  0.25 mcg Oral Q M,W,F-HD  . carvedilol  12.5 mg Oral BID WC  . darbepoetin (ARANESP) injection - DIALYSIS  60 mcg Intravenous Once  . doxazosin  2 mg Oral Daily  . heparin  5,000 Units Subcutaneous 3 times per day  . insulin aspart  0-9 Units Subcutaneous TID WC  . insulin glargine  3 Units Subcutaneous Daily  . lacosamide (VIMPAT) IV  50 mg Intravenous Q12H  . pantoprazole (PROTONIX) IV  40 mg Intravenous Q24H  . pravastatin  40 mg Oral q1800  . regadenoson  0.4 mg Intravenous Once  . sevelamer carbonate  800 mg Oral TID WC  . sodium chloride  3 mL Intravenous Q12H  . valproate sodium  750 mg Intravenous Q12H   Continuous Infusions: . sodium chloride 20 mL/hr at 07/20/15 0600    Time spent on care of this patient: 35 min   Sharicka Pogorzelski, MD 07/20/2015, 1:02 PM  LOS: 8 days   [facility] Office  [phone] Pager - Text Page per [email or web address] If 7PM-7AM, please contact night-coverage [email or web address]

## 2015-07-20 NOTE — Evaluation (Signed)
Occupational Therapy Evaluation Patient Details Name: Gail Chapman MRN: [id number] DOB: 1956/05/12 Today's Date: 07/20/2015    History of Present Illness Pt is a 59 y/o female with a PMH of DM, uncontrolled HTN, CHF, COPD, ESRD (MWF), who initially presented 10/3 with confusion and probable seizure. On 10/5 after HD she had episode of AMS with acute HTN with SBP 200. MRI on 10/6 revealed acute infarcts in occipital love and frontal lobes bilaterally as well as chronic hemorrhage in brainstem and L temporal lobe due to chronic HTN.    Clinical Impression   Pt admitted with the above diagnosis and has the deficits listed below.  Pt will benefit from cont OT to increase participation in adls as well as independence in feeding, grooming and toileting as well as increase functional mobility back to baseline so she can eventually return home with her husband.  Pt is significantly impaired from a cognitive and physical standpoint and will be very difficult to care for alone at home.  Rec further rehab before returning home.    Follow Up Recommendations  SNF;Supervision/Assistance - 24 hour    Equipment Recommendations  Other (comment) (tBD after next venue)    Recommendations for Other Services       Precautions / Restrictions Precautions Precautions: Fall Restrictions Weight Bearing Restrictions: No      Mobility Bed Mobility Overal bed mobility: Needs Assistance;+2 for physical assistance Bed Mobility: Supine to Sit     Supine to sit: Max assist;+2 for physical assistance;HOB elevated     General bed mobility comments: Assist for all aspects of bed mobility. Increased cueing required for pt to keep eyes open and respond verbally to questions.   Transfers Overall transfer level: Needs assistance Equipment used: 2 person hand held assist Transfers: Sit to/from [employer] Sit to Stand: Max assist;+2 physical assistance Stand pivot transfers: Total assist;+2  physical assistance       General transfer comment: Pt not assisting with transfer even with VC and tactile cues.    Balance Overall balance assessment: Needs assistance Sitting-balance support: Feet supported;Bilateral upper extremity supported Sitting balance-Leahy Scale: Poor Sitting balance - Comments: Pt could sit for af ew seconds at a time with S but then would lean posterior and to the R. Postural control: Posterior lean;Right lateral lean Standing balance support: Bilateral upper extremity supported;During functional activity Standing balance-Leahy Scale: Zero Standing balance comment: Pt required +2 total assist to stand.                            ADL Overall ADL's : Needs assistance/impaired Eating/Feeding: Total assistance;Sitting Eating/Feeding Details (indicate cue type and reason): no initiation or follow through.  Pt will stop task and needs max cues to keep going with all adls. Grooming: Therapist, nutritional;Wash/dry hands;Maximal assistance Grooming Details (indicate cue type and reason): started with hand over hand.  Pt could continue after hand over hand taken away for 1-3 seconds and then stops. Upper Body Bathing: Total assistance   Lower Body Bathing: Total assistance   Upper Body Dressing : Total assistance   Lower Body Dressing: Total assistance   Toilet Transfer: +2 for physical assistance;Total assistance;Stand-pivot;BSC Toilet Transfer Details (indicate cue type and reason): pt not initiating moving own feet during transfers. Toileting- Clothing Manipulation and Hygiene: +2 for physical assistance;Total assistance;Sit to/from stand       Functional mobility during ADLs: Total assistance;+2 for physical assistance General ADL Comments: Pt with very  poor initation and follow through and not following consistent commands therefore, at this stage, is dependent with most adls.  Husband did assist pt with bathing, dressing and toileting PTA.      Vision Vision Assessment?: Vision impaired- to be further tested in functional context Additional Comments: Pt at first was not tracking to the R.  Later in session was tracking in all directions.  Very inconsistent.   Perception Perception Perception Tested?: No   Praxis Praxis Praxis tested?: Not tested Praxis-Other Comments: unable to test due to lethargy.    Pertinent Vitals/Pain Pain Assessment: Faces Faces Pain Scale: No hurt     Hand Dominance Right   Extremity/Trunk Assessment Upper Extremity Assessment Upper Extremity Assessment: RUE deficits/detail;LUE deficits/detail;Difficult to assess due to impaired cognition RUE Deficits / Details: moves on command but not through full ROM.  Pt cannot hold arm in air when asked but this may be related to attn. RUE Sensation:  (difficult to assess.) RUE Coordination: decreased fine motor;decreased gross motor LUE Deficits / Details: see RUE LUE Sensation:  (difficult to assess.) LUE Coordination: decreased fine motor;decreased gross motor   Lower Extremity Assessment Lower Extremity Assessment: Defer to PT evaluation   Cervical / Trunk Assessment Cervical / Trunk Assessment: Normal   Communication Communication Communication: Expressive difficulties;Receptive difficulties   Cognition Arousal/Alertness: Lethargic Behavior During Therapy: Flat affect Overall Cognitive Status: Impaired/Different from baseline Area of Impairment: Orientation;Attention;Memory;Following commands;Safety/judgement;Awareness;Problem solving Orientation Level: Disoriented to;Time Current Attention Level: Focused Memory: Decreased recall of precautions;Decreased short-term memory Following Commands: Follows one step commands inconsistently;Follows one step commands with increased time Safety/Judgement: Decreased awareness of safety;Decreased awareness of deficits Awareness: Intellectual Problem Solving: Slow processing;Decreased  initiation;Difficulty sequencing;Requires verbal cues;Requires tactile cues General Comments: Pt, at times, could answer questions, appropriately.  At other times, even when calling her name, pt unable to answer basic questions.  Pt very inconsistent.   General Comments       Exercises       Shoulder Instructions      Home Living Family/patient expects to be discharged to:: Skilled nursing facility Living Arrangements: Spouse/significant other                                      Prior Functioning/Environment Level of Independence: Needs assistance  Gait / Transfers Assistance Needed: pt walked with walker PTA and used w/c at times due to MS ADL's / Homemaking Assistance Needed: husband cooked and cleaned, bathed pt, dressed pt, helped pt toilet.  Pt only got sponge bathes.   Comments: Pt not following many commands. Husband answering most questions.    OT Diagnosis: Generalized weakness;Cognitive deficits;Disturbance of vision   OT Problem List: Decreased strength;Decreased range of motion;Decreased activity tolerance;Impaired balance (sitting and/or standing);Impaired vision/perception;Decreased coordination;Decreased cognition;Decreased safety awareness;Decreased knowledge of use of DME or AE;Decreased knowledge of precautions;Impaired sensation;Impaired UE functional use   OT Treatment/Interventions: Self-care/ADL training;DME and/or AE instruction;Therapeutic activities;Cognitive remediation/compensation;Visual/perceptual remediation/compensation    OT Goals(Current goals can be found in the care plan section) Acute Rehab OT Goals Patient Stated Goal: none stated. OT Goal Formulation: With patient/family Time For Goal Achievement: 08/03/15 Potential to Achieve Goals: Fair ADL Goals Pt Will Perform Eating: with min assist;sitting Pt Will Perform Grooming: with min assist;sitting Pt Will Transfer to Toilet: with min assist;bedside commode Pt Will Perform  Toileting - Clothing Manipulation and hygiene: with min assist;sit to/from stand Additional ADL Goal #1: Pt  will  follow 75% of one step commands to increase participation in OT Additional ADL Goal #2: Pt will locate adl items in her room without assist.  OT Frequency: Min 2X/week   Barriers to D/C:            Co-evaluation              End of Session Equipment Utilized During Treatment: Oxygen Nurse Communication: Mobility status;Other (comment) (Pt threw up at end of treatment)  Activity Tolerance: Patient limited by lethargy Patient left: in chair;with call bell/phone within reach;with family/visitor present   Time: 1610-9604 OT Time Calculation (min): 35 min Charges:  OT General Charges $OT Visit: 1 Procedure OT Evaluation $Initial OT Evaluation Tier I: 1 Procedure OT Treatments $Self Care/Home Management : 8-22 mins G-Codes:    Hope Budds Jul 24, 2015, 12:47 PM  [phone]

## 2015-07-20 NOTE — Progress Notes (Signed)
STROKE TEAM PROGRESS NOTE   SUBJECTIVE (INTERVAL HISTORY) Her husband is at the bedside. Asking about insurance coverage and discharge plan. Dr. Roda Shutters discussed TEE cancellation and medication tx for seziures with him.    OBJECTIVE Temp:  [98.9 F (37.2 C)-100.2 F (37.9 C)] 99 F (37.2 C) (10/11 1000) Pulse Rate:  [68-78] 69 (10/11 1000) Cardiac Rhythm:  [-] Normal sinus rhythm (10/11 0900) Resp:  [18] 18 (10/11 0817) BP: (143-189)/(54-86) 184/74 mmHg (10/11 1000) SpO2:  [89 %-100 %] 100 % (10/11 1000)  CBC:   Recent Labs Lab 07/19/15 0650 07/20/15 0615  WBC 6.2 5.3  HGB 9.3* 9.6*  HCT 29.7* 31.6*  MCV 89.2 89.8  PLT 170 173    Basic Metabolic Panel:   Recent Labs Lab 07/18/15 0133  07/19/15 0650 07/20/15 0615  NA 134*  < > 133* 135  K 4.7  < > 5.3* 4.2  CL 98*  < > 97* 97*  CO2 23  < > 24 28  GLUCOSE 105*  < > 95 85  BUN 35*  < > 59* 24*  CREATININE 5.25*  < > 6.50* 3.89*  CALCIUM 8.5*  < > 8.3* 8.4*  PHOS 6.5*  --  7.4*  --   < > = values in this interval not displayed.  IMAGING  Ct Head Wo Contrast 07/14/2015    Atrophy with extensive periventricular small vessel disease. Age uncertain small infarcts at several sites as described. No hemorrhage or mass effect. Mild ethmoid sinus disease bilaterally.     Mr Brain Wo Contrast 07/15/2015    Atrophy and moderate to advanced chronic ischemia. Chronic hemorrhage in the brainstem and left temporal lobe due to chronic hypertension  Acute infarcts in the occipital lobe and frontal lobes bilaterally.     2D Echocardiogram   - Left ventricle: The cavity size was normal. There was severeconcentric hypertrophy. Systolic function was normal. Theestimated ejection fraction was in the range of 50% to 55%. Thereis akinesis of the basal-midinferior myocardium. Dopplerparameters are consistent with a reversible restrictive pattern,indicative of decreased left ventricular diastolic complianceand/or increased left atrial  pressure (grade 3 diastolicdysfunction). - Mitral valve: Severely calcified annulus. There was moderateregurgitation. - Left atrium: The atrium was moderately dilated. - Right ventricle: The cavity size was mildly dilated. Wallthickness was normal. - Pulmonary arteries: Systolic pressure was mildly increased. PApeak pressure: 43 mm Hg (S). - Pericardium, extracardiac: A small to moderate pericardialeffusion was identified posterior to the heart (1.5cm). Featureswere not consistent with tamponade physiology.  EEG 07/13/15 This is an abnormal EEG secondary to focal sharp wave activity in the frontal region on the left with phase reversal at F7. This finding is suggestive of a focal disturbance with epileptogenic potential.   EEG 07/14/15 This is an abnormal EEG secondary to focal sharp wave activity in the frontal regions bilaterally (left more than right) with phase reversal at [facility] most predominantly. This finding is suggestive of a focal disturbance with epileptogenic potential.  EEG 07/19/15 - 1) bilateral independent parietooccipital sharp waves. 2) Generalized irregular delta activity occurring in bursts. Clinical Interpretation: This EEG is consistent with bilateral areas of potential epileptogenicity in the parietooccipital regions in the setting of a generalized non-specific cerebral dysfunction(encephalopathy). In comparison to the previous study, there has been improvement in the frontotemporal sharp waves, but persistence of the parieto-occipital sharps.  Carotid US - Bilateral: 1-39% ICA stenosis. Vertebral artery flow is antegrade.  LE venous doppler - no DVT  TCD Low normal mean flow velocities  in majority of identified vessels of anterior and posterior circulation due to suboptimal windows and suspect diffuse intracranial atherosclerosis given globally elevated pulsatility indexes.  TEE pending  Component     Latest Ref Rng 07/19/2015 07/20/2015  Valproic Acid Lvl      50.0 - 100.0 ug/mL 77 58  Ammonia     9 - 35 umol/L  33    PHYSICAL EXAM Middle-aged obese African-American lady not in distress. Sitting in chair, mildly restless. Afebrile. Head is nontraumatic. Neck is supple without bruit.  Cardiac exam no murmur or gallop. Lungs are clear to auscultation. Distal pulses are well felt.  Neurological Exam :  AAOx2, not orientated to time. Fund of knowledge impaired not knowing current president. Diminished attention, registration and recall. No aphasia or apraxia dysarthria, able to name and repeat. But psychomotor slowing. Eye movements are full range without nystagmus. Blinks to threat bilaterally. Fundi were not visualized. Vision acuity and fields seem adequate. Face is symmetric without weakness. Tongue is midline. Motor system exam reveals no upper or lower extremity drift. Sensation seems preserved bilaterally. Coordination is slow but accurate. Gait was not tested. Deep tendon reflexes are symmetric. Plantars are downgoing.     ASSESSMENT/PLAN Gail Chapman is a 59 y.o. female with history of MS, chronic bilateral leg weakness (using wheelchair), hypertension, hyperlipidemia, diabetes mellitus, diastolic congestive heart failure, pulmonary hypertension, OSA on CPAP, COPD on 3 L oxygen at home, and end-stage renal disease-HD (MWF), who was transferred from [place] with altered mental status, dizziness, seizure. MRI found stroke during admission.   Stroke: occipital lobe and bilateral frontal lobe small infarcts, embolic pattern secondary to unknown source, hypoperfusion due to hypotension vs. Afib. Pt had hypotensive episodes correlating the time period of infarct. Most of punctate infarct support watershed distribution except one in the right MCA territory.   Resultant no significant focal deficit, but psychomotor slowing  MRI  occipital lobe and bilateral frontal lobe small infarcts  TCD diffuse athero  Carotid Doppler  1-39% bilateral  stenosis  2D Echo  No source of embolus    LE venous doppler negative for DVT Cardiology unable to perform TEE this am due to nausea, vomiting, fever, change in LOC, concern for sedation    Heparin 5000 units sq tid for VTE prophylaxis Diet renal with fluid restriction Fluid restriction:: 1200 mL Fluid; Room service appropriate?: Yes; Fluid consistency:: Thin  aspirin 325 mg orally every day prior to admission, now on aspirin 325 mg orally every day.   Ongoing aggressive stroke risk factor management  Therapy recommendations:  SNF recommended  Disposition:  pending  Seizure  On depakote  EEG positive 10/4 & 10/5  EEG 10/10 showed  improvement on frontal sharps but continued parietooccipital sharps with cerebral dysfunction.  depakote level 58, Ammonia 33  vimpat added 07/19/2015 for seizure control  Increased depakote to 750 mg bid  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  BP as high as 232/101, as low as 66/30  Stable currently  BP goal normotensive  Hyperlipidemia  Home meds:  No statin   LDL 93, goal < 70  New pravachol 40 added this admission  Continue statin at discharge  Diabetes type II  HgbA1c 5.9, at goal < 7.0  Controlled  Other Stroke Risk Factors  Former Cigarette smoker  Coronary artery disease  Elevated troponin - card consult - possible inferior wall akinesis area by echo that should be evaluated (not urgent).  Obstructive sleep apnea, on CPAP at home, 3L  O2  Other Active Problems  ESRD on HD MWF  Anemia of chronic disease  Metabolic bone disease  COPD  Chronic diastolic heart failure  Multiple sclerosis w/ chronic B leg weakness, uses w/c  Pulmonary hypertension   Hospital day # 8  Marvel Plan, MD PhD Stroke Neurology 07/20/2015 5:26 PM   To contact Stroke Continuity provider, please refer to [email or web address]. After hours, contact General Neurology

## 2015-07-20 NOTE — Progress Notes (Signed)
Physical Therapy Treatment Patient Details Name: Gail Chapman MRN: [id number] DOB: 10-13-1955 Today's Date: 07/20/2015    History of Present Illness Pt is a 59 y/o female with a PMH of DM, uncontrolled HTN, CHF, COPD, ESRD (MWF), who initially presented 10/3 with confusion and probable seizure. On 10/5 after HD she had episode of AMS with acute HTN with SBP 200. MRI on 10/6 revealed acute infarcts in occipital love and frontal lobes bilaterally as well as chronic hemorrhage in brainstem and L temporal lobe due to chronic HTN.     PT Comments    Pt mobility greatly limited by attn span and decreased level of arousal. Pt maintained eye opening for 30 seconds and was able to respond to questions appropriately and follow 1 step commands during that time but then would close eyes and be non-responsive. PT [facility] to stroke. Pt with noted significant functional and cognitive deficits reuqiring 2 person assist. Con't to recommend ST-SNF upon d/c to achieve maximal functional reocvery. If patient's level of arousal improves pt may be able to tolerate CIR. WIll con't to assess daily.   Follow Up Recommendations  SNF;Supervision/Assistance - 24 hour     Equipment Recommendations  Wheelchair (measurements PT);Wheelchair cushion (measurements PT)    Recommendations for Other Services       Precautions / Restrictions Precautions Precautions: Fall Restrictions Weight Bearing Restrictions: No    Mobility  Bed Mobility Overal bed mobility: Needs Assistance Bed Mobility: Supine to Sit     Supine to sit: Max assist;+2 for physical assistance;HOB elevated     General bed mobility comments: unable to follow simple one step command  to assist in transfer, max directional verbal and tactile cues to complete task  Transfers Overall transfer level: Needs assistance Equipment used:  (2 person lift with gait belt and bed pad) Transfers: Sit to/from Stand Sit to Stand: Max  assist;+2 physical assistance Stand pivot transfers: Total assist;+2 physical assistance       General transfer comment: pt requires physical assist to advance LEs to complete transfer  Ambulation/Gait             General Gait Details: unable   Stairs            Wheelchair Mobility    Modified Rankin (Stroke Patients Only) Modified Rankin (Stroke Patients Only) Pre-Morbid Rankin Score: Slight disability Modified Rankin: Severe disability     Balance Overall balance assessment: Needs assistance Sitting-balance support: Feet supported;Single extremity supported Sitting balance-Leahy Scale: Poor Sitting balance - Comments: Pt could sit for af ew seconds at a time with S but then would lean posterior and to the R. Postural control: Posterior lean Standing balance support: Bilateral upper extremity supported Standing balance-Leahy Scale: Zero Standing balance comment: pt unable to maintian midline without external support                    Cognition Arousal/Alertness: Lethargic Behavior During Therapy: Flat affect Overall Cognitive Status: Impaired/Different from baseline Area of Impairment: Orientation;Attention;Memory;Following commands;Safety/judgement;Awareness;Problem solving Orientation Level: Disoriented to;Time Current Attention Level: Focused Memory: Decreased recall of precautions;Decreased short-term memory Following Commands: Follows one step commands inconsistently;Follows one step commands with increased time Safety/Judgement: Decreased awareness of safety;Decreased awareness of deficits Awareness: Intellectual Problem Solving: Slow processing;Decreased initiation;Difficulty sequencing;Requires verbal cues;Requires tactile cues General Comments: Pt, at times, could answer questions, appropriately.  At other times, even when calling her name, pt unable to answer basic questions.  Pt very inconsistent.  Exercises      General Comments  General comments (skin integrity, edema, etc.): unsure of pt's visual deficits. pt inconsistant with command following. pt appeared to track all directions but when asked how many fingers were being held up pt perseverated on "2".      Pertinent Vitals/Pain Pain Assessment: Faces Faces Pain Scale: No hurt    Home Living Family/patient expects to be discharged to:: Skilled nursing facility Living Arrangements: Spouse/significant other                  Prior Function Level of Independence: Needs assistance  Gait / Transfers Assistance Needed: pt walked with walker PTA and used w/c at times due to MS ADL's / Homemaking Assistance Needed: husband cooked and cleaned, bathed pt, dressed pt, helped pt toilet.  Pt only got sponge bathes. Comments: Pt not following many commands. Husband answering most questions.   PT Goals (current goals can now be found in the care plan section) Acute Rehab PT Goals Patient Stated Goal: none state Progress towards PT goals: Not progressing toward goals - comment (pt with decreased level of arousal)    Frequency  Min 3X/week    PT Plan Current plan remains appropriate    Co-evaluation             End of Session Equipment Utilized During Treatment: Gait belt;Oxygen Activity Tolerance: Patient limited by fatigue;Patient limited by lethargy Patient left: in chair;with call bell/phone within reach;with chair alarm set;Other (comment)     Time: 7829-5621 PT Time Calculation (min) (ACUTE ONLY): 23 min  Charges:  $Therapeutic Activity: 8-22 mins                    G Codes:      Marcene Brawn 07/20/2015, 3:03 PM   Lewis Shock, PT, DPT Pager #: [phone] Office #: [phone]

## 2015-07-20 NOTE — Progress Notes (Signed)
Subjective:   Not feeling well today- vomiting, low grade temp. TEE cancelled.   Objective Filed Vitals:   07/19/15 2100 07/20/15 0500 07/20/15 0817 07/20/15 0818  BP: 147/62 143/54 187/82   Pulse: 72 68 75   Temp: 99 F (37.2 C) 99.3 F (37.4 C) 100.2 F (37.9 C)   TempSrc: Oral Oral Oral   Resp: 18 18 18    Height:      Weight:      SpO2: 100% 96% 89% 100%   Physical Exam General: drowsy, arousable. No acute distress. Husband at bedside  Heart: RRR  Lungs: CTA, unlabored  Abdomen: soft, nontender +BS  Extremities: no edema  Dialysis Access:  L IJ cath.   Dialysis Orders: MWF [facility] 4 hrs 70.5kgs 2K/2.5CA L IJ Cath (does not have any perm access) Heparin none Micera 75q 2 weeks Calcitriol 0.25   Assessment/Plan: 1. Seizures (new) / bilat acute CVA's - on depakote and vimpat, EEG + but showed some improvement on 10/10. New acute scattered CVA's by MRI. Cardiology suspects intermittent afib.  LE doppler no DVT. TEE cancelled today. 2. AMS - related to hypotension episode 3. Elevated troponin- cardiology following. ECHO EF 50-55% 4. ESRD - MWF [place]. HD- Below dry wt.  5. Hypertension/volume - BP's variable SBP 130-190s- symptomatic with BP drop- runs 180s at home. 3 kg under dry wt No UF w HD for now.  6. Anemia - hgb 9.6- on micera outpt- start Aranesp here 7. Metabolic bone disease - Corr ca 9.5 Cont renvela. And calcitriol per outpt orders 8. Nutrition - dys diet. alb 2.7 9. DM- per primary  Jetty Duhamel, NP [facility] [phone] 07/20/2015,8:51 AM  LOS: 8 days    Additional Objective Labs: Basic Metabolic Panel:  Recent Labs Lab 07/18/15 0133 07/19/15 0508 07/19/15 0650 07/20/15 0615  NA 134* 134* 133* 135  K 4.7 5.2* 5.3* 4.2  CL 98* 96* 97* 97*  CO2 23 24 24 28   GLUCOSE 105* 91 95 85  BUN 35* 57* 59* 24*  CREATININE 5.25* 6.60* 6.50* 3.89*  CALCIUM 8.5* 8.4* 8.3* 8.4*  PHOS 6.5*  --  7.4*  --     Liver Function Tests:  Recent Labs Lab 07/18/15 0133 07/19/15 0650  ALBUMIN 2.7* 2.7*   No results for input(s): LIPASE, AMYLASE in the last 168 hours. CBC:  Recent Labs Lab 07/17/15 0224 07/18/15 0133 07/19/15 0508 07/19/15 0650 07/20/15 0615  WBC 9.1 7.4 5.5 6.2 5.3  HGB 9.6* 9.6* 9.7* 9.3* 9.6*  HCT 29.9* 30.5* 31.5* 29.7* 31.6*  MCV 89.5 89.2 89.0 89.2 89.8  PLT 157 160 174 170 173   Blood Culture    Component Value Date/Time   SDES PERICARDIAL FLUID 02/26/2013 1557   SPECREQUEST NONE 02/26/2013 1557   CULT NO GROWTH 3 DAYS 02/26/2013 1557   REPTSTATUS 03/02/2013 FINAL 02/26/2013 1557    Cardiac Enzymes:  Recent Labs Lab 07/13/15 1128 07/13/15 1610 07/15/15 0834 07/15/15 1638 07/15/15 1942  TROPONINI 0.06* 0.05* 0.06* 0.06* 0.05*   CBG:  Recent Labs Lab 07/18/15 2111 07/19/15 1119 07/19/15 1624 07/19/15 2121 07/20/15 0803  GLUCAP 117* 77 114* 110* 85   Iron Studies: No results for input(s): IRON, TIBC, TRANSFERRIN, FERRITIN in the last 72 hours. @lablastinr3 @ Studies/Results: No results found. Medications: . sodium chloride 20 mL/hr at 07/20/15 0600   . antiseptic oral rinse  7 mL Mouth Rinse BID  . aspirin EC  325 mg Oral Daily  . calcitRIOL  0.25 mcg Oral Q M,W,F-HD  . carvedilol  12.5 mg Oral BID WC  . darbepoetin (ARANESP) injection - DIALYSIS  60 mcg Intravenous Once  . doxazosin  2 mg Oral Daily  . heparin  5,000 Units Subcutaneous 3 times per day  . insulin aspart  0-9 Units Subcutaneous TID WC  . insulin glargine  3 Units Subcutaneous Daily  . lacosamide (VIMPAT) IV  50 mg Intravenous Q12H  . pantoprazole (PROTONIX) IV  40 mg Intravenous Q24H  . pravastatin  40 mg Oral q1800  . regadenoson  0.4 mg Intravenous Once  . sevelamer carbonate  800 mg Oral TID WC  . sodium chloride  3 mL Intravenous Q12H  . valproate sodium  500 mg Intravenous Q12H     I have seen and examined this patient and agree with plan as outlined by  Jetty Duhamel, NP.  Weak, lethargic, and complaining of nausea.  Work up under way. Chandrea Zellman A,MD 07/20/2015 11:42 AM

## 2015-07-20 NOTE — Progress Notes (Signed)
Patient ID: Gail Chapman, female   DOB: 1956-01-07, 59 y.o.   MRN: [id number] Patient examined chart reviewed for TEE Had arranged for anesthesia to help with propofol given Multiple comorbidities Concerns about patients clinical status confirmed by husband and nurse this am  Multiple episodes of dry heaves/vomiting Mental status change not alert Ferver  Given marginal status for TEE and conscious sedation will cancel TEE this  Am given deterioration in clinical status.    If rescheduled make sure it is with anesthesia  Charlton Haws

## 2015-07-20 NOTE — Progress Notes (Signed)
Placed pt on full face mask Cpap with 2 liters bled in.  Tolerating well. No issues to report at this time.  Will continue to monitor.

## 2015-07-20 NOTE — Progress Notes (Signed)
Pt nauseous, vomited x 2, moderate amt yellow liquid emesis.  Temp 100.2 orally. bp elevated.  Called Dr. Eden Emms, advised of condition.  T/O Dr. Eden Emms, cancel NPO order.  Will cancel TEE for today.

## 2015-07-21 DIAGNOSIS — R410 Disorientation, unspecified: Secondary | ICD-10-CM

## 2015-07-21 LAB — CBC
HEMATOCRIT: 31.7 % — AB (ref 36.0–46.0)
HEMOGLOBIN: 10.2 g/dL — AB (ref 12.0–15.0)
MCH: 28.6 pg (ref 26.0–34.0)
MCHC: 32.2 g/dL (ref 30.0–36.0)
MCV: 88.8 fL (ref 78.0–100.0)
PLATELETS: 177 10*3/uL (ref 150–400)
RBC: 3.57 MIL/uL — AB (ref 3.87–5.11)
RDW: 15.6 % — ABNORMAL HIGH (ref 11.5–15.5)
WBC: 7.8 10*3/uL (ref 4.0–10.5)

## 2015-07-21 LAB — BASIC METABOLIC PANEL
ANION GAP: 16 — AB (ref 5–15)
BUN: 36 mg/dL — AB (ref 6–20)
CALCIUM: 9.1 mg/dL (ref 8.9–10.3)
CO2: 24 mmol/L (ref 22–32)
Chloride: 94 mmol/L — ABNORMAL LOW (ref 101–111)
Creatinine, Ser: 5.14 mg/dL — ABNORMAL HIGH (ref 0.44–1.00)
GFR calc Af Amer: 10 mL/min — ABNORMAL LOW (ref >60)
GFR, EST NON AFRICAN AMERICAN: 8 mL/min — AB (ref >60)
GLUCOSE: 84 mg/dL (ref 65–99)
Potassium: 4.7 mmol/L (ref 3.5–5.1)
Sodium: 134 mmol/L — ABNORMAL LOW (ref 135–145)

## 2015-07-21 LAB — GLUCOSE, CAPILLARY
GLUCOSE-CAPILLARY: 88 mg/dL (ref 65–99)
Glucose-Capillary: 80 mg/dL (ref 65–99)
Glucose-Capillary: 89 mg/dL (ref 65–99)

## 2015-07-21 LAB — VALPROIC ACID LEVEL: Valproic Acid Lvl: 67 ug/mL (ref 50.0–100.0)

## 2015-07-21 MED ORDER — SODIUM CHLORIDE 0.9 % IV SOLN
8.0000 mg/kg | Freq: Once | INTRAVENOUS | Status: AC
  Administered 2015-07-21: 540 mg via INTRAVENOUS
  Filled 2015-07-21: qty 10.8

## 2015-07-21 MED ORDER — PIPERACILLIN-TAZOBACTAM IN DEX 2-0.25 GM/50ML IV SOLN
2.2500 g | Freq: Three times a day (TID) | INTRAVENOUS | Status: DC
  Administered 2015-07-22 (×4): 2.25 g via INTRAVENOUS
  Filled 2015-07-21 (×8): qty 50

## 2015-07-21 MED ORDER — DIVALPROEX SODIUM 250 MG PO DR TAB
750.0000 mg | DELAYED_RELEASE_TABLET | Freq: Two times a day (BID) | ORAL | Status: DC
  Administered 2015-07-21 – 2015-07-26 (×9): 750 mg via ORAL
  Filled 2015-07-21 (×12): qty 1

## 2015-07-21 NOTE — Progress Notes (Signed)
STROKE TEAM PROGRESS NOTE   SUBJECTIVE (INTERVAL HISTORY) Her husband is at the bedside. States her mental status is better until she received IV Depakote which made her drowsy sleepy. Will change to po depakote. She is sitting in chair for lunch and husband is feeding her. Afebrile over night.   OBJECTIVE Temp:  [97.7 F (36.5 C)-99.1 F (37.3 C)] 99.1 F (37.3 C) (10/12 0833) Pulse Rate:  [64-80] 75 (10/12 0833) Cardiac Rhythm:  [-] Normal sinus rhythm (10/12 0700) Resp:  [17-18] 17 (10/12 0833) BP: (158-184)/(58-76) 158/65 mmHg (10/12 0833) SpO2:  [93 %-100 %] 93 % (10/12 0833)  CBC:   Recent Labs Lab 07/20/15 0615 07/21/15 0452  WBC 5.3 7.8  HGB 9.6* 10.2*  HCT 31.6* 31.7*  MCV 89.8 88.8  PLT 173 177    Basic Metabolic Panel:   Recent Labs Lab 07/18/15 0133  07/19/15 0650 07/20/15 0615 07/21/15 0452  NA 134*  < > 133* 135 134*  K 4.7  < > 5.3* 4.2 4.7  CL 98*  < > 97* 97* 94*  CO2 23  < > GLUCOSE 105*  < > 95 85 84  BUN 35*  < > 59* 24* 36*  CREATININE 5.25*  < > 6.50* 3.89* 5.14*  CALCIUM 8.5*  < > 8.3* 8.4* 9.1  PHOS 6.5*  --  7.4*  --   --   < > = values in this interval not displayed.  IMAGING  Ct Head Wo Contrast 07/14/2015    Atrophy with extensive periventricular small vessel disease. Age uncertain small infarcts at several sites as described. No hemorrhage or mass effect. Mild ethmoid sinus disease bilaterally.     Mr Brain Wo Contrast 07/15/2015    Atrophy and moderate to advanced chronic ischemia. Chronic hemorrhage in the brainstem and left temporal lobe due to chronic hypertension  Acute infarcts in the occipital lobe and frontal lobes bilaterally.     2D Echocardiogram   - Left ventricle: The cavity size was normal. There was severeconcentric hypertrophy. Systolic function was normal. Theestimated ejection fraction was in the range of 50% to 55%. Thereis akinesis of the basal-midinferior myocardium. Dopplerparameters are  consistent with a reversible restrictive pattern,indicative of decreased left ventricular diastolic complianceand/or increased left atrial pressure (grade 3 diastolicdysfunction). - Mitral valve: Severely calcified annulus. There was moderateregurgitation. - Left atrium: The atrium was moderately dilated. - Right ventricle: The cavity size was mildly dilated. Wallthickness was normal. - Pulmonary arteries: Systolic pressure was mildly increased. PApeak pressure: 43 mm Hg (S). - Pericardium, extracardiac: A small to moderate pericardialeffusion was identified posterior to the heart (1.5cm). Featureswere not consistent with tamponade physiology.  EEG 07/13/15 This is an abnormal EEG secondary to focal sharp wave activity in the frontal region on the left with phase reversal at F7. This finding is suggestive of a focal disturbance with epileptogenic potential.   EEG 07/14/15 This is an abnormal EEG secondary to focal sharp wave activity in the frontal regions bilaterally (left more than right) with phase reversal at [facility] most predominantly. This finding is suggestive of a focal disturbance with epileptogenic potential.  EEG 07/19/15 - 1) bilateral independent parietooccipital sharp waves. 2) Generalized irregular delta activity occurring in bursts. Clinical Interpretation: This EEG is consistent with bilateral areas of potential epileptogenicity in the parietooccipital regions in the setting of a generalized non-specific cerebral dysfunction(encephalopathy). In comparison to the previous study, there has been improvement in the frontotemporal sharp waves, but persistence of the  parieto-occipital sharps.  Carotid US - Bilateral: 1-39% ICA stenosis. Vertebral artery flow is antegrade.  LE venous doppler - no DVT  TCD Low normal mean flow velocities in majority of identified vessels of anterior and posterior circulation due to suboptimal windows and suspect diffuse intracranial atherosclerosis  given globally elevated pulsatility indexes.  TEE pending  Component     Latest Ref Rng 07/19/2015 07/20/2015 07/21/2015  Valproic Acid Lvl     50.0 - 100.0 ug/mL 77 58 67  Ammonia     9 - 35 umol/L  33     PHYSICAL EXAM Middle-aged obese African-American lady not in distress. Sitting in chair, mildly restless. Afebrile. Head is nontraumatic. Neck is supple without bruit.  Cardiac exam no murmur or gallop. Lungs are clear to auscultation. Distal pulses are well felt.  Neurological Exam :  AAOx2, not orientated to time. Fund of knowledge impaired not knowing current president. Diminished attention, registration and recall. No aphasia or apraxia dysarthria, able to name and repeat. But psychomotor slowing. Eye movements are full range without nystagmus. Blinks to threat bilaterally. Fundi were not visualized. Vision acuity and fields seem adequate. Face is symmetric without weakness. Tongue is midline. Motor system exam reveals no upper or lower extremity drift. Sensation seems preserved bilaterally. Coordination is slow but accurate. Gait was not tested. Deep tendon reflexes are symmetric. Plantars are downgoing.     ASSESSMENT/PLAN Gail Chapman is a 59 y.o. female with history of MS, chronic bilateral leg weakness (using wheelchair), hypertension, hyperlipidemia, diabetes mellitus, diastolic congestive heart failure, pulmonary hypertension, OSA on CPAP, COPD on 3 L oxygen at home, and end-stage renal disease-HD (MWF), who was transferred from [place] with altered mental status, dizziness, seizure. MRI found stroke during admission.   Stroke: occipital lobe and bilateral frontal lobe small infarcts, embolic pattern secondary to unknown source, hypoperfusion due to hypotension vs. Afib. Pt had hypotensive episodes correlating the time period of infarct. Most of punctate infarct support watershed distribution except one in the right MCA territory.   Resultant no significant focal  deficit, but psychomotor slowing  MRI  occipital lobe and bilateral frontal lobe small infarcts  TCD diffuse athero  Carotid Doppler  1-39% bilateral stenosis  2D Echo  No source of embolus    LE venous doppler negative for DVT  Plans for TEE Thurs or Fri per cardiology under anesthesia  Heparin 5000 units sq tid for VTE prophylaxis Diet renal with fluid restriction Fluid restriction:: 1200 mL Fluid; Room service appropriate?: Yes; Fluid consistency:: Thin  aspirin 325 mg orally every day prior to admission, now on aspirin 325 mg orally every day.   Ongoing aggressive stroke risk factor management  Therapy recommendations:  SNF recommended  Disposition:  pending  Seizure  On depakote  EEG positive 10/4 & 10/5   EEG 10/10 showed improvement on frontal sharps but continued parietooccipital sharps with cerebral dysfunction.  depakote level 77 -> 55 -> 67   vimpat added 07/19/2015 for seizure control  Increased depakote to 750 mg bid. Will change to po, delayed-release in order to decrease lethargy.  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  BP 158-187/58-82 past 24h (07/21/2015 @ 12:30 PM)  Stable currently  BP goal normotensive  Hyperlipidemia  Home meds:  No statin   LDL 93, goal < 70  New pravachol 40 added this admission  Continue statin at discharge  Diabetes type II  HgbA1c 5.9, at goal < 7.0  Controlled  Other Stroke Risk Factors  Former  Cigarette smoker  Coronary artery disease  Elevated troponin - card consult - think demand ischemia in setting of CVA, HTN, and seizures  Obstructive sleep apnea, on CPAP at home, 3L O2  Other Active Problems  ESRD on HD MWF, HD on hold for today  Anemia of chronic disease  Metabolic bone disease  COPD  Chronic diastolic heart failure  Multiple sclerosis w/ chronic B leg weakness, uses w/c  Pulmonary hypertension   Pericardial effusion, chronic, small  Hospital day # 9  Marvel Plan,  MD PhD Stroke Neurology 07/21/2015 7:35 PM  To contact Stroke Continuity provider, please refer to [email or web address]. After hours, contact General Neurology

## 2015-07-21 NOTE — Progress Notes (Signed)
Patient Name: Gail Chapman Date of Encounter: 07/21/2015  Primary Cardiologist Dr. Gala Romney    Principal Problem:   Acute encephalopathy Active Problems:   OSA (obstructive sleep apnea)   Hypertensive emergency   Pulmonary hypertension (HCC)   Chronic diastolic CHF (congestive heart failure) (HCC)   COPD (chronic obstructive pulmonary disease) (HCC)   Seizures (HCC)   Elevated troponin   MS (multiple sclerosis) (HCC)   ESRD on dialysis (HCC)   Acute on chronic diastolic heart failure (HCC)   Cerebral embolism with cerebral infarction   Cerebral thrombosis with cerebral infarction   Stroke with cerebral ischemia (HCC)    SUBJECTIVE  Husband refused Vimpat and dialysis this morning. Concerning medication is make her mental status change. Per husband, her nephrew was here last night, her mental status was good, and she was able to followup every command. She became drowsy again after medication. Patient sitting down at bedside chair, denies any discomfort. Still nauseated, but better than yesterday, had a BM last night, 1st BM since admission. No more vomiting.   CURRENT MEDS . antiseptic oral rinse  7 mL Mouth Rinse BID  . aspirin EC  325 mg Oral Daily  . calcitRIOL  0.25 mcg Oral Q M,W,F-HD  . carvedilol  12.5 mg Oral BID WC  . darbepoetin (ARANESP) injection - DIALYSIS  60 mcg Intravenous Q Wed-HD  . doxazosin  2 mg Oral Daily  . heparin  5,000 Units Subcutaneous 3 times per day  . insulin aspart  0-9 Units Subcutaneous TID WC  . insulin glargine  3 Units Subcutaneous Daily  . lacosamide (VIMPAT) IV  50 mg Intravenous Q12H  . pantoprazole (PROTONIX) IV  40 mg Intravenous Q24H  . pravastatin  40 mg Oral q1800  . regadenoson  0.4 mg Intravenous Once  . sevelamer carbonate  800 mg Oral TID WC  . sodium chloride  3 mL Intravenous Q12H  . valproate sodium  750 mg Intravenous Q12H    OBJECTIVE  Filed Vitals:   07/20/15 1940 07/20/15 2100 07/21/15 0500 07/21/15  0833  BP:  165/58 184/76 158/65  Pulse: 65 66 80 75  Temp:  97.7 F (36.5 C) 98.6 F (37 C) 99.1 F (37.3 C)  TempSrc:  Oral Oral Oral  Resp: Height:      Weight:      SpO2: 98% 100% 98% 93%    Intake/Output Summary (Last 24 hours) at 07/21/15 1155 Last data filed at 07/21/15 0833  Gross per 24 hour  Intake    240 ml  Output      1 ml  Net    239 ml   Filed Weights   07/18/15 0325 07/18/15 2118 07/19/15 0648  Weight: 144 lb (65.318 kg) 149 lb (67.586 kg) 148 lb 13 oz (67.5 kg)    PHYSICAL EXAM  General: Pleasant, NAD. Neuro: Alert and oriented X 2 (known who is president is and location, says year is 2017). Able to touch my finger with her R hand per instruction, however did not use her left hand to touch my finger when instructed to. Psych: Normal affect. HEENT:  Normal  Neck: Supple without bruits or JVD. Lungs:  Resp regular and unlabored. Bibasilar rale on exam today Heart: RRR no s3, s4. Bounding pulse with 1/6 systolic murmur Abdomen: Soft, non-tender, non-distended, BS + x 4.  Extremities: No clubbing, cyanosis or edema. 2+ DP pulse on L and did not appreciated DP pulse on R.  Accessory Clinical Findings  CBC  Recent Labs  07/20/15 0615 07/21/15 0452  WBC 5.3 7.8  HGB 9.6* 10.2*  HCT 31.6* 31.7*  MCV 89.8 88.8  PLT 173 177   Basic Metabolic Panel  Recent Labs  07/19/15 0650 07/20/15 0615 07/21/15 0452  NA 133* 135 134*  K 5.3* 4.2 4.7  CL 97* 97* 94*  CO2 GLUCOSE 95 85 84  BUN 59* 24* 36*  CREATININE 6.50* 3.89* 5.14*  CALCIUM 8.3* 8.4* 9.1  PHOS 7.4*  --   --    Liver Function Tests  Recent Labs  07/19/15 0650  ALBUMIN 2.7*    TELE NSR with HR 70s    ECG  No new EKG  Echocardiogram 07/13/2015  LV EF: 50% -  55%  ------------------------------------------------------------------- Indications:   Chest pain 786.51.  ------------------------------------------------------------------- History:   PMH: Elevated troponin. ESRD. Pericardial effusion. Congestive heart failure. Risk factors: Hypertension. Diabetes mellitus.  ------------------------------------------------------------------- Study Conclusions  - Left ventricle: The cavity size was normal. There was severe concentric hypertrophy. Systolic function was normal. The estimated ejection fraction was in the range of 50% to 55%. There is akinesis of the basal-midinferior myocardium. Doppler parameters are consistent with a reversible restrictive pattern, indicative of decreased left ventricular diastolic compliance and/or increased left atrial pressure (grade 3 diastolic dysfunction). - Mitral valve: Severely calcified annulus. There was moderate regurgitation. - Left atrium: The atrium was moderately dilated. - Right ventricle: The cavity size was mildly dilated. Wall thickness was normal. - Pulmonary arteries: Systolic pressure was mildly increased. PA peak pressure: 43 mm Hg (S). - Pericardium, extracardiac: A small to moderate pericardial effusion was identified posterior to the heart (1.5cm). Features were not consistent with tamponade physiology.    Radiology/Studies  Ct Head Wo Contrast  07/14/2015  CLINICAL DATA:  Acute onset confusion and lethargy after dialysis. Chronic renal failure. EXAM: CT HEAD WITHOUT CONTRAST TECHNIQUE: Contiguous axial images were obtained from the base of the skull through the vertex without intravenous contrast. COMPARISON:  None. FINDINGS: Moderate diffuse atrophy is present. There is no intracranial mass, hemorrhage, extra-axial fluid collection, midline shift. There is small vessel disease throughout the centra semiovale bilaterally. There are age uncertain fairly small infarcts in the anterior left occipital and anterior superior left frontal lobes. There is a small age uncertain infarct in the superior posterior right cerebellum. There is also a small age  uncertain infarct in the posterior right temporal lobe lateral and slightly posterior to the temporal horn the right lateral ventricle. There is a small age uncertain infarct in the right lentiform nucleus. Bony calvarium appears intact. The mastoid air cells are clear. There is mild mucosal thickening of several ethmoid air cells bilaterally. IMPRESSION: Atrophy with extensive periventricular small vessel disease. Age uncertain small infarcts at several sites as described. No hemorrhage or mass effect. Mild ethmoid sinus disease bilaterally. Electronically Signed   By: Bretta Bang III M.D.   On: 07/14/2015 14:25   Mr Brain Wo Contrast  07/15/2015  CLINICAL DATA:  Stroke with cerebral ischemia EXAM: MRI HEAD WITHOUT CONTRAST TECHNIQUE: Multiplanar, multiecho pulse sequences of the brain and surrounding structures were obtained without intravenous contrast. COMPARISON:  CT head 07/14/2015 FINDINGS: Multiple areas of restricted diffusion bilaterally compatible with acute infarction. Acute infarct in the occipital lobes bilaterally left greater than right. The left-sided stroke measures up to 11 mm. Small area of acute infarct in the right frontal operculum and in the left frontal white matter. Moderate  to advanced chronic microvascular ischemic changes in the white matter bilaterally. Chronic infarcts in the thalamus bilaterally. Chronic blood products in the left temporal lobe and in the pontomedullary junction compatible with prior hemorrhage. This is likely related to poorly controlled hypertension. Moderate atrophy. Ventricular enlargement consistent with atrophy. No hydrocephalus. Negative for mass lesion. Normal orbit.  Paranasal sinuses clear. Image quality degraded by motion IMPRESSION: Atrophy and moderate to advanced chronic ischemia. Chronic hemorrhage in the brainstem and left temporal lobe due to chronic hypertension Acute infarcts in the occipital lobe and frontal lobes bilaterally.  Electronically Signed   By: Marlan Palau M.D.   On: 07/15/2015 14:45   Dg Chest Port 1 View  07/15/2015  CLINICAL DATA:  E86.9 (ICD-10-CM) - Volume depletion EXAM: PORTABLE CHEST 1 VIEW COMPARISON:  08/28/2014 FINDINGS: Left-sided dialysis catheter tip to the upper right atrium. Cardiopericardial silhouette is enlarged and stable. Lungs are clear. No pulmonary edema. IMPRESSION: No significant change in marked enlargement of the cardiopericardial silhouette. Electronically Signed   By: Norva Pavlov M.D.   On: 07/15/2015 10:20    ASSESSMENT AND PLAN  Paisleigh Kubena Brull is a 59 y.o. female with a history of severe HTN, DM, chronic diastolic CHF, pulmonary HTN, pericardial effusion and ESRD on HD who was admitted from [facility] for serizure. Cardiology consulted for elevated trop 0.07--> 0.06. 2D echo showed basal-midinferior myocardial wall motion abnormality. Initial CT of head negative. However MRI of brain showed acute infarcts in the occipital and front lobes.   1. CVA  - concern for possible cardioembolic source. Carotid U/S no significant blockage.  - no detected afib to this point, continue telemetry.   - she is on ASA 325, statin for secondary prevention. No ACE-I given AKI  - TEE +/- Loop cancelled yesterday, pending reschedule (per Dr. Eden Emms, will need anesthesia if TEE). Nausea improved, did have Tmax 100.2, otherwise afebrile. Mental status still wax and wane. Hopefully can do TEE either tomorrow or Friday.   2. Elevated troponin  - flat mild elevation to 0.06. Chronic lateral and inferior ST/T changes, no new ischemic changes  - echo LVEF 50-55%, akinesis of the basal-midinferior wall, restrictive diastolic function, moderate MR, PASP 43, moderate pericardial effusion.  - do not suspect ACS, probable demand ischemia in setting of CVA, HTN, and seizures  - does have WMA on echo, Dr Royann Shivers had ordered lexiscan but has been postponed due to her ongoing medical illness.    3. Pericardial effusion  - chronic, small compared to 04/2015 study  - no evidence of tamponade physiology  4. Seizures  - per neurology  5. HTN  - bp targets per neurology in setting of CVA and accepted permissive HTN.   - SBP 140-200s, per husband, her SBP has been running 180-190 for 20 yrs  6. ESRD on HD. Refused HD today by husband. Per husband, she does make little urine, but not enough to keep fluid off of her.   - she has bibasilar rale on exam today, difficult to tell if HF vs atelectasis.   Signed, Azalee Course PA-C Pager: [phone]

## 2015-07-21 NOTE — Progress Notes (Addendum)
ANTIBIOTIC CONSULT NOTE - INITIAL  Pharmacy Consult for Zosyn and Daptomycin Indication: rule out sepsis  Allergies  Allergen Reactions  . Clonidine Derivatives     Hand itching  . Hydralazine     Visual disturbances   . Labetalol     Fatigue   . Vancomycin Rash    "shut down my kidneys"    Patient Measurements: Height: 5' 1.5" (156.2 cm) Weight: 148 lb 13 oz (67.5 kg) IBW/kg (Calculated) : 48.95 Adjusted Body Weight:   Vital Signs: Temp: 101.9 F (38.8 C) (10/12 1706) Temp Source: Oral (10/12 1706) BP: 183/90 mmHg (10/12 1706) Pulse Rate: 104 (10/12 1706) Intake/Output from previous day: 10/11 0701 - 10/12 0700 In: 180 [P.O.:180] Out: 1 [Stool:1] Intake/Output from this shift: Total I/O In: 180 [P.O.:180] Out: 0   Labs:  Recent Labs  07/19/15 0650 07/20/15 0615 07/21/15 0452  WBC 6.2 5.3 7.8  HGB 9.3* 9.6* 10.2*  PLT 170 173 177  CREATININE 6.50* 3.89* 5.14*   Estimated Creatinine Clearance: 10.8 mL/min (by C-G formula based on Cr of 5.14). No results for input(s): VANCOTROUGH, VANCOPEAK, VANCORANDOM, GENTTROUGH, GENTPEAK, GENTRANDOM, TOBRATROUGH, TOBRAPEAK, TOBRARND, AMIKACINPEAK, AMIKACINTROU, AMIKACIN in the last 72 hours.   Microbiology: Recent Results (from the past 720 hour(s))  MRSA PCR Screening     Status: None   Collection Time: 07/12/15  7:40 PM  Result Value Ref Range Status   MRSA by PCR NEGATIVE NEGATIVE Final    Comment:        The GeneXpert MRSA Assay (FDA approved for NASAL specimens only), is one component of a comprehensive MRSA colonization surveillance program. It is not intended to diagnose MRSA infection nor to guide or monitor treatment for MRSA infections.   MRSA PCR Screening     Status: None   Collection Time: 07/14/15  3:05 PM  Result Value Ref Range Status   MRSA by PCR NEGATIVE NEGATIVE Final    Comment:        The GeneXpert MRSA Assay (FDA approved for NASAL specimens only), is one component of  a comprehensive MRSA colonization surveillance program. It is not intended to diagnose MRSA infection nor to guide or monitor treatment for MRSA infections.     Medical History: Past Medical History  Diagnosis Date  . Diabetes mellitus without complication (HCC)   . Chronic diastolic CHF (congestive heart failure) (HCC)     a. 01/2007 MV: No isch/infarct, nl EF;  b. 2012 Cath: "normal" per patient.  Performed in [place], [place] by Dr. Graciela Husbands.  . Pulmonary hypertension (HCC)     a. on home O2 @ 3lpm 24hrs/day  . HTN (hypertension)     a. Dx in 1993.  Marland Kitchen LVH (left ventricular hypertrophy)   . OSA (obstructive sleep apnea)     a. uses CPAP  . Physical deconditioning   . Renal disorder   . COPD (chronic obstructive pulmonary disease) (HCC)     Medications:  Scheduled:  . antiseptic oral rinse  7 mL Mouth Rinse BID  . aspirin EC  325 mg Oral Daily  . calcitRIOL  0.25 mcg Oral Q M,W,F-HD  . carvedilol  12.5 mg Oral BID WC  . darbepoetin (ARANESP) injection - DIALYSIS  60 mcg Intravenous Q Wed-HD  . divalproex  750 mg Oral Q12H  . doxazosin  2 mg Oral Daily  . heparin  5,000 Units Subcutaneous 3 times per day  . insulin aspart  0-9 Units Subcutaneous TID WC  . insulin glargine  3  Units Subcutaneous Daily  . lacosamide (VIMPAT) IV  50 mg Intravenous Q12H  . pantoprazole (PROTONIX) IV  40 mg Intravenous Q24H  . pravastatin  40 mg Oral q1800  . regadenoson  0.4 mg Intravenous Once  . sevelamer carbonate  800 mg Oral TID WC  . sodium chloride  3 mL Intravenous Q12H   Assessment: 59yo female admitted 10/3 with AMS, dizziness and seizure and ESRD-HD MWF with hx HTN, DM.  She now has a fever to 101.9, with continued AMS-  to start Daptomycin and Zosyn; she has received no antibiotics this admission.  Vancomcyin is listed as an allergy; pt received in the past which caused an episode of acute kidney failure.  Pt is now on dialysis, but husband is adamant that pt cannot receive  Vancomycin.  Dialysis was refused today by her husband.  Discussed risk of increased Rhabdomyolysis with statins and Daptomycin with Dr Arthor Captain.  Blood cultures have been drawn.  Goal of Therapy:  Treatment of infection  Plan:  Zosyn 2.25g IV q8 Daptomycin  IV qHD, but will order x 1 due to refusal of HD today & possible rescheduling F/U blood cx D/C Pravastatin with plan to resume when Daptomycin completed  Marisue Humble, PharmD Clinical Pharmacist [facility]

## 2015-07-21 NOTE — Care Management Note (Signed)
Case Management Note  Patient Details  Name: MARGURETTE BRENER MRN: [id number] Date of Birth: 10-29-1955  Subjective/Objective:              CM following for progression and d/c planning.      Action/Plan: 07/21/2015 Met with pt and husband, discussed d/c options, pt husband, Mr Lalley is concerned about rehab for his wife as she has been in rehab before and he felt that she received very little rehab and that her medications were handled poorly . The parameters were not those that are her baseline and that she was overtreated for BP and CBG, lowering her levels to those that are not her baseline and resulted her feeling "bad" most of the time. He is torn between SNF for rehab and going home with less rehab but where he is able to manage her medications. We discussed HH and how she would receive HHRN, HHPT and [place] visits, approx 3x/wk.  Mr Virden reported that he was at home most of the time now and that the pt sister lives with them. The sister is able to watch the pt and get help if the pt needs it.  Will continue to follow and arrange [place] as d/c nears and pt will need more DME. She does not have a wheelchair or 3:1 commode. Pt was using at walker at home prior to this admission.   Expected Discharge Date:                  Expected Discharge Plan:  [place]  In-House Referral:  Clinical Social Work  Discharge planning Services  CM Consult  Post Acute Care Choice:  Durable Medical Equipment Choice offered to:  Spouse  DME Arranged:  Oxygen, 3-N-1, Wheelchair manual (active with [employer] per pt for oxygen) DME Agency:  [place] Arranged:  RN, PT, OT, Nurse's Aide [facility] Agency:     Status of Service:  In process, will continue to follow  Medicare Important Message Given:    Date Medicare IM Given:    Medicare IM give by:    Date Additional Medicare IM Given:    Additional Medicare Important Message give by:     If discussed at [place] of  Stay Meetings, dates discussed:    Additional Comments:  Adron Bene, RN 07/21/2015, 11:59 AM

## 2015-07-21 NOTE — Progress Notes (Signed)
[facility] Progress Note   Gail Chapman  ZOX:[id number]  DOB: August 16, 1956  DOA: 07/12/2015 PCP: No PCP Per Patient  Brief narrative: Gail Chapman is a 59 y.o. female with chronic bilateral lower extremity weakness who is wheelchair bound, hypertension, hyperlipidemia, diabetes mellitus, diastolic heart failure and pulmonary hypertension, COPD on 3 L of oxygen, end-stage renal disease on hemodialysis and obstructive sleep apnea.  The patient presented to the hospital with confusion and dizziness and a witnessed seizure. Seizure episode lasted for about 2-3 minutes. She was initially brought into [facility] ER and noted to have hypertensive urgency with a blood pressure of 260/130 and a troponin of 0.46. Cardizem infusion was started in the ER. She had admitted to missing hemodialysis on that day.  Initial CT scan of the head showed age indeterminate small infarcts. An MRI revealed chronic hemorrhage in the brainstem and left temporal lobe secondary to chronic hypertension but also acute infarcts in the occipital lobe and frontal lobes bilaterally. She was transferred to [facility] and evaluated by neurology.   Subjective: Seen this morning was hospitalized at bedside. Denies any complaints, she appears sleepy, easy to arouse but goes back into sleep on less than 1 minute. Blood pressure in the high side, per nephrology note husband refused dialysis today because she is "wiped out".  Assessment/Plan: Principal Problem:   Acute encephalopathy -Suspected to be secondary to accelerated hypertension, CVA and post ictal state -Still appears to be quite slow to respond to questions and intermittently is having difficulty with following commands. 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 Essentially vomited up a small amount of bile/gastric fluid as she had not eaten any breakfast - Resolved-was tolerating liquids while I was in the room - PRN antiemetics  Low-grade fever - Improved without medication-does not appear to have any respiratory symptoms  - Vomiting may indicate possible GI cause- cont to follow- abdominal exam is benign - may need to check UA if fevers continue-no complaints of dysuria or increased frequency- per RN she is mostly incontinent of urine  Seizure -Continue Depakote per neurology - Vimpat added. -EEG reveals an epileptogenic potential  End-stage renal disease on hemodialysis -Dialysis Monday Wednesday Friday-nephrology following  Accelerated hypertension - long h/o uncontrolled BP -improving with adjustment of antihypertensives  -cont Cardura and carvedilol- IV Lopressor ordered to use as needed - hydralazine, labetalol and clonidine mentioned as allergies  Insulin-dependent diabetes mellitus -Continue Levemir and sliding scale insulin  Mild troponin elevation/ focal akinesis on ECHO  - see ECHO report below -Evaluated by cardiology - troponin elevated suspected to be demand ischemia - further work up for focal hypokinesis, per cardiology, to be done at a later date pending completion of neuro w/u   COPD -Chronically on 3 L of oxygen at home  Mod Pericardial effusion - chronic- no tamponade  Generalized weakness PT recommended skilled nursing facility-  Code Status:     Code Status Orders  Start     Ordered   07/12/15 2046   Full code   Continuous     07/12/15 2047     Family Communication: With husband  Disposition Plan:   skilled nursing facility - social work consulted DVT prophylaxis: Heparin  Consultants:Neurology, nephrology  Procedures: EEG 10/4 Carotid duplex 2-D echo - ejection fraction was in the range of 50% to 55%. - reversible restrictive pattern, (grade 3 diastolic dysfunction) - akinesis of the basal-midinferior myocardium - Mitral valve: Severely calcified annulus. There was moderate regurgitation. - Left atrium: The atrium was moderately dilated. - Right ventricle: The cavity size was mildly dilated. Wall thickness was normal. - Pulmonary arteries: Systolic pressure was mildly increased. PA peak pressure: 43 mm Hg (S). - Pericardium, extracardiac: A small to moderate pericardial effusion was identified posterior to the heart (1.5cm). Features were not consistent with tamponade physiology.  Antibiotics: Anti-infectives    None      Objective: Filed Weights   07/18/15 0325 07/18/15 2118 07/19/15 0648  Weight: 65.318 kg (144 lb) 67.586 kg (149 lb) 67.5 kg (148 lb 13 oz)    Intake/Output Summary (Last 24 hours) at 07/21/15 1133 Last data filed at 07/21/15 1610  Gross per 24 hour  Intake    240 ml  Output      1 ml  Net    239 ml     Vitals Filed Vitals:   07/20/15 1940 07/20/15 2100 07/21/15 0500 07/21/15 0833  BP:  165/58 184/76 158/65  Pulse: 65 66 80 75  Temp:  97.7 F (36.5 C) 98.6 F (37 C) 99.1 F (37.3 C)  TempSrc:  Oral Oral Oral  Resp: Height:      Weight:      SpO2: 98% 100% 98% 93%    Exam:  General:  Pt is alert, not in acute distress  HEENT: No icterus, No thrush, oral mucosa moist  Cardiovascular: regular rate and rhythm, S1/S2 No murmur  Respiratory: clear to auscultation bilaterally   Abdomen: Soft, +Bowel sounds, non tender, non distended, no guarding  MSK: No LE edema, cyanosis or clubbing  Neuro:  psychomotor slowing noted; Left arm and leg 4 out of 5 in strength-right arm and leg 5 over 5-cranial nerves II through XII intact  Data Reviewed: Basic Metabolic Panel:  Recent Labs Lab 07/18/15 0133 07/19/15 0508 07/19/15 0650 07/20/15 0615 07/21/15 0452  NA 134* 134* 133* 135 134*  K 4.7 5.2* 5.3* 4.2 4.7  CL 98* 96* 97* 97* 94*  CO2 GLUCOSE 105* 91 95 85 84  BUN 35* 57* 59* 24* 36*  CREATININE 5.25* 6.60* 6.50* 3.89* 5.14*  CALCIUM 8.5* 8.4* 8.3* 8.4* 9.1  PHOS 6.5*  --  7.4*  --   --    Liver Function Tests:  Recent Labs Lab 07/18/15 0133 07/19/15 0650  ALBUMIN 2.7* 2.7*   No results for input(s): LIPASE, AMYLASE in the last 168 hours.  Recent Labs Lab 07/20/15 0710  AMMONIA 33   CBC:  Recent Labs Lab 07/18/15 0133 07/19/15 0508 07/19/15 0650 07/20/15 0615 07/21/15 0452  WBC 7.4 5.5 6.2 5.3 7.8  HGB 9.6* 9.7* 9.3* 9.6* 10.2*  HCT 30.5* 31.5* 29.7* 31.6* 31.7*  MCV 89.2 89.0 89.2 89.8 88.8  PLT 160 174 170 173 177   Cardiac Enzymes:  Recent Labs Lab 07/15/15 0834 07/15/15 1638 07/15/15 1942  TROPONINI 0.06* 0.06* 0.05*   BNP (last 3 results)  Recent Labs  07/13/15 0344  BNP >4500.0*    ProBNP (last 3 results) No results for input(s): PROBNP in the last 8760 hours.  CBG:  Recent Labs Lab 07/20/15 0803 07/20/15 1053 07/20/15 1632 07/20/15 2101 07/21/15 0800  GLUCAP 85 98 81 83 80    Recent Results (from the past 240 hour(s))  MRSA PCR Screening     Status: None   Collection Time: 07/12/15  7:40 PM  Result Value Ref Range Status   MRSA by PCR NEGATIVE NEGATIVE Final    Comment:        The GeneXpert MRSA Assay (FDA approved for NASAL specimens only), is one component of a comprehensive MRSA colonization surveillance program. It is not intended to diagnose MRSA infection nor to guide or monitor treatment for MRSA infections.   MRSA PCR Screening     Status: None   Collection Time: 07/14/15  3:05 PM   Result Value Ref Range Status   MRSA by PCR NEGATIVE NEGATIVE Final    Comment:        The GeneXpert MRSA Assay (FDA approved for NASAL specimens only), is one component of a comprehensive MRSA colonization surveillance program. It is not intended to diagnose MRSA infection nor to guide or monitor treatment for MRSA infections.      Studies: No results found.  Scheduled Meds:  Scheduled Meds: . antiseptic oral rinse  7 mL Mouth Rinse BID  . aspirin EC  325 mg Oral Daily  . calcitRIOL  0.25 mcg Oral Q M,W,F-HD  . carvedilol  12.5 mg Oral BID WC  . darbepoetin (ARANESP) injection - DIALYSIS  60 mcg Intravenous Q Wed-HD  . doxazosin  2 mg Oral Daily  . heparin  5,000 Units Subcutaneous 3 times per day  . insulin aspart  0-9 Units Subcutaneous TID WC  . insulin glargine  3 Units Subcutaneous Daily  . lacosamide (VIMPAT) IV  50 mg Intravenous Q12H  . pantoprazole (PROTONIX) IV  40 mg Intravenous Q24H  . pravastatin  40 mg Oral q1800  . regadenoson  0.4 mg Intravenous Once  . sevelamer carbonate  800 mg Oral TID WC  . sodium chloride  3 mL Intravenous Q12H  . valproate sodium  750 mg Intravenous Q12H   Continuous Infusions: . sodium chloride 20 mL/hr at 07/20/15 0600    Time spent on care of this patient: 35 min   Owais Pruett A, MD 07/21/2015, 11:33 AM  LOS: 9 days   [facility] Office  [phone] Pager - Text Page per [email or web address] If 7PM-7AM, please contact night-coverage [email or web address]

## 2015-07-21 NOTE — Progress Notes (Signed)
Physical Therapy Treatment Patient Details Name: Gail Chapman MRN: [id number] DOB: 10/22/1955 Today's Date: 07/21/2015    History of Present Illness Pt is a 59 y/o female with a PMH of DM, uncontrolled HTN, CHF, COPD, ESRD (MWF), who initially presented 10/3 with confusion and probable seizure. On 10/5 after HD she had episode of AMS with acute HTN with SBP 200. MRI on 10/6 revealed acute infarcts in occipital love and frontal lobes bilaterally as well as chronic hemorrhage in brainstem and L temporal lobe due to chronic HTN.     PT Comments    Pt con't to have significant lethargy. Per spouse pt was following commands and moving all 4 extremities last night prior to receiving seizure medicine. Pt unable to participate in PT at this time due to lethargy. Talked to NP and she reports changing medicine. PT to re-assess tomorrow, if patient able to participate pt may be a candidate for CIR.   Follow Up Recommendations  CIR;Supervision/Assistance - 24 hour (however if patient arousal improves may be appropriate for CIR)     Equipment Recommendations  Wheelchair (measurements PT);Wheelchair cushion (measurements PT)    Recommendations for Other Services       Precautions / Restrictions Precautions Precautions: Fall Restrictions Weight Bearing Restrictions: No    Mobility  Bed Mobility Overal bed mobility: Needs Assistance Bed Mobility: Supine to Sit     Supine to sit: Max assist;+2 for physical assistance;HOB elevated     General bed mobility comments: unable to follow simple one step command  to assist in transfer, max directional verbal and tactile cues to complete task  Transfers Overall transfer level: Needs assistance Equipment used:  (2 person lift with gait belt and bed pad) Transfers: Sit to/from Stand Sit to Stand: Max assist;+2 physical assistance Stand pivot transfers: Total assist;+2 physical assistance       General transfer comment: pt didn't initiate  transfer, did not advance LEs without max assist  Ambulation/Gait                 Stairs            Wheelchair Mobility    Modified Rankin (Stroke Patients Only) Modified Rankin (Stroke Patients Only) Pre-Morbid Rankin Score: Slight disability Modified Rankin: Severe disability     Balance Overall balance assessment: Needs assistance Sitting-balance support: Feet supported;Bilateral upper extremity supported Sitting balance-Leahy Scale: Poor Sitting balance - Comments: Pt could sit for af ew seconds at a time with S but then would lean posterior and to the R. Postural control: Posterior lean   Standing balance-Leahy Scale: Zero Standing balance comment: pt unable to maintain upright position without total assist. pt stood x 2 min but unable to achie full upright posture. pt with loose stool and required to stand for hygiene                    Cognition Arousal/Alertness: Lethargic Behavior During Therapy: Flat affect Overall Cognitive Status: Impaired/Different from baseline Area of Impairment: Following commands;Safety/judgement;Awareness;Problem solving   Current Attention Level: Focused (due to lethargy)   Following Commands: Follows one step commands inconsistently Safety/Judgement: Decreased awareness of safety;Decreased awareness of deficits Awareness: Intellectual Problem Solving: Slow processing;Decreased initiation;Difficulty sequencing;Requires verbal cues;Requires tactile cues General Comments: pt extremely lethargic so unsure if true cognitive deficits or just lethargic/decreased arousal level    Exercises      General Comments General comments (skin integrity, edema, etc.): spoke with Annie Main, NP regarding depicon and she reports changing  the medicine      Pertinent Vitals/Pain Pain Assessment: No/denies pain    Home Living                      Prior Function            PT Goals (current goals can now be found in  the care plan section) Acute Rehab PT Goals Patient Stated Goal: none state Progress towards PT goals: Not progressing toward goals - comment    Frequency  Min 3X/week    PT Plan Current plan remains appropriate    Co-evaluation             End of Session Equipment Utilized During Treatment: Gait belt;Oxygen Activity Tolerance: Patient limited by fatigue;Patient limited by lethargy Patient left: in chair;with call bell/phone within reach;with chair alarm set;Other (comment)     Time: 1610-9604 PT Time Calculation (min) (ACUTE ONLY): 28 min  Charges:  $Therapeutic Activity: 23-37 mins                    G Codes:      Marcene Brawn 07/21/2015, 2:43 PM   Lewis Shock, PT, DPT Pager #: [phone] Office #: [phone]

## 2015-07-21 NOTE — Clinical Social Work Note (Signed)
CSW received consult for SNF placement and talked with husband (at the bedside) regarding ST rehab. Mr. Hantman wants patient to d/c home with [facility] and this was discussed. CSW informed nurse case manager regarding husbands d/c plan so that she can f/u with him.  CSW will continue to follow through discharge.   Genelle Bal, MSW, LCSW Licensed Clinical Social Worker Clinical Social Work Department [employer] [phone]

## 2015-07-21 NOTE — Progress Notes (Signed)
T 101.9, MD notified.

## 2015-07-21 NOTE — Progress Notes (Signed)
Patient ID: Gail Chapman, female   DOB: [birth year], 57 y.o.   MRN: [id number]  [facility] Progress Note    Subjective:   Lethargic but arousable, her husband has refused dialysis today since she is so "wiped out"   Objective:   BP 158/65 mmHg  Pulse 75  Temp(Src) 99.1 F (37.3 C) (Oral)  Resp 17  Ht 5' 1.5" (1.562 m)  Wt 67.5 kg (148 lb 13 oz)  BMI 27.67 kg/m2  SpO2 93%  Intake/Output: I/O last 3 completed shifts: In: 352.7 [P.O.:180; I.V.:172.7] Out: 1 [Stool:1]   Intake/Output this shift:  Total I/O In: 60 [P.O.:60] Out: 0  Weight change:   Physical Exam: Gen:WD AAF, lethargic but arousable, occassional involuntary contractions, easily falls asleep CVS:no rub Resp:cta JYN:WGNFAO Ext:no edema  Labs: BMET  Recent Labs Lab 07/16/15 0241 07/17/15 0224 07/18/15 0133 07/19/15 0508 07/19/15 0650 07/20/15 0615 07/21/15 0452  NA 134* 133* 134* 134* 133* 135 134*  K 5.0 4.3 4.7 5.2* 5.3* 4.2 4.7  CL 98* 99* 98* 96* 97* 97* 94*  CO2 GLUCOSE 77 87 105* 91 95 85 84  BUN 43* 21* 35* 57* 59* 24* 36*  CREATININE 6.01* 3.85* 5.25* 6.60* 6.50* 3.89* 5.14*  ALBUMIN  --   --  2.7*  --  2.7*  --   --   CALCIUM 8.2* 8.5* 8.5* 8.4* 8.3* 8.4* 9.1  PHOS  --   --  6.5*  --  7.4*  --   --    CBC  Recent Labs Lab 07/19/15 0508 07/19/15 0650 07/20/15 0615 07/21/15 0452  WBC 5.5 6.2 5.3 7.8  HGB 9.7* 9.3* 9.6* 10.2*  HCT 31.5* 29.7* 31.6* 31.7*  MCV 89.0 89.2 89.8 88.8  PLT 174 170 173 177    @ Medications:    . antiseptic oral rinse  7 mL Mouth Rinse BID  . aspirin EC  325 mg Oral Daily  . calcitRIOL  0.25 mcg Oral Q M,W,F-HD  . carvedilol  12.5 mg Oral BID WC  . darbepoetin (ARANESP) injection - DIALYSIS  60 mcg Intravenous Q Wed-HD  . doxazosin  2 mg Oral Daily  . heparin  5,000 Units Subcutaneous 3 times per day  . insulin aspart  0-9 Units Subcutaneous TID WC  . insulin glargine  3 Units Subcutaneous  Daily  . lacosamide (VIMPAT) IV  50 mg Intravenous Q12H  . pantoprazole (PROTONIX) IV  40 mg Intravenous Q24H  . pravastatin  40 mg Oral q1800  . regadenoson  0.4 mg Intravenous Once  . sevelamer carbonate  800 mg Oral TID WC  . sodium chloride  3 mL Intravenous Q12H  . valproate sodium  750 mg Intravenous Q12H   Dialysis Orders: MWF [facility] 4 hrs 70.5kgs 2K/2.5CA L IJ Cath (does not have any perm access) Heparin none Micera 75q 2 weeks Calcitriol 0.25   Assessment/ Plan:    Assessment/Plan: 1. Seizures (new) / bilat acute CVA's - on depakote and vimpat, EEG + but showed some improvement on 10/10. New acute scattered CVA's by MRI. Cardiology suspects intermittent afib. LE doppler no DVT. TEE cancelled today. 1. Her mental status waxes and wanes, not sure if this is medication related or due to ongoing seizure activity or progression of her stokes.  Await neurology input. 2. AMS - unclear if this is related to seizure meds, ongoing seizures, or progression of cva, bp normal but still with AMS.  Await  neuro eval. 3. Elevated troponin- cardiology following. ECHO EF 50-55% 4. ESRD - MWF [place]. HD- Below dry wt. No dialysis today per husbands request/refusal. 5. Hypertension/volume - BP's variable SBP 130-190s- symptomatic with BP drop- runs 180s at home. 3 kg under dry wt No UF w HD for now.  6. Anemia - hgb 9.6- on micera outpt- start Aranesp here 7. Metabolic bone disease - Corr ca 9.5 Cont renvela. And calcitriol per outpt orders 8. Nutrition - dys diet. alb 2.7 9. DM- per primary Jyron Turman A 07/21/2015, 10:52 AM

## 2015-07-21 NOTE — Evaluation (Signed)
Speech Language Pathology Evaluation Patient Details Name: Gail Chapman MRN: [id number] DOB: 09-07-1956 Today's Date: 07/21/2015 Time: 7253-6644 SLP Time Calculation (min) (ACUTE ONLY): 16 min  Problem List:  Patient Active Problem List   Diagnosis Date Noted  . Stroke with cerebral ischemia (HCC)   . Cerebral embolism with cerebral infarction 07/16/2015  . Cerebral thrombosis with cerebral infarction 07/16/2015  . Seizures (HCC) 07/13/2015  . Elevated troponin 07/13/2015  . MS (multiple sclerosis) (HCC) 07/13/2015  . ESRD on dialysis (HCC) 07/13/2015  . Acute on chronic diastolic heart failure (HCC) 07/13/2015  . COPD (chronic obstructive pulmonary disease) (HCC)   . Acute encephalopathy 07/12/2015  . Pericardial effusion 05/05/2015  . ESRD (end stage renal disease) (HCC) 11/24/2013  . Chronic kidney disease, stage 3 08/01/2013  . Pre-operative cardiovascular examination 06/11/2013  . Altered mental status 02/26/2013  . Acute on chronic renal failure (HCC) 02/24/2013  . Hypertensive crisis 02/19/2013  . Chronic renal failure 02/19/2013  . Acute respiratory failure (HCC) 02/19/2013  . Physical deconditioning   . OSA (obstructive sleep apnea)   . Hypertensive emergency   . Sleep apnea   . Pulmonary hypertension (HCC)   . Chronic diastolic CHF (congestive heart failure) (HCC)   . Diabetes mellitus with renal complications [facility])    Past Medical History:  Past Medical History  Diagnosis Date  . Diabetes mellitus without complication (HCC)   . Chronic diastolic CHF (congestive heart failure) (HCC)     a. 01/2007 MV: No isch/infarct, nl EF;  b. 2012 Cath: "normal" per patient.  Performed in [place], [place] by Dr. Graciela Husbands.  . Pulmonary hypertension (HCC)     a. on home O2 @ 3lpm 24hrs/day  . HTN (hypertension)     a. Dx in 1993.  Marland Kitchen LVH (left ventricular hypertrophy)   . OSA (obstructive sleep apnea)     a. uses CPAP  . Physical deconditioning   . Renal disorder   . COPD  (chronic obstructive pulmonary disease) (HCC)    Past Surgical History:  Past Surgical History  Procedure Laterality Date  . Knee surgery    . Carpal tunnel release    . Tubal ligation    . Cholecystectomy    . Mass excision      a. under axilla.  . Subxyphoid pericardial window N/A 02/26/2013    Procedure: SUBXYPHOID PERICARDIAL WINDOW;  Surgeon: Delight Ovens, MD;  Location: [facility] OR;  Service: Thoracic;  Laterality: N/A;  . Left and right heart catheterization with coronary angiogram N/A 02/25/2013    Procedure: LEFT AND RIGHT HEART CATHETERIZATION WITH CORONARY ANGIOGRAM;  Surgeon: Dolores Patty, MD;  Location: [facility] CATH LAB;  Service: Cardiovascular;  Laterality: N/A;   HPI:  59 y.o. female with PMH of MS, chronic bilateral leg weakness (using wheelchair), hypertension, hyperlipidemia, diabetes mellitus, diastolic congestive heart failure, pulmonary hypertension, OSA on CPAP, COPD on 3 L oxygen at home, and end-stage renal disease-HD (MWF), who presents with altered mental status, dizziness, seizure.MRI on 10/6 revealed acute infarcts in occipital love and frontal lobes bilaterally as well as chronic hemorrhage in brainstem and L temporal lobe due to chronic HTN.    Assessment / Plan / Recommendation Clinical Impression   Pt presents with significant cognitive deficits characterized by delayed processing speed, decreased alertness, and decreased focused attention to tasks which impact all higher level cognitive processes.  Pt was oriented to place and situation but was not oriented to date.  Pt's speech is clear but her responses  are significantly delayed and perseverative at times.  SLP recommends ongoing diagnostic treatment of cognition s/p infarcts as pt's mentation clears from medication effects.  Anticipate that pt will require 24/7 supervision and ongoing ST services at next level of care   SLP Assessment  Patient needs continued Speech Lanaguage Pathology Services    Follow  Up Recommendations  Skilled Nursing facility;24 hour supervision/assistance    Frequency and Duration min 2x/week      Pertinent Vitals/Pain Pain Assessment: No/denies pain   SLP Goals  Potential to Achieve Goals (ACUTE ONLY): Good  SLP Evaluation Prior Functioning  Cognitive/Linguistic Baseline: Information not available  Lives With: Spouse Available Help at Discharge: Family   Cognition  Overall Cognitive Status: Impaired/Different from baseline Arousal/Alertness: Lethargic Orientation Level: Oriented to person;Oriented to place;Oriented to situation;Disoriented to time Attention: Focused Focused Attention: Impaired Focused Attention Impairment: Verbal basic;Functional basic Memory: Impaired Memory Impairment: Storage deficit;Retrieval deficit Problem Solving: Impaired Problem Solving Impairment: Verbal basic;Functional basic Executive Function:  (all impaired due to lower level deficits) Behaviors: Perseveration Safety/Judgment: Impaired    Comprehension  Auditory Comprehension Overall Auditory Comprehension: Other (comment) (impacted by cognition )    Expression Verbal Expression Overall Verbal Expression: Other (comment) (impacted by cognition )   Oral / Motor Oral Motor/Sensory Function Overall Oral Motor/Sensory Function: Appears within functional limits for tasks assessed (limited due to cognitive deficits) Motor Speech Overall Motor Speech: Appears within functional limits for tasks assessed   GO     PageMelanee Spry 07/21/2015, 4:24 PM

## 2015-07-22 DIAGNOSIS — I63133 Cerebral infarction due to embolism of bilateral carotid arteries: Secondary | ICD-10-CM

## 2015-07-22 DIAGNOSIS — G35 Multiple sclerosis: Secondary | ICD-10-CM

## 2015-07-22 DIAGNOSIS — R269 Unspecified abnormalities of gait and mobility: Secondary | ICD-10-CM

## 2015-07-22 DIAGNOSIS — J438 Other emphysema: Secondary | ICD-10-CM

## 2015-07-22 DIAGNOSIS — I69398 Other sequelae of cerebral infarction: Secondary | ICD-10-CM

## 2015-07-22 LAB — URINE MICROSCOPIC-ADD ON

## 2015-07-22 LAB — GLUCOSE, CAPILLARY
Glucose-Capillary: 76 mg/dL (ref 65–99)
Glucose-Capillary: 117 mg/dL — ABNORMAL HIGH (ref 65–99)
Glucose-Capillary: 94 mg/dL (ref 65–99)
Glucose-Capillary: 103 mg/dL — ABNORMAL HIGH (ref 65–99)

## 2015-07-22 LAB — BASIC METABOLIC PANEL
Anion gap: 17 — ABNORMAL HIGH (ref 5–15)
BUN: 48 mg/dL — AB (ref 6–20)
CHLORIDE: 98 mmol/L — AB (ref 101–111)
CO2: 23 mmol/L (ref 22–32)
Calcium: 9.1 mg/dL (ref 8.9–10.3)
Creatinine, Ser: 6.52 mg/dL — ABNORMAL HIGH (ref 0.44–1.00)
GFR calc Af Amer: 7 mL/min — ABNORMAL LOW (ref >60)
GFR calc non Af Amer: 6 mL/min — ABNORMAL LOW (ref >60)
GLUCOSE: 67 mg/dL (ref 65–99)
POTASSIUM: 4.7 mmol/L (ref 3.5–5.1)
Sodium: 138 mmol/L (ref 135–145)

## 2015-07-22 LAB — URINALYSIS, ROUTINE W REFLEX MICROSCOPIC
Bilirubin Urine: NEGATIVE
GLUCOSE, UA: NEGATIVE mg/dL
Ketones, ur: 15 mg/dL — AB
Nitrite: NEGATIVE
Specific Gravity, Urine: 1.012 (ref 1.005–1.030)
Urobilinogen, UA: 0.2 mg/dL (ref 0.0–1.0)
pH: 6.5 (ref 5.0–8.0)

## 2015-07-22 LAB — CBC
HEMATOCRIT: 28.5 % — AB (ref 36.0–46.0)
Hemoglobin: 9.4 g/dL — ABNORMAL LOW (ref 12.0–15.0)
MCH: 28.7 pg (ref 26.0–34.0)
MCHC: 33 g/dL (ref 30.0–36.0)
MCV: 86.9 fL (ref 78.0–100.0)
Platelets: 161 10*3/uL (ref 150–400)
RBC: 3.28 MIL/uL — ABNORMAL LOW (ref 3.87–5.11)
RDW: 15.9 % — AB (ref 11.5–15.5)
WBC: 9.6 10*3/uL (ref 4.0–10.5)

## 2015-07-22 MED ORDER — LACOSAMIDE 50 MG PO TABS
50.0000 mg | ORAL_TABLET | Freq: Two times a day (BID) | ORAL | Status: DC
  Filled 2015-07-22 (×2): qty 1

## 2015-07-22 NOTE — Progress Notes (Signed)
Occupational Therapy Treatment Patient Details Name: Gail Chapman MRN: [id number] DOB: Jun 09, 1956 Today's Date: 07/22/2015    History of present illness Pt is a 59 y/o female with a PMH of DM, uncontrolled HTN, CHF, COPD, ESRD (MWF), who initially presented 10/3 with confusion and probable seizure. On 10/5 after HD she had episode of AMS with acute HTN with SBP 200. MRI on 10/6 revealed acute infarcts in occipital love and frontal lobes bilaterally as well as chronic hemorrhage in brainstem and L temporal lobe due to chronic HTN.    OT comments  Pt with more frequent and longer periods of alertness today per husband.  Husband attributes this to a change in her medication.  Pt oriented to self, place and husband and following one step commands with increased time and physical assist to initiate. One person assist max assist for bed mobility.  Sat EOB x 12 minutes.  Balance remains poor with pt reliant on at least one hand to remain upright. Husband hopeful that pt will continue to progress in level of alertness and ability to participate after she has dialysis tomorrow.   Follow Up Recommendations  SNF;Supervision/Assistance - 24 hour (husband prefers inpatient rehab)    Equipment Recommendations  Wheelchair (measurements OT);Wheelchair cushion (measurements OT);Hospital bed    Recommendations for Other Services      Precautions / Restrictions Precautions Precautions: Fall       Mobility Bed Mobility Overal bed mobility: Needs Assistance Bed Mobility: Rolling;Sidelying to Sit;Sit to Supine Rolling: Max assist Sidelying to sit: Max assist   Sit to supine: Max assist   General bed mobility comments: use of rail, assist for trunk and LEs using pad, total assist scoot hips to EOB  Transfers                      Balance Overall balance assessment: Needs assistance Sitting-balance support: Feet supported Sitting balance-Leahy Scale: Poor   Postural control: Left lateral  lean                         ADL Overall ADL's : Needs assistance/impaired Eating/Feeding: Total assistance;Sitting Eating/Feeding Details (indicate cue type and reason): worked on bringing cup to mouth, husband reports feeding her this morning Grooming: Wash/dry hands;Wash/dry face;Moderate assistance;Bed level Grooming Details (indicate cue type and reason): unable to balance at EOB for grooming, performed at bed level                               General ADL Comments: Worked on sitting balance and trunk strength at EOB, pt with flexed trunk, but responded favorable to faciltation for extension of spine and shoulder retraction.  Dependent on one hand on bed or rail for balance while reaching with the opposite in moderate wide ranges.      Vision                 Additional Comments: Pt wears glasses, can see gross objects and shapes per observation.  Could not see small print at baseline.   Perception     Praxis      Cognition   Behavior During Therapy: Flat affect Overall Cognitive Status: Impaired/Different from baseline   Orientation Level: Disoriented to;Time Current Attention Level: Sustained Memory: Decreased short-term memory  Following Commands: Follows one step commands with increased time Safety/Judgement: Decreased awareness of deficits   Problem Solving: Slow processing;Decreased initiation;Requires verbal  cues General Comments: pt more awake this visit, but per husband she waxes and wanes, went to sleep after 25 minutes of activity    Extremity/Trunk Assessment               Exercises     Shoulder Instructions       General Comments      Pertinent Vitals/ Pain       Pain Assessment: No/denies pain  Home Living                                          Prior Functioning/Environment              Frequency Min 2X/week     Progress Toward Goals  OT Goals(current goals can now be found in  the care plan section)  Progress towards OT goals: Progressing toward goals  Acute Rehab OT Goals Patient Stated Goal: go home  Plan Discharge plan remains appropriate    Co-evaluation                 End of Session Equipment Utilized During Treatment: Oxygen (2L)   Activity Tolerance Patient limited by fatigue   Patient Left in bed;with call bell/phone within reach;with family/visitor present;with nursing/sitter in room   Nurse Communication          Time: 4782-9562 OT Time Calculation (min): 27 min  Charges: OT General Charges $OT Visit: 1 Procedure OT Treatments $Self Care/Home Management : 8-22 mins $Therapeutic Activity: 8-22 mins  Evern Bio 07/22/2015, 10:08 AM  [phone]

## 2015-07-22 NOTE — Progress Notes (Signed)
Patient ID: Mee Hives, female   DOB: Jan 03, 1956, 59 y.o.   MRN: [id number] S:more awake/alert this am O:BP 148/52 mmHg  Pulse 68  Temp(Src) 97.7 F (36.5 C) (Oral)  Resp 16  Ht 5' 1.5" (1.562 m)  Wt 66.316 kg (146 lb 3.2 oz)  BMI 27.18 kg/m2  SpO2 96%  Intake/Output Summary (Last 24 hours) at 07/22/15 0808 Last data filed at 07/22/15 0500  Gross per 24 hour  Intake    180 ml  Output    302 ml  Net   -122 ml   Intake/Output: I/O last 3 completed shifts: In: 180 [P.O.:180] Out: 303 [Urine:300; Stool:3]  Intake/Output this shift:    Weight change:  Gen:WD WN AAF in NAD CVS:no rub Resp:cta WUJ:WJXBJY Ext:no edema   Recent Labs Lab 07/17/15 0224 07/18/15 0133 07/19/15 0508 07/19/15 0650 07/20/15 0615 07/21/15 0452 07/22/15 0620  NA 133* 134* 134* 133* 135 134* 138  K 4.3 4.7 5.2* 5.3* 4.2 4.7 4.7  CL 99* 98* 96* 97* 97* 94* 98*  CO2 GLUCOSE 87 105* 91 95 85 84 67  BUN 21* 35* 57* 59* 24* 36* 48*  CREATININE 3.85* 5.25* 6.60* 6.50* 3.89* 5.14* 6.52*  ALBUMIN  --  2.7*  --  2.7*  --   --   --   CALCIUM 8.5* 8.5* 8.4* 8.3* 8.4* 9.1 9.1  PHOS  --  6.5*  --  7.4*  --   --   --    Liver Function Tests:  Recent Labs Lab 07/18/15 0133 07/19/15 0650  ALBUMIN 2.7* 2.7*   No results for input(s): LIPASE, AMYLASE in the last 168 hours.  Recent Labs Lab 07/20/15 0710  AMMONIA 33   CBC:  Recent Labs Lab 07/19/15 0508 07/19/15 0650 07/20/15 0615 07/21/15 0452 07/22/15 0620  WBC 5.5 6.2 5.3 7.8 9.6  HGB 9.7* 9.3* 9.6* 10.2* 9.4*  HCT 31.5* 29.7* 31.6* 31.7* 28.5*  MCV 89.0 89.2 89.8 88.8 86.9  PLT 174 170 173 177 161   Cardiac Enzymes:  Recent Labs Lab 07/15/15 0834 07/15/15 1638 07/15/15 1942  TROPONINI 0.06* 0.06* 0.05*   CBG:  Recent Labs Lab 07/20/15 2101 07/21/15 0800 07/21/15 1132 07/21/15 1657 07/22/15 0753  GLUCAP 83 80 88 89 76    Iron Studies: No results for input(s): IRON, TIBC, TRANSFERRIN,  FERRITIN in the last 72 hours. Studies/Results: No results found. Marland Kitchen antiseptic oral rinse  7 mL Mouth Rinse BID  . aspirin EC  325 mg Oral Daily  . calcitRIOL  0.25 mcg Oral Q M,W,F-HD  . carvedilol  12.5 mg Oral BID WC  . darbepoetin (ARANESP) injection - DIALYSIS  60 mcg Intravenous Q Wed-HD  . divalproex  750 mg Oral Q12H  . doxazosin  2 mg Oral Daily  . heparin  5,000 Units Subcutaneous 3 times per day  . insulin aspart  0-9 Units Subcutaneous TID WC  . insulin glargine  3 Units Subcutaneous Daily  . lacosamide (VIMPAT) IV  50 mg Intravenous Q12H  . pantoprazole (PROTONIX) IV  40 mg Intravenous Q24H  . piperacillin-tazobactam (ZOSYN)  IV  2.25 g Intravenous 3 times per day  . regadenoson  0.4 mg Intravenous Once  . sevelamer carbonate  800 mg Oral TID WC  . sodium chloride  3 mL Intravenous Q12H    BMET    Component Value Date/Time   NA 138 07/22/2015 0620   K 4.7 07/22/2015 7829  CL 98* 07/22/2015 0620   CO2 23 07/22/2015 0620   GLUCOSE 67 07/22/2015 0620   BUN 48* 07/22/2015 0620   CREATININE 6.52* 07/22/2015 0620   CALCIUM 9.1 07/22/2015 0620   GFRNONAA 6* 07/22/2015 0620   GFRAA 7* 07/22/2015 0620   CBC    Component Value Date/Time   WBC 9.6 07/22/2015 0620   RBC 3.28* 07/22/2015 0620   HGB 9.4* 07/22/2015 0620   HCT 28.5* 07/22/2015 0620   PLT 161 07/22/2015 0620   MCV 86.9 07/22/2015 0620   MCH 28.7 07/22/2015 0620   MCHC 33.0 07/22/2015 0620   RDW 15.9* 07/22/2015 0620   LYMPHSABS 0.7 08/28/2014 2224   MONOABS 1.1* 08/28/2014 2224   EOSABS 0.1 08/28/2014 2224   BASOSABS 0.0 08/28/2014 2224    Dialysis Orders: MWF [facility] 4 hrs 70.5kgs 2K/2.5CA L IJ Cath (does not have any perm access) Heparin none Micera 75q 2 weeks Calcitriol 0.25   Assessment/ Plan:    Assessment/Plan: 1. Seizures (new) / bilat acute CVA's - on depakote and vimpat, EEG + but showed some improvement on 10/10. New acute scattered CVA's by MRI. Cardiology  suspects intermittent afib. LE doppler no DVT. TEE cancelled but improved clinically so reschedule per cardiology. 1. Her mental status improved after changing to po depakote  2. Appreciate neurology input. 2. AMS - unclear if this is related to seizure meds, ongoing seizures, or progression of cva, bp normal but still with AMS. Neuro switched to po depakote with some improvement this morning.  Continue to eval. 3. Elevated troponin- cardiology following. ECHO EF 50-55% 4. ESRD - MWF [place]. HD- Below dry wt. No dialysis until Friday per husbands request/refusal. 5. Hypertension/volume - BP's variable SBP 130-190s- symptomatic with BP drop- runs 180s at home. 3 kg under dry wt No UF w HD for now.  6. Anemia - hgb 9.6- on micera outpt- start Aranesp here 7. Metabolic bone disease - Corr ca 9.5 Cont renvela. And calcitriol per outpt orders 8. Vascular access - using PC, no permanent access.  Will defer to her primary nephrologist in [place]. 9. Nutrition - dys diet. alb 2.7 10. DM- per primary       Gail Chapman

## 2015-07-22 NOTE — Progress Notes (Signed)
STROKE TEAM PROGRESS NOTE   SUBJECTIVE (INTERVAL HISTORY) Husband at bedside. Pt doing better today, able to pick up the bottle and drink by her own. Cardiology plans to do TEE and loop in am. Will also plan to do EEG tomorrow for seizure follow up.    OBJECTIVE Temp:  [97.7 F (36.5 C)-101.9 F (38.8 C)] 98.1 F (36.7 C) (10/13 0831) Pulse Rate:  [60-104] 60 (10/13 0831) Cardiac Rhythm:  [-] Sinus bradycardia (10/13 0703) Resp:  [16-18] 18 (10/13 0831) BP: (140-183)/(52-90) 153/59 mmHg (10/13 0831) SpO2:  [95 %-100 %] 98 % (10/13 0831) Weight:  [66.316 kg (146 lb 3.2 oz)] 66.316 kg (146 lb 3.2 oz) (10/12 2100)  CBC:   Recent Labs Lab 07/21/15 0452 07/22/15 0620  WBC 7.8 9.6  HGB 10.2* 9.4*  HCT 31.7* 28.5*  MCV 88.8 86.9  PLT 177 161    Basic Metabolic Panel:   Recent Labs Lab 07/18/15 0133  07/19/15 0650  07/21/15 0452 07/22/15 0620  NA 134*  < > 133*  < > 134* 138  K 4.7  < > 5.3*  < > 4.7 4.7  CL 98*  < > 97*  < > 94* 98*  CO2 23  < > 24  < > 24 23  GLUCOSE 105*  < > 95  < > 84 67  BUN 35*  < > 59*  < > 36* 48*  CREATININE 5.25*  < > 6.50*  < > 5.14* 6.52*  CALCIUM 8.5*  < > 8.3*  < > 9.1 9.1  PHOS 6.5*  --  7.4*  --   --   --   < > = values in this interval not displayed.  IMAGING  Ct Head Wo Contrast 07/14/2015    Atrophy with extensive periventricular small vessel disease. Age uncertain small infarcts at several sites as described. No hemorrhage or mass effect. Mild ethmoid sinus disease bilaterally.     Mr Brain Wo Contrast 07/15/2015    Atrophy and moderate to advanced chronic ischemia. Chronic hemorrhage in the brainstem and left temporal lobe due to chronic hypertension  Acute infarcts in the occipital lobe and frontal lobes bilaterally.     2D Echocardiogram   - Left ventricle: The cavity size was normal. There was severeconcentric hypertrophy. Systolic function was normal. Theestimated ejection fraction was in the range of 50% to 55%. Thereis  akinesis of the basal-midinferior myocardium. Dopplerparameters are consistent with a reversible restrictive pattern,indicative of decreased left ventricular diastolic complianceand/or increased left atrial pressure (grade 3 diastolicdysfunction). - Mitral valve: Severely calcified annulus. There was moderateregurgitation. - Left atrium: The atrium was moderately dilated. - Right ventricle: The cavity size was mildly dilated. Wallthickness was normal. - Pulmonary arteries: Systolic pressure was mildly increased. PApeak pressure: 43 mm Hg (S). - Pericardium, extracardiac: A small to moderate pericardialeffusion was identified posterior to the heart (1.5cm). Featureswere not consistent with tamponade physiology.  EEG 07/13/15 This is an abnormal EEG secondary to focal sharp wave activity in the frontal region on the left with phase reversal at F7. This finding is suggestive of a focal disturbance with epileptogenic potential.   EEG 07/14/15 This is an abnormal EEG secondary to focal sharp wave activity in the frontal regions bilaterally (left more than right) with phase reversal at [facility] most predominantly. This finding is suggestive of a focal disturbance with epileptogenic potential.  EEG 07/19/15 - 1) bilateral independent parietooccipital sharp waves. 2) Generalized irregular delta activity occurring in bursts. Clinical Interpretation: This EEG  is consistent with bilateral areas of potential epileptogenicity in the parietooccipital regions in the setting of a generalized non-specific cerebral dysfunction(encephalopathy). In comparison to the previous study, there has been improvement in the frontotemporal sharp waves, but persistence of the parieto-occipital sharps.  Carotid US - Bilateral: 1-39% ICA stenosis. Vertebral artery flow is antegrade.  LE venous doppler - no DVT  TCD Low normal mean flow velocities in majority of identified vessels of anterior and posterior circulation due to  suboptimal windows and suspect diffuse intracranial atherosclerosis given globally elevated pulsatility indexes.  TEE pending  Component     Latest Ref Rng 07/19/2015 07/20/2015 07/21/2015  Valproic Acid Lvl     50.0 - 100.0 ug/mL 77 58 67  Ammonia     9 - 35 umol/L  33     PHYSICAL EXAM Middle-aged obese African-American lady not in distress. Sitting in chair, mildly restless. Afebrile. Head is nontraumatic. Neck is supple without bruit.  Cardiac exam no murmur or gallop. Lungs are clear to auscultation. Distal pulses are well felt.  Neurological Exam :  AAOx2, not orientated to time. Fund of knowledge impaired not knowing current president. Diminished attention, registration and recall. No aphasia or apraxia dysarthria, able to name and repeat. But psychomotor slowing. Eye movements are full range without nystagmus. Blinks to threat bilaterally. Fundi were not visualized. Vision acuity and fields seem adequate. Face is symmetric without weakness. Tongue is midline. Motor system exam reveals no upper or lower extremity drift. Sensation seems preserved bilaterally. Coordination is slow but accurate. Gait was not tested. Deep tendon reflexes are symmetric. Plantars are downgoing.     ASSESSMENT/PLAN Gail Chapman is a 59 y.o. female with history of MS, chronic bilateral leg weakness (using wheelchair), hypertension, hyperlipidemia, diabetes mellitus, diastolic congestive heart failure, pulmonary hypertension, OSA on CPAP, COPD on 3 L oxygen at home, and end-stage renal disease-HD (MWF), who was transferred from [facility] with altered mental status, dizziness, seizure. MRI found stroke during admission.   Stroke: occipital lobe and bilateral frontal lobe small infarcts, embolic pattern secondary to unknown source, hypoperfusion due to hypotension vs. Afib. Pt had hypotensive episodes correlating the time period of infarct. Most of punctate infarct support watershed distribution except one  in the right MCA territory.   Resultant no significant focal deficit, but psychomotor slowing  MRI  occipital lobe and bilateral frontal lobe small infarcts  TCD diffuse athero  Carotid Doppler  1-39% bilateral stenosis  2D Echo  No source of embolus    LE venous doppler negative for DVT  Plans for TEE Fri per cardiology and potential loop recorder  Heparin 5000 units sq tid for VTE prophylaxis Diet renal with fluid restriction Fluid restriction:: 1200 mL Fluid; Room service appropriate?: Yes; Fluid consistency:: Thin  aspirin 325 mg orally every day prior to admission, now on aspirin 325 mg orally every day.   Ongoing aggressive stroke risk factor management  Therapy recommendations:  CIR recommended. Consult ordered. Husband wants to take home.  Disposition:  pending  Seizure  On depakote  EEG positive 10/4 & 10/5   EEG 10/10 showed improvement on frontal sharps but continued parietooccipital sharps with cerebral dysfunction.  depakote level 77 -> 55 -> 67   vimpat added 07/19/2015 for seizure control  Continue depakote to 750 mg bid.   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  BP 158-187/58-82 past 24h (07/22/2015 @ 9:39 AM)  Stable currently  BP goal normotensive  Hyperlipidemia  Home meds:  No statin  LDL 93, goal < 70  New pravachol 40 added this admission  Continue statin at discharge  Diabetes type II  HgbA1c 5.9, at goal < 7.0  Controlled  Other Stroke Risk Factors  Former Cigarette smoker  Coronary artery disease  Elevated troponin - card consult - think demand ischemia in setting of CVA, HTN, and seizures  Obstructive sleep apnea, on CPAP at home, 3L O2  Other Active Problems  ESRD on HD MWF, HD on hold for today  Anemia of chronic disease  Metabolic bone disease  COPD  Chronic diastolic heart failure  Multiple sclerosis w/ chronic B leg weakness, uses w/c  Pulmonary hypertension   Pericardial effusion,  chronic, small  Hospital day # 10  Marvel Plan, MD PhD Stroke Neurology 07/22/2015 3:24 PM    To contact Stroke Continuity provider, please refer to [email or web address]. After hours, contact General Neurology

## 2015-07-22 NOTE — Consult Note (Signed)
Physical Medicine and Rehabilitation Consult  Reason for Consult: Weakness and cognitive deficits after seizures and embolic strokes.  Referring Physician: Dr. Roda Shutters   HPI: Gail Chapman is a 59 y.o. RH-female with history of HTN, DM, diastolic CHF, COPD-oxygen dependent, MS with BLE weakness-wheelchair bound, ESRD-HD MWF who was admitted on 07/12/15 with generalized seizure and transient confusion.  CT head negative and EEG done revealing focal sharp wave Left frontal region suggestive of focal disturbance with epileptogenic potential.  She was placed on Depakote for seizures per neurology input. 2D echo with EF 50-55% with akinesis of basal-midinferior myocardium, grade 3 diastolic dysfunction moderate MVR and small to moderate pericardial effusion--no tamponade. Cardiology consulted for input and felt that elevated cardiac enzymes likely due to CHF. Blood pressures have been labile and treated with cardene drip. She had decrease in MS with confusion after dialysis on 10/05 and  MRI/MRA brain done revealing acute infarcts in bilateral occipital and frontal lobes and chronic hemorrhage in brain stem and left temporal lobes due to HTN.  Dr. Roda Shutters recommended ASA for embolic stroke question due to hypotension v/s AFib.  She has had persistent lethargy and repeat EED showed improvement in frontal sharps but continued parieto-oocipital sharp waves therefore Vimpat added for seizure control. She has had N/V and TEE deferred till symptoms resolved. Zosyn and Daptomycin was added for fevers and patient continues to have bouts of lethargy.  For TEE today? Continue to have lethargy and husband has refused dialysis today due to fatigue.   Therapy ongoing and patient with resultant weakness as well as cognitive deficits affecting mobility.  Family and PT requesting CIR for follow up therapy.       Husband at bedside. He states that at home she could stand while he helped to wash and dress her. Currently she is  unable to stand without assistance.  Review of Systems  Constitutional: Positive for malaise/fatigue.  HENT: Negative for hearing loss.   Eyes: Positive for blurred vision (Poor vision due to MS. ). Negative for pain.  Respiratory: Negative for cough and shortness of breath.   Cardiovascular: Negative for chest pain and palpitations.  Gastrointestinal: Positive for blood in stool. Negative for nausea, vomiting and abdominal pain.  Genitourinary: Negative for dysuria and frequency.  Musculoskeletal: Negative for myalgias and back pain.  Skin: Negative for rash.  Neurological: Positive for weakness. Negative for dizziness, tingling and headaches.  Psychiatric/Behavioral: Positive for memory loss.      Past Medical History  Diagnosis Date  . Diabetes mellitus without complication (HCC)   . Chronic diastolic CHF (congestive heart failure) (HCC)     a. 01/2007 MV: No isch/infarct, nl EF;  b. 2012 Cath: "normal" per patient.  Performed in [place], [place] by Dr. Graciela Husbands.  . Pulmonary hypertension (HCC)     a. on home O2 @ 3lpm 24hrs/day  . HTN (hypertension)     a. Dx in 1993.  Marland Kitchen LVH (left ventricular hypertrophy)   . OSA (obstructive sleep apnea)     a. uses CPAP  . Physical deconditioning   . Renal disorder   . COPD (chronic obstructive pulmonary disease) [facility])     Past Surgical History  Procedure Laterality Date  . Knee surgery    . Carpal tunnel release    . Tubal ligation    . Cholecystectomy    . Mass excision      a. under axilla.  . Subxyphoid pericardial window N/A 02/26/2013  Procedure: SUBXYPHOID PERICARDIAL WINDOW;  Surgeon: Delight Ovens, MD;  Location: [facility] OR;  Service: Thoracic;  Laterality: N/A;  . Left and right heart catheterization with coronary angiogram N/A 02/25/2013    Procedure: LEFT AND RIGHT HEART CATHETERIZATION WITH CORONARY ANGIOGRAM;  Surgeon: Dolores Patty, MD;  Location: [facility] CATH LAB;  Service: Cardiovascular;  Laterality: N/A;    Family  History  Problem Relation Age of Onset  . Diabetes Mother   . CAD Mother   . Cancer Mother   . Hypertension Mother   . Cancer Father   . Diabetes Father   . Hyperlipidemia Father   . Hypertension Father   . Heart attack Father      Social History:  Married. 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   Husband at home at this time and her sister has move in to assist.  She was to ambulate with walker and required assistance at times. Needed assistance with transfers and ith ADLs.   She eports that she has quit smoking 35 years ago--smoked for 2 years. She does not have any smokeless tobacco history on file. She reports that she does not drink alcohol or use illicit drugs.    Allergies  Allergen Reactions  . Clonidine Derivatives     Hand itching  . Hydralazine     Visual disturbances   . Labetalol     Fatigue   . Vancomycin     "shut down my kidneys"    Medications Prior to Admission  Medication Sig Dispense Refill  . aspirin EC 325 MG EC tablet Take 1 tablet (325 mg total) by mouth daily. 30 tablet 3  . carvedilol (COREG) 12.5 MG tablet Take 1 tablet (12.5 mg total) by mouth 2 (two) times daily with a meal. On takes on non dialysis day. 60 tablet 3  . doxazosin (CARDURA) 2 MG tablet Take 1 tablet (2 mg total) by mouth daily. 30 tablet 6  . insulin aspart (NOVOLOG) 100 UNIT/ML injection Inject 10 Units into the skin 3 (three) times daily with meals. Per sliding scale 10 mL 0  . insulin detemir (LEVEMIR) 100 UNIT/ML injection Inject 0.04 mLs (4 Units total) into the skin at bedtime. Hold if CBG<150 10 mL 0  . glucose blood (FREESTYLE LITE) test strip Use as instructed (Patient not taking: Reported on 05/05/2015) 100 each 12  . promethazine (PHENERGAN) 25 MG suppository Place 1 suppository (25 mg total) rectally at bedtime. (Patient not taking: Reported on 05/05/2015) 12 each 3    Home: Home Living Family/patient expects to be discharged to:: Skilled nursing facility Living  Arrangements: Spouse/significant other Available Help at Discharge: Family  Lives With: Spouse  Functional History: Prior Function Level of Independence: Needs assistance Gait / Transfers Assistance Needed: pt walked with walker PTA and used w/c at times due to MS ADL's / Homemaking Assistance Needed: husband cooked and cleaned, bathed pt, dressed pt, helped pt toilet.  Pt only got sponge bathes. Comments: Pt not following many commands. Husband answering most questions. Functional Status:  Mobility: Bed Mobility Overal bed mobility: Needs Assistance Bed Mobility: Supine to Sit Supine to sit: Max assist, +2 for physical assistance, HOB elevated General bed mobility comments: unable to follow simple one step command  to assist in transfer, max directional verbal and tactile cues to complete task Transfers Overall transfer level: Needs assistance Equipment used:  (2 person lift with gait belt and bed pad) Transfers: Sit to/from Stand Sit to Stand: Max  assist, +2 physical assistance Stand pivot transfers: Total assist, +2 physical assistance Squat pivot transfers: Total assist, +2 physical assistance General transfer comment: pt didn't initiate transfer, did not advance LEs without max assist Ambulation/Gait General Gait Details: unable    ADL: ADL Overall ADL's : Needs assistance/impaired Eating/Feeding: Total assistance, Sitting Eating/Feeding Details (indicate cue type and reason): no initiation or follow through.  Pt will stop task and needs max cues to keep going with all adls. Grooming: Wash/dry face, Wash/dry hands, Maximal assistance Grooming Details (indicate cue type and reason): started with hand over hand.  Pt could continue after hand over hand taken away for 1-3 seconds and then stops. Upper Body Bathing: Total assistance Lower Body Bathing: Total assistance Upper Body Dressing : Total assistance Lower Body Dressing: Total assistance Toilet Transfer: +2 for physical  assistance, Total assistance, Stand-pivot, BSC Toilet Transfer Details (indicate cue type and reason): pt not initiating moving own feet during transfers. Toileting- Clothing Manipulation and Hygiene: +2 for physical assistance, Total assistance, Sit to/from stand Functional mobility during ADLs: Total assistance, +2 for physical assistance General ADL Comments: Pt with very poor initation and follow through and not following consistent commands therefore, at this stage, is dependent with most adls.  Husband did assist pt with bathing, dressing and toileting PTA.  Cognition: Cognition Overall Cognitive Status: Impaired/Different from baseline Arousal/Alertness: Lethargic Orientation Level: Oriented to person, Oriented to place, Oriented to situation, Oriented to time Attention: Focused Focused Attention: Impaired Focused Attention Impairment: Verbal basic, Functional basic Memory: Impaired Memory Impairment: Storage deficit, Retrieval deficit Problem Solving: Impaired Problem Solving Impairment: Verbal basic, Functional basic Executive Function:  (all impaired due to lower level deficits) Behaviors: Perseveration Safety/Judgment: Impaired Cognition Arousal/Alertness: Lethargic Behavior During Therapy: Flat affect Overall Cognitive Status: Impaired/Different from baseline Area of Impairment: Following commands, Safety/judgement, Awareness, Problem solving Orientation Level: Disoriented to, Time Current Attention Level: Focused (due to lethargy) Memory: Decreased recall of precautions, Decreased short-term memory Following Commands: Follows one step commands inconsistently Safety/Judgement: Decreased awareness of safety, Decreased awareness of deficits Awareness: Intellectual Problem Solving: Slow processing, Decreased initiation, Difficulty sequencing, Requires verbal cues, Requires tactile cues General Comments: pt extremely lethargic so unsure if true cognitive deficits or just  lethargic/decreased arousal level   Blood pressure 153/59, pulse 60, temperature 98.1 F (36.7 C), temperature source Oral, resp. rate 18, height 5' 1.5" (1.562 m), weight 66.316 kg (146 lb 3.2 oz), SpO2 98 %. Physical Exam  Constitutional: She is oriented to person, place, and time. She appears well-developed and well-nourished.  Up in chair with tendency to list to the left. Poor postural reflexes.   HENT:  Head: Normocephalic and atraumatic.  Mouth/Throat: Oropharynx is clear and moist.  Eyes: Conjunctivae are normal. Pupils are equal, round, and reactive to light. Right eye exhibits no discharge. Left eye exhibits no discharge.  Neck: Normal range of motion. Neck supple.  Cardiovascular: Normal rate and regular rhythm.   Murmur heard. Respiratory: Effort normal and breath sounds normal. No respiratory distress. She has no wheezes.  GI: Soft. Bowel sounds are normal. She exhibits no distension. There is no tenderness.  Musculoskeletal: She exhibits no edema or tenderness.  Extensor tone BLE tight heel cords.   Neurological: She is alert and oriented to person, place, and time.  Speech clear.  She was fatigued by the end of exam and had increased difficulty following commands. She was able to state age, DOB, city. Was unable to other biographical questions appropriately and had perseverative behaviors/answers by  the end of exam. She was unable to follow simple one step motor commands without multiple cues.   BLE with spatic paraparesis and decrease in light touch.   Skin: Skin is warm and dry.  Psychiatric: Her affect is blunt. Her speech is delayed. She is slowed. She exhibits abnormal recent memory and abnormal remote memory.  Motor strength is 4 minus bilateral deltoid, biceps, triceps, grip 3 minus bilateral hip flexors 4 minus bilateral knee extensors 3 minus bilateral ankle dorsiflexors and plantar flexors Speech without evidence of aphasia Attempted to check visual fields however  she was not able to cooperate due to poor sustained attention  Results for orders placed or performed during the hospital encounter of 07/12/15 (from the past 24 hour(s))  Glucose, capillary     Status: None   Collection Time: 07/21/15 11:32 AM  Result Value Ref Range   Glucose-Capillary 88 65 - 99 mg/dL  Glucose, capillary     Status: None   Collection Time: 07/21/15  4:57 PM  Result Value Ref Range   Glucose-Capillary 89 65 - 99 mg/dL  Urinalysis, Routine w reflex microscopic (not at [facility])     Status: Abnormal   Collection Time: 07/21/15 11:52 PM  Result Value Ref Range   Color, Urine YELLOW YELLOW   APPearance TURBID (A) CLEAR   Specific Gravity, Urine 1.012 1.005 - 1.030   pH 6.5 5.0 - 8.0   Glucose, UA NEGATIVE NEGATIVE mg/dL   Hgb urine dipstick LARGE (A) NEGATIVE   Bilirubin Urine NEGATIVE NEGATIVE   Ketones, ur 15 (A) NEGATIVE mg/dL   Protein, ur >998 (A) NEGATIVE mg/dL   Urobilinogen, UA 0.2 0.0 - 1.0 mg/dL   Nitrite NEGATIVE NEGATIVE   Leukocytes, UA LARGE (A) NEGATIVE  Urine microscopic-add on     Status: Abnormal   Collection Time: 07/21/15 11:52 PM  Result Value Ref Range   WBC, UA TOO NUMEROUS TO COUNT <3 WBC/hpf   RBC / HPF 7-10 <3 RBC/hpf   Bacteria, UA MANY (A) RARE   Urine-Other AMORPHOUS URATES/PHOSPHATES   Basic metabolic panel     Status: Abnormal   Collection Time: 07/22/15  6:20 AM  Result Value Ref Range   Sodium 138 135 - 145 mmol/L   Potassium 4.7 3.5 - 5.1 mmol/L   Chloride 98 (L) 101 - 111 mmol/L   CO2 23 22 - 32 mmol/L   Glucose, Bld 67 65 - 99 mg/dL   BUN 48 (H) 6 - 20 mg/dL   Creatinine, Ser 3.38 (H) 0.44 - 1.00 mg/dL   Calcium 9.1 8.9 - 25.0 mg/dL   GFR calc non Af Amer 6 (L) >60 mL/min   GFR calc Af Amer 7 (L) >60 mL/min   Anion gap 17 (H) 5 - 15  CBC     Status: Abnormal   Collection Time: 07/22/15  6:20 AM  Result Value Ref Range   WBC 9.6 4.0 - 10.5 K/uL   RBC 3.28 (L) 3.87 - 5.11 MIL/uL   Hemoglobin 9.4 (L) 12.0 - 15.0 g/dL    HCT 53.9 (L) 76.7 - 46.0 %   MCV 86.9 78.0 - 100.0 fL   MCH 28.7 26.0 - 34.0 pg   MCHC 33.0 30.0 - 36.0 g/dL   RDW 34.1 (H) 93.7 - 90.2 %   Platelets 161 150 - 400 K/uL  Glucose, capillary     Status: None   Collection Time: 07/22/15  7:53 AM  Result Value Ref Range   Glucose-Capillary 76 65 -  99 mg/dL   No results found.  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 Does the need for close, 24 hr/day medical supervision in concert with the patient's rehab needs make it unreasonable for this patient to be served in a less intensive setting? Yes 2. Co-Morbidities requiring supervision/potential complications: Hypertension, diabetes, COPD, O2 dependent, end-stage renal disease on dialysis 3. Due to bladder management, bowel management, safety, skin/wound care, disease management, medication administration, pain management and patient education, does the patient require 24 hr/day rehab nursing? Yes 4. Does the patient require coordinated care of a physician, rehab nurse, PT (1-2 hrs/day, 5 days/week), OT (1-2 hrs/day, 5 days/week) and SLP (0.5-1 hrs/day, 5 days/week) to address physical and functional deficits in the context of the above medical diagnosis(es)? Yes Addressing deficits in the following areas: balance, endurance, locomotion, strength, transferring, bowel/bladder control, bathing, dressing, feeding, grooming, toileting, cognition, swallowing and psychosocial support 5. Can the patient actively participate in an intensive therapy program of at least 3 hrs of therapy per day at least 5 days per week? Yes 6. The potential for patient to make measurable gains while on inpatient rehab is good and fair 7. Anticipated functional outcomes upon discharge from inpatient rehab are supervision and min assist  with PT, supervision  and min assist with OT, supervision and min assist with SLP. 8. Estimated rehab length of stay to reach the above functional goals is: 14-20 days 9. Does the patient have adequate social supports and living environment to accommodate these discharge functional goals? Yes 10. Anticipated D/C setting: Home 11. Anticipated post D/C treatments: HH therapy 12. Overall Rehab/Functional Prognosis: good and fair  RECOMMENDATIONS: This patient's condition is appropriate for continued rehabilitative care in the following setting: CIR Patient has agreed to participate in recommended program. N/A Note that insurance prior authorization may be required for reimbursement for recommended care.  Comment:     07/22/2015

## 2015-07-22 NOTE — Progress Notes (Signed)
Patient's husband request that Depakote be given after PT sees patient.

## 2015-07-22 NOTE — Progress Notes (Signed)
RT note: Placed patient on CPAP previous setting. Patient resting comfortably.

## 2015-07-22 NOTE — Progress Notes (Addendum)
       Patient Name: Gail Chapman Date of Encounter: 07/22/2015    SUBJECTIVE: Clinically improved. Unfortunately, the husband is not in the room at the time of rounds. I have carefully reviewed the neurology and nephrology.  TELEMETRY:  Regular rhythm without obvious atrial fibrillation Filed Vitals:   07/21/15 2100 07/22/15 0057 07/22/15 0500 07/22/15 0831  BP: 140/57  148/52 153/59  Pulse: 63 66 68 60  Temp: 98.2 F (36.8 C)  97.7 F (36.5 C) 98.1 F (36.7 C)  TempSrc: Oral  Oral Oral  Resp: 16 16 16 18  Height:      Weight: 146 lb 3.2 oz (66.316 kg)     SpO2: 100% 95% 96% 98%    Intake/Output Summary (Last 24 hours) at 07/22/15 1153 Last data filed at 07/22/15 1109  Gross per 24 hour  Intake    240 ml  Output    302 ml  Net    -62 ml   LABS: Basic Metabolic Panel:  Recent Labs  07/21/15 0452 07/22/15 0620  NA 134* 138  K 4.7 4.7  CL 94* 98*  CO2 24 23  GLUCOSE 84 67  BUN 36* 48*  CREATININE 5.14* 6.52*  CALCIUM 9.1 9.1   CBC:  Recent Labs  07/21/15 0452 07/22/15 0620  WBC 7.8 9.6  HGB 10.2* 9.4*  HCT 31.7* 28.5*  MCV 88.8 86.9  PLT 177 161   Radiology/Studies:  No new data  Physical Exam: Blood pressure 153/59, pulse 60, temperature 98.1 F (36.7 C), temperature source Oral, resp. rate 18, height 5' 1.5" (1.562 m), weight 146 lb 3.2 oz (66.316 kg), SpO2 98 %. Weight change:   Wt Readings from Last 3 Encounters:  07/21/15 146 lb 3.2 oz (66.316 kg)  05/05/15 165 lb (74.844 kg)  08/28/14 169 lb (76.658 kg)   More awake and alert No respiratory distress while sitting in chair at bedside Lungs are clear No edema Conversation is improved but still with some evidence of confusion   ASSESSMENT:  1. The patient seems clinically improved with reference to alertness. 2. CVA with suspicion of embolus as a contributing factor. 3. Hypertensive heart disease 4. Acute on chronic diastolic heart failure, improved with dialysis 5. Concern for  possible atrial fibrillation. Will do TEE to rule out intracavitary thrombus or other explanation for emboli and if no obvious findings, we'll proceed to a loop recorder  Plan:   We will try to proceed with both TEE and a LOOP recorder tomorrow if possible. I will need to clear this with the patient's husband who is been somewhat reluctant for her to have sedation/agents that cause her to become somnolent.   These procedures will be performed if my colleague's who provide this service feel comfortable proceeding.  We'll come back around to discuss with husband later today.  Signed, SMITH III,HENRY W 07/22/2015, 11:53 AM 

## 2015-07-22 NOTE — Progress Notes (Signed)
[facility] Progress Note   Staphanie Harbison Korte  ZOX:[id number]  DOB: 14-Jul-1956  DOA: 07/12/2015 PCP: No PCP Per Patient  Brief narrative: Gail Chapman is a 59 y.o. female with chronic bilateral lower extremity weakness who is wheelchair bound, hypertension, hyperlipidemia, diabetes mellitus, diastolic heart failure and pulmonary hypertension, COPD on 3 L of oxygen, end-stage renal disease on hemodialysis and obstructive sleep apnea.  The patient presented to the hospital with confusion and dizziness and a witnessed seizure. Seizure episode lasted for about 2-3 minutes. She was initially brought into [facility] ER and noted to have hypertensive urgency with a blood pressure of 260/130 and a troponin of 0.46. Cardizem infusion was started in the ER. She had admitted to missing hemodialysis on that day.  Initial CT scan of the head showed age indeterminate small infarcts. An MRI revealed chronic hemorrhage in the brainstem and left temporal lobe secondary to chronic hypertension but also acute infarcts in the occipital lobe and frontal lobes bilaterally. She was transferred to [facility] and evaluated by neurology.   Subjective: Seen with husband at bedside. Appears to be much better, more awake and alert, husband was feeding her breakfast. Stating that she feels better, denies any significant symptoms. Per cardiology TEE in a.m. Discussed with the husband he probably will take her back home NOT to a SNF.  Assessment/Plan: Principal Problem:   Acute encephalopathy -Suspected to be secondary to accelerated hypertension, CVA and post ictal state -Still appears to be quite slow to respond to questions and intermittently is having difficulty with following commands. -Medications including Depakote, Vimpat and Doxil Zosyn can cause somnolence  Sepsis -Had a fever of 101.9 with heart rate of 104 yesterday consistent with sepsis. -Blood and urine obtained, urinalysis  consistent with UTI. -Started on Zosyn and daptomycin (is allergic to vancomycin). No fever or chills today. -Follow cultures closely and adjust antibiotics according to culture results.    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 --Continue full dose aspirin for now  -PT recommended skilled nursing facility, patient does not want to take her home.  Vomiting - Diabetic gastroparesis? Essentially vomited up a small amount of bile/gastric fluid as she had not eaten any breakfast - Resolved-was tolerating liquids while I was in the room - PRN antiemetics  Seizure -Continue Depakote per neurology - Vimpat added. -EEG reveals an epileptogenic potential  End-stage renal disease on hemodialysis -Dialysis Monday Wednesday Friday-nephrology following  Accelerated hypertension - long h/o uncontrolled BP -improving with adjustment of antihypertensives  -cont Cardura and carvedilol- IV Lopressor ordered to use as needed - hydralazine, labetalol and clonidine mentioned as allergies  Insulin-dependent diabetes mellitus -Continue Levemir and sliding scale insulin  Mild troponin elevation/ focal akinesis on ECHO  - see ECHO report below -Evaluated by cardiology - troponin elevated suspected to be demand ischemia - further work up for focal hypokinesis, per cardiology, to be done at a later date pending completion of neuro w/u   COPD -Chronically on 3 L of oxygen at home  Mod Pericardial effusion - chronic- no tamponade  Generalized weakness PT recommended skilled nursing facility-  Code Status:     Code Status Orders        Start  Ordered   07/12/15 2046   Full code   Continuous     07/12/15 2047     Family Communication: With husband  Disposition Plan:   skilled nursing facility - social work consulted DVT prophylaxis: Heparin  Consultants:Neurology, nephrology  Procedures: EEG 10/4 Carotid duplex 2-D echo - ejection fraction was in the range of 50% to 55%. - reversible restrictive pattern, (grade 3 diastolic dysfunction) - akinesis of the basal-midinferior myocardium - Mitral valve: Severely calcified annulus. There was moderate regurgitation. - Left atrium: The atrium was moderately dilated. - Right ventricle: The cavity size was mildly dilated. Wall thickness was normal. - Pulmonary arteries: Systolic pressure was mildly increased. PA peak pressure: 43 mm Hg (S). - Pericardium, extracardiac: A small to moderate pericardial effusion was identified posterior to the heart (1.5cm). Features were not consistent with tamponade physiology.  Antibiotics: Anti-infectives    Start     Dose/Rate Route Frequency Ordered Stop   07/21/15 2200  piperacillin-tazobactam (ZOSYN) IVPB 2.25 g     2.25 g 100 mL/hr over 30 Minutes Intravenous 3 times per day 07/21/15 1749     07/21/15 1830  DAPTOmycin (CUBICIN) 540 mg in sodium chloride 0.9 % IVPB     8 mg/kg  67.5 kg 221.6 mL/hr over 30 Minutes Intravenous  Once 07/21/15 1749 07/21/15 2316      Objective: Filed Weights   07/18/15 2118 07/19/15 0648 07/21/15 2100  Weight: 67.586 kg (149 lb) 67.5 kg (148 lb 13 oz) 66.316 kg (146 lb 3.2 oz)    Intake/Output Summary (Last 24 hours) at 07/22/15 1303 Last data filed at 07/22/15 1257  Gross per 24 hour  Intake    240 ml  Output    302 ml  Net    -62 ml     Vitals Filed Vitals:   07/21/15 2100 07/22/15 0057 07/22/15 0500 07/22/15 0831  BP: 140/57  148/52 153/59  Pulse: 63 66 68 60  Temp: 98.2 F (36.8 C)  97.7 F (36.5 C) 98.1 F (36.7 C)  TempSrc: Oral  Oral Oral  Resp: 16 16 16 18   Height:      Weight: 66.316 kg  (146 lb 3.2 oz)     SpO2: 100% 95% 96% 98%    Exam:  General:  Pt is alert, not in acute distress  HEENT: No icterus, No thrush, oral mucosa moist  Cardiovascular: regular rate and rhythm, S1/S2 No murmur  Respiratory: clear to auscultation bilaterally   Abdomen: Soft, +Bowel sounds, non tender, non distended, no guarding  MSK: No LE edema, cyanosis or clubbing  Neuro: psychomotor slowing noted; Left arm and leg 4 out of 5 in strength-right arm and leg 5 over 5-cranial nerves II through XII intact  Data Reviewed: Basic Metabolic Panel:  Recent Labs Lab 07/18/15 0133 07/19/15 0508 07/19/15 0650 07/20/15 0615 07/21/15 0452 07/22/15 0620  NA 134* 134* 133* 135 134* 138  K 4.7 5.2* 5.3* 4.2 4.7 4.7  CL 98* 96* 97* 97* 94* 98*  CO2 23 24 24 28 24 23   GLUCOSE 105* 91 95 85 84 67  BUN 35* 57* 59* 24* 36* 48*  CREATININE 5.25* 6.60* 6.50* 3.89* 5.14* 6.52*  CALCIUM 8.5* 8.4* 8.3* 8.4* 9.1 9.1  PHOS 6.5*  --  7.4*  --   --   --    Liver Function Tests:  Recent Labs Lab 07/18/15 0133 07/19/15 0650  ALBUMIN 2.7* 2.7*   No results for input(s): LIPASE, AMYLASE in the last 168  hours.  Recent Labs Lab 07/20/15 0710  AMMONIA 33   CBC:  Recent Labs Lab 07/19/15 0508 07/19/15 0650 07/20/15 0615 07/21/15 0452 07/22/15 0620  WBC 5.5 6.2 5.3 7.8 9.6  HGB 9.7* 9.3* 9.6* 10.2* 9.4*  HCT 31.5* 29.7* 31.6* 31.7* 28.5*  MCV 89.0 89.2 89.8 88.8 86.9  PLT 174 170 173 177 161   Cardiac Enzymes:  Recent Labs Lab 07/15/15 1638 07/15/15 1942  TROPONINI 0.06* 0.05*   BNP (last 3 results)  Recent Labs  07/13/15 0344  BNP >4500.0*    ProBNP (last 3 results) No results for input(s): PROBNP in the last 8760 hours.  CBG:  Recent Labs Lab 07/21/15 0800 07/21/15 1132 07/21/15 1657 07/22/15 0753 07/22/15 1147  GLUCAP 80 88 89 76 117*    Recent Results (from the past 240 hour(s))  MRSA PCR Screening     Status: None   Collection Time: 07/12/15  7:40 PM   Result Value Ref Range Status   MRSA by PCR NEGATIVE NEGATIVE Final    Comment:        The GeneXpert MRSA Assay (FDA approved for NASAL specimens only), is one component of a comprehensive MRSA colonization surveillance program. It is not intended to diagnose MRSA infection nor to guide or monitor treatment for MRSA infections.   MRSA PCR Screening     Status: None   Collection Time: 07/14/15  3:05 PM  Result Value Ref Range Status   MRSA by PCR NEGATIVE NEGATIVE Final    Comment:        The GeneXpert MRSA Assay (FDA approved for NASAL specimens only), is one component of a comprehensive MRSA colonization surveillance program. It is not intended to diagnose MRSA infection nor to guide or monitor treatment for MRSA infections.      Studies: No results found.  Scheduled Meds:  Scheduled Meds: . antiseptic oral rinse  7 mL Mouth Rinse BID  . aspirin EC  325 mg Oral Daily  . calcitRIOL  0.25 mcg Oral Q M,W,F-HD  . carvedilol  12.5 mg Oral BID WC  . darbepoetin (ARANESP) injection - DIALYSIS  60 mcg Intravenous Q Wed-HD  . divalproex  750 mg Oral Q12H  . doxazosin  2 mg Oral Daily  . heparin  5,000 Units Subcutaneous 3 times per day  . insulin aspart  0-9 Units Subcutaneous TID WC  . insulin glargine  3 Units Subcutaneous Daily  . lacosamide (VIMPAT) IV  50 mg Intravenous Q12H  . pantoprazole (PROTONIX) IV  40 mg Intravenous Q24H  . piperacillin-tazobactam (ZOSYN)  IV  2.25 g Intravenous 3 times per day  . regadenoson  0.4 mg Intravenous Once  . sevelamer carbonate  800 mg Oral TID WC  . sodium chloride  3 mL Intravenous Q12H   Continuous Infusions: . sodium chloride 20 mL/hr at 07/20/15 0600    Time spent on care of this patient: 35 min   Reuel Lamadrid A, MD 07/22/2015, 1:03 PM  LOS: 10 days   [facility] Office  [phone] Pager - Text Page per [email or web address] If 7PM-7AM, please contact night-coverage [email or web address]

## 2015-07-22 NOTE — Progress Notes (Signed)
Speech Language Pathology Treatment: Cognitive-Linquistic  Patient Details Name: Gail Chapman MRN: [id number] DOB: [birth year] Today's Date: 07/22/2015 Time: 9735-3299 SLP Time Calculation (min) (ACUTE ONLY): 23 min  Assessment / Plan / Recommendation Clinical Impression  Husband states pt is beginning to exhibit extended periods of alertness compared to past several days (due to med that is now stopped). Baseline pt experienced visual disturbance due to MS and husband write checks and reads mail. Pt oriented to situation today and close approximation for day of week. Moderate intermittent verbal stimulation for arousal/sustained attention. Followed written instructions (with SLP reading) with moderate verbal/visual and tactile cues using tactile scanning/edging technique to see left side of paper. Husband reports pt cognitively intact at baseline. Continue ST.    HPI Other Pertinent Information: 59 y.o. female with PMH of MS, chronic bilateral leg weakness (using wheelchair), hypertension, hyperlipidemia, diabetes mellitus, diastolic congestive heart failure, pulmonary hypertension, OSA on CPAP, COPD on 3 L oxygen at home, and end-stage renal disease-HD (MWF), who presents with altered mental status, dizziness, seizure.MRI on 10/6 revealed acute infarcts in occipital love and frontal lobes bilaterally as well as chronic hemorrhage in brainstem and L temporal lobe due to chronic HTN.    Pertinent Vitals Pain Assessment: No/denies pain  SLP Plan  Continue with current plan of care    Recommendations                Oral Care Recommendations: Oral care BID Follow up Recommendations: 24 hour supervision/assistance Plan: Continue with current plan of care    GO     Royce Macadamia 07/22/2015, 10:39 AM  Breck Coons Lonell Face.Ed [employer] [phone]

## 2015-07-22 NOTE — Progress Notes (Signed)
RT note: Placed patient on CPAP previous setting. Resting comfortably. Vital signs stable.

## 2015-07-22 NOTE — Progress Notes (Signed)
Inpatient Rehabilitation  We have received a prescreen request for possible IP Rehab.  I note that an IP Rehab consult has been ordered.  We will await results of consult and follow up accordingly.  Please call if questions.  [name] PT Inpatient Rehab Admissions Coordinator Cell [phone] Office [phone]

## 2015-07-22 NOTE — Progress Notes (Signed)
Physical Therapy Treatment Patient Details Name: Gail Chapman MRN: [id number] DOB: Apr 03, 1956 Today's Date: 07/22/2015    History of Present Illness Pt is a 59 y/o female with a PMH of DM, uncontrolled HTN, CHF, COPD, ESRD (MWF), who initially presented 10/3 with confusion and probable seizure. On 10/5 after HD she had episode of AMS with acute HTN with SBP 200. MRI on 10/6 revealed acute infarcts in occipital love and frontal lobes bilaterally as well as chronic hemorrhage in brainstem and L temporal lobe due to chronic HTN.     PT Comments    Pt more alert today, although with slow processing.  Patient would benefit from post acute rehab.  If pt is not deemed a candidate for CIR and family refuses SNF, then will need HHPT, w/c, and possibly lift equipment depending on pt's level at time of d/c.  Follow Up Recommendations  CIR;Supervision/Assistance - 24 hour     Equipment Recommendations  Wheelchair (measurements PT);Wheelchair cushion (measurements PT)    Recommendations for Other Services       Precautions / Restrictions Precautions Precautions: Fall Restrictions Weight Bearing Restrictions: No    Mobility  Bed Mobility Overal bed mobility: Needs Assistance Bed Mobility: Supine to Sit     Supine to sit: Mod assist;+2 for physical assistance     General bed mobility comments: Use of bed pad to A to get hips fully turned.  Slow processing.  Transfers Overall transfer level: Needs assistance Equipment used: 2 person hand held assist Transfers: Stand Pivot Transfers Sit to Stand: Max assist;+2 physical assistance Stand pivot transfers: Max assist;+2 physical assistance       General transfer comment: Initially pt keeping legs flexed upon standing, but with cueing did stand up erect.  Difficulty taking steps.  Ambulation/Gait                 Stairs            Wheelchair Mobility    Modified Rankin (Stroke Patients Only) Modified Rankin (Stroke  Patients Only) Pre-Morbid Rankin Score: Slight disability Modified Rankin: Severe disability     Balance Overall balance assessment: Needs assistance Sitting-balance support: Feet supported Sitting balance-Leahy Scale: Poor Sitting balance - Comments: Pt able to sit briefly wihtout A, but then leans R.     Standing balance-Leahy Scale: Zero Standing balance comment: Stood with MAX of 2                    Cognition Arousal/Alertness: Awake/alert Behavior During Therapy: Flat affect Overall Cognitive Status: Impaired/Different from baseline Area of Impairment: Following commands;Safety/judgement;Awareness;Problem solving Orientation Level: Disoriented to;Time Current Attention Level: Sustained Memory: Decreased short-term memory Following Commands: Follows one step commands with increased time;Follows one step commands inconsistently Safety/Judgement: Decreased awareness of deficits   Problem Solving: Slow processing;Decreased initiation;Requires verbal cues;Requires tactile cues General Comments: Pt more alert today, but decreased speed with questions.    Exercises General Exercises - Lower Extremity Ankle Circles/Pumps: Both;10 reps    General Comments General comments (skin integrity, edema, etc.): Pt verbalized understanding that she is too weak to attempt to get up.      Pertinent Vitals/Pain Pain Assessment: No/denies pain    Home Living                      Prior Function            PT Goals (current goals can now be found in the care plan section) Progress  towards PT goals: Progressing toward goals    Frequency  Min 3X/week    PT Plan Current plan remains appropriate    Co-evaluation             End of Session Equipment Utilized During Treatment: Gait belt Activity Tolerance: Patient limited by fatigue Patient left: in chair;with call bell/phone within reach;with chair alarm set     Time: 1059-1116 PT Time Calculation (min)  (ACUTE ONLY): 17 min  Charges:  $Therapeutic Activity: 8-22 mins                    G Codes:      Gail Chapman 07/22/2015, 1:23 PM

## 2015-07-23 ENCOUNTER — Inpatient Hospital Stay (HOSPITAL_COMMUNITY): Payer: Medicare Other

## 2015-07-23 ENCOUNTER — Encounter (HOSPITAL_COMMUNITY)
Admission: AD | Disposition: A | Discharge status: Rehabilitation Facility | Payer: Self-pay | Source: Other Acute Inpatient Hospital | Attending: Internal Medicine

## 2015-07-23 ENCOUNTER — Encounter (HOSPITAL_COMMUNITY): Payer: Self-pay

## 2015-07-23 DIAGNOSIS — I10 Essential (primary) hypertension: Secondary | ICD-10-CM

## 2015-07-23 DIAGNOSIS — I639 Cerebral infarction, unspecified: Secondary | ICD-10-CM

## 2015-07-23 HISTORY — PX: TEE WITHOUT CARDIOVERSION: SHX5443

## 2015-07-23 LAB — RENAL FUNCTION PANEL
ANION GAP: 15 (ref 5–15)
Albumin: 2.5 g/dL — ABNORMAL LOW (ref 3.5–5.0)
Albumin: 2.8 g/dL — ABNORMAL LOW (ref 3.5–5.0)
Anion gap: 11 (ref 5–15)
BUN: 56 mg/dL — AB (ref 6–20)
BUN: 12 mg/dL (ref 6–20)
CHLORIDE: 95 mmol/L — AB (ref 101–111)
CHLORIDE: 96 mmol/L — AB (ref 101–111)
CO2: 23 mmol/L (ref 22–32)
CO2: 28 mmol/L (ref 22–32)
CREATININE: 2.98 mg/dL — AB (ref 0.44–1.00)
Calcium: 8.1 mg/dL — ABNORMAL LOW (ref 8.9–10.3)
Calcium: 8.7 mg/dL — ABNORMAL LOW (ref 8.9–10.3)
Creatinine, Ser: 7.77 mg/dL — ABNORMAL HIGH (ref 0.44–1.00)
GFR, EST AFRICAN AMERICAN: 6 mL/min — AB (ref >60)
GFR, EST AFRICAN AMERICAN: 19 mL/min — AB (ref >60)
GFR, EST NON AFRICAN AMERICAN: 5 mL/min — AB (ref >60)
GFR, EST NON AFRICAN AMERICAN: 16 mL/min — AB (ref >60)
Glucose, Bld: 83 mg/dL (ref 65–99)
Glucose, Bld: 105 mg/dL — ABNORMAL HIGH (ref 65–99)
PHOSPHORUS: 2.6 mg/dL (ref 2.5–4.6)
POTASSIUM: 4.6 mmol/L (ref 3.5–5.1)
Phosphorus: 6.3 mg/dL — ABNORMAL HIGH (ref 2.5–4.6)
Potassium: 3.3 mmol/L — ABNORMAL LOW (ref 3.5–5.1)
Sodium: 133 mmol/L — ABNORMAL LOW (ref 135–145)
Sodium: 135 mmol/L (ref 135–145)

## 2015-07-23 LAB — CBC
HCT: 32.7 % — ABNORMAL LOW (ref 36.0–46.0)
Hemoglobin: 10.1 g/dL — ABNORMAL LOW (ref 12.0–15.0)
MCH: 27.5 pg (ref 26.0–34.0)
MCHC: 30.9 g/dL (ref 30.0–36.0)
MCV: 89.1 fL (ref 78.0–100.0)
PLATELETS: 182 10*3/uL (ref 150–400)
RBC: 3.67 MIL/uL — AB (ref 3.87–5.11)
RDW: 15.6 % — ABNORMAL HIGH (ref 11.5–15.5)
WBC: 8.6 10*3/uL (ref 4.0–10.5)

## 2015-07-23 LAB — CBC WITH DIFFERENTIAL/PLATELET
BASOS PCT: 0 %
Basophils Absolute: 0 10*3/uL (ref 0.0–0.1)
EOS PCT: 2 %
Eosinophils Absolute: 0.2 10*3/uL (ref 0.0–0.7)
HEMATOCRIT: 28.1 % — AB (ref 36.0–46.0)
Hemoglobin: 8.8 g/dL — ABNORMAL LOW (ref 12.0–15.0)
Lymphocytes Relative: 12 %
Lymphs Abs: 1.1 10*3/uL (ref 0.7–4.0)
MCH: 27.8 pg (ref 26.0–34.0)
MCHC: 31.3 g/dL (ref 30.0–36.0)
MCV: 88.9 fL (ref 78.0–100.0)
MONO ABS: 0.8 10*3/uL (ref 0.1–1.0)
Monocytes Relative: 8 %
NEUTROS PCT: 78 %
Neutro Abs: 7.4 10*3/uL (ref 1.7–7.7)
Platelets: 178 10*3/uL (ref 150–400)
RBC: 3.16 MIL/uL — ABNORMAL LOW (ref 3.87–5.11)
RDW: 15.8 % — AB (ref 11.5–15.5)
WBC: 9.5 10*3/uL (ref 4.0–10.5)

## 2015-07-23 LAB — VALPROIC ACID LEVEL: Valproic Acid Lvl: 79 ug/mL (ref 50.0–100.0)

## 2015-07-23 LAB — GLUCOSE, CAPILLARY
GLUCOSE-CAPILLARY: 75 mg/dL (ref 65–99)
GLUCOSE-CAPILLARY: 119 mg/dL — AB (ref 65–99)
GLUCOSE-CAPILLARY: 123 mg/dL — AB (ref 65–99)

## 2015-07-23 SURGERY — ECHOCARDIOGRAM, TRANSESOPHAGEAL
Anesthesia: Moderate Sedation

## 2015-07-23 MED ORDER — LIDOCAINE VISCOUS 2 % MT SOLN
OROMUCOSAL | Status: AC
  Filled 2015-07-23: qty 15

## 2015-07-23 MED ORDER — CALCITRIOL 0.25 MCG PO CAPS
ORAL_CAPSULE | ORAL | Status: AC
  Administered 2015-07-23: 0.25 ug via ORAL
  Filled 2015-07-23: qty 1

## 2015-07-23 MED ORDER — MIDAZOLAM HCL 10 MG/2ML IJ SOLN
INTRAMUSCULAR | Status: DC | PRN
  Administered 2015-07-23: 2 mg via INTRAVENOUS

## 2015-07-23 MED ORDER — LIDOCAINE HCL (PF) 1 % IJ SOLN: 5.0000 mL | INTRAMUSCULAR | Status: DC | PRN

## 2015-07-23 MED ORDER — DIPHENHYDRAMINE HCL 50 MG/ML IJ SOLN
INTRAMUSCULAR | Status: AC
  Filled 2015-07-23: qty 1

## 2015-07-23 MED ORDER — LIDOCAINE-PRILOCAINE 2.5-2.5 % EX CREA
1.0000 "application " | TOPICAL_CREAM | CUTANEOUS | Status: DC | PRN
  Filled 2015-07-23: qty 5

## 2015-07-23 MED ORDER — FENTANYL CITRATE (PF) 100 MCG/2ML IJ SOLN
INTRAMUSCULAR | Status: DC | PRN
  Administered 2015-07-23: 25 ug via INTRAVENOUS

## 2015-07-23 MED ORDER — SODIUM CHLORIDE 0.9 % IV SOLN: INTRAVENOUS | Status: DC

## 2015-07-23 MED ORDER — MIDAZOLAM HCL 5 MG/ML IJ SOLN
INTRAMUSCULAR | Status: AC
  Filled 2015-07-23: qty 2

## 2015-07-23 MED ORDER — LIDOCAINE VISCOUS 2 % MT SOLN
OROMUCOSAL | Status: DC | PRN
  Administered 2015-07-23: 1 via OROMUCOSAL

## 2015-07-23 MED ORDER — PRAVASTATIN SODIUM 40 MG PO TABS
40.0000 mg | ORAL_TABLET | Freq: Every day | ORAL | Status: DC
  Administered 2015-07-24 – 2015-07-26 (×2): 40 mg via ORAL
  Filled 2015-07-23 (×2): qty 1

## 2015-07-23 MED ORDER — PANTOPRAZOLE SODIUM 40 MG PO TBEC
40.0000 mg | DELAYED_RELEASE_TABLET | Freq: Every day | ORAL | Status: DC
  Administered 2015-07-24 – 2015-07-25 (×2): 40 mg via ORAL
  Filled 2015-07-23 (×2): qty 1

## 2015-07-23 MED ORDER — DEXTROSE 5 % IV SOLN
1.0000 g | INTRAVENOUS | Status: DC
  Administered 2015-07-23 – 2015-07-26 (×4): 1 g via INTRAVENOUS
  Filled 2015-07-23 (×4): qty 10

## 2015-07-23 MED ORDER — FENTANYL CITRATE (PF) 100 MCG/2ML IJ SOLN
INTRAMUSCULAR | Status: AC
  Filled 2015-07-23: qty 2

## 2015-07-23 MED ORDER — ALTEPLASE 2 MG IJ SOLR
2.0000 mg | Freq: Once | INTRAMUSCULAR | Status: DC | PRN
  Filled 2015-07-23: qty 2

## 2015-07-23 MED ORDER — SODIUM CHLORIDE 0.9 % IV SOLN: 100.0000 mL | INTRAVENOUS | Status: DC | PRN

## 2015-07-23 MED ORDER — HEPARIN SODIUM (PORCINE) 1000 UNIT/ML DIALYSIS: 1000.0000 [IU] | INTRAMUSCULAR | Status: DC | PRN

## 2015-07-23 MED ORDER — PENTAFLUOROPROP-TETRAFLUOROETH EX AERO: 1.0000 "application " | INHALATION_SPRAY | CUTANEOUS | Status: DC | PRN

## 2015-07-23 NOTE — Progress Notes (Signed)
  Echocardiogram Echocardiogram Transesophageal has been performed.  Leta Jungling M 07/23/2015, 2:44 PM

## 2015-07-23 NOTE — Progress Notes (Addendum)
Probable transfer to CIR (per [name], CIR) on Monday, 07/26/2015.  CM will continue to follow for final disposition and discharge needs as they arise. [employer], CM had been working with spouse who had expressed choices for [facility] if unable to go to CIR with HHRN, HHPT, and DME through [facility].Marland KitchenSister assists in the home as she lives with them. CM will continue to follow for final disposition.  Pt has had extremely elevated SBP requiring medication within past 24H. Will continue to follow

## 2015-07-23 NOTE — Progress Notes (Signed)
[facility] Progress Note   Gail Chapman  VHQ:[id number]  DOB: [birth year]  DOA: 07/12/2015 PCP: No PCP Per Patient  Brief narrative: Gail Chapman is a 59 y.o. female with chronic bilateral lower extremity weakness who is wheelchair bound, hypertension, hyperlipidemia, diabetes mellitus, diastolic heart failure and pulmonary hypertension, COPD on 3 L of oxygen, end-stage renal disease on hemodialysis and obstructive sleep apnea.  The patient presented to the hospital with confusion and dizziness and a witnessed seizure. Seizure episode lasted for about 2-3 minutes. She was initially brought into [facility] ER and noted to have hypertensive urgency with a blood pressure of 260/130 and a troponin of 0.46. Cardizem infusion was started in the ER. She had admitted to missing hemodialysis on that day.  Initial CT scan of the head showed age indeterminate small infarcts. An MRI revealed chronic hemorrhage in the brainstem and left temporal lobe secondary to chronic hypertension but also acute infarcts in the occipital lobe and frontal lobes bilaterally. She was transferred to [facility] and evaluated by neurology.   Subjective: Seen with husband at bedside, had her TEE earlier today, probably showing very small PFO or intra-pulmonary shunt. Urinalysis showed Escherichia coli, antibiotics de-escalated. Overall doing much better, more awake and alert and engaging in conversation today. Likely she will go to CIR on Monday.  Assessment/Plan: Principal Problem:   Acute encephalopathy -Suspected to be secondary to accelerated hypertension, CVA and post ictal state -Still appears to be quite slow to respond to questions and intermittently is having difficulty with following commands. -Medications including Depakote, Vimpat and Doxil Zosyn can cause somnolence  Sepsis, secondary to UTI -Had a fever of 101.9 with heart rate of 104 yesterday consistent with sepsis. -Blood  and urine obtained, urinalysis consistent with UTI. -Started on Zosyn and daptomycin (is allergic to vancomycin). No fever or chills today. -Urine culture showed Escherichia coli, daptomycin/Zosyn discontinued, Rocephin started.    CVA - b/l frontal and occipital infarcts -Neurology suspects a cardioembolic source vs due to hypotension -  lower extremity duplex negative for DVT -Carotid duplex does not reveal any significant stenosis -Transcranial Doppler consistent with diffuse intracranial atherosclerosis  -Transthoracic echo did not reveal a thrombus, TEE done on 07/23/15 showed no evidence of intracardiac thrombus. -EP cardiology consulted, recommended to defer doing the loop recorder. -The justification for that even if the patient has atrial fibrillation, patient has history of intracranial hemorrhage will contraindicate anticoagulation.  Vomiting - Diabetic gastroparesis? Essentially vomited up a small amount of bile/gastric fluid as she had not eaten any breakfast - Resolved-was tolerating liquids while I was in the room - PRN antiemetics  Seizure -Continue Depakote per neurology - Vimpat added. -EEG reveals an epileptogenic potential  End-stage renal disease on hemodialysis -Dialysis Monday Wednesday Friday-nephrology following  Accelerated hypertension - long h/o uncontrolled BP, -improving with adjustment of antihypertensives  -cont Cardura and carvedilol- IV Lopressor ordered to use as needed - hydralazine, labetalol and clonidine mentioned as allergies  Insulin-dependent diabetes mellitus -Continue Levemir and sliding scale insulin  Mild troponin elevation/ focal akinesis on ECHO  - see ECHO report below -Evaluated by cardiology - troponin elevated suspected to be demand ischemia - further work up for focal hypokinesis, per cardiology, to be done at a later date pending completion of neuro w/u   COPD -Chronically on 3 L of oxygen at home  Mod Pericardial  effusion - chronic- no tamponade  Generalized weakness PT recommended skilled nursing facility-  Code Status:  Code Status Orders        Start     Ordered   07/12/15 2046  Full code   Continuous     07/12/15 2047     Family Communication: With husband  Disposition Plan:   skilled nursing facility - social work consulted DVT prophylaxis: Heparin  Consultants:Neurology, nephrology  Procedures: EEG 10/4 Carotid duplex 2-D echo - ejection fraction was in the range of 50% to 55%. - reversible restrictive pattern, (grade 3 diastolic dysfunction) - akinesis of the basal-midinferior myocardium - Mitral valve: Severely calcified annulus. There was moderate regurgitation. - Left atrium: The atrium was moderately dilated. - Right ventricle: The cavity size was mildly dilated. Wall thickness was normal. - Pulmonary arteries: Systolic pressure was mildly increased. PA peak pressure: 43 mm Hg (S). - Pericardium, extracardiac: A small to moderate pericardial effusion was identified posterior to the heart (1.5cm). Features were not consistent with tamponade physiology.  Antibiotics: Anti-infectives    Start     Dose/Rate Route Frequency Ordered Stop   07/23/15 1600  cefTRIAXone (ROCEPHIN) 1 g in dextrose 5 % 50 mL IVPB     1 g 100 mL/hr over 30 Minutes Intravenous Every 24 hours 07/23/15 1314     07/21/15 2200  piperacillin-tazobactam (ZOSYN) IVPB 2.25 g  Status:  Discontinued     2.25 g 100 mL/hr over 30 Minutes Intravenous 3 times per day 07/21/15 1749 07/23/15 1313   07/21/15 1830  DAPTOmycin (CUBICIN) 540 mg in sodium chloride 0.9 % IVPB     8 mg/kg  67.5 kg 221.6 mL/hr over 30 Minutes Intravenous  Once 07/21/15 1749 07/21/15 2316      Objective: Filed Weights   07/21/15 2100 07/23/15 0712 07/23/15 1141  Weight: 66.316 kg (146 lb 3.2 oz) 66.3 kg (146 lb 2.6 oz) 64.9 kg (143 lb 1.3 oz)    Intake/Output Summary (Last 24 hours) at 07/23/15 1531 Last  data filed at 07/23/15 1325  Gross per 24 hour  Intake    170 ml  Output   1004 ml  Net   -834 ml     Vitals Filed Vitals:   07/23/15 1405 07/23/15 1415 07/23/15 1425 07/23/15 1500  BP: 242/83 245/78 254/103 199/89  Pulse: 77 75 75 77  Temp:  97.6 F (36.4 C)  97.5 F (36.4 C)  TempSrc:  Oral  Oral  Resp: 27 24 27 20   Height:      Weight:      SpO2: 96% 98% 98% 99%    Exam:  General:  Pt is alert, not in acute distress  HEENT: No icterus, No thrush, oral mucosa moist  Cardiovascular: regular rate and rhythm, S1/S2 No murmur  Respiratory: clear to auscultation bilaterally   Abdomen: Soft, +Bowel sounds, non tender, non distended, no guarding  MSK: No LE edema, cyanosis or clubbing  Neuro: psychomotor slowing noted; Left arm and leg 4 out of 5 in strength-right arm and leg 5 over 5-cranial nerves II through XII intact  Data Reviewed: Basic Metabolic Panel:  Recent Labs Lab 07/18/15 0133  07/19/15 0650 07/20/15 0615 07/21/15 0452 07/22/15 0620 07/23/15 0800  NA 134*  < > 133* 135 134* 138 133*  K 4.7  < > 5.3* 4.2 4.7 4.7 4.6  CL 98*  < > 97* 97* 94* 98* 95*  CO2 23  < > 24 28 24 23 23   GLUCOSE 105*  < > 95 85 84 67 83  BUN 35*  < > 59* 24*  36* 48* 56*  CREATININE 5.25*  < > 6.50* 3.89* 5.14* 6.52* 7.77*  CALCIUM 8.5*  < > 8.3* 8.4* 9.1 9.1 8.1*  PHOS 6.5*  --  7.4*  --   --   --  6.3*  < > = values in this interval not displayed. Liver Function Tests:  Recent Labs Lab 07/18/15 0133 07/19/15 0650 07/23/15 0800  ALBUMIN 2.7* 2.7* 2.5*   No results for input(s): LIPASE, AMYLASE in the last 168 hours.  Recent Labs Lab 07/20/15 0710  AMMONIA 33   CBC:  Recent Labs Lab 07/19/15 0650 07/20/15 0615 07/21/15 0452 07/22/15 0620 07/23/15 0759  WBC 6.2 5.3 7.8 9.6 9.5  NEUTROABS  --   --   --   --  7.4  HGB 9.3* 9.6* 10.2* 9.4* 8.8*  HCT 29.7* 31.6* 31.7* 28.5* 28.1*  MCV 89.2 89.8 88.8 86.9 88.9  PLT 170 173 177 161 178   Cardiac  Enzymes: No results for input(s): CKTOTAL, CKMB, CKMBINDEX, TROPONINI in the last 168 hours. BNP (last 3 results)  Recent Labs  07/13/15 0344  BNP >4500.0*    ProBNP (last 3 results) No results for input(s): PROBNP in the last 8760 hours.  CBG:  Recent Labs Lab 07/22/15 0753 07/22/15 1147 07/22/15 1723 07/22/15 2123 07/23/15 1457  GLUCAP 76 117* 94 103* 75    Recent Results (from the past 240 hour(s))  MRSA PCR Screening     Status: None   Collection Time: 07/14/15  3:05 PM  Result Value Ref Range Status   MRSA by PCR NEGATIVE NEGATIVE Final    Comment:        The GeneXpert MRSA Assay (FDA approved for NASAL specimens only), is one component of a comprehensive MRSA colonization surveillance program. It is not intended to diagnose MRSA infection nor to guide or monitor treatment for MRSA infections.   Culture, blood (routine x 2)     Status: None (Preliminary result)   Collection Time: 07/21/15  6:35 PM  Result Value Ref Range Status   Specimen Description BLOOD RIGHT ANTECUBITAL  Final   Special Requests BOTTLES DRAWN AEROBIC AND ANAEROBIC R 3CC B 5CC  Final   Culture NO GROWTH 2 DAYS  Final   Report Status PENDING  Incomplete  Culture, blood (routine x 2)     Status: None (Preliminary result)   Collection Time: 07/21/15  6:40 PM  Result Value Ref Range Status   Specimen Description BLOOD RIGHT HAND  Final   Special Requests IN PEDIATRIC BOTTLE 2CC  Final   Culture NO GROWTH 2 DAYS  Final   Report Status PENDING  Incomplete  Urine culture     Status: None (Preliminary result)   Collection Time: 07/21/15 11:52 PM  Result Value Ref Range Status   Specimen Description URINE, CATHETERIZED  Final   Special Requests NONE  Final   Culture >=100,000 COLONIES/mL ESCHERICHIA COLI  Final   Report Status PENDING  Incomplete     Studies: No results found.  Scheduled Meds:  Scheduled Meds: . antiseptic oral rinse  7 mL Mouth Rinse BID  . aspirin EC  325 mg  Oral Daily  . calcitRIOL  0.25 mcg Oral Q M,W,F-HD  . carvedilol  12.5 mg Oral BID WC  . cefTRIAXone (ROCEPHIN)  IV  1 g Intravenous Q24H  . darbepoetin (ARANESP) injection - DIALYSIS  60 mcg Intravenous Q Wed-HD  . divalproex  750 mg Oral Q12H  . doxazosin  2 mg Oral Daily  .  heparin  5,000 Units Subcutaneous 3 times per day  . insulin aspart  0-9 Units Subcutaneous TID WC  . insulin glargine  3 Units Subcutaneous Daily  . lacosamide  50 mg Oral BID  . pantoprazole  40 mg Oral Daily  . pravastatin  40 mg Oral q1800  . regadenoson  0.4 mg Intravenous Once  . sevelamer carbonate  800 mg Oral TID WC  . sodium chloride  3 mL Intravenous Q12H   Continuous Infusions:    Time spent on care of this patient: 35 min   Megen Madewell A, MD 07/23/2015, 3:31 PM  LOS: 11 days   [facility] Office  [phone] Pager - Text Page per [email or web address] If 7PM-7AM, please contact night-coverage [email or web address]

## 2015-07-23 NOTE — Procedures (Signed)
Pt placed on CPAP prior to RT's arrival.  RT checked setting and added sterile water to chamber.  Pt is sleeping comfortably.

## 2015-07-23 NOTE — Interval H&P Note (Signed)
History and Physical Interval Note:  07/23/2015 12:08 PM  Gail Chapman  has presented today for surgery, with the diagnosis of STROKE  The various methods of treatment have been discussed with the patient and family. After consideration of risks, benefits and other options for treatment, the patient has consented to  Procedure(s) with comments: TRANSESOPHAGEAL ECHOCARDIOGRAM (TEE) (N/A) - PT NEED A LOOP as a surgical intervention .  The patient's history has been reviewed, patient examined, no change in status, stable for surgery.  I have reviewed the patient's chart and labs.  Questions were answered to the patient's satisfaction.     Dietrich Pates

## 2015-07-23 NOTE — Progress Notes (Signed)
Inpatient Rehabilitation  I met with pt. at the bedside in HD this am to discuss post acute rehab needs.  I then phoned her husband and I await a return call to further discuss plans with him for possible IP rehab admission.  Please call if questions.    [place] Admissions Coordinator Cell [phone] Office [phone]

## 2015-07-23 NOTE — H&P (View-Only) (Signed)
       Patient Name: Gail Chapman Date of Encounter: 07/22/2015    SUBJECTIVE: Clinically improved. Unfortunately, the husband is not in the room at the time of rounds. I have carefully reviewed the neurology and nephrology.  TELEMETRY:  Regular rhythm without obvious atrial fibrillation Filed Vitals:   07/21/15 2100 07/22/15 0057 07/22/15 0500 07/22/15 0831  BP: 140/57  148/52 153/59  Pulse: 63 66 68 60  Temp: 98.2 F (36.8 C)  97.7 F (36.5 C) 98.1 F (36.7 C)  TempSrc: Oral  Oral Oral  Resp: 16 16 16 18   Height:      Weight: 146 lb 3.2 oz (66.316 kg)     SpO2: 100% 95% 96% 98%    Intake/Output Summary (Last 24 hours) at 07/22/15 1153 Last data filed at 07/22/15 1109  Gross per 24 hour  Intake    240 ml  Output    302 ml  Net    -62 ml   LABS: Basic Metabolic Panel:  Recent Labs  43/32/95 0452 07/22/15 0620  NA 134* 138  K 4.7 4.7  CL 94* 98*  CO2 24 23  GLUCOSE 84 67  BUN 36* 48*  CREATININE 5.14* 6.52*  CALCIUM 9.1 9.1   CBC:  Recent Labs  07/21/15 0452 07/22/15 0620  WBC 7.8 9.6  HGB 10.2* 9.4*  HCT 31.7* 28.5*  MCV 88.8 86.9  PLT 177 161   Radiology/Studies:  No new data  Physical Exam: Blood pressure 153/59, pulse 60, temperature 98.1 F (36.7 C), temperature source Oral, resp. rate 18, height 5' 1.5" (1.562 m), weight 146 lb 3.2 oz (66.316 kg), SpO2 98 %. Weight change:   Wt Readings from Last 3 Encounters:  07/21/15 146 lb 3.2 oz (66.316 kg)  05/05/15 165 lb (74.844 kg)  08/28/14 169 lb (76.658 kg)   More awake and alert No respiratory distress while sitting in chair at bedside Lungs are clear No edema [name] is improved but still with some evidence of confusion   ASSESSMENT:  1. The patient seems clinically improved with reference to alertness. 2. CVA with suspicion of embolus as a contributing factor. 3. Hypertensive heart disease 4. Acute on chronic diastolic heart failure, improved with dialysis 5. Concern for  possible atrial fibrillation. Will do TEE to rule out intracavitary thrombus or other explanation for emboli and if no obvious findings, we'll proceed to a loop recorder  Plan:   We will try to proceed with both TEE and a LOOP recorder tomorrow if possible. I will need to clear this with the patient's husband who is been somewhat reluctant for her to have sedation/agents that cause her to become somnolent.   These procedures will be performed if my colleague's who provide this service feel comfortable proceeding.  We'll come back around to discuss with husband later today.  Selinda Eon 07/22/2015, 11:53 AM

## 2015-07-23 NOTE — Progress Notes (Signed)
Inpatient Rehabilitation  I met with the patient's husband in pt's room. Pt currently down for TEE.  He desires that I pursue insurance authorization for potential rehab admission.  I have initiated the process and will update the team when I receive a decision from [employer].  Please call if questions.   Susan Blankenship PT Inpatient Rehab Admissions Coordinator Cell [phone] Office [phone]    

## 2015-07-23 NOTE — Progress Notes (Signed)
STROKE TEAM PROGRESS NOTE   SUBJECTIVE (INTERVAL HISTORY) Husband at bedside. Pt had TEE done and EEG done today. No acute issue overnight.    OBJECTIVE Temp:  [97.5 F (36.4 C)-98.5 F (36.9 C)] 97.5 F (36.4 C) (10/14 1500) Pulse Rate:  [63-92] 77 (10/14 1500) Cardiac Rhythm:  [-] Normal sinus rhythm (10/14 1141) Resp:  [16-29] 20 (10/14 1500) BP: (138-254)/(52-103) 199/89 mmHg (10/14 1500) SpO2:  [93 %-100 %] 99 % (10/14 1500) Weight:  [143 lb 1.3 oz (64.9 kg)-146 lb 2.6 oz (66.3 kg)] 143 lb 1.3 oz (64.9 kg) (10/14 1141)  CBC:   Recent Labs Lab 07/23/15 0759 07/23/15 1600  WBC 9.5 8.6  NEUTROABS 7.4  --   HGB 8.8* 10.1*  HCT 28.1* 32.7*  MCV 88.9 89.1  PLT 178 182    Basic Metabolic Panel:   Recent Labs Lab 07/23/15 0800 07/23/15 1600  NA 133* 135  K 4.6 3.3*  CL 95* 96*  CO2 23 28  GLUCOSE 83 105*  BUN 56* 12  CREATININE 7.77* 2.98*  CALCIUM 8.1* 8.7*  PHOS 6.3* 2.6    IMAGING  Ct Head Wo Contrast 07/14/2015    Atrophy with extensive periventricular small vessel disease. Age uncertain small infarcts at several sites as described. No hemorrhage or mass effect. Mild ethmoid sinus disease bilaterally.     Mr Brain Wo Contrast 07/15/2015    Atrophy and moderate to advanced chronic ischemia. Chronic hemorrhage in the brainstem and left temporal lobe due to chronic hypertension  Acute infarcts in the occipital lobe and frontal lobes bilaterally.     2D Echocardiogram   - Left ventricle: The cavity size was normal. There was severeconcentric hypertrophy. Systolic function was normal. Theestimated ejection fraction was in the range of 50% to 55%. Thereis akinesis of the basal-midinferior myocardium. Dopplerparameters are consistent with a reversible restrictive pattern,indicative of decreased left ventricular diastolic complianceand/or increased left atrial pressure (grade 3 diastolicdysfunction). - Mitral valve: Severely calcified annulus. There was  moderateregurgitation. - Left atrium: The atrium was moderately dilated. - Right ventricle: The cavity size was mildly dilated. Wallthickness was normal. - Pulmonary arteries: Systolic pressure was mildly increased. PApeak pressure: 43 mm Hg (S). - Pericardium, extracardiac: A small to moderate pericardialeffusion was identified posterior to the heart (1.5cm). Featureswere not consistent with tamponade physiology.  EEG 07/13/15 This is an abnormal EEG secondary to focal sharp wave activity in the frontal region on the left with phase reversal at F7. This finding is suggestive of a focal disturbance with epileptogenic potential.   EEG 07/14/15 This is an abnormal EEG secondary to focal sharp wave activity in the frontal regions bilaterally (left more than right) with phase reversal at [facility] most predominantly. This finding is suggestive of a focal disturbance with epileptogenic potential.  EEG 07/19/15 - 1) bilateral independent parietooccipital sharp waves. 2) Generalized irregular delta activity occurring in bursts. Clinical Interpretation: This EEG is consistent with bilateral areas of potential epileptogenicity in the parietooccipital regions in the setting of a generalized non-specific cerebral dysfunction(encephalopathy). In comparison to the previous study, there has been improvement in the frontotemporal sharp waves, but persistence of the parieto-occipital sharps.  EEG 07/23/15 - Abnormal EEG due to the presence of generalized irregular slowing indicating a moderate to severe cerebral disturbance (encephalopathy). No epileptiform activity noted.   Carotid US - Bilateral: 1-39% ICA stenosis. Vertebral artery flow is antegrade.  LE venous doppler - no DVT  TCD Low normal mean flow velocities in majority of identified vessels  of anterior and posterior circulation due to suboptimal windows and suspect diffuse intracranial atherosclerosis given globally elevated pulsatility  indexes.  TEE  LA, LA appendage without masses. LV is severely hypertrophied LVEF appears grossly normal AV is thickened, mildly calcified No AI TV is normal MV with significant annular calcification MR appears at least moderate PV is normal With injection of agitated saline there was rare large bubble seen late in LA consistent with possible tiny PFO There were few small bubbles in LA late consistent with small intrapulmonary shunt No PFO by color doppler   Component     Latest Ref Rng 07/19/2015 07/20/2015 07/21/2015  Valproic Acid Lvl     50.0 - 100.0 ug/mL 77 58 67  Ammonia     9 - 35 umol/L  33     PHYSICAL EXAM Middle-aged obese African-American lady not in distress. Sitting in chair, mildly restless. Afebrile. Head is nontraumatic. Neck is supple without bruit.  Cardiac exam no murmur or gallop. Lungs are clear to auscultation. Distal pulses are well felt.  Neurological Exam :  AAOx2, not orientated to time. Fund of knowledge impaired not knowing current president. Diminished attention, registration and recall. No aphasia or apraxia dysarthria, able to name and repeat. But psychomotor slowing. Eye movements are full range without nystagmus. Blinks to threat bilaterally. Fundi were not visualized. Vision acuity and fields seem adequate. Face is symmetric without weakness. Tongue is midline. Motor system exam reveals no upper or lower extremity drift. Sensation seems preserved bilaterally. Coordination is slow but accurate. Gait was not tested. Deep tendon reflexes are symmetric. Plantars are downgoing.     ASSESSMENT/PLAN Gail Chapman is a 59 y.o. female with history of MS, chronic bilateral leg weakness (using wheelchair), hypertension, hyperlipidemia, diabetes mellitus, diastolic congestive heart failure, pulmonary hypertension, OSA on CPAP, COPD on 3 L oxygen at home, and end-stage renal disease-HD (MWF), who was transferred from [place] with altered mental status,  dizziness, seizure. MRI found stroke during admission.   Stroke: occipital lobe and bilateral frontal lobe small infarcts, embolic pattern secondary to unknown source, hypoperfusion due to hypotension vs. Afib. Pt had hypotensive episodes correlating the time period of infarct. Most of punctate infarct support watershed distribution except one in the right MCA territory.   Resultant no significant focal deficit, but psychomotor slowing  MRI  occipital lobe and bilateral frontal lobe small infarcts  TCD diffuse athero  Carotid Doppler  1-39% bilateral stenosis  2D Echo  No source of embolus    LE venous doppler negative for DVT TEE negative for SOE Recommend either 30 day cardiac event monitoring or loop recorder to rule out afib  Heparin 5000 units sq tid for VTE prophylaxis DIET DYS 3 Room service appropriate?: Yes; Fluid consistency:: Thin  aspirin 325 mg orally every day prior to admission, now on aspirin 325 mg orally every day.   Ongoing aggressive stroke risk factor management  Therapy recommendations:  CIR recommended. Consult ordered. Husband wants to take home.  Disposition:  pending  Seizure  On depakote  EEG positive 10/4 & 10/5   EEG 10/10 showed improvement on frontal sharps but continued parietooccipital sharps with cerebral dysfunction.  Repeat EEG 10/14 no epileptiform discharges.  depakote level 77 -> 55 -> 67 -> 79  vimpat added 07/19/2015 for seizure control  Continue depakote to 750 mg bid.   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  BP 158-187/58-82 past 24h (07/23/2015 @ 7:02 PM)  Stable currently  BP goal normotensive  Hyperlipidemia  Home meds:  No statin   LDL 93, goal < 70  New pravachol 40 added this admission  Continue statin at discharge  Diabetes type II  HgbA1c 5.9, at goal < 7.0  Controlled  Other Stroke Risk Factors  Former Cigarette smoker  Coronary artery disease  Elevated troponin - card consult -  think demand ischemia in setting of CVA, HTN, and seizures  Obstructive sleep apnea, on CPAP at home, 3L O2  Other Active Problems  ESRD on HD MWF, HD on hold for today  Anemia of chronic disease  Metabolic bone disease  COPD  Chronic diastolic heart failure  Multiple sclerosis w/ chronic B leg weakness, uses w/c  Pulmonary hypertension   Pericardial effusion, chronic, small  Hospital day # 11  Marvel Plan, MD PhD Stroke Neurology 07/23/2015 7:02 PM    To contact Stroke Continuity provider, please refer to [email or web address]. After hours, contact General Neurology

## 2015-07-23 NOTE — Procedures (Signed)
Patient was seen on dialysis and the procedure was supervised. BFR 400 [address] [facility] TDC BP is 197/99.  Patient appears to be tolerating treatment well

## 2015-07-23 NOTE — Procedures (Signed)
ELECTROENCEPHALOGRAM REPORT   Patient: Gail Chapman       Age: 59 y.o.        Sex: female Referring Physician: Dr Roda Shutters Report Date:  07/23/2015        Interpreting Physician: Omelia Blackwater  History: Gail Chapman is an 59 y.o. female with AMS, found to have CVA  Medications:  Scheduled: . antiseptic oral rinse  7 mL Mouth Rinse BID  . aspirin EC  325 mg Oral Daily  . calcitRIOL  0.25 mcg Oral Q M,W,F-HD  . carvedilol  12.5 mg Oral BID WC  . cefTRIAXone (ROCEPHIN)  IV  1 g Intravenous Q24H  . darbepoetin (ARANESP) injection - DIALYSIS  60 mcg Intravenous Q Wed-HD  . divalproex  750 mg Oral Q12H  . doxazosin  2 mg Oral Daily  . heparin  5,000 Units Subcutaneous 3 times per day  . insulin aspart  0-9 Units Subcutaneous TID WC  . insulin glargine  3 Units Subcutaneous Daily  . lacosamide  50 mg Oral BID  . pantoprazole  40 mg Oral Daily  . pravastatin  40 mg Oral q1800  . regadenoson  0.4 mg Intravenous Once  . sevelamer carbonate  800 mg Oral TID WC  . sodium chloride  3 mL Intravenous Q12H    Conditions of Recording:  This is a 16 channel EEG carried out with the patient in the drowsy state.  Description:  The waking background activity consists of a low to medium voltage, symmetrical, fairly well organized, mix of delta and theta activity, seen from the parieto-occipital and posterior temporal regions. No focal slowing or epileptiform activity noted.  Hyperventilation and intermittent photic stimulation was not performed.   IMPRESSION: Abnormal EEG due to the presence of generalized irregular slowing indicating a moderate to severe cerebral disturbance (encephalopathy). No epileptiform activity noted.    Elspeth Cho, DO [facility] [phone]  If 7pm- 7am, please page neurology on call as listed in AMION. 07/23/2015, 5:02 PM

## 2015-07-23 NOTE — Consult Note (Signed)
ELECTROPHYSIOLOGY CONSULT NOTE  Patient ID: Gail Chapman MRN: [id number], DOB/AGE: 02-20-1956   Admit date: 07/12/2015 Date of Consult: 07/23/2015  Primary Physician: No PCP Per Patient Primary Cardiologist: Dr. Gala Romney  Reason for Consultation: Cryptogenic stroke, recommendations regarding Implantable Loop Recorder  History of Present Illness Gail Chapman was admitted on 07/12/2015 with seizure, hypertensive emergency and acute CVA transferred from [facility] to [facility].  She has PMH of chronic bilateral lower extremity weakness who is wheelchair bound with MS, per the husband who is at bedside has been a slow progressive decline in function since 2010, hypertension, hyperlipidemia, diabetes mellitus, diastolic heart failure and pulmonary hypertension, COPD on 3 L of oxygen, end-stage renal disease on hemodialysis, and obstructive sleep apnea. Records reviewed, she first developed symptoms observed by the spouse after using the restroom, she became confused after using bathroom at 10:00 AM. She also had dizziness and aphasia. She was noticed to have rigidity in both arms and deviated gaze to the right. The episode lasted for about 2 to 3 min.  Imaging demonstrated Acute infarcts in the occipital lobe and frontal lobes bilaterally by MRI.  She has undergone workup for stroke including echocardiogram and carotid dopplers.  The patient has been monitored on telemetry which has demonstrated sinus rhythm with no arrhythmias.    She is just back to her room s/p TEE, her husband is at bedside, she is drowsy but wakes easily and denies any kind of CP here or historically, no palpitations, and no noted history of syncope, denies any SOB.  She is hungry.  07/23/15: TEE LA, LA appendage without masses. LV is severely hypertrophied LVEF appears grossly normal AV is thickened, mildly calcified No AI TV is normal MV with significant annular calcification MR appears at least moderate PV is  normal With injection of agitated saline there was rare large bubble seen late in LA consistent with possible tiny PFO There were few small bubbles in LA late consistent with small intrapulmonary shunt No PFO by color doppler  Full report to follow   Echocardiogram this admission demonstrated Study Conclusions - Left ventricle: The cavity size was normal. There was severe concentric hypertrophy. Systolic function was normal. The estimated ejection fraction was in the range of 50% to 55%. There is akinesis of the basal-midinferior myocardium. Doppler parameters are consistent with a reversible restrictive pattern, indicative of decreased left ventricular diastolic compliance and/or increased left atrial pressure (grade 3 diastolic dysfunction). - Mitral valve: Severely calcified annulus. There was moderate regurgitation. - Left atrium: The atrium was moderately dilated. - Right ventricle: The cavity size was mildly dilated. Wall thickness was normal. - Pulmonary arteries: Systolic pressure was mildly increased. PA peak pressure: 43 mm Hg (S). - Pericardium, extracardiac: A small to moderate pericardial effusion was identified posterior to the heart (1.5cm). Features were not consistent with tamponade physiology.  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 There is restrictive physiology (although heavy MAC accurate quaitifcation) with very high left and right heart filling pressures. There is a persistent large pericardial effusion which appears free-flowing. RVSP is estimated to be 100 mmHg with moderate TR, moderate LAE and mild AI. (This was evaluated at the time by cardiology/CHF and felt to be moderate and without hemodynamic compromise and likely secondary to HD non-compliance)   Prior to admission,  the patient denies chest pain, shortness of breath,  dizziness, palpitations, or syncope.  EP has been asked to evaluate for placement of an implantable loop recorder to monitor for atrial fibrillation.     Past Medical History  Diagnosis Date  . Diabetes mellitus without complication (HCC)   . Chronic diastolic CHF (congestive heart failure) (HCC)     a. 01/2007 MV: No isch/infarct, nl EF;  b. 2012 Cath: "normal" per patient.  Performed in [place], [place] by Dr. Graciela Husbands.  . Pulmonary hypertension (HCC)     a. on home O2 @ 3lpm 24hrs/day  . HTN (hypertension)     a. Dx in 1993.  Marland Kitchen LVH (left ventricular hypertrophy)   . OSA (obstructive sleep apnea)     a. uses CPAP  . Physical deconditioning   . Renal disorder   . COPD (chronic obstructive pulmonary disease) [facility])      Surgical History:  Past Surgical History  Procedure Laterality Date  . Knee surgery    . Carpal tunnel release    . Tubal ligation    . Cholecystectomy    . Mass excision      a. under axilla.  . Subxyphoid pericardial window N/A 02/26/2013    Procedure: SUBXYPHOID PERICARDIAL WINDOW;  Surgeon: Delight Ovens, MD;  Location: [facility] OR;  Service: Thoracic;  Laterality: N/A;  . Left and right heart catheterization with coronary angiogram N/A 02/25/2013    Procedure: LEFT AND RIGHT HEART CATHETERIZATION WITH CORONARY ANGIOGRAM;  Surgeon: Dolores Patty, MD;  Location: [facility] CATH LAB;  Service: Cardiovascular;  Laterality: N/A;     Prescriptions prior to admission  Medication Sig Dispense Refill Last Dose  . aspirin EC 325 MG EC tablet Take 1 tablet (325 mg total) by mouth daily. 30 tablet 3 Past Week at Unknown time  . carvedilol (COREG) 12.5 MG tablet Take 1 tablet (12.5 mg total) by mouth 2 (two) times daily with a meal. On takes on non dialysis day. 60 tablet 3 07/13/2015 at Unknown time  . doxazosin (CARDURA) 2 MG tablet Take 1 tablet (2 mg total) by mouth daily. 30 tablet 6 07/13/2015 at Unknown time  . insulin  aspart (NOVOLOG) 100 UNIT/ML injection Inject 10 Units into the skin 3 (three) times daily with meals. Per sliding scale 10 mL 0 Past Week at Unknown time  . insulin detemir (LEVEMIR) 100 UNIT/ML injection Inject 0.04 mLs (4 Units total) into the skin at bedtime. Hold if CBG<150 10 mL 0 Past Week at Unknown time  . glucose blood (FREESTYLE LITE) test strip Use as instructed (Patient not taking: Reported on 05/05/2015) 100 each 12 Not Taking at Unknown time  . promethazine (PHENERGAN) 25 MG suppository Place 1 suppository (25 mg total) rectally at bedtime. (Patient not taking: Reported on 05/05/2015) 12 each 3 Not Taking at Unknown time    Inpatient Medications:  . antiseptic oral rinse  7 mL Mouth Rinse BID  . aspirin EC  325 mg Oral Daily  . calcitRIOL  0.25 mcg Oral Q M,W,F-HD  . carvedilol  12.5 mg Oral BID WC  . darbepoetin (ARANESP) injection - DIALYSIS  60 mcg Intravenous Q Wed-HD  . divalproex  750 mg Oral Q12H  . doxazosin  2 mg Oral Daily  . heparin  5,000 Units Subcutaneous 3 times per day  . insulin aspart  0-9 Units Subcutaneous TID WC  . insulin glargine  3 Units Subcutaneous Daily  . lacosamide  50 mg Oral BID  . pantoprazole (PROTONIX) IV  40 mg Intravenous Q24H  .  piperacillin-tazobactam (ZOSYN)  IV  2.25 g Intravenous 3 times per day  . regadenoson  0.4 mg Intravenous Once  . sevelamer carbonate  800 mg Oral TID WC  . sodium chloride  3 mL Intravenous Q12H    Allergies:  Allergies  Allergen Reactions  . Clonidine Derivatives     Hand itching  . Hydralazine     Visual disturbances   . Labetalol     Fatigue   . Vancomycin     "shut down my kidneys"    Social History   Social History  . Marital Status: Married    Spouse Name: N/A  . Number of Children: N/A  . Years of Education: N/A   Occupational History  . Not on file.   Social History Main Topics  . Smoking status: Former Games developer  . Smokeless tobacco: Not on file  . Alcohol Use: No  . Drug Use: No       Comment: "smoked weed"  . Sexual Activity: Not on file   Other Topics Concern  . Not on file   Social History Narrative   Lives in [place] with her husband.  They have 2 grown children.  She is on disability.     Family History  Problem Relation Age of Onset  . Diabetes Mother   . CAD Mother   . Cancer Mother   . Hypertension Mother   . Cancer Father   . Diabetes Father   . Hyperlipidemia Father   . Hypertension Father   . Heart attack Father       Review of Systems: All other systems reviewed and are otherwise negative except as noted above.  Physical Exam: Filed Vitals:   07/22/15 1726 07/22/15 2132 07/22/15 2307 07/23/15 0451  BP: 184/64 162/59  138/52  Pulse: 66 64 66 66  Temp: 97.9 F (36.6 C) 98.5 F (36.9 C)  98.3 F (36.8 C)  TempSrc: Oral     Resp: 18 18 16 18   Height:      Weight:      SpO2: 100% 99% 98% 97%    GEN- The patient is drowsy but wakes easily s/p TEE, appears older then her age, is oriented x 3 today.   Head- normocephalic, atraumatic Eyes-  Sclera clear, conjunctiva pink Ears- hearing intact Neck- supple with indwelling catheter in her left IJ Lungs- Clear to ausculation bilaterally, normal work of breathing Heart- Regular rate and rhythm, no murmurs, rubs or gallops  GI- soft, NT, ND, + BS Extremities- no clubbing, cyanosis, or edema Skin- no rash or lesion Psych- she is s/p sedation, is appropriate and conversational   Labs:   Lab Results  Component Value Date   WBC 9.6 07/22/2015   HGB 9.4* 07/22/2015   HCT 28.5* 07/22/2015   MCV 86.9 07/22/2015   PLT 161 07/22/2015    Recent Labs Lab 07/22/15 0620  NA 138  K 4.7  CL 98*  CO2 23  BUN 48*  CREATININE 6.52*  CALCIUM 9.1  GLUCOSE 67   Lab Results  Component Value Date   TROPONINI 0.05* 07/15/2015   Lab Results  Component Value Date   CHOL 171 07/13/2015   Lab Results  Component Value Date   HDL 67 07/13/2015   Lab Results  Component Value Date    LDLCALC 93 07/13/2015   Lab Results  Component Value Date   TRIG 54 07/13/2015   Lab Results  Component Value Date   CHOLHDL 2.6 07/13/2015  Radiology/Studies:   Ct Head Wo Contrast 07/14/2015  CLINICAL DATA:  Acute onset confusion and lethargy after dialysis. Chronic renal failure. EXAM: CT HEAD WITHOUT CONTRAST TECHNIQUE: Contiguous axial images were obtained from the base of the skull through the vertex without intravenous contrast. COMPARISON:  None. FINDINGS: Moderate diffuse atrophy is present. There is no intracranial mass, hemorrhage, extra-axial fluid collection, midline shift. There is small vessel disease throughout the centra semiovale bilaterally. There are age uncertain fairly small infarcts in the anterior left occipital and anterior superior left frontal lobes. There is a small age uncertain infarct in the superior posterior right cerebellum. There is also a small age uncertain infarct in the posterior right temporal lobe lateral and slightly posterior to the temporal horn the right lateral ventricle. There is a small age uncertain infarct in the right lentiform nucleus. Bony calvarium appears intact. The mastoid air cells are clear. There is mild mucosal thickening of several ethmoid air cells bilaterally. IMPRESSION: Atrophy with extensive periventricular small vessel disease. Age uncertain small infarcts at several sites as described. No hemorrhage or mass effect. Mild ethmoid sinus disease bilaterally. Electronically Signed   By: Bretta Bang III M.D.   On: 07/14/2015 14:25   Mr Brain Wo Contrast 07/15/2015  CLINICAL DATA:  Stroke with cerebral ischemia EXAM: MRI HEAD WITHOUT CONTRAST TECHNIQUE: Multiplanar, multiecho pulse sequences of the brain and surrounding structures were obtained without intravenous contrast. COMPARISON:  CT head 07/14/2015 FINDINGS: Multiple areas of restricted diffusion bilaterally compatible with acute infarction. Acute infarct in the occipital  lobes bilaterally left greater than right. The left-sided stroke measures up to 11 mm. Small area of acute infarct in the right frontal operculum and in the left frontal white matter. Moderate to advanced chronic microvascular ischemic changes in the white matter bilaterally. Chronic infarcts in the thalamus bilaterally. Chronic blood products in the left temporal lobe and in the pontomedullary junction compatible with prior hemorrhage. This is likely related to poorly controlled hypertension. Moderate atrophy. Ventricular enlargement consistent with atrophy. No hydrocephalus. Negative for mass lesion. Normal orbit.  Paranasal sinuses clear. Image quality degraded by motion IMPRESSION: Atrophy and moderate to advanced chronic ischemia. Chronic hemorrhage in the brainstem and left temporal lobe due to chronic hypertension Acute infarcts in the occipital lobe and frontal lobes bilaterally. Electronically Signed   By: Marlan Palau M.D.   On: 07/15/2015 14:45   Dg Chest Port 1 View 07/15/2015  CLINICAL DATA:  E86.9 (ICD-10-CM) - Volume depletion EXAM: PORTABLE CHEST 1 VIEW COMPARISON:  08/28/2014 FINDINGS: Left-sided dialysis catheter tip to the upper right atrium. Cardiopericardial silhouette is enlarged and stable. Lungs are clear. No pulmonary edema. IMPRESSION: No significant change in marked enlargement of the cardiopericardial silhouette. Electronically Signed   By: Norva Pavlov M.D.   On: 07/15/2015 10:20    12-lead ECG SR All prior EKG's in EPIC reviewed with no documented atrial fibrillation Telemetry SR, no Afib or arrhythmias noted  Assessment and Plan:  1. Cryptogenic stroke Continue as per neurology.  At this time, given findings of chronic hemorrhage on her brain MRI this would make chronic systemic anticoagulation contraindicated and therefore implantable loop recorder is not recommended.    Continue care with her primary care team: Acute  encephalopathy Sepsis CVA Vomiting Seizure End-stage renal disease on hemodialysis Accelerated hypertension Insulin-dependent diabetes mellitus COPD   Renee Norberto Sorenson, PA-C 07/23/2015   EP Attending  Patient seen and examined. Agree with above. I have modified the above note by Francis Dowse  PA-C minimally. Her findings reflect my observations. The patient has multiple medical problems including ESRD on HD, severe HTN, and is admitted with a stroke and has had cerebral hemorrhage. She also has MS. She is not a candidate for systemic anti-coagulation with her h/o cerebral hemorrhage. Would not recommend ILR. Prognosis guarded.  Leonia Reeves.D.

## 2015-07-23 NOTE — Op Note (Signed)
LA, LA appendage without masses. LV is severely hypertrophied  LVEF appears grossly normal AV is thickened, mildly calcified  No AI TV is normal MV with significant annular calcification  MR appears at least moderate PV is normal With injection of agitated saline there was rare large bubble seen late in LA consistent with possible tiny PFO  There were few small bubbles in LA late consistent with small intrapulmonary shunt No PFO by color doppler    Full report to follow

## 2015-07-23 NOTE — Progress Notes (Signed)
EEG Completed; Results Pending  

## 2015-07-24 LAB — URINE CULTURE

## 2015-07-24 LAB — GLUCOSE, CAPILLARY
GLUCOSE-CAPILLARY: 91 mg/dL (ref 65–99)
GLUCOSE-CAPILLARY: 96 mg/dL (ref 65–99)
GLUCOSE-CAPILLARY: 110 mg/dL — AB (ref 65–99)
Glucose-Capillary: 127 mg/dL — ABNORMAL HIGH (ref 65–99)

## 2015-07-24 LAB — VALPROIC ACID LEVEL: VALPROIC ACID LVL: 71 ug/mL (ref 50.0–100.0)

## 2015-07-24 NOTE — Progress Notes (Signed)
Patient Name: Gail Chapman Date of Encounter: 07/24/2015     Principal Problem:   Acute encephalopathy Active Problems:   OSA (obstructive sleep apnea)   Hypertensive emergency   Pulmonary hypertension (HCC)   Chronic diastolic CHF (congestive heart failure) (HCC)   COPD (chronic obstructive pulmonary disease) (HCC)   Seizures (HCC)   Elevated troponin   MS (multiple sclerosis) (HCC)   ESRD on dialysis (HCC)   Acute on chronic diastolic heart failure (HCC)   Cerebral embolism with cerebral infarction   Cerebral thrombosis with cerebral infarction   Stroke with cerebral ischemia (HCC)   Gait disturbance, post-stroke    SUBJECTIVE  The patient is alert.  Denies any chest pain or shortness of breath. TEE yesterday showed possible very small patent foramen ovale.  With her intracerebral hemorrhage she is not a candidate for long-term anticoagulation  CURRENT MEDS . antiseptic oral rinse  7 mL Mouth Rinse BID  . aspirin EC  325 mg Oral Daily  . calcitRIOL  0.25 mcg Oral Q M,W,F-HD  . carvedilol  12.5 mg Oral BID WC  . cefTRIAXone (ROCEPHIN)  IV  1 g Intravenous Q24H  . darbepoetin (ARANESP) injection - DIALYSIS  60 mcg Intravenous Q Wed-HD  . divalproex  750 mg Oral Q12H  . doxazosin  2 mg Oral Daily  . heparin  5,000 Units Subcutaneous 3 times per day  . insulin aspart  0-9 Units Subcutaneous TID WC  . insulin glargine  3 Units Subcutaneous Daily  . lacosamide  50 mg Oral BID  . pantoprazole  40 mg Oral Daily  . pravastatin  40 mg Oral q1800  . regadenoson  0.4 mg Intravenous Once  . sevelamer carbonate  800 mg Oral TID WC  . sodium chloride  3 mL Intravenous Q12H    OBJECTIVE  Filed Vitals:   07/23/15 1500 07/23/15 2053 07/24/15 0600 07/24/15 0744  BP: 199/89 174/69 194/72 192/70  Pulse: 77 66 60 65  Temp: 97.5 F (36.4 C) 99.6 F (37.6 C) 97.6 F (36.4 C) 98 F (36.7 C)  TempSrc: Oral   Oral  Resp: 20 17 18 17   Height:      Weight:  145 lb (65.772  kg)    SpO2: 99% 100% 100% 100%    Intake/Output Summary (Last 24 hours) at 07/24/15 1211 Last data filed at 07/24/15 0845  Gross per 24 hour  Intake    400 ml  Output      0 ml  Net    400 ml   Filed Weights   07/23/15 0712 07/23/15 1141 07/23/15 2053  Weight: 146 lb 2.6 oz (66.3 kg) 143 lb 1.3 oz (64.9 kg) 145 lb (65.772 kg)    PHYSICAL EXAM  General: Pleasant, NAD. Neuro: Alert and oriented X 3. Psych: Normal affect. HEENT:  Normal  Neck: Supple without bruits or JVD. Lungs:  Resp regular and unlabored, CTA. Heart: RRR no s3, s4, or murmurs. .  Accessory Clinical Findings  CBC  Recent Labs  07/23/15 0759 07/23/15 1600  WBC 9.5 8.6  NEUTROABS 7.4  --   HGB 8.8* 10.1*  HCT 28.1* 32.7*  MCV 88.9 89.1  PLT 178 182   Basic Metabolic Panel  Recent Labs  07/23/15 0800 07/23/15 1600  NA 133* 135  K 4.6 3.3*  CL 95* 96*  CO2 23 28  GLUCOSE 83 105*  BUN 56* 12  CREATININE 7.77* 2.98*  CALCIUM 8.1* 8.7*  PHOS 6.3* 2.6  Liver Function Tests  Recent Labs  07/23/15 0800 07/23/15 1600  ALBUMIN 2.5* 2.8*   No results for input(s): LIPASE, AMYLASE in the last 72 hours. Cardiac Enzymes No results for input(s): CKTOTAL, CKMB, CKMBINDEX, TROPONINI in the last 72 hours. BNP Invalid input(s): POCBNP D-Dimer No results for input(s): DDIMER in the last 72 hours. Hemoglobin A1C No results for input(s): HGBA1C in the last 72 hours. Fasting Lipid Panel No results for input(s): CHOL, HDL, LDLCALC, TRIG, CHOLHDL, LDLDIRECT in the last 72 hours. Thyroid Function Tests No results for input(s): TSH, T4TOTAL, T3FREE, THYROIDAB in the last 72 hours.  Invalid input(s): FREET3  TELE    ECG    Radiology/Studies  Ct Head Wo Contrast  07/14/2015  CLINICAL DATA:  Acute onset confusion and lethargy after dialysis. Chronic renal failure. EXAM: CT HEAD WITHOUT CONTRAST TECHNIQUE: Contiguous axial images were obtained from the base of the skull through the vertex  without intravenous contrast. COMPARISON:  None. FINDINGS: Moderate diffuse atrophy is present. There is no intracranial mass, hemorrhage, extra-axial fluid collection, midline shift. There is small vessel disease throughout the centra semiovale bilaterally. There are age uncertain fairly small infarcts in the anterior left occipital and anterior superior left frontal lobes. There is a small age uncertain infarct in the superior posterior right cerebellum. There is also a small age uncertain infarct in the posterior right temporal lobe lateral and slightly posterior to the temporal horn the right lateral ventricle. There is a small age uncertain infarct in the right lentiform nucleus. Bony calvarium appears intact. The mastoid air cells are clear. There is mild mucosal thickening of several ethmoid air cells bilaterally. IMPRESSION: Atrophy with extensive periventricular small vessel disease. Age uncertain small infarcts at several sites as described. No hemorrhage or mass effect. Mild ethmoid sinus disease bilaterally. Electronically Signed   By: Bretta Bang III M.D.   On: 07/14/2015 14:25   Mr Brain Wo Contrast  07/15/2015  CLINICAL DATA:  Stroke with cerebral ischemia EXAM: MRI HEAD WITHOUT CONTRAST TECHNIQUE: Multiplanar, multiecho pulse sequences of the brain and surrounding structures were obtained without intravenous contrast. COMPARISON:  CT head 07/14/2015 FINDINGS: Multiple areas of restricted diffusion bilaterally compatible with acute infarction. Acute infarct in the occipital lobes bilaterally left greater than right. The left-sided stroke measures up to 11 mm. Small area of acute infarct in the right frontal operculum and in the left frontal white matter. Moderate to advanced chronic microvascular ischemic changes in the white matter bilaterally. Chronic infarcts in the thalamus bilaterally. Chronic blood products in the left temporal lobe and in the pontomedullary junction compatible with  prior hemorrhage. This is likely related to poorly controlled hypertension. Moderate atrophy. Ventricular enlargement consistent with atrophy. No hydrocephalus. Negative for mass lesion. Normal orbit.  Paranasal sinuses clear. Image quality degraded by motion IMPRESSION: Atrophy and moderate to advanced chronic ischemia. Chronic hemorrhage in the brainstem and left temporal lobe due to chronic hypertension Acute infarcts in the occipital lobe and frontal lobes bilaterally. Electronically Signed   By: Marlan Palau M.D.   On: 07/15/2015 14:45   Dg Chest Port 1 View  07/15/2015  CLINICAL DATA:  E86.9 (ICD-10-CM) - Volume depletion EXAM: PORTABLE CHEST 1 VIEW COMPARISON:  08/28/2014 FINDINGS: Left-sided dialysis catheter tip to the upper right atrium. Cardiopericardial silhouette is enlarged and stable. Lungs are clear. No pulmonary edema. IMPRESSION: No significant change in marked enlargement of the cardiopericardial silhouette. Electronically Signed   By: Norva Pavlov M.D.   On: 07/15/2015 10:20  ASSESSMENT AND PLAN  1. Cryptogenic stroke Continue as per neurology.Possible very small PFO noted at TEE. At this time, given findings of chronic hemorrhage on her brain MRI this would make chronic systemic anticoagulation contraindicated and therefore implantable loop recorder is not recommended.   Continue care with her primary care team: Acute encephalopathy Sepsis CVA Vomiting Seizure End-stage renal disease on hemodialysis Accelerated hypertension Insulin-dependent diabetes mellitus COPD  Cardiology will sign off now  Signed, Cassell Clement MD

## 2015-07-24 NOTE — Progress Notes (Signed)
[facility] Progress Note   Gail Chapman  SEG:[id number]  DOB: 1955/11/06  DOA: 07/12/2015 PCP: No PCP Per Patient  Brief narrative: Gail Chapman is a 59 y.o. female with chronic bilateral lower extremity weakness who is wheelchair bound, hypertension, hyperlipidemia, diabetes mellitus, diastolic heart failure and pulmonary hypertension, COPD on 3 L of oxygen, end-stage renal disease on hemodialysis and obstructive sleep apnea.  The patient presented to the hospital with confusion and dizziness and a witnessed seizure. Seizure episode lasted for about 2-3 minutes. She was initially brought into [facility] ER and noted to have hypertensive urgency with a blood pressure of 260/130 and a troponin of 0.46. Cardizem infusion was started in the ER. She had admitted to missing hemodialysis on that day.  Initial CT scan of the head showed age indeterminate small infarcts. An MRI revealed chronic hemorrhage in the brainstem and left temporal lobe secondary to chronic hypertension but also acute infarcts in the occipital lobe and frontal lobes bilaterally. She was transferred to [facility] and evaluated by neurology.   Subjective: Was sleeping on CPAP, no new complaints. Seen with husband at bedside, stated that she is feeling okay today. Likely to see CIR on Monday  Assessment/Plan: Principal Problem:   Acute encephalopathy -Suspected to be secondary to accelerated hypertension, CVA and post ictal state -Still appears to be quite slow to respond to questions and intermittently is having difficulty with following commands. -Medications including Depakote, Vimpat and Doxil Zosyn can cause somnolence  Sepsis, secondary to UTI -Had a fever of 101.9 with heart rate of 104 yesterday consistent with sepsis. -Blood and urine obtained, urinalysis consistent with UTI. -Started on Zosyn and daptomycin (is allergic to vancomycin). No fever or chills today. -Urine culture  showed Escherichia coli, daptomycin/Zosyn discontinued, Rocephin started.    CVA - b/l frontal and occipital infarcts -Neurology suspects a cardioembolic source vs due to hypotension -  lower extremity duplex negative for DVT -Carotid duplex does not reveal any significant stenosis -Transcranial Doppler consistent with diffuse intracranial atherosclerosis  -Transthoracic echo did not reveal a thrombus, TEE done on 07/23/15 showed no evidence of intracardiac thrombus. -EP cardiology consulted, recommended to defer doing the loop recorder. -The justification for that even if the patient has atrial fibrillation, patient has history of intracranial hemorrhage will contraindicate anticoagulation.  Vomiting - Diabetic gastroparesis? Essentially vomited up a small amount of bile/gastric fluid as she had not eaten any breakfast - Resolved. - PRN antiemetics  Seizure -Continue Depakote per neurology - Vimpat added. -EEG reveals an epileptogenic potential  End-stage renal disease on hemodialysis -Dialysis Monday Wednesday Friday-nephrology following  Accelerated hypertension - long h/o uncontrolled BP, 2-D echo showed severely concentric hypertrophied left ventricle. -improving with adjustment of antihypertensives  -cont Cardura and carvedilol- IV Lopressor ordered to use as needed - hydralazine, labetalol and clonidine mentioned as allergies  Insulin-dependent diabetes mellitus -Continue Levemir and sliding scale insulin  Mild troponin elevation/ focal akinesis on ECHO  - see ECHO report below -Evaluated by cardiology - troponin elevated suspected to be demand ischemia - further work up for focal hypokinesis, per cardiology, to be done at a later date pending completion of neuro w/u   COPD -Chronically on 3 L of oxygen at home  Mod Pericardial effusion - chronic- no tamponade  Generalized weakness PT recommended skilled nursing facility-  Code Status:     Code Status Orders         Start     Ordered  07/12/15 2046  Full code   Continuous     07/12/15 2047     Family Communication: With husband  Disposition Plan:   skilled nursing facility - social work consulted DVT prophylaxis: Heparin  Consultants:Neurology, nephrology  Procedures: EEG 10/4 Carotid duplex 2-D echo - ejection fraction was in the range of 50% to 55%. - reversible restrictive pattern, (grade 3 diastolic dysfunction) - akinesis of the basal-midinferior myocardium - Mitral valve: Severely calcified annulus. There was moderate regurgitation. - Left atrium: The atrium was moderately dilated. - Right ventricle: The cavity size was mildly dilated. Wall thickness was normal. - Pulmonary arteries: Systolic pressure was mildly increased. PA peak pressure: 43 mm Hg (S). - Pericardium, extracardiac: A small to moderate pericardial effusion was identified posterior to the heart (1.5cm). Features were not consistent with tamponade physiology.  Antibiotics: Anti-infectives    Start     Dose/Rate Route Frequency Ordered Stop   07/23/15 1600  cefTRIAXone (ROCEPHIN) 1 g in dextrose 5 % 50 mL IVPB     1 g 100 mL/hr over 30 Minutes Intravenous Every 24 hours 07/23/15 1314     07/21/15 2200  piperacillin-tazobactam (ZOSYN) IVPB 2.25 g  Status:  Discontinued     2.25 g 100 mL/hr over 30 Minutes Intravenous 3 times per day 07/21/15 1749 07/23/15 1313   07/21/15 1830  DAPTOmycin (CUBICIN) 540 mg in sodium chloride 0.9 % IVPB     8 mg/kg  67.5 kg 221.6 mL/hr over 30 Minutes Intravenous  Once 07/21/15 1749 07/21/15 2316      Objective: Filed Weights   07/23/15 0712 07/23/15 1141 07/23/15 2053  Weight: 66.3 kg (146 lb 2.6 oz) 64.9 kg (143 lb 1.3 oz) 65.772 kg (145 lb)    Intake/Output Summary (Last 24 hours) at 07/24/15 1123 Last data filed at 07/24/15 0845  Gross per 24 hour  Intake    400 ml  Output   1004 ml  Net   -604 ml     Vitals Filed Vitals:   07/23/15 1500  07/23/15 2053 07/24/15 0600 07/24/15 0744  BP: 199/89 174/69 194/72 192/70  Pulse: 77 66 60 65  Temp: 97.5 F (36.4 C) 99.6 F (37.6 C) 97.6 F (36.4 C) 98 F (36.7 C)  TempSrc: Oral   Oral  Resp: Height:      Weight:  65.772 kg (145 lb)    SpO2: 99% 100% 100% 100%    Exam:  General:  Pt is alert, not in acute distress  HEENT: No icterus, No thrush, oral mucosa moist  Cardiovascular: regular rate and rhythm, S1/S2 No murmur  Respiratory: clear to auscultation bilaterally   Abdomen: Soft, +Bowel sounds, non tender, non distended, no guarding  MSK: No LE edema, cyanosis or clubbing  Neuro: psychomotor slowing noted; Left arm and leg 4 out of 5 in strength-right arm and leg 5 over 5-cranial nerves II through XII intact  Data Reviewed: Basic Metabolic Panel:  Recent Labs Lab 07/18/15 0133  07/19/15 0650 07/20/15 0615 07/21/15 0452 07/22/15 0620 07/23/15 0800 07/23/15 1600  NA 134*  < > 133* 135 134* 138 133* 135  K 4.7  < > 5.3* 4.2 4.7 4.7 4.6 3.3*  CL 98*  < > 97* 97* 94* 98* 95* 96*  CO2 23  < > GLUCOSE 105*  < > 95 85 84 67 83 105*  BUN 35*  < > 59* 24* 36* 48* 56* 12  CREATININE 5.25*  < > 6.50* 3.89* 5.14* 6.52* 7.77* 2.98*  CALCIUM 8.5*  < > 8.3* 8.4* 9.1 9.1 8.1* 8.7*  PHOS 6.5*  --  7.4*  --   --   --  6.3* 2.6  < > = values in this interval not displayed. Liver Function Tests:  Recent Labs Lab 07/18/15 0133 07/19/15 0650 07/23/15 0800 07/23/15 1600  ALBUMIN 2.7* 2.7* 2.5* 2.8*   No results for input(s): LIPASE, AMYLASE in the last 168 hours.  Recent Labs Lab 07/20/15 0710  AMMONIA 33   CBC:  Recent Labs Lab 07/20/15 0615 07/21/15 0452 07/22/15 0620 07/23/15 0759 07/23/15 1600  WBC 5.3 7.8 9.6 9.5 8.6  NEUTROABS  --   --   --  7.4  --   HGB 9.6* 10.2* 9.4* 8.8* 10.1*  HCT 31.6* 31.7* 28.5* 28.1* 32.7*  MCV 89.8 88.8 86.9 88.9 89.1  PLT 173 177 161 178 182   Cardiac Enzymes: No results for  input(s): CKTOTAL, CKMB, CKMBINDEX, TROPONINI in the last 168 hours. BNP (last 3 results)  Recent Labs  07/13/15 0344  BNP >4500.0*    ProBNP (last 3 results) No results for input(s): PROBNP in the last 8760 hours.  CBG:  Recent Labs Lab 07/22/15 2123 07/23/15 1457 07/23/15 1643 07/23/15 2051 07/24/15 0743  GLUCAP 103* 75 119* 123* 91    Recent Results (from the past 240 hour(s))  MRSA PCR Screening     Status: None   Collection Time: 07/14/15  3:05 PM  Result Value Ref Range Status   MRSA by PCR NEGATIVE NEGATIVE Final    Comment:        The GeneXpert MRSA Assay (FDA approved for NASAL specimens only), is one component of a comprehensive MRSA colonization surveillance program. It is not intended to diagnose MRSA infection nor to guide or monitor treatment for MRSA infections.   Culture, blood (routine x 2)     Status: None (Preliminary result)   Collection Time: 07/21/15  6:35 PM  Result Value Ref Range Status   Specimen Description BLOOD RIGHT ANTECUBITAL  Final   Special Requests BOTTLES DRAWN AEROBIC AND ANAEROBIC R 3CC B 5CC  Final   Culture NO GROWTH 2 DAYS  Final   Report Status PENDING  Incomplete  Culture, blood (routine x 2)     Status: None (Preliminary result)   Collection Time: 07/21/15  6:40 PM  Result Value Ref Range Status   Specimen Description BLOOD RIGHT HAND  Final   Special Requests IN PEDIATRIC BOTTLE 2CC  Final   Culture NO GROWTH 2 DAYS  Final   Report Status PENDING  Incomplete  Urine culture     Status: None (Preliminary result)   Collection Time: 07/21/15 11:52 PM  Result Value Ref Range Status   Specimen Description URINE, CATHETERIZED  Final   Special Requests NONE  Final   Culture >=100,000 COLONIES/mL ESCHERICHIA COLI  Final   Report Status PENDING  Incomplete     Studies: No results found.  Scheduled Meds:  Scheduled Meds: . antiseptic oral rinse  7 mL Mouth Rinse BID  . aspirin EC  325 mg Oral Daily  . calcitRIOL   0.25 mcg Oral Q M,W,F-HD  . carvedilol  12.5 mg Oral BID WC  . cefTRIAXone (ROCEPHIN)  IV  1 g Intravenous Q24H  . darbepoetin (ARANESP) injection - DIALYSIS  60 mcg Intravenous Q Wed-HD  . divalproex  750 mg Oral Q12H  . doxazosin  2 mg Oral  Daily  . heparin  5,000 Units Subcutaneous 3 times per day  . insulin aspart  0-9 Units Subcutaneous TID WC  . insulin glargine  3 Units Subcutaneous Daily  . lacosamide  50 mg Oral BID  . pantoprazole  40 mg Oral Daily  . pravastatin  40 mg Oral q1800  . regadenoson  0.4 mg Intravenous Once  . sevelamer carbonate  800 mg Oral TID WC  . sodium chloride  3 mL Intravenous Q12H   Continuous Infusions:    Time spent on care of this patient: 35 min   Merelyn Klump A, MD 07/24/2015, 11:23 AM  LOS: 12 days   [facility] Office  [phone] Pager - Text Page per [email or web address] If 7PM-7AM, please contact night-coverage [email or web address]

## 2015-07-24 NOTE — Progress Notes (Signed)
Patient ID: Gail Chapman, female   DOB: Jun 30, 1956, 59 y.o.   MRN: [id number]  [facility] Progress Note    Subjective:   Feels better and wants to go home   Objective:   BP 192/70 mmHg  Pulse 65  Temp(Src) 98 F (36.7 C) (Oral)  Resp 17  Ht 5' 1.5" (1.562 m)  Wt 65.772 kg (145 lb)  BMI 26.96 kg/m2  SpO2 100%  Intake/Output: I/O last 3 completed shifts: In: 330 [P.O.:280; IV Piggyback:50] Out: 1004 [Other:1004]   Intake/Output this shift:  Total I/O In: 120 [P.O.:120] Out: -  Weight change:   Physical Exam: Gen:WD WN AAF in NAD CVS: no rub Resp:cta Abd:+BS, soft, NT Ext: 1+ edema  Labs: BMET  Recent Labs Lab 07/18/15 0133 07/19/15 0508 07/19/15 0650 07/20/15 0615 07/21/15 0452 07/22/15 0620 07/23/15 0800 07/23/15 1600  NA 134* 134* 133* 135 134* 138 133* 135  K 4.7 5.2* 5.3* 4.2 4.7 4.7 4.6 3.3*  CL 98* 96* 97* 97* 94* 98* 95* 96*  CO2 23 24 24 28 24 23 23 28   GLUCOSE 105* 91 95 85 84 67 83 105*  BUN 35* 57* 59* 24* 36* 48* 56* 12  CREATININE 5.25* 6.60* 6.50* 3.89* 5.14* 6.52* 7.77* 2.98*  ALBUMIN 2.7*  --  2.7*  --   --   --  2.5* 2.8*  CALCIUM 8.5* 8.4* 8.3* 8.4* 9.1 9.1 8.1* 8.7*  PHOS 6.5*  --  7.4*  --   --   --  6.3* 2.6   CBC  Recent Labs Lab 07/21/15 0452 07/22/15 0620 07/23/15 0759 07/23/15 1600  WBC 7.8 9.6 9.5 8.6  NEUTROABS  --   --  7.4  --   HGB 10.2* 9.4* 8.8* 10.1*  HCT 31.7* 28.5* 28.1* 32.7*  MCV 88.8 86.9 88.9 89.1  PLT 177 161 178 182    @IMGRELPRIORS @ Medications:    . antiseptic oral rinse  7 mL Mouth Rinse BID  . aspirin EC  325 mg Oral Daily  . calcitRIOL  0.25 mcg Oral Q M,W,F-HD  . carvedilol  12.5 mg Oral BID WC  . cefTRIAXone (ROCEPHIN)  IV  1 g Intravenous Q24H  . darbepoetin (ARANESP) injection - DIALYSIS  60 mcg Intravenous Q Wed-HD  . divalproex  750 mg Oral Q12H  . doxazosin  2 mg Oral Daily  . heparin  5,000 Units Subcutaneous 3 times per day  . insulin aspart  0-9 Units  Subcutaneous TID WC  . insulin glargine  3 Units Subcutaneous Daily  . lacosamide  50 mg Oral BID  . pantoprazole  40 mg Oral Daily  . pravastatin  40 mg Oral q1800  . regadenoson  0.4 mg Intravenous Once  . sevelamer carbonate  800 mg Oral TID WC  . sodium chloride  3 mL Intravenous Q12H   Dialysis Orders: MWF [facility] 4 hrs 70.5kgs (new EDW is 65.5kg) 2K/2.5CA L IJ Cath (does not have any perm access) Heparin none Micera 75q 2 weeks Calcitriol 0.25   Assessment/ Plan:   Assessment/Plan: 1. Seizures (new) / bilat acute CVA's - on depakote and vimpat, EEG + but showed some improvement on 10/10. New acute scattered CVA's by MRI. Cardiology suspects intermittent afib. LE doppler no DVT. TEE cancelled but improved clinically so reschedule per cardiology. 1. Her mental status improved after changing to po depakote  2. Appreciate neurology input. 3. Will need to prevent drops in BP while on HD to prevent recurrent watershed  infarcts (pt with severe LVH and is very sensitive to volume removal 2. AMS - unclear if this is related to seizure meds, ongoing seizures, or progression of cva, bp normal but still with AMS. Neuro switched to po depakote with some improvement this morning. Continue to eval. 3. Elevated troponin- cardiology following. ECHO EF 50-55% 4. ESRD - MWF [place]. HD- Below dry wt. Need to limit fluid removal due to severe LVH and sensitive BP with UF. 5. Hypertension/volume - BP's variable SBP 130-190s- symptomatic with BP drop- runs 180s at home. 3 kg under dry wt No UF w HD for now.  6. Anemia - hgb 10.1- on micera outpt- start Aranesp here 7. Metabolic bone disease - Corr ca 9.5 Cont renvela. And calcitriol per outpt orders 8. Vascular access - using PC, no permanent access. Will defer to her primary nephrologist in [place]. 9. Nutrition - dys diet. alb 2.7 10. DM- per primary    Earley Grobe A 07/24/2015, 8:58 AM

## 2015-07-24 NOTE — Progress Notes (Signed)
STROKE TEAM PROGRESS NOTE   SUBJECTIVE (INTERVAL HISTORY) Husband at bedside. Pt sitting in chair comfortably. No acute issue overnight. Loop recorder declined due to pt may not be a good anticoagulation candidate.    OBJECTIVE Temp:  [97.5 F (36.4 C)-99.6 F (37.6 C)] 98 F (36.7 C) (10/15 0744) Pulse Rate:  [60-77] 65 (10/15 0744) Cardiac Rhythm:  [-] Normal sinus rhythm (10/15 0704) Resp:  [17-20] 17 (10/15 0744) BP: (174-199)/(69-89) 192/70 mmHg (10/15 0744) SpO2:  [99 %-100 %] 100 % (10/15 0744) Weight:  [145 lb (65.772 kg)] 145 lb (65.772 kg) (10/14 2053)  CBC:   Recent Labs Lab 07/23/15 0759 07/23/15 1600  WBC 9.5 8.6  NEUTROABS 7.4  --   HGB 8.8* 10.1*  HCT 28.1* 32.7*  MCV 88.9 89.1  PLT 178 182    Basic Metabolic Panel:   Recent Labs Lab 07/23/15 0800 07/23/15 1600  NA 133* 135  K 4.6 3.3*  CL 95* 96*  CO2 23 28  GLUCOSE 83 105*  BUN 56* 12  CREATININE 7.77* 2.98*  CALCIUM 8.1* 8.7*  PHOS 6.3* 2.6    IMAGING I have personally reviewed the radiological images below and agree with the radiology interpretations.  Ct Head Wo Contrast 07/14/2015    Atrophy with extensive periventricular small vessel disease. Age uncertain small infarcts at several sites as described. No hemorrhage or mass effect. Mild ethmoid sinus disease bilaterally.     Mr Brain Wo Contrast 07/15/2015    Atrophy and moderate to advanced chronic ischemia. Chronic hemorrhage in the brainstem and left temporal lobe due to chronic hypertension  Acute infarcts in the occipital lobe and frontal lobes bilaterally.     2D Echocardiogram   - Left ventricle: The cavity size was normal. There was severeconcentric hypertrophy. Systolic function was normal. Theestimated ejection fraction was in the range of 50% to 55%. Thereis akinesis of the basal-midinferior myocardium. Dopplerparameters are consistent with a reversible restrictive pattern,indicative of decreased left ventricular diastolic  complianceand/or increased left atrial pressure (grade 3 diastolicdysfunction). - Mitral valve: Severely calcified annulus. There was moderateregurgitation. - Left atrium: The atrium was moderately dilated. - Right ventricle: The cavity size was mildly dilated. Wallthickness was normal. - Pulmonary arteries: Systolic pressure was mildly increased. PApeak pressure: 43 mm Hg (S). - Pericardium, extracardiac: A small to moderate pericardialeffusion was identified posterior to the heart (1.5cm). Featureswere not consistent with tamponade physiology.  EEG 07/13/15 This is an abnormal EEG secondary to focal sharp wave activity in the frontal region on the left with phase reversal at F7. This finding is suggestive of a focal disturbance with epileptogenic potential.   EEG 07/14/15 This is an abnormal EEG secondary to focal sharp wave activity in the frontal regions bilaterally (left more than right) with phase reversal at [facility] most predominantly. This finding is suggestive of a focal disturbance with epileptogenic potential.  EEG 07/19/15 - 1) bilateral independent parietooccipital sharp waves. 2) Generalized irregular delta activity occurring in bursts. Clinical Interpretation: This EEG is consistent with bilateral areas of potential epileptogenicity in the parietooccipital regions in the setting of a generalized non-specific cerebral dysfunction(encephalopathy). In comparison to the previous study, there has been improvement in the frontotemporal sharp waves, but persistence of the parieto-occipital sharps.  EEG 07/23/15 - Abnormal EEG due to the presence of generalized irregular slowing indicating a moderate to severe cerebral disturbance (encephalopathy). No epileptiform activity noted.   Carotid US - Bilateral: 1-39% ICA stenosis. Vertebral artery flow is antegrade.  LE venous doppler -  no DVT  TCD Low normal mean flow velocities in majority of identified vessels of anterior and posterior  circulation due to suboptimal windows and suspect diffuse intracranial atherosclerosis given globally elevated pulsatility indexes.  TEE  LA, LA appendage without masses. LV is severely hypertrophied LVEF appears grossly normal AV is thickened, mildly calcified No AI TV is normal MV with significant annular calcification MR appears at least moderate PV is normal With injection of agitated saline there was rare large bubble seen late in LA consistent with possible tiny PFO There were few small bubbles in LA late consistent with small intrapulmonary shunt No PFO by color doppler   Component     Latest Ref Rng 07/19/2015 07/20/2015 07/21/2015  Valproic Acid Lvl     50.0 - 100.0 ug/mL 77 58 67  Ammonia     9 - 35 umol/L  33     PHYSICAL EXAM Middle-aged obese African-American lady not in distress. Sitting in chair, mildly restless. Afebrile. Head is nontraumatic. Neck is supple without bruit.  Cardiac exam no murmur or gallop. Lungs are clear to auscultation. Distal pulses are well felt.  Neurological Exam :  AAOx2, not orientated to time. Fund of knowledge impaired not knowing current president. Diminished attention, registration and recall. No aphasia or apraxia dysarthria, able to name and repeat. But psychomotor slowing. Eye movements are full range without nystagmus. Blinks to threat bilaterally. Fundi were not visualized. Vision acuity and fields seem adequate. Face is symmetric without weakness. Tongue is midline. Motor system exam reveals no upper or lower extremity drift. Sensation seems preserved bilaterally. Coordination is slow but accurate. Gait was not tested. Deep tendon reflexes are symmetric. Plantars are downgoing.     ASSESSMENT/PLAN [name] is a 59 y.o. female with history of MS, chronic bilateral leg weakness (using wheelchair), hypertension, hyperlipidemia, diabetes mellitus, diastolic congestive heart failure, pulmonary hypertension, OSA on CPAP, COPD  on 3 L oxygen at home, and end-stage renal disease-HD (MWF), who was transferred from [place] with altered mental status, dizziness, seizure. MRI found stroke during admission.   Stroke: occipital lobe and bilateral frontal lobe small infarcts, embolic pattern secondary to hypoperfusion due to hypotension vs. Afib. Pt had hypotensive episodes correlating the time period of infarct. Most of punctate infarct support watershed distribution except one in the right MCA territory.   Resultant no significant focal deficit, but psychomotor slowing  MRI  occipital lobe and bilateral frontal lobe small infarcts  TCD diffuse athero  Carotid Doppler  1-39% bilateral stenosis  2D Echo  No source of embolus    LE venous doppler negative for DVT TEE negative for SOE Recommend 30 day cardiac event monitoring as outpt to rule out afib.  Heparin 5000 units sq tid for VTE prophylaxis DIET DYS 3 Room service appropriate?: Yes; Fluid consistency:: Thin  aspirin 325 mg orally every day prior to admission, now on aspirin 325 mg orally every day.   Ongoing aggressive stroke risk factor management  Therapy recommendations:  CIR recommended. Consult ordered. Husband wants to take home.  Disposition:  pending  Seizure  On depakote  EEG positive 10/4 & 10/5   EEG 10/10 showed improvement on frontal sharps but continued parietooccipital sharps with cerebral dysfunction.  Repeat EEG 10/14 no epileptiform discharges.  depakote level 77 -> 55 -> 67 -> 79 -> 71  vimpat added 07/19/2015 for seizure control, but husband stated he did not give to pt for the last 2-3 days. Will d/c vimpat.  Continue  depakote to 750 mg bid.   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  BP 158-187/58-82 past 24h (07/24/2015 @ 2:36 PM)  Stable currently  BP goal normotensive  Hyperlipidemia  Home meds:  No statin   LDL 93, goal < 70  New pravachol 40 added this admission  Continue statin at  discharge  Diabetes type II  HgbA1c 5.9, at goal < 7.0  Controlled  Other Stroke Risk Factors  Former Cigarette smoker  Coronary artery disease  Elevated troponin - card consult - think demand ischemia in setting of CVA, HTN, and seizures  Obstructive sleep apnea, on CPAP at home, 3L O2  Other Active Problems  ESRD on HD MWF, HD on hold for today  Anemia of chronic disease  Metabolic bone disease  COPD  Chronic diastolic heart failure  Multiple sclerosis w/ chronic B leg weakness, uses w/c  Pulmonary hypertension   Pericardial effusion, chronic, small  Hospital day # 12  Neurology will sign off. Please call with questions. Pt will follow up with Dr. Roda Shutters at [facility] in about 2 months. Thanks for the consult.  Marvel Plan, MD PhD Stroke Neurology 07/24/2015 2:36 PM    To contact Stroke Continuity provider, please refer to [email or web address]. After hours, contact General Neurology

## 2015-07-24 NOTE — Progress Notes (Signed)
Family member wants am meds given, not the prn metoprolol IV. For elevated b/p.

## 2015-07-24 NOTE — Progress Notes (Signed)
RT note:Placed patient on CPAP previus settings. Patient resting comfortably vital signs are stable.

## 2015-07-25 LAB — GLUCOSE, CAPILLARY
GLUCOSE-CAPILLARY: 94 mg/dL (ref 65–99)
GLUCOSE-CAPILLARY: 95 mg/dL (ref 65–99)
GLUCOSE-CAPILLARY: 98 mg/dL (ref 65–99)
Glucose-Capillary: 93 mg/dL (ref 65–99)

## 2015-07-25 LAB — VALPROIC ACID LEVEL: VALPROIC ACID LVL: 94 ug/mL (ref 50.0–100.0)

## 2015-07-25 NOTE — Progress Notes (Signed)
Placed patient on CPAP for the night.  Patient is tolerating well at this time. 

## 2015-07-25 NOTE — Progress Notes (Signed)
[facility] Progress Note   Karine Garn [place]wood  ZOX:[id number]  DOB: 1956-04-14  DOA: 07/12/2015 PCP: No PCP Per Patient  Brief narrative: Gail Chapman is a 59 y.o. female with chronic bilateral lower extremity weakness who is wheelchair bound, hypertension, hyperlipidemia, diabetes mellitus, diastolic heart failure and pulmonary hypertension, COPD on 3 L of oxygen, end-stage renal disease on hemodialysis and obstructive sleep apnea.  The patient presented to the hospital with confusion and dizziness and a witnessed seizure. Seizure episode lasted for about 2-3 minutes. She was initially brought into [facility] ER and noted to have hypertensive urgency with a blood pressure of 260/130 and a troponin of 0.46. Cardizem infusion was started in the ER. She had admitted to missing hemodialysis on that day.  Initial CT scan of the head showed age indeterminate small infarcts. An MRI revealed chronic hemorrhage in the brainstem and left temporal lobe secondary to chronic hypertension but also acute infarcts in the occipital lobe and frontal lobes bilaterally. She was transferred to [facility] and evaluated by neurology.   Subjective: Seen with husband at bedside, no complains or changes clinically. Likely to see CIR on Monday.  Assessment/Plan:  Acute encephalopathy -Suspected to be secondary to accelerated hypertension, CVA and post ictal state -Still appears to be quite slow to respond to questions and intermittently is having difficulty with following commands. -Medications including Depakote, Vimpat and Doxil Zosyn can cause somnolence  Sepsis, secondary to UTI -Had a fever of 101.9 with heart rate of 104 yesterday consistent with sepsis. -Blood and urine obtained, urinalysis consistent with UTI. -Started on Zosyn and daptomycin (is allergic to vancomycin). No fever or chills today. -Urine culture showed Escherichia coli, daptomycin/Zosyn discontinued, Rocephin  started.  CVA - b/l frontal and occipital infarcts -Neurology suspects a cardioembolic source vs due to hypotension -  lower extremity duplex negative for DVT -Carotid duplex does not reveal any significant stenosis -Transcranial Doppler consistent with diffuse intracranial atherosclerosis  -Transthoracic echo did not reveal a thrombus, TEE done on 07/23/15 showed no evidence of intracardiac thrombus. -EP cardiology consulted, recommended to defer doing the loop recorder. -The justification for that even if the patient has atrial fibrillation, patient has history of intracranial hemorrhage will contraindicate anticoagulation.  Vomiting - Vomited previously, which is likely secondary to diabetic gastroparesis. -This is happens at home per husband, resolved, treat symptomatically with antiemetics when necessary  Seizure -Continue Depakote per neurology - Vimpat added. -EEG reveals an epileptogenic potential  End-stage renal disease on hemodialysis -Dialysis Monday Wednesday Friday-nephrology following  Accelerated hypertension - long h/o uncontrolled BP, 2-D echo showed severely concentric hypertrophied left ventricle. -improving with adjustment of antihypertensives  -cont Cardura and carvedilol- IV Lopressor ordered to use as needed - hydralazine, labetalol and clonidine mentioned as allergies  Insulin-dependent diabetes mellitus -Continue Levemir and sliding scale insulin  Mild troponin elevation/ focal akinesis on ECHO  - see ECHO report below -Evaluated by cardiology - troponin elevated suspected to be demand ischemia - further work up for focal hypokinesis, per cardiology, to be done at a later date pending completion of neuro w/u   COPD -Chronically on 3 L of oxygen at home  Mod Pericardial effusion - chronic- no tamponade  Generalized weakness PT recommended skilled nursing facility-  Code Status:     Code Status Orders        Start     Ordered   07/12/15  2046  Full code   Continuous     07/12/15  2047     Family Communication: With husband  Disposition Plan:   skilled nursing facility - social work consulted DVT prophylaxis: Heparin  Consultants:Neurology, nephrology  Procedures: EEG 10/4 Carotid duplex 2-D echo - ejection fraction was in the range of 50% to 55%. - reversible restrictive pattern, (grade 3 diastolic dysfunction) - akinesis of the basal-midinferior myocardium - Mitral valve: Severely calcified annulus. There was moderate regurgitation. - Left atrium: The atrium was moderately dilated. - Right ventricle: The cavity size was mildly dilated. Wall thickness was normal. - Pulmonary arteries: Systolic pressure was mildly increased. PA peak pressure: 43 mm Hg (S). - Pericardium, extracardiac: A small to moderate pericardial effusion was identified posterior to the heart (1.5cm). Features were not consistent with tamponade physiology.  Antibiotics: Anti-infectives    Start     Dose/Rate Route Frequency Ordered Stop   07/23/15 1600  cefTRIAXone (ROCEPHIN) 1 g in dextrose 5 % 50 mL IVPB     1 g 100 mL/hr over 30 Minutes Intravenous Every 24 hours 07/23/15 1314     07/21/15 2200  piperacillin-tazobactam (ZOSYN) IVPB 2.25 g  Status:  Discontinued     2.25 g 100 mL/hr over 30 Minutes Intravenous 3 times per day 07/21/15 1749 07/23/15 1313   07/21/15 1830  DAPTOmycin (CUBICIN) 540 mg in sodium chloride 0.9 % IVPB     8 mg/kg  67.5 kg 221.6 mL/hr over 30 Minutes Intravenous  Once 07/21/15 1749 07/21/15 2316      Objective: Filed Weights   07/23/15 1141 07/23/15 2053 07/24/15 2117  Weight: 64.9 kg (143 lb 1.3 oz) 65.772 kg (145 lb) 65.59 kg (144 lb 9.6 oz)    Intake/Output Summary (Last 24 hours) at 07/25/15 1159 Last data filed at 07/25/15 0930  Gross per 24 hour  Intake    660 ml  Output      0 ml  Net    660 ml     Vitals Filed Vitals:   07/24/15 2117 07/24/15 2211 07/25/15 0506 07/25/15 0724   BP: 183/69  215/76 185/73  Pulse: 69 71 67 64  Temp: 97.8 F (36.6 C)  98.3 F (36.8 C)   TempSrc: Oral  Oral   Resp: Height:      Weight: 65.59 kg (144 lb 9.6 oz)     SpO2: 100% 99% 100%     Exam:  General:  Pt is alert, not in acute distress  HEENT: No icterus, No thrush, oral mucosa moist  Cardiovascular: regular rate and rhythm, S1/S2 No murmur  Respiratory: clear to auscultation bilaterally   Abdomen: Soft, +Bowel sounds, non tender, non distended, no guarding  MSK: No LE edema, cyanosis or clubbing  Neuro: psychomotor slowing noted; Left arm and leg 4 out of 5 in strength-right arm and leg 5 over 5-cranial nerves II through XII intact  Data Reviewed: Basic Metabolic Panel:  Recent Labs Lab 07/19/15 0650 07/20/15 0615 07/21/15 0452 07/22/15 0620 07/23/15 0800 07/23/15 1600  NA 133* 135 134* 138 133* 135  K 5.3* 4.2 4.7 4.7 4.6 3.3*  CL 97* 97* 94* 98* 95* 96*  CO2 GLUCOSE 95 85 84 67 83 105*  BUN 59* 24* 36* 48* 56* 12  CREATININE 6.50* 3.89* 5.14* 6.52* 7.77* 2.98*  CALCIUM 8.3* 8.4* 9.1 9.1 8.1* 8.7*  PHOS 7.4*  --   --   --  6.3* 2.6   Liver Function Tests:  Recent Labs Lab 07/19/15  1660 07/23/15 0800 07/23/15 1600  ALBUMIN 2.7* 2.5* 2.8*   No results for input(s): LIPASE, AMYLASE in the last 168 hours.  Recent Labs Lab 07/20/15 0710  AMMONIA 33   CBC:  Recent Labs Lab 07/20/15 0615 07/21/15 0452 07/22/15 0620 07/23/15 0759 07/23/15 1600  WBC 5.3 7.8 9.6 9.5 8.6  NEUTROABS  --   --   --  7.4  --   HGB 9.6* 10.2* 9.4* 8.8* 10.1*  HCT 31.6* 31.7* 28.5* 28.1* 32.7*  MCV 89.8 88.8 86.9 88.9 89.1  PLT 173 177 161 178 182   Cardiac Enzymes: No results for input(s): CKTOTAL, CKMB, CKMBINDEX, TROPONINI in the last 168 hours. BNP (last 3 results)  Recent Labs  07/13/15 0344  BNP >4500.0*    ProBNP (last 3 results) No results for input(s): PROBNP in the last 8760 hours.  CBG:  Recent  Labs Lab 07/24/15 0743 07/24/15 1153 07/24/15 1639 07/24/15 2115 07/25/15 0818  GLUCAP 91 96 127* 110* 93    Recent Results (from the past 240 hour(s))  Culture, blood (routine x 2)     Status: None (Preliminary result)   Collection Time: 07/21/15  6:35 PM  Result Value Ref Range Status   Specimen Description BLOOD RIGHT ANTECUBITAL  Final   Special Requests BOTTLES DRAWN AEROBIC AND ANAEROBIC R 3CC B 5CC  Final   Culture NO GROWTH 3 DAYS  Final   Report Status PENDING  Incomplete  Culture, blood (routine x 2)     Status: None (Preliminary result)   Collection Time: 07/21/15  6:40 PM  Result Value Ref Range Status   Specimen Description BLOOD RIGHT HAND  Final   Special Requests IN PEDIATRIC BOTTLE 2CC  Final   Culture NO GROWTH 3 DAYS  Final   Report Status PENDING  Incomplete  Urine culture     Status: None   Collection Time: 07/21/15 11:52 PM  Result Value Ref Range Status   Specimen Description URINE, CATHETERIZED  Final   Special Requests NONE  Final   Culture >=100,000 COLONIES/mL ESCHERICHIA COLI  Final   Report Status 07/24/2015 FINAL  Final   Organism ID, Bacteria ESCHERICHIA COLI  Final      Susceptibility   Escherichia coli - MIC*    AMPICILLIN >=32 RESISTANT Resistant     CEFAZOLIN <=4 SENSITIVE Sensitive     CEFTRIAXONE <=1 SENSITIVE Sensitive     CIPROFLOXACIN <=0.25 SENSITIVE Sensitive     GENTAMICIN <=1 SENSITIVE Sensitive     IMIPENEM <=0.25 SENSITIVE Sensitive     NITROFURANTOIN <=16 SENSITIVE Sensitive     TRIMETH/SULFA <=20 SENSITIVE Sensitive     AMPICILLIN/SULBACTAM 16 INTERMEDIATE Intermediate     PIP/TAZO <=4 SENSITIVE Sensitive     * >=100,000 COLONIES/mL ESCHERICHIA COLI     Studies: No results found.  Scheduled Meds:  Scheduled Meds: . antiseptic oral rinse  7 mL Mouth Rinse BID  . aspirin EC  325 mg Oral Daily  . calcitRIOL  0.25 mcg Oral Q M,W,F-HD  . carvedilol  12.5 mg Oral BID WC  . cefTRIAXone (ROCEPHIN)  IV  1 g Intravenous  Q24H  . darbepoetin (ARANESP) injection - DIALYSIS  60 mcg Intravenous Q Wed-HD  . divalproex  750 mg Oral Q12H  . doxazosin  2 mg Oral Daily  . heparin  5,000 Units Subcutaneous 3 times per day  . insulin aspart  0-9 Units Subcutaneous TID WC  . insulin glargine  3 Units Subcutaneous Daily  . pantoprazole  40 mg Oral Daily  . pravastatin  40 mg Oral q1800  . sevelamer carbonate  800 mg Oral TID WC  . sodium chloride  3 mL Intravenous Q12H   Continuous Infusions:    Time spent on care of this patient: 35 min   Yalexa Blust A, MD 07/25/2015, 11:59 AM  LOS: 13 days   [facility] Office  [phone] Pager - Text Page per [email or web address] If 7PM-7AM, please contact night-coverage [email or web address]

## 2015-07-26 ENCOUNTER — Inpatient Hospital Stay (HOSPITAL_COMMUNITY)
Admission: RE | Admit: 2015-07-26 | Discharge: 2015-08-07 | DRG: 056 | Disposition: A | Discharge status: Home / Self Care | Payer: Medicare Other | Source: Intra-hospital | Attending: Physical Medicine & Rehabilitation | Admitting: Physical Medicine & Rehabilitation

## 2015-07-26 ENCOUNTER — Encounter (HOSPITAL_COMMUNITY): Payer: Self-pay | Admitting: Internal Medicine

## 2015-07-26 DIAGNOSIS — Y848 Other medical procedures as the cause of abnormal reaction of the patient, or of later complication, without mention of misadventure at the time of the procedure: Secondary | ICD-10-CM | POA: Diagnosis not present

## 2015-07-26 DIAGNOSIS — Z993 Dependence on wheelchair: Secondary | ICD-10-CM

## 2015-07-26 DIAGNOSIS — J449 Chronic obstructive pulmonary disease, unspecified: Secondary | ICD-10-CM | POA: Diagnosis present

## 2015-07-26 DIAGNOSIS — Z87891 Personal history of nicotine dependence: Secondary | ICD-10-CM

## 2015-07-26 DIAGNOSIS — R4189 Other symptoms and signs involving cognitive functions and awareness: Secondary | ICD-10-CM | POA: Diagnosis present

## 2015-07-26 DIAGNOSIS — Y92230 Patient room in hospital as the place of occurrence of the external cause: Secondary | ICD-10-CM

## 2015-07-26 DIAGNOSIS — E1129 Type 2 diabetes mellitus with other diabetic kidney complication: Secondary | ICD-10-CM | POA: Diagnosis present

## 2015-07-26 DIAGNOSIS — I340 Nonrheumatic mitral (valve) insufficiency: Secondary | ICD-10-CM | POA: Diagnosis present

## 2015-07-26 DIAGNOSIS — A4151 Sepsis due to Escherichia coli [E. coli]: Secondary | ICD-10-CM | POA: Diagnosis present

## 2015-07-26 DIAGNOSIS — I69398 Other sequelae of cerebral infarction: Secondary | ICD-10-CM

## 2015-07-26 DIAGNOSIS — I69354 Hemiplegia and hemiparesis following cerebral infarction affecting left non-dominant side: Secondary | ICD-10-CM | POA: Diagnosis present

## 2015-07-26 DIAGNOSIS — D696 Thrombocytopenia, unspecified: Secondary | ICD-10-CM | POA: Diagnosis not present

## 2015-07-26 DIAGNOSIS — D649 Anemia, unspecified: Secondary | ICD-10-CM | POA: Diagnosis present

## 2015-07-26 DIAGNOSIS — R269 Unspecified abnormalities of gait and mobility: Secondary | ICD-10-CM

## 2015-07-26 DIAGNOSIS — I6349 Cerebral infarction due to embolism of other cerebral artery: Secondary | ICD-10-CM | POA: Diagnosis not present

## 2015-07-26 DIAGNOSIS — I6340 Cerebral infarction due to embolism of unspecified cerebral artery: Secondary | ICD-10-CM | POA: Diagnosis present

## 2015-07-26 DIAGNOSIS — I5032 Chronic diastolic (congestive) heart failure: Secondary | ICD-10-CM | POA: Diagnosis present

## 2015-07-26 DIAGNOSIS — G35 Multiple sclerosis: Secondary | ICD-10-CM | POA: Diagnosis present

## 2015-07-26 DIAGNOSIS — Z9981 Dependence on supplemental oxygen: Secondary | ICD-10-CM

## 2015-07-26 DIAGNOSIS — Z992 Dependence on renal dialysis: Secondary | ICD-10-CM

## 2015-07-26 DIAGNOSIS — R569 Unspecified convulsions: Secondary | ICD-10-CM | POA: Diagnosis present

## 2015-07-26 DIAGNOSIS — J42 Unspecified chronic bronchitis: Secondary | ICD-10-CM | POA: Diagnosis not present

## 2015-07-26 DIAGNOSIS — I639 Cerebral infarction, unspecified: Secondary | ICD-10-CM | POA: Diagnosis present

## 2015-07-26 DIAGNOSIS — G4733 Obstructive sleep apnea (adult) (pediatric): Secondary | ICD-10-CM | POA: Diagnosis present

## 2015-07-26 DIAGNOSIS — N186 End stage renal disease: Secondary | ICD-10-CM | POA: Diagnosis present

## 2015-07-26 DIAGNOSIS — I132 Hypertensive heart and chronic kidney disease with heart failure and with stage 5 chronic kidney disease, or end stage renal disease: Secondary | ICD-10-CM | POA: Diagnosis present

## 2015-07-26 DIAGNOSIS — T827XXA Infection and inflammatory reaction due to other cardiac and vascular devices, implants and grafts, initial encounter: Secondary | ICD-10-CM | POA: Diagnosis not present

## 2015-07-26 DIAGNOSIS — E1122 Type 2 diabetes mellitus with diabetic chronic kidney disease: Secondary | ICD-10-CM | POA: Diagnosis present

## 2015-07-26 DIAGNOSIS — G35D Multiple sclerosis, unspecified: Secondary | ICD-10-CM | POA: Diagnosis present

## 2015-07-26 DIAGNOSIS — I69319 Unspecified symptoms and signs involving cognitive functions following cerebral infarction: Secondary | ICD-10-CM | POA: Diagnosis not present

## 2015-07-26 DIAGNOSIS — G8222 Paraplegia, incomplete: Secondary | ICD-10-CM | POA: Diagnosis not present

## 2015-07-26 LAB — CBC
HCT: 34.5 % — ABNORMAL LOW (ref 36.0–46.0)
Hemoglobin: 10.9 g/dL — ABNORMAL LOW (ref 12.0–15.0)
MCH: 28.2 pg (ref 26.0–34.0)
MCHC: 31.6 g/dL (ref 30.0–36.0)
MCV: 89.1 fL (ref 78.0–100.0)
PLATELETS: 143 10*3/uL — AB (ref 150–400)
RBC: 3.87 MIL/uL (ref 3.87–5.11)
RDW: 15.7 % — AB (ref 11.5–15.5)
WBC: 12.9 10*3/uL — AB (ref 4.0–10.5)

## 2015-07-26 LAB — RENAL FUNCTION PANEL
ALBUMIN: 2.8 g/dL — AB (ref 3.5–5.0)
Anion gap: 14 (ref 5–15)
BUN: 16 mg/dL (ref 6–20)
CO2: 25 mmol/L (ref 22–32)
CREATININE: 3.21 mg/dL — AB (ref 0.44–1.00)
Calcium: 8.2 mg/dL — ABNORMAL LOW (ref 8.9–10.3)
Chloride: 93 mmol/L — ABNORMAL LOW (ref 101–111)
GFR calc Af Amer: 17 mL/min — ABNORMAL LOW (ref >60)
GFR, EST NON AFRICAN AMERICAN: 15 mL/min — AB (ref >60)
GLUCOSE: 144 mg/dL — AB (ref 65–99)
PHOSPHORUS: 3.3 mg/dL (ref 2.5–4.6)
POTASSIUM: 3.3 mmol/L — AB (ref 3.5–5.1)
SODIUM: 132 mmol/L — AB (ref 135–145)

## 2015-07-26 LAB — GLUCOSE, CAPILLARY
GLUCOSE-CAPILLARY: 79 mg/dL (ref 65–99)
GLUCOSE-CAPILLARY: 82 mg/dL (ref 65–99)
GLUCOSE-CAPILLARY: 140 mg/dL — AB (ref 65–99)

## 2015-07-26 LAB — CULTURE, BLOOD (ROUTINE X 2)
CULTURE: NO GROWTH
Culture: NO GROWTH

## 2015-07-26 LAB — VALPROIC ACID LEVEL: Valproic Acid Lvl: 78 ug/mL (ref 50.0–100.0)

## 2015-07-26 LAB — MAGNESIUM: MAGNESIUM: 2 mg/dL (ref 1.7–2.4)

## 2015-07-26 MED ORDER — AMLODIPINE BESYLATE 5 MG PO TABS
5.0000 mg | ORAL_TABLET | Freq: Every day | ORAL | Status: DC
  Administered 2015-07-26: 5 mg via ORAL
  Filled 2015-07-26: qty 1

## 2015-07-26 MED ORDER — PANTOPRAZOLE SODIUM 40 MG PO TBEC
40.0000 mg | DELAYED_RELEASE_TABLET | Freq: Every day | ORAL | Status: DC
  Administered 2015-07-27: 40 mg via ORAL
  Filled 2015-07-26: qty 1

## 2015-07-26 MED ORDER — LEVETIRACETAM 750 MG PO TABS: 750.0000 mg | ORAL_TABLET | Freq: Two times a day (BID) | ORAL | Status: DC

## 2015-07-26 MED ORDER — CIPROFLOXACIN HCL 500 MG PO TABS: 500.0000 mg | ORAL_TABLET | Freq: Two times a day (BID) | ORAL | Status: DC

## 2015-07-26 MED ORDER — DOXAZOSIN MESYLATE 2 MG PO TABS
2.0000 mg | ORAL_TABLET | Freq: Every day | ORAL | Status: DC
  Administered 2015-07-30: 2 mg via ORAL
  Filled 2015-07-26 (×12): qty 1

## 2015-07-26 MED ORDER — DARBEPOETIN ALFA 60 MCG/0.3ML IJ SOSY
60.0000 ug | PREFILLED_SYRINGE | INTRAMUSCULAR | Status: DC
  Filled 2015-07-26: qty 0.3

## 2015-07-26 MED ORDER — SODIUM CHLORIDE 0.9 % IV SOLN: 100.0000 mL | INTRAVENOUS | Status: DC | PRN

## 2015-07-26 MED ORDER — ALTEPLASE 2 MG IJ SOLR
2.0000 mg | Freq: Once | INTRAMUSCULAR | Status: DC | PRN
  Filled 2015-07-26: qty 2

## 2015-07-26 MED ORDER — ONDANSETRON HCL 4 MG PO TABS: 4.0000 mg | ORAL_TABLET | Freq: Four times a day (QID) | ORAL | Status: DC | PRN

## 2015-07-26 MED ORDER — ONDANSETRON HCL 4 MG/2ML IJ SOLN: 4.0000 mg | Freq: Four times a day (QID) | INTRAMUSCULAR | Status: DC | PRN

## 2015-07-26 MED ORDER — HEPARIN SODIUM (PORCINE) 1000 UNIT/ML DIALYSIS: 1000.0000 [IU] | INTRAMUSCULAR | Status: DC | PRN

## 2015-07-26 MED ORDER — METOPROLOL TARTRATE 1 MG/ML IV SOLN
INTRAVENOUS | Status: AC
  Filled 2015-07-26: qty 5

## 2015-07-26 MED ORDER — DIVALPROEX SODIUM 250 MG PO DR TAB
750.0000 mg | DELAYED_RELEASE_TABLET | Freq: Two times a day (BID) | ORAL | Status: DC
  Administered 2015-07-27 – 2015-08-07 (×23): 750 mg via ORAL
  Filled 2015-07-26 (×24): qty 3

## 2015-07-26 MED ORDER — ASPIRIN EC 325 MG PO TBEC
325.0000 mg | DELAYED_RELEASE_TABLET | Freq: Every day | ORAL | Status: DC
  Administered 2015-07-27 – 2015-08-07 (×12): 325 mg via ORAL
  Filled 2015-07-26 (×13): qty 1

## 2015-07-26 MED ORDER — SEVELAMER CARBONATE 800 MG PO TABS
800.0000 mg | ORAL_TABLET | Freq: Three times a day (TID) | ORAL | Status: DC
  Administered 2015-07-27 (×2): 800 mg via ORAL
  Filled 2015-07-26 (×4): qty 1

## 2015-07-26 MED ORDER — LORAZEPAM 2 MG/ML IJ SOLN: 0.5000 mg | INTRAMUSCULAR | Status: DC | PRN

## 2015-07-26 MED ORDER — PRAVASTATIN SODIUM 40 MG PO TABS
40.0000 mg | ORAL_TABLET | Freq: Every day | ORAL | Status: DC
  Administered 2015-07-27 – 2015-08-01 (×5): 40 mg via ORAL
  Filled 2015-07-26 (×10): qty 1

## 2015-07-26 MED ORDER — DEXTROSE 5 % IV SOLN
1.0000 g | INTRAVENOUS | Status: DC
  Administered 2015-07-27 – 2015-08-04 (×9): 1 g via INTRAVENOUS
  Filled 2015-07-26 (×10): qty 10

## 2015-07-26 MED ORDER — CARVEDILOL 12.5 MG PO TABS
12.5000 mg | ORAL_TABLET | Freq: Two times a day (BID) | ORAL | Status: DC
  Administered 2015-07-27 – 2015-08-04 (×6): 12.5 mg via ORAL
  Filled 2015-07-26 (×21): qty 1

## 2015-07-26 MED ORDER — HEPARIN SODIUM (PORCINE) 1000 UNIT/ML DIALYSIS: 20.0000 [IU]/kg | INTRAMUSCULAR | Status: DC | PRN

## 2015-07-26 MED ORDER — LIDOCAINE-PRILOCAINE 2.5-2.5 % EX CREA
1.0000 "application " | TOPICAL_CREAM | CUTANEOUS | Status: DC | PRN
  Filled 2015-07-26: qty 5

## 2015-07-26 MED ORDER — LIDOCAINE HCL (PF) 1 % IJ SOLN: 5.0000 mL | INTRAMUSCULAR | Status: DC | PRN

## 2015-07-26 MED ORDER — CETYLPYRIDINIUM CHLORIDE 0.05 % MT LIQD
7.0000 mL | Freq: Two times a day (BID) | OROMUCOSAL | Status: DC
Start: 2015-07-27 — End: 2015-08-07
  Administered 2015-07-27 – 2015-08-07 (×15): 7 mL via OROMUCOSAL

## 2015-07-26 MED ORDER — CALCITRIOL 0.25 MCG PO CAPS
0.2500 ug | ORAL_CAPSULE | ORAL | Status: DC
  Filled 2015-07-26: qty 1

## 2015-07-26 MED ORDER — PENTAFLUOROPROP-TETRAFLUOROETH EX AERO: 1.0000 "application " | INHALATION_SPRAY | CUTANEOUS | Status: DC | PRN

## 2015-07-26 MED ORDER — INSULIN ASPART 100 UNIT/ML ~~LOC~~ SOLN
0.0000 [IU] | Freq: Three times a day (TID) | SUBCUTANEOUS | Status: DC
  Administered 2015-07-27 – 2015-08-06 (×3): 1 [IU] via SUBCUTANEOUS

## 2015-07-26 MED ORDER — HEPARIN SODIUM (PORCINE) 5000 UNIT/ML IJ SOLN
5000.0000 [IU] | Freq: Three times a day (TID) | INTRAMUSCULAR | Status: DC
  Administered 2015-07-27 – 2015-07-30 (×9): 5000 [IU] via SUBCUTANEOUS
  Filled 2015-07-26 (×7): qty 1

## 2015-07-26 NOTE — Discharge Summary (Addendum)
Physician Discharge Summary  Gail Chapman ZOX:[id number] DOB: June 25, 1956 DOA: 07/12/2015  PCP: No PCP Per Patient  Admit date: 07/12/2015 Discharge date: 07/26/2015  Time spent: 40 minutes  Recommendations for Outpatient Follow-up:  1. Follow-up with primary care physician within one week.  Discharge Diagnoses:  Principal Problem:   Acute encephalopathy Active Problems:   OSA (obstructive sleep apnea)   Hypertensive emergency   Pulmonary hypertension (HCC)   Chronic diastolic CHF (congestive heart failure) (HCC)   COPD (chronic obstructive pulmonary disease) (HCC)   Seizures (HCC)   Elevated troponin   MS (multiple sclerosis) (HCC)   ESRD on dialysis (HCC)   Acute on chronic diastolic heart failure (HCC)   Cerebral embolism with cerebral infarction   Cerebral thrombosis with cerebral infarction   Stroke with cerebral ischemia (HCC)   Gait disturbance, post-stroke   Discharge Condition: Stable  Diet recommendation: Renal/heart healthy diet  Filed Weights   07/23/15 2053 07/24/15 2117 07/25/15 2024  Weight: 65.772 kg (145 lb) 65.59 kg (144 lb 9.6 oz) 68.629 kg (151 lb 4.8 oz)    History of present illness:  Gail Chapman is a 59 y.o. female with PMH of MS, chronic bilateral leg weakness (using wheelchair), hypertension, hyperlipidemia, diabetes mellitus, diastolic congestive heart failure, pulmonary hypertension, OSA on CPAP, COPD on 3 L oxygen at home, and end-stage renal disease-HD (MWF), who presents with altered mental status, dizziness, seizure.  Pt was transferred from [facility] for evaluation after brought to the ED there following a witnessed seizure.   Per patient's husband, patient became confused after using bathroom at 10:00 AM. She also had dizziness and aphasia. Sheand was noticed to have rgidity in both arms and deviated gaze to the right. The episode lasted for about 2 to 3 min. She was brought to [facility] ED where she was found to have  elevated bp at 260/130 and elevated trop at 0.46. She was started with Cardizem drip in the emergency room. Pt did not have chest pain. She has mild cough, no sputum production, no fever or chills. She missed HD today. Patient has no history of seizure disorder or previous seizure activity. When I saw pt on the floor, her mental status has improved to baseline. Her blood pressure is also improved to 170/78.   Hospital Course:   Acute encephalopathy -Suspected to be secondary to accelerated hypertension, CVA and post ictal state -Still appears to be quite slow to respond to questions and intermittently is having difficulty with following commands. -Medications including Depakote, Vimpat and Doxil Zosyn can cause somnolence  Sepsis, secondary to UTI -Had a fever of 101.9 with heart rate of 104 yesterday consistent with sepsis. -Blood and urine obtained, urinalysis consistent with UTI. -Started on Zosyn and daptomycin (is allergic to vancomycin). No fever or chills today. -Urine culture showed Escherichia coli, daptomycin/Zosyn discontinued, Rocephin started, discharged on Cipro for 3 more days.  CVA -B/l frontal and occipital infarcts -Neurology suspects a cardioembolic source vs due to hypotension -Lower extremity duplex negative for DVT -Carotid duplex does not reveal any significant stenosis -Transcranial Doppler consistent with diffuse intracranial atherosclerosis  -Transthoracic echo did not reveal a thrombus, TEE done on 07/23/15 showed no evidence of intracardiac thrombus. -EP cardiology consulted, recommended to defer doing the loop recorder. -The justification for that even if the patient has atrial fibrillation, patient has history of intracranial hemorrhage will contraindicate anticoagulation.  Vomiting - Vomited previously, which is likely secondary to diabetic gastroparesis. -This is happens at home  per husband, resolved, treat symptomatically with antiemetics when  necessary  Seizure -Continue Depakote perneurology, Vimpat added initially, patient's husband reporting that she is sleepy after that so discontinued. -EEG reveals an epileptogenic potential.  End-stage renal disease on hemodialysis -Dialysis Monday Wednesday Friday-nephrology following  Accelerated hypertension -Long h/o uncontrolled BP, 2-D echo showed severely concentric hypertrophied left ventricle. -improving with adjustment of antihypertensives  -cont Cardura and carvedilol- IV Lopressor ordered to use as needed - hydralazine, labetalol and clonidine mentioned as allergies  Insulin-dependent diabetes mellitus -Continue Levemir and sliding scale insulin  Mild troponin elevation/ focal akinesis on ECHO  - see ECHO report below -Evaluated by cardiology -Troponin elevated suspected to be demand ischemia -Further work up for focal hypokinesis, per cardiology, to be done at a later date pending completion of neuro w/u   COPD -Chronically on 3 L of oxygen at home  Moderate Pericardial effusion - chronic- no tamponade  Generalized weakness PT recommended skilled nursing facility-  NSVT Patient has only 5 beats of nonsustained V. tach the night before she was discharged. This is likely because she did not take her there blockers. Patient has known cardiomegaly secondary to chronic uncontrolled HTN.  Procedures:  None  Consultations:  PCCM  Nephrology.  Neurology  Discharge Exam: Filed Vitals:   07/26/15 0436  BP: 150/62  Pulse: 65  Temp: 97.8 F (36.6 C)  Resp: 18   General: Alert and awake, oriented x3, not in any acute distress. HEENT: anicteric sclera, pupils reactive to light and accommodation, EOMI CVS: S1-S2 clear, no murmur rubs or gallops Chest: clear to auscultation bilaterally, no wheezing, rales or rhonchi Abdomen: soft nontender, nondistended, normal bowel sounds, no organomegaly Extremities: no cyanosis, clubbing or edema noted  bilaterally Neuro: Cranial nerves II-XII intact, no focal neurological deficits  Discharge Instructions   Discharge Instructions    Diet - low sodium heart healthy    Complete by:  As directed      Increase activity slowly    Complete by:  As directed           Current Discharge Medication List    START taking these medications   Details  ciprofloxacin (CIPRO) 500 MG tablet Take 1 tablet (500 mg total) by mouth 2 (two) times daily. Qty: 3 tablet, Refills: 0    levETIRAcetam (KEPPRA) 750 MG tablet Take 1 tablet (750 mg total) by mouth 2 (two) times daily.      CONTINUE these medications which have NOT CHANGED   Details  aspirin EC 325 MG EC tablet Take 1 tablet (325 mg total) by mouth daily. Qty: 30 tablet, Refills: 3    carvedilol (COREG) 12.5 MG tablet Take 1 tablet (12.5 mg total) by mouth 2 (two) times daily with a meal. On takes on non dialysis day. Qty: 60 tablet, Refills: 3    doxazosin (CARDURA) 2 MG tablet Take 1 tablet (2 mg total) by mouth daily. Qty: 30 tablet, Refills: 6   Associated Diagnoses: Chronic diastolic CHF (congestive heart failure) (HCC)    insulin aspart (NOVOLOG) 100 UNIT/ML injection Inject 10 Units into the skin 3 (three) times daily with meals. Per sliding scale Qty: 10 mL, Refills: 0    insulin detemir (LEVEMIR) 100 UNIT/ML injection Inject 0.04 mLs (4 Units total) into the skin at bedtime. Hold if CBG<150 Qty: 10 mL, Refills: 0    glucose blood (FREESTYLE LITE) test strip Use as instructed Qty: 100 each, Refills: 12    promethazine (PHENERGAN) 25 MG suppository Place  1 suppository (25 mg total) rectally at bedtime. Qty: 12 each, Refills: 3      STOP taking these medications     pravastatin (PRAVACHOL) 40 MG tablet      sevelamer carbonate (RENVELA) 800 MG tablet        Allergies  Allergen Reactions  . Clonidine Derivatives     Hand itching  . Hydralazine     Visual disturbances   . Labetalol     Fatigue   . Vancomycin      "shut down my kidneys"   Follow-up Information    Follow up with Xu,Jindong, MD. Schedule an appointment as soon as possible for a visit in 2 months.   Specialty:  Neurology   Why:  stroke clinic   Contact information:   [address] [place] [address] [phone]        The results of significant diagnostics from this hospitalization (including imaging, microbiology, ancillary and laboratory) are listed below for reference.    Significant Diagnostic Studies: Ct Head Wo Contrast  07/14/2015  CLINICAL DATA:  Acute onset confusion and lethargy after dialysis. Chronic renal failure. EXAM: CT HEAD WITHOUT CONTRAST TECHNIQUE: Contiguous axial images were obtained from the base of the skull through the vertex without intravenous contrast. COMPARISON:  None. FINDINGS: Moderate diffuse atrophy is present. There is no intracranial mass, hemorrhage, extra-axial fluid collection, midline shift. There is small vessel disease throughout the centra semiovale bilaterally. There are age uncertain fairly small infarcts in the anterior left occipital and anterior superior left frontal lobes. There is a small age uncertain infarct in the superior posterior right cerebellum. There is also a small age uncertain infarct in the posterior right temporal lobe lateral and slightly posterior to the temporal horn the right lateral ventricle. There is a small age uncertain infarct in the right lentiform nucleus. Bony calvarium appears intact. The mastoid air cells are clear. There is mild mucosal thickening of several ethmoid air cells bilaterally. IMPRESSION: Atrophy with extensive periventricular small vessel disease. Age uncertain small infarcts at several sites as described. No hemorrhage or mass effect. Mild ethmoid sinus disease bilaterally. Electronically Signed   By: Bretta Bang III M.D.   On: 07/14/2015 14:25   Mr Brain Wo Contrast  07/15/2015  CLINICAL DATA:  Stroke with cerebral  ischemia EXAM: MRI HEAD WITHOUT CONTRAST TECHNIQUE: Multiplanar, multiecho pulse sequences of the brain and surrounding structures were obtained without intravenous contrast. COMPARISON:  CT head 07/14/2015 FINDINGS: Multiple areas of restricted diffusion bilaterally compatible with acute infarction. Acute infarct in the occipital lobes bilaterally left greater than right. The left-sided stroke measures up to 11 mm. Small area of acute infarct in the right frontal operculum and in the left frontal white matter. Moderate to advanced chronic microvascular ischemic changes in the white matter bilaterally. Chronic infarcts in the thalamus bilaterally. Chronic blood products in the left temporal lobe and in the pontomedullary junction compatible with prior hemorrhage. This is likely related to poorly controlled hypertension. Moderate atrophy. Ventricular enlargement consistent with atrophy. No hydrocephalus. Negative for mass lesion. Normal orbit.  Paranasal sinuses clear. Image quality degraded by motion IMPRESSION: Atrophy and moderate to advanced chronic ischemia. Chronic hemorrhage in the brainstem and left temporal lobe due to chronic hypertension Acute infarcts in the occipital lobe and frontal lobes bilaterally. Electronically Signed   By: Marlan Palau M.D.   On: 07/15/2015 14:45   Dg Chest Port 1 View  07/15/2015  CLINICAL DATA:  E86.9 (ICD-10-CM) -  Volume depletion EXAM: PORTABLE CHEST 1 VIEW COMPARISON:  08/28/2014 FINDINGS: Left-sided dialysis catheter tip to the upper right atrium. Cardiopericardial silhouette is enlarged and stable. Lungs are clear. No pulmonary edema. IMPRESSION: No significant change in marked enlargement of the cardiopericardial silhouette. Electronically Signed   By: Norva Pavlov M.D.   On: 07/15/2015 10:20    Microbiology: Recent Results (from the past 240 hour(s))  Culture, blood (routine x 2)     Status: None (Preliminary result)   Collection Time: 07/21/15  6:35 PM   Result Value Ref Range Status   Specimen Description BLOOD RIGHT ANTECUBITAL  Final   Special Requests BOTTLES DRAWN AEROBIC AND ANAEROBIC R 3CC B 5CC  Final   Culture NO GROWTH 4 DAYS  Final   Report Status PENDING  Incomplete  Culture, blood (routine x 2)     Status: None (Preliminary result)   Collection Time: 07/21/15  6:40 PM  Result Value Ref Range Status   Specimen Description BLOOD RIGHT HAND  Final   Special Requests IN PEDIATRIC BOTTLE 2CC  Final   Culture NO GROWTH 4 DAYS  Final   Report Status PENDING  Incomplete  Urine culture     Status: None   Collection Time: 07/21/15 11:52 PM  Result Value Ref Range Status   Specimen Description URINE, CATHETERIZED  Final   Special Requests NONE  Final   Culture >=100,000 COLONIES/mL ESCHERICHIA COLI  Final   Report Status 07/24/2015 FINAL  Final   Organism ID, Bacteria ESCHERICHIA COLI  Final      Susceptibility   Escherichia coli - MIC*    AMPICILLIN >=32 RESISTANT Resistant     CEFAZOLIN <=4 SENSITIVE Sensitive     CEFTRIAXONE <=1 SENSITIVE Sensitive     CIPROFLOXACIN <=0.25 SENSITIVE Sensitive     GENTAMICIN <=1 SENSITIVE Sensitive     IMIPENEM <=0.25 SENSITIVE Sensitive     NITROFURANTOIN <=16 SENSITIVE Sensitive     TRIMETH/SULFA <=20 SENSITIVE Sensitive     AMPICILLIN/SULBACTAM 16 INTERMEDIATE Intermediate     PIP/TAZO <=4 SENSITIVE Sensitive     * >=100,000 COLONIES/mL ESCHERICHIA COLI     Labs: Basic Metabolic Panel:  Recent Labs Lab 07/20/15 0615 07/21/15 0452 07/22/15 0620 07/23/15 0800 07/23/15 1600 07/26/15 0122  NA 135 134* 138 133* 135  --   K 4.2 4.7 4.7 4.6 3.3*  --   CL 97* 94* 98* 95* 96*  --   CO2 --   GLUCOSE 85 84 67 83 105*  --   BUN 24* 36* 48* 56* 12  --   CREATININE 3.89* 5.14* 6.52* 7.77* 2.98*  --   CALCIUM 8.4* 9.1 9.1 8.1* 8.7*  --   MG  --   --   --   --   --  2.0  PHOS  --   --   --  6.3* 2.6  --    Liver Function Tests:  Recent Labs Lab 07/23/15 0800  07/23/15 1600  ALBUMIN 2.5* 2.8*   No results for input(s): LIPASE, AMYLASE in the last 168 hours.  Recent Labs Lab 07/20/15 0710  AMMONIA 33   CBC:  Recent Labs Lab 07/20/15 0615 07/21/15 0452 07/22/15 0620 07/23/15 0759 07/23/15 1600  WBC 5.3 7.8 9.6 9.5 8.6  NEUTROABS  --   --   --  7.4  --   HGB 9.6* 10.2* 9.4* 8.8* 10.1*  HCT 31.6* 31.7* 28.5* 28.1* 32.7*  MCV 89.8 88.8 86.9 88.9  89.1  PLT 173 177 161 178 182   Cardiac Enzymes: No results for input(s): CKTOTAL, CKMB, CKMBINDEX, TROPONINI in the last 168 hours. BNP: BNP (last 3 results)  Recent Labs  07/13/15 0344  BNP >4500.0*    ProBNP (last 3 results) No results for input(s): PROBNP in the last 8760 hours.  CBG:  Recent Labs Lab 07/25/15 1201 07/25/15 1605 07/25/15 2022 07/26/15 0800 07/26/15 1129  GLUCAP 94 95 98 79 82       Signed:  Neita Landrigan A  [facility] 07/26/2015, 12:58 PM

## 2015-07-26 NOTE — Progress Notes (Signed)
Patient had 5 beat run of VTACH.  VS were stable and she was asymptomatic.  Husband at bedside.  He was educated on [facility].  He continues to refuse for patient's coreg to be given to her.  [facility] made aware.  Will continue to monitor patient.  Bernie Covey RN-BC, [employer]

## 2015-07-26 NOTE — Progress Notes (Signed)
Physical Therapy Treatment Patient Details Name: Gail Chapman MRN: [id number] DOB: [birth year] Today's Date: 07/26/2015    History of Present Illness Pt is a 59 y/o female with a PMH of DM, uncontrolled HTN, CHF, COPD, ESRD (MWF), who initially presented 10/3 with confusion and probable seizure. On 10/5 after HD she had episode of AMS with acute HTN with SBP 200. MRI on 10/6 revealed acute infarcts in occipital love and frontal lobes bilaterally as well as chronic hemorrhage in brainstem and L temporal lobe due to chronic HTN.     PT Comments    Pt improved both cognitively and functionally compared to last week. Pt able to actively participate and stay awake during treatment. Tolerate ambulation this date with maxAx2. Recommend CIR upon d/c to maximize functional recovery.  Follow Up Recommendations  CIR;Supervision/Assistance - 24 hour     Equipment Recommendations  None recommended by PT    Recommendations for Other Services       Precautions / Restrictions Precautions Precautions: Fall Restrictions Weight Bearing Restrictions: No    Mobility  Bed Mobility               General bed mobility comments: pt up on BSC upon PT arrival  Transfers Overall transfer level: Needs assistance Equipment used: 2 person hand held assist Transfers: Sit to/from [employer] Transfers Sit to Stand: Max assist;+2 safety/equipment Stand pivot transfers: Max assist;+2 safety/equipment       General transfer comment: pt able to push up with UEs, maxA at posterior hips with max directional v/c's to maintain upright position. pt fatigued quickly. pt stood x 2 min with maxA for hygiene  Ambulation/Gait Ambulation/Gait assistance: Max assist;+2 physical assistance;+2 safety/equipment (3rd person for chair follow) Ambulation Distance (Feet): 15 Feet Assistive device: Rolling walker (2 wheeled) Gait Pattern/deviations: Decreased step length - right Gait velocity: slow   General  Gait Details: pt required assist to advance L LE and maintain grip on walker. assist for walker management and to maitnain upright posture. v/c's for sequencing steps   Stairs            Wheelchair Mobility    Modified Rankin (Stroke Patients Only) Modified Rankin (Stroke Patients Only) Pre-Morbid Rankin Score: Slight disability Modified Rankin: Moderately severe disability     Balance Overall balance assessment: Needs assistance Sitting-balance support: Feet supported Sitting balance-Leahy Scale: Poor Sitting balance - Comments: pt requires physical assist as pt with anterior lean and no self correction   Standing balance support: Bilateral upper extremity supported Standing balance-Leahy Scale: Poor                      Cognition Arousal/Alertness: Awake/alert Behavior During Therapy: Flat affect Overall Cognitive Status: Impaired/Different from baseline Area of Impairment: Following commands;Awareness;Problem solving;Attention   Current Attention Level: Selective   Following Commands: Follows one step commands consistently (improved from last week) Safety/Judgement: Decreased awareness of safety;Decreased awareness of deficits Awareness: Intellectual Problem Solving: Slow processing;Requires verbal cues;Requires tactile cues General Comments: pt able to keep eyes opened this date and was able to actively participate    Exercises      General Comments        Pertinent Vitals/Pain Pain Assessment: No/denies pain    Home Living                      Prior Function            PT Goals (current goals can now be  found in the care plan section) Progress towards PT goals: Progressing toward goals    Frequency  Min 3X/week    PT Plan Current plan remains appropriate    Co-evaluation             End of Session Equipment Utilized During Treatment: Gait belt Activity Tolerance: Patient limited by fatigue Patient left: in chair;with  call bell/phone within reach;with chair alarm set     Time: 8638-1771 PT Time Calculation (min) (ACUTE ONLY): 24 min  Charges:  $Gait Training: 8-22 mins $Therapeutic Activity: 8-22 mins                    G Codes:      Marcene Brawn 07/26/2015, 12:14 PM   Lewis Shock, PT, DPT Pager #: [phone] Office #: [phone]

## 2015-07-26 NOTE — Progress Notes (Signed)
Speech Language Pathology Treatment: Cognitive-Linquistic  Patient Details Name: Gail Chapman MRN: [id number] DOB: 1956-03-21 Today's Date: 07/26/2015 Time: 7793-9030 SLP Time Calculation (min) (ACUTE ONLY): 25 min  Assessment / Plan / Recommendation Clinical Impression  During cognitive treatment focusing on initiation, attention, and memory, pt completed reading, recall, and requesting tasks with moderate verbal and visual cues to assist with completing the task. SLP limited visual field by folding the menu and using a paper to cover additional information, SLP also instructed the pt to use her finger to mark her place as she read to assist with visual scanning. Pt had difficulty dialing a phone number, but did successfully order her food over the phone. Pt showed progress during this session, but continues to require moderate assist to complete basic tasks. SLP educated the pt and pt's husband on the importance of working through tasks and recommends continued SLP treatment.    HPI Other Pertinent Information: 59 y.o. female with PMH of MS, chronic bilateral leg weakness (using wheelchair), hypertension, hyperlipidemia, diabetes mellitus, diastolic congestive heart failure, pulmonary hypertension, OSA on CPAP, COPD on 3 L oxygen at home, and end-stage renal disease-HD (MWF), who presents with altered mental status, dizziness, seizure.MRI on 10/6 revealed acute infarcts in occipital love and frontal lobes bilaterally as well as chronic hemorrhage in brainstem and L temporal lobe due to chronic HTN.    Pertinent Vitals Pain Assessment: No/denies pain  SLP Plan  Continue with current plan of care    Recommendations                Follow up Recommendations: Inpatient Rehab;24 hour supervision/assistance;Skilled Nursing facility Plan: Continue with current plan of care    GO    Riccardo Dubin, Student-SLP  Riccardo Dubin 07/26/2015, 12:18 PM

## 2015-07-26 NOTE — Progress Notes (Signed)
Pt in dialysis at this time.  May be moved.  Rt will monitor.

## 2015-07-26 NOTE — Progress Notes (Signed)
Inpatient Rehabilitation  I continue to await [employer] [place] decision on possible IP Rehab.  I have updated Dr. Arthor Captain, the patient and her husband, as well as Jeannie Crutchfield, CM.  I have explained to pt. and family that pt. Could/may be DC'd home prior to [place] receiving insurance decision. Husband again states he does not want pt. To go to SNF and would prefer home Dc if we do not hear back from [facility]. Please call if questions.  [name] PT Inpatient Rehab Admissions Coordinator Cell [phone] Office [phone]

## 2015-07-26 NOTE — H&P (Signed)
Physical Medicine and Rehabilitation Admission H&P    CC: Weakness and cognitive deficits after seizures and embolic strokes   HPI: Gail Chapman is a 59 y.o. RH-female with history of HTN, DM, diastolic CHF, COPD-oxygen dependent, MS with BLE weakness-wheelchair bound, ESRD-HD MWF who was admitted on 07/12/15 with generalized seizure and transient confusion. CT head negative and EEG done revealing focal sharp wave Left frontal region suggestive of focal disturbance with epileptogenic potential. She was placed on Depakote for seizures per neurology input. 2D echo with EF 50-55% with akinesis of basal-midinferior myocardium, grade 3 diastolic dysfunction moderate MVR and small to moderate pericardial effusion--no tamponade. Cardiology consulted for input and felt that elevated cardiac enzymes likely due to CHF. Blood pressures have been labile and treated with cardene drip. She had decrease in mental status with confusion after dialysis on 10/05 and MRI/MRA brain done revealing acute infarcts in bilateral occipital and frontal lobes and chronic hemorrhage in brain stem and left temporal lobes due to HTN. Dr. Roda Shutters recommended ASA for embolic stroke question due to hypotension v/s AFib. She has had persistent lethargy and repeat EEG showed improvement in frontal sharps but continued parieto-oocipital sharp waves therefore Vimpat added for seizure control. She was noted to be febrile with lethargy on 10/12 due to urosepsis and antibiotics narrowed to Rocephin.  She is not a candidate for anticoagulation therefore  ILR not indicated per cardiology. Patient has had hypotensive episodes correlating to infarct and to have 30 day cardiac event monitoring as outpatient.    Husband has been refusing Vimpat therefore this was discontinued by Neuro. Depakote levels therapeutic on 750 mg bid.  Mentation and activity tolerance is improving and therapy has been ongoing. Patient with resultant weakness as well as  cognitive deficits affecting mobility. Family and PT requesting CIR for follow up therapy. Pt used a walker at baseline for ambulation.    Review of Systems  HENT: Negative for hearing loss.   Eyes: Positive for blurred vision. Negative for pain.  Respiratory: Negative for cough and shortness of breath.   Cardiovascular: Negative for chest pain and palpitations.  Gastrointestinal: Negative for heartburn, nausea, abdominal pain and constipation.  Genitourinary: Negative for dysuria and urgency.       Produces urine bid  Musculoskeletal: Negative for myalgias and neck pain.  Skin: Negative for itching and rash.  Neurological: Positive for focal weakness (B/l LE and LUE) and weakness. Negative for headaches.  Psychiatric/Behavioral: The patient does not have insomnia.   All other systems reviewed and are negative.     Past Medical History  Diagnosis Date  . Diabetes mellitus without complication (HCC)   . Chronic diastolic CHF (congestive heart failure) (HCC)     a. 01/2007 MV: No isch/infarct, nl EF;  b. 2012 Cath: "normal" per patient.  Performed in [place], [place] by Dr. Graciela Husbands.  . Pulmonary hypertension (HCC)     a. on home O2 @ 3lpm 24hrs/day  . HTN (hypertension)     a. Dx in 1993.  Marland Kitchen LVH (left ventricular hypertrophy)   . OSA (obstructive sleep apnea)     a. uses CPAP  . Physical deconditioning   . Renal disorder   . COPD (chronic obstructive pulmonary disease) [facility])     Past Surgical History  Procedure Laterality Date  . Knee surgery    . Carpal tunnel release    . Tubal ligation    . Cholecystectomy    . Mass excision  a. under axilla.  . Subxyphoid pericardial window N/A 02/26/2013    Procedure: SUBXYPHOID PERICARDIAL WINDOW;  Surgeon: Delight Ovens, MD;  Location: [facility] OR;  Service: Thoracic;  Laterality: N/A;  . Left and right heart catheterization with coronary angiogram N/A 02/25/2013    Procedure: LEFT AND RIGHT HEART CATHETERIZATION WITH CORONARY  ANGIOGRAM;  Surgeon: Dolores Patty, MD;  Location: [facility] CATH LAB;  Service: Cardiovascular;  Laterality: N/A;  . Tee without cardioversion N/A 07/23/2015    Procedure: TRANSESOPHAGEAL ECHOCARDIOGRAM (TEE);  Surgeon: Pricilla Riffle, MD;  Location: [facility] ENDOSCOPY;  Service: Cardiovascular;  Laterality: N/A;  PT NEED A LOOP    Family History  Problem Relation Age of Onset  . Diabetes Mother   . CAD Mother   . Cancer Mother   . Hypertension Mother   . Cancer Father   . Diabetes Father   . Hyperlipidemia Father   . Hypertension Father   . Heart attack Father     Social History:  Married. 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 Husband at home at this time and her sister has move in to assist. She was to ambulate with walker and required assistance at times. Needed assistance with transfers and ith ADLs. She eports that she has quit smoking 35 years ago--smoked for 2 years. She does not have any smokeless tobacco history on file. She reports that she does not drink alcohol or use illicit drugs.    Allergies  Allergen Reactions  . Clonidine Derivatives     Hand itching  . Hydralazine     Visual disturbances   . Labetalol     Fatigue   . Vancomycin     "shut down my kidneys"    Medications Prior to Admission  Medication Sig Dispense Refill  . aspirin EC 325 MG EC tablet Take 1 tablet (325 mg total) by mouth daily. 30 tablet 3  . carvedilol (COREG) 12.5 MG tablet Take 1 tablet (12.5 mg total) by mouth 2 (two) times daily with a meal. On takes on non dialysis day. 60 tablet 3  . doxazosin (CARDURA) 2 MG tablet Take 1 tablet (2 mg total) by mouth daily. 30 tablet 6  . insulin aspart (NOVOLOG) 100 UNIT/ML injection Inject 10 Units into the skin 3 (three) times daily with meals. Per sliding scale 10 mL 0  . insulin detemir (LEVEMIR) 100 UNIT/ML injection Inject 0.04 mLs (4 Units total) into the skin at bedtime. Hold if CBG<150 10 mL 0  . glucose blood (FREESTYLE LITE)  test strip Use as instructed (Patient not taking: Reported on 05/05/2015) 100 each 12  . promethazine (PHENERGAN) 25 MG suppository Place 1 suppository (25 mg total) rectally at bedtime. (Patient not taking: Reported on 05/05/2015) 12 each 3    Home: Home Living Family/patient expects to be discharged to:: Skilled nursing facility Living Arrangements: Spouse/significant other Available Help at Discharge: Family  Lives With: Spouse   Functional History: Prior Function Level of Independence: Needs assistance Gait / Transfers Assistance Needed: pt walked with walker PTA and used w/c at times due to MS ADL's / Homemaking Assistance Needed: husband cooked and cleaned, bathed pt, dressed pt, helped pt toilet.  Pt only got sponge bathes. Comments: Pt not following many commands. Husband answering most questions.  Functional Status:  Mobility: Bed Mobility Overal bed mobility: Needs Assistance Bed Mobility: Supine to Sit Rolling: Max assist Sidelying to sit: Max assist Supine to sit: Mod assist, +2  for physical assistance Sit to supine: Max assist General bed mobility comments: pt up on BSC upon PT arrival Transfers Overall transfer level: Needs assistance Equipment used: 2 person hand held assist Transfers: Sit to/from Stand, Stand Pivot Transfers Sit to Stand: Max assist, +2 safety/equipment Stand pivot transfers: Max assist, +2 safety/equipment Squat pivot transfers: Total assist, +2 physical assistance General transfer comment: pt able to push up with UEs, maxA at posterior hips with max directional v/c's to maintain upright position. pt fatigued quickly. pt stood x 2 min with maxA for hygiene Ambulation/Gait Ambulation/Gait assistance: Max assist, +2 physical assistance, +2 safety/equipment (3rd person for chair follow) Ambulation Distance (Feet): 15 Feet Assistive device: Rolling walker (2 wheeled) Gait Pattern/deviations: Decreased step length - right General Gait Details: pt  required assist to advance L LE and maintain grip on walker. assist for walker management and to maitnain upright posture. v/c's for sequencing steps Gait velocity: slow    ADL: ADL Overall ADL's : Needs assistance/impaired Eating/Feeding: Total assistance, Sitting Eating/Feeding Details (indicate cue type and reason): worked on bringing cup to mouth, husband reports feeding her this morning Grooming: Wash/dry hands, Wash/dry face, Moderate assistance, Bed level Grooming Details (indicate cue type and reason): unable to balance at EOB for grooming, performed at bed level Upper Body Bathing: Total assistance Lower Body Bathing: Total assistance Upper Body Dressing : Total assistance Lower Body Dressing: Total assistance Toilet Transfer: +2 for physical assistance, Total assistance, Stand-pivot, BSC Toilet Transfer Details (indicate cue type and reason): pt not initiating moving own feet during transfers. Toileting- Clothing Manipulation and Hygiene: +2 for physical assistance, Total assistance, Sit to/from stand Functional mobility during ADLs: Total assistance, +2 for physical assistance General ADL Comments: Worked on sitting balance and trunk strength at EOB, pt with flexed trunk, but responded favorable to faciltation for extension of spine and shoulder retraction.  Dependent on one hand on bed or rail for balance while reaching with the opposite in moderate wide ranges.  Cognition: Cognition Overall Cognitive Status: Impaired/Different from baseline Arousal/Alertness: Lethargic Orientation Level: Oriented X4 Attention: Focused Focused Attention: Impaired Focused Attention Impairment: Verbal basic, Functional basic Memory: Impaired Memory Impairment: Storage deficit, Retrieval deficit Problem Solving: Impaired Problem Solving Impairment: Verbal basic, Functional basic Executive Function:  (all impaired due to lower level deficits) Behaviors: Perseveration Safety/Judgment:  Impaired Cognition Arousal/Alertness: Awake/alert Behavior During Therapy: Flat affect Overall Cognitive Status: Impaired/Different from baseline Area of Impairment: Following commands, Awareness, Problem solving, Attention Orientation Level: Disoriented to, Time Current Attention Level: Selective Memory: Decreased short-term memory Following Commands: Follows one step commands consistently (improved from last week) Safety/Judgement: Decreased awareness of safety, Decreased awareness of deficits Awareness: Intellectual Problem Solving: Slow processing, Requires verbal cues, Requires tactile cues General Comments: pt able to keep eyes opened this date and was able to actively participate   Blood pressure 161/91, pulse 80, temperature 98.1 F (36.7 C), temperature source Oral, resp. rate 20, height 5' 1.5" (1.562 m), weight 68.2 kg (150 lb 5.7 oz), SpO2 98 %. Physical Exam  Nursing note and vitals reviewed. Constitutional: She appears well-developed and well-nourished. No distress.  HENT:  Head: Normocephalic and atraumatic.  Mouth/Throat: Oropharynx is clear and moist.  Eyes: EOM are normal. Pupils are equal, round, and reactive to light. Right conjunctiva is injected. Left conjunctiva is injected.  Neck: Normal range of motion. Neck supple.  Cardiovascular: Normal rate and regular rhythm.   Respiratory: Effort normal and breath sounds normal. No respiratory distress. She has no wheezes.  GI:  Soft. Bowel sounds are normal. She exhibits no distension. There is no tenderness.  Musculoskeletal: She exhibits no edema or tenderness.  Strength LUE: 4-/5 grossly RUE: 4/5 grossly LLE: Hip flexor 3-/5, ankle dorsi/plantarflexion grossly RLE 4-/5 grossly   Neurological: She is alert.  Patient disoriented. She was unable to state the names of her doctors or any of her medications. Lacks insight and awareness of deficits.  No signs of aphasia noted.  Able to follow simple motor commands without  difficulty.   Skin: Skin is warm and dry. No rash noted. She is not diaphoretic. No erythema.  Psychiatric: Her affect is blunt. Her speech is delayed. She is slowed. Cognition and memory are impaired.    Results for orders placed or performed during the hospital encounter of 07/12/15 (from the past 48 hour(s))  Glucose, capillary     Status: Abnormal   Collection Time: 07/24/15  9:15 PM  Result Value Ref Range   Glucose-Capillary 110 (H) 65 - 99 mg/dL  Valproic acid level     Status: None   Collection Time: 07/25/15  6:13 AM  Result Value Ref Range   Valproic Acid Lvl 94 50.0 - 100.0 ug/mL  Glucose, capillary     Status: None   Collection Time: 07/25/15  8:18 AM  Result Value Ref Range   Glucose-Capillary 93 65 - 99 mg/dL  Glucose, capillary     Status: None   Collection Time: 07/25/15 12:01 PM  Result Value Ref Range   Glucose-Capillary 94 65 - 99 mg/dL  Glucose, capillary     Status: None   Collection Time: 07/25/15  4:05 PM  Result Value Ref Range   Glucose-Capillary 95 65 - 99 mg/dL  Glucose, capillary     Status: None   Collection Time: 07/25/15  8:22 PM  Result Value Ref Range   Glucose-Capillary 98 65 - 99 mg/dL  Magnesium     Status: None   Collection Time: 07/26/15  1:22 AM  Result Value Ref Range   Magnesium 2.0 1.7 - 2.4 mg/dL  Valproic acid level     Status: None   Collection Time: 07/26/15  4:49 AM  Result Value Ref Range   Valproic Acid Lvl 78 50.0 - 100.0 ug/mL  Glucose, capillary     Status: None   Collection Time: 07/26/15  8:00 AM  Result Value Ref Range   Glucose-Capillary 79 65 - 99 mg/dL  Glucose, capillary     Status: None   Collection Time: 07/26/15 11:29 AM  Result Value Ref Range   Glucose-Capillary 82 65 - 99 mg/dL   Comment 1 Notify RN    Comment 2 Document in Chart    No results found.     Medical Problem List and Plan: 1. 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 H/o MS.   2.  DVT  Prophylaxis/Anticoagulation: Pharmaceutical: Heparin 3. Pain Management: Tylenol prn  4. Mood: LCSW to follow for evaluation and support.  5. Neuropsych: This patient is not capable of making decisions on her own behalf. 6. Skin/Wound Care: Routine pressure relief measures 7. Fluids/Electrolytes/Nutrition: Monitor I/O. 1200 cc FR.  Labs with HD.  8. 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   9. HTN: Need to prevent hypotension to prevent recurrent watershed infarcts.   10. ESRD: Has history of orhtostatic changes.   11. OSA: Continue CPAP at bedtime/sleeping.  12. E coli Sepsis: continue Rocephin.  13. Arrthymias:  Husband refusing  Coreg--he hold her BP meds at home if SBP < 180. He has refused multiple doses of coreg. Relayed reported episodes of  asymptomatic VT and need to continue coreg. Will set hold parameter on Cardura.  14. DM type 2: Husband does not want BS covered if under 120.  Will monitor BS ac/hs. BS well controlled off lantus. Will continue SSI for now.  15. COPD: Uses oxygen prn with activity.   Post Admission Physician Evaluation: 1. Functional deficits secondary  to weakness and cognitive deficits after seizures and embolic strokes. 2. Patient is admitted to receive collaborative, interdisciplinary care between the physiatrist, rehab nursing staff, and therapy team. 3. Patient's level of medical complexity and substantial therapy needs in context of that medical necessity cannot be provided at a lesser intensity of care such as a SNF. 4. Patient has experienced substantial functional loss from his/her baseline which was documented above under the "Functional History" and "Functional Status" headings.  Judging by the patient's diagnosis, physical exam, and functional history, the patient has potential for functional progress which will result in measurable gains while on inpatient rehab.  These gains will be of substantial and practical use upon  discharge  in facilitating mobility and self-care at the household level. 5. Physiatrist will provide 24 hour management of medical needs as well as oversight of the therapy plan/treatment and provide guidance as appropriate regarding the interaction of the two. 6. 24 hour rehab nursing will assist with bladder management, bowel management, safety, skin/wound care, disease management, medication administration, pain management and patient education and help integrate therapy concepts, techniques,education, etc. 7. PT will assess and treat for/with: Lower extremity strength, range of motion, stamina, balance, functional mobility, safety, adaptive techniques and equipment, woundcare, coping skills, pain control, stroke education.   Goals are: supervision/Min A. 8. OT will assess and treat for/with: ADL's, functional mobility, safety, upper extremity strength, adaptive techniques and equipment, wound mgt, ego support, stroke education, and community reintegration.   Goals are: supervision/Min A. Therapy may proceed with showering this patient. 9. SLP will assess and treat for/with: cognition and processing.  Goals are: supervision/Min A. 10. Case Management and Social Worker will assess and treat for psychological issues and discharge planning. 11. Team conference will be held weekly to assess progress toward goals and to determine barriers to discharge. 12. Patient will receive at least 3 hours of therapy per day at least 5 days per week. 13. ELOS: 14-20 days.       14. Prognosis:  good and fair  Maryla Morrow, MD  07/26/2015

## 2015-07-26 NOTE — Progress Notes (Signed)
Report called to CIR, daily meds given  Per husbands allowance. Received  Coreg and one dose depakote.   With post hemo meal.See flow sheet for vitals.

## 2015-07-26 NOTE — Progress Notes (Signed)
I just received insurance authorization to admit pt. To IP Rehab today.  I have updated Dr. Arthor Captain, the patient, her husband, [name], CM, Genelle Bal, SW, pt's RN Lurena Joiner .  I will make all necessary arrangements.  Please call if questions.  [name] PT Inpatient Rehab Admissions Coordinator Cell [phone] Office [phone]

## 2015-07-26 NOTE — PMR Pre-admission (Signed)
PMR Admission Coordinator Pre-Admission Assessment  Patient: Gail Chapman is an 59 y.o., female MRN: [id number] DOB: 1955/11/21 Height: 5' 1.5" (156.2 cm) Weight: 68.2 kg (150 lb 5.7 oz)              Insurance Information HMO:     PPO:  yes     PCP:      IPA:      80/20:      OTHER:  PRIMARY: BCBS      Policy#:  [id number]      Subscriber:  spouse CM Name:  Lubertha South      Phone#:  [phone]     Fax#:  [phone] Pre-Cert#:  [id number], authorized 07/26/15- 08/01/15 with update due 08/02/15       Employer:  disabled Benefits:  Phone #:  [phone]     Name:   Eff. Date:  10/16/10     Deduct:  $250      Out of Pocket Max:  $1000      Life Max:  none CIR:  100%      SNF:  100% Outpatient:  100%     Co-Pay:  Home Health:  100%      Co-Pay:   DME:  100%     Co-Pay:  Providers:  In network [id number]      Policy#:       Subscriber:  CM Name:       Phone#:      Fax#:  Pre-Cert#:       Employer:  Benefits:  Phone #:      Name:  Eff. 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 RH-female with history of HTN, DM, diastolic CHF, COPD-oxygen dependent, MS with BLE weakness-wheelchair bound, ESRD-HD MWF who was admitted on 07/12/15 with generalized seizure and transient  confusion. CT head negative and EEG done revealing focal sharp wave Left frontal region suggestive of focal disturbance with epileptogenic potential. She was placed on Depakote for seizures per neurology input. 2D echo with EF 50-55% with akinesis of basal-midinferior myocardium, grade 3 diastolic dysfunction moderate MVR and small to moderate pericardial effusion--no tamponade. Cardiology consulted for input and felt that elevated cardiac enzymes likely due to CHF. Blood pressures have been labile and treated with cardene drip. She had decrease in MS with confusion after dialysis on 10/05 and MRI/MRA brain done revealing acute infarcts in bilateral occipital and frontal lobes and chronic hemorrhage in brain stem and left temporal lobes due to HTN. Dr. Roda Shutters recommended ASA for embolic stroke question due to hypotension v/s AFib. She  has had persistent lethargy and repeat EEG showed improvement in frontal sharps but continued parieto-oocipital sharp waves therefore Vimpat added for seizure control. She was noted to be febrile with lethargy on 10/12 due to urosepsis and antibiotics narrowed to Rocephin. She is not a candidate for anticoagulation therefore ILR not indicated per cardiology. Patient has had hypotensive episodes correlating to infarct and to have 30 day cardiac event monitoring as outpatient. Husband has been refusing Vimpat therefore this was discontinued by Neuro. Depakote levels therapeutic on 750 mg bid. Mentation and activity tolerance is improving and therapy has been ongoing. Patient with resultant weakness as well as cognitive deficits affecting mobility. Family and PT requesting CIR for follow up therapy.  Pt. With 5 beat run of non sustained  vtach this am, husband had refused pt's coreg      Past Medical History  Past Medical History  Diagnosis Date  . Diabetes mellitus without complication (HCC)   . Chronic diastolic CHF (congestive heart failure) (HCC)     a. 01/2007 MV: No  isch/infarct, nl EF;  b. 2012 Cath: "normal" per patient.  Performed in [place], [place] by Dr. Graciela Husbands.  . Pulmonary hypertension (HCC)     a. on home O2 @ 3lpm 24hrs/day  . HTN (hypertension)     a. Dx in 1993.  Marland Kitchen LVH (left ventricular hypertrophy)   . OSA (obstructive sleep apnea)     a. uses CPAP  . Physical deconditioning   . Renal disorder   . COPD (chronic obstructive pulmonary disease) (HCC)     Family History  family history includes CAD in her mother; Cancer in her father and mother; Diabetes in her father and mother; Heart attack in her father; Hyperlipidemia in her father; Hypertension in her father and mother.  Prior Rehab/Hospitalizations:  Has the patient had major surgery during 100 days prior to admission? No  Current Medications   Current facility-administered medications:  .  acetaminophen (TYLENOL) tablet 650 mg, 650 mg, Oral, Q6H PRN, 650 mg at 07/21/15 1746 **OR** acetaminophen (TYLENOL) suppository 650 mg, 650 mg, Rectal, Q6H PRN, Lorretta Harp, MD .  amLODipine (NORVASC) tablet 5 mg, 5 mg, Oral, Daily, Beryle Lathe, MD .  antiseptic oral rinse (CPC / CETYLPYRIDINIUM CHLORIDE 0.05%) solution 7 mL, 7 mL, Mouth Rinse, BID, Ripudeep K Rai, MD, 7 mL at 07/26/15 1000 .  aspirin EC tablet 325 mg, 325 mg, Oral, Daily, Ripudeep K Rai, MD, 325 mg at 07/25/15 1016 .  calcitRIOL (ROCALTROL) capsule 0.25 mcg, 0.25 mcg, Oral, Q M,W,F-HD, Jetty Duhamel, NP, 0.25 mcg at 07/23/15 1051 .  carvedilol (COREG) tablet 12.5 mg, 12.5 mg, Oral, BID WC, Saima Rizwan, MD, 12.5 mg at 07/25/15 1015 .  cefTRIAXone (ROCEPHIN) 1 g in dextrose 5 % 50 mL IVPB, 1 g, Intravenous, Q24H, Herby Abraham, RPH, 1 g at 07/25/15 1649 .  Darbepoetin Alfa (ARANESP) injection 60 mcg, 60 mcg, Intravenous, Q Wed-HD, Jetty Duhamel, NP, 60 mcg at 07/22/15 1200 .  divalproex (DEPAKOTE) DR tablet 750 mg, 750 mg, Oral, Q12H, Layne Benton, NP, 750 mg at 07/25/15 1015 .  doxazosin (CARDURA) tablet 2 mg, 2 mg, Oral,  Daily, Calvert Cantor, MD, 2 mg at 07/25/15 1015 .  heparin injection 5,000 Units, 5,000 Units, Subcutaneous, 3 times per day, Lorretta Harp, MD, 5,000 Units at 07/25/15 2206 .  insulin aspart (novoLOG) injection 0-9 Units, 0-9 Units, Subcutaneous, TID WC, Lorretta Harp, MD, Stopped at 07/15/15 0800 .  insulin glargine (LANTUS) injection 3 Units,  3 Units, Subcutaneous, Daily, Lorretta Harp, MD, 3 Units at 07/15/15 [phone] .  LORazepam (ATIVAN) injection 0.5-1 mg, 0.5-1 mg, Intravenous, Q1H PRN, Lorretta Harp, MD .  metoprolol (LOPRESSOR) 1 MG/ML injection, , , ,  .  metoprolol (LOPRESSOR) injection 5 mg, 5 mg, Intravenous, Q4H PRN, Calvert Cantor, MD, 5 mg at 07/26/15 1541 .  ondansetron (ZOFRAN) tablet 4 mg, 4 mg, Oral, Q6H PRN **OR** ondansetron (ZOFRAN) injection 4 mg, 4 mg, Intravenous, Q6H PRN, Lorretta Harp, MD, 4 mg at 07/20/15 2204 .  pantoprazole (PROTONIX) EC tablet 40 mg, 40 mg, Oral, Daily, Herby Abraham, RPH, 40 mg at 07/25/15 1015 .  pravastatin (PRAVACHOL) tablet 40 mg, 40 mg, Oral, q1800, Herby Abraham, RPH, 40 mg at 07/24/15 1734 .  sevelamer carbonate (RENVELA) tablet 800 mg, 800 mg, Oral, TID WC, Ripudeep Jenna Luo, MD, Stopped at 07/15/15 0800 .  sodium chloride 0.9 % injection 3 mL, 3 mL, Intravenous, Q12H, Lorretta Harp, MD, 3 mL at 07/26/15 1043  Patients Current Diet: DIET DYS 3 Room service appropriate?: Yes; Fluid consistency:: Thin Diet - low sodium heart healthy  Precautions / Restrictions Precautions Precautions: Fall Restrictions Weight Bearing Restrictions: No   Has the patient had 2 or more falls or a fall with injury in the past year?No  Prior Activity Level Community (5-7x/wk): Pt. was out of the home 3-4 times per week for HD, chruch and would ride with husband for short shopping trips (would remain in the car)  Home Assistive Devices / Equipment Home Assistive Devices/Equipment: CBG Meter, Environmental consultant (specify type)  Prior Device Use: Indicate devices/aids used by the patient prior to  current illness, exacerbation or injury? Walker  Prior Functional Level Prior Function Level of Independence: Needs assistance Gait / Transfers Assistance Needed: pt walked with walker PTA and used w/c at times due to MS ADL's / Homemaking Assistance Needed: husband cooked and cleaned, bathed pt, dressed pt, helped pt toilet.  Pt only got sponge bathes. Comments: Pt not following many commands. Husband answering most questions.  Self Care: Did the patient need help bathing, dressing, using the toilet or eating?  Needed some help  Indoor Mobility: Did the patient need assistance with walking from room to room (with or without device)? Needed some help  Stairs: Did the patient need assistance with internal or external stairs (with or without device)? Needed some help  Functional Cognition: Did the patient need help planning regular tasks such as shopping or remembering to take medications? Needed some help  Current Functional Level Cognition  Arousal/Alertness: Lethargic Overall Cognitive Status: Impaired/Different from baseline Current Attention Level: Selective Orientation Level: Oriented X4 Following Commands: Follows one step commands consistently (improved from last week) Safety/Judgement: Decreased awareness of safety, Decreased awareness of deficits General Comments: pt able to keep eyes opened this date and was able to actively participate Attention: Focused Focused Attention: Impaired Focused Attention Impairment: Verbal basic, Functional basic Memory: Impaired Memory Impairment: Storage deficit, Retrieval deficit Problem Solving: Impaired Problem Solving Impairment: Verbal basic, Functional basic Executive Function:  (all impaired due to lower level deficits) Behaviors: Perseveration Safety/Judgment: Impaired    Extremity Assessment (includes Sensation/Coordination)  Upper Extremity Assessment: RUE deficits/detail, LUE deficits/detail, Difficult to assess due to  impaired cognition RUE Deficits / Details: moves on command but not through full ROM.  Pt cannot hold arm in air when asked but this may be related to attn. RUE Sensation:  (difficult to assess.) RUE Coordination: decreased fine motor, decreased gross motor LUE Deficits /  Details: see RUE LUE Sensation:  (difficult to assess.) LUE Coordination: decreased fine motor, decreased gross motor  Lower Extremity Assessment: Defer to PT evaluation    ADLs  Overall ADL's : Needs assistance/impaired Eating/Feeding: Total assistance, Sitting Eating/Feeding Details (indicate cue type and reason): worked on bringing cup to mouth, husband reports feeding her this morning Grooming: Wash/dry hands, Wash/dry face, Moderate assistance, Bed level Grooming Details (indicate cue type and reason): unable to balance at EOB for grooming, performed at bed level Upper Body Bathing: Total assistance Lower Body Bathing: Total assistance Upper Body Dressing : Total assistance Lower Body Dressing: Total assistance Toilet Transfer: +2 for physical assistance, Total assistance, Stand-pivot, BSC Toilet Transfer Details (indicate cue type and reason): pt not initiating moving own feet during transfers. Toileting- Clothing Manipulation and Hygiene: +2 for physical assistance, Total assistance, Sit to/from stand Functional mobility during ADLs: Total assistance, +2 for physical assistance General ADL Comments: Worked on sitting balance and trunk strength at EOB, pt with flexed trunk, but responded favorable to faciltation for extension of spine and shoulder retraction.  Dependent on one hand on bed or rail for balance while reaching with the opposite in moderate wide ranges.    Mobility  Overal bed mobility: Needs Assistance Bed Mobility: Supine to Sit Rolling: Max assist Sidelying to sit: Max assist Supine to sit: Mod assist, +2 for physical assistance Sit to supine: Max assist General bed mobility comments: pt up on  BSC upon PT arrival    Transfers  Overall transfer level: Needs assistance Equipment used: 2 person hand held assist Transfers: Sit to/from Stand, Stand Pivot Transfers Sit to Stand: Max assist, +2 safety/equipment Stand pivot transfers: Max assist, +2 safety/equipment Squat pivot transfers: Total assist, +2 physical assistance General transfer comment: pt able to push up with UEs, maxA at posterior hips with max directional v/c's to maintain upright position. pt fatigued quickly. pt stood x 2 min with maxA for hygiene    Ambulation / Gait / Stairs / Wheelchair Mobility  Ambulation/Gait Ambulation/Gait assistance: Max assist, +2 physical assistance, +2 safety/equipment (3rd person for chair follow) Ambulation Distance (Feet): 15 Feet Assistive device: Rolling walker (2 wheeled) Gait Pattern/deviations: Decreased step length - right General Gait Details: pt required assist to advance L LE and maintain grip on walker. assist for walker management and to maitnain upright posture. v/c's for sequencing steps Gait velocity: slow    Posture / Balance Dynamic Sitting Balance Sitting balance - Comments: pt requires physical assist as pt with anterior lean and no self correction Balance Overall balance assessment: Needs assistance Sitting-balance support: Feet supported Sitting balance-Leahy Scale: Poor Sitting balance - Comments: pt requires physical assist as pt with anterior lean and no self correction Postural control: Left lateral lean Standing balance support: Bilateral upper extremity supported Standing balance-Leahy Scale: Poor Standing balance comment: Stood with MAX of 2    Special needs/care consideration BiPAP/CPAP  CPAP CPM no Continuous Drip IV no Dialysis no          Life Vest  no Oxygen   no Special Bed no Trach Size   no Wound Vac (area)   no       Skin L chest wall HD catheter   ; L second toe scab                          Bowel mgmt: last BM 07/26/15, continent with  assist to [facility] Bladder mgmt: anuric Diabetic mgmt yes  Previous Home Environment Living Arrangements: Spouse/significant other  Lives With: Spouse Available Help at Discharge: Family Home Care Services: Yes Type of Home Care Services: Home RT Home Care Agency (if known): [facility]  Discharge Living Setting Plans for Discharge Living Setting: Patient's home Type of Home at Discharge: House Discharge Home Layout: One level Discharge Home Access: Stairs to enter Entrance Stairs-Rails: Right, Left Entrance Stairs-Number of Steps: 5 Discharge Bathroom Shower/Tub: Walk-in shower (husband sponge bathes pt. due to HD cath) Discharge Bathroom Toilet: Standard Discharge Bathroom Accessibility: Yes How Accessible: Accessible via walker Does the patient have any problems obtaining your medications?: No  Social/Family/Support Systems Patient Roles: Spouse Anticipated Caregiver: husband Unique Sillas, (H) [phone], (C) [phone] Ability/Limitations of Caregiver: pt. lost his job this year, available 24/7 to care for pt. Caregiver Availability: 24/7 Discharge Plan Discussed with Primary Caregiver: Yes Is Caregiver In Agreement with Plan?: Yes Does Caregiver/Family have Issues with Lodging/Transportation while Pt is in Rehab?: No   Goals/Additional Needs Patient/Family Goal for Rehab: supervision and min assist for PT/OT/SLP Expected length of stay: 14-20 days Cultural Considerations: no Dietary Needs: dysphagia #3, thin liquids Equipment Needs: TBA Pt/Family Agrees to Admission and willing to participate: Yes Program Orientation Provided & Reviewed with Pt/Caregiver Including Roles  & Responsibilities: Yes   Decrease burden of Care through IP rehab admission: no   Possible need for SNF placement upon discharge:   Not expected   Patient Condition: This patient's medical and functional status has changed since the consult dated: 07/22/15  in which the Rehabilitation  Physician determined and documented that the patient's condition is appropriate for intensive rehabilitative care in an inpatient rehabilitation facility. See "History of Present Illness" (above) for medical update. Functional changes [id number] and functional improvements include + 2 max assist transfers and 15' gait with RW . Patient's medical and functional status update has been discussed with the Rehabilitation physician and patient remains appropriate for inpatient rehabilitation. Will admit to inpatient rehab today.  Preadmission Screen Completed By:  [name], 07/26/2015 5:31 PM ______________________________________________________________________   Discussed status with Dr.  Allena Katz on 07/26/15 at  1730  and received telephone approval for admission today.  Admission Coordinator:  [name], time 1730 Dorna Bloom 07/26/15

## 2015-07-26 NOTE — Progress Notes (Signed)
Assessment/Plan: Seizures (new) / bilat acute CVA's - on depakote and vimpat, EEG + but showed some improvement on 10/10. New acute scattered CVA's by MRI. Cardiology suspects intermittent afib.  1. Will need to prevent drops in BP while on HD to prevent recurrent watershed infarcts (pt with severe LVH and is very sensitive to volume removal 2. ESRD - MWF [place]. HD- Below dry wt. Need to limit fluid removal due to severe LVH and sensitive BP with UF. Dialysis Orders: MWF [facility] 4 hrs 70.5kgs (new EDW 65.5kg) 2K/2.5CA L IJ Cath (no perm access) Heparin none Micera 75q 2 weeks 3. Hypertension/volume - BP's variable SBP 130-190s- symptomatic with BP drop- runs 180s at home. 3 kg under dry wt No UF w HD for now.  4. Vascular access - using PC, no permanent access. Will defer to her primary nephrologist in [place].   Subjective: Interval History: none.  Objective: Vital signs in last 24 hours: Temp:  [97.7 F (36.5 C)-98 F (36.7 C)] 97.8 F (36.6 C) (10/17 0436) Pulse Rate:  [63-65] 65 (10/17 0436) Resp:  [17-18] 18 (10/17 0436) BP: (150-174)/(53-67) 150/62 mmHg (10/17 0436) SpO2:  [97 %-100 %] 100 % (10/17 0436) Weight:  [68.629 kg (151 lb 4.8 oz)] 68.629 kg (151 lb 4.8 oz) (10/16 2024) Weight change: 3.039 kg (6 lb 11.2 oz)  Intake/Output from previous day: 10/16 0701 - 10/17 0700 In: 360 [P.O.:360] Out: 0  Intake/Output this shift:    General appearance: alert and flat affect Resp: clear to auscultation bilaterally Chest wall: no tenderness, PC in left chest Cardio: regular rate and rhythm, S1, S2 normal, no murmur, click, rub or gallop Extremities: edema tr  Lab Results:  Recent Labs  07/23/15 1600  WBC 8.6  HGB 10.1*  HCT 32.7*  PLT 182   BMET:  Recent Labs  07/23/15 1600  NA 135  K 3.3*  CL 96*  CO2 28  GLUCOSE 105*  BUN 12  CREATININE 2.98*  CALCIUM 8.7*   No results for input(s): PTH in the last 72 hours. Iron Studies: No  results for input(s): IRON, TIBC, TRANSFERRIN, FERRITIN in the last 72 hours. Studies/Results: No results found.  Scheduled: . antiseptic oral rinse  7 mL Mouth Rinse BID  . aspirin EC  325 mg Oral Daily  . calcitRIOL  0.25 mcg Oral Q M,W,F-HD  . carvedilol  12.5 mg Oral BID WC  . cefTRIAXone (ROCEPHIN)  IV  1 g Intravenous Q24H  . darbepoetin (ARANESP) injection - DIALYSIS  60 mcg Intravenous Q Wed-HD  . divalproex  750 mg Oral Q12H  . doxazosin  2 mg Oral Daily  . heparin  5,000 Units Subcutaneous 3 times per day  . insulin aspart  0-9 Units Subcutaneous TID WC  . insulin glargine  3 Units Subcutaneous Daily  . pantoprazole  40 mg Oral Daily  . pravastatin  40 mg Oral q1800  . sevelamer carbonate  800 mg Oral TID WC  . sodium chloride  3 mL Intravenous Q12H    LOS: 14 days   Maddelyn Rocca C 07/26/2015,10:57 AM

## 2015-07-27 ENCOUNTER — Inpatient Hospital Stay (HOSPITAL_COMMUNITY): Payer: Medicare Other | Admitting: Physical Therapy

## 2015-07-27 ENCOUNTER — Inpatient Hospital Stay (HOSPITAL_COMMUNITY): Payer: Medicare Other | Admitting: Occupational Therapy

## 2015-07-27 ENCOUNTER — Inpatient Hospital Stay (HOSPITAL_COMMUNITY): Payer: BLUE CROSS/BLUE SHIELD

## 2015-07-27 DIAGNOSIS — I69319 Unspecified symptoms and signs involving cognitive functions following cerebral infarction: Secondary | ICD-10-CM

## 2015-07-27 DIAGNOSIS — R269 Unspecified abnormalities of gait and mobility: Secondary | ICD-10-CM

## 2015-07-27 DIAGNOSIS — G35 Multiple sclerosis: Secondary | ICD-10-CM

## 2015-07-27 DIAGNOSIS — G8222 Paraplegia, incomplete: Secondary | ICD-10-CM

## 2015-07-27 DIAGNOSIS — I69398 Other sequelae of cerebral infarction: Secondary | ICD-10-CM

## 2015-07-27 LAB — GLUCOSE, CAPILLARY
GLUCOSE-CAPILLARY: 89 mg/dL (ref 65–99)
GLUCOSE-CAPILLARY: 89 mg/dL (ref 65–99)
GLUCOSE-CAPILLARY: 122 mg/dL — AB (ref 65–99)
GLUCOSE-CAPILLARY: 115 mg/dL — AB (ref 65–99)

## 2015-07-27 MED ORDER — PANTOPRAZOLE SODIUM 40 MG PO TBEC
40.0000 mg | DELAYED_RELEASE_TABLET | Freq: Every day | ORAL | Status: DC
  Administered 2015-07-28 – 2015-08-06 (×8): 40 mg via ORAL
  Filled 2015-07-27 (×10): qty 1

## 2015-07-27 NOTE — Progress Notes (Signed)
Patient information reviewed and entered into eRehab system by Scarlettrose Costilow, RN, CRRN, PPS Coordinator.  Information including medical coding and functional independence measure will be reviewed and updated through discharge.    

## 2015-07-27 NOTE — Progress Notes (Signed)
Placed patient on CPAP for the night with oxygen set at 2lpm. 

## 2015-07-27 NOTE — Interval H&P Note (Signed)
Gail Chapman was admitted today to Inpatient Rehabilitation with the diagnosis of bilateral occipital and frontal lobe embolic infarcts.  The patient's history has been reviewed, patient examined, and there is no change in status.  Patient continues to be appropriate for intensive inpatient rehabilitation.  I have reviewed the patient's chart and labs.  Questions were answered to the patient's satisfaction. The PAPE has been reviewed and assessment remains appropriate.  Gail Chapman Juba 07/27/2015, 7:08 AM

## 2015-07-27 NOTE — Evaluation (Signed)
Occupational Therapy Assessment and Plan  Patient Details  Name: Gail Chapman MRN: [id number] Date of Birth: 23-Oct-1955  OT Diagnosis: apraxia, cognitive deficits, disturbance of vision, hemiplegia affecting non-dominant side and muscle weakness (generalized) Rehab Potential: Rehab Potential (ACUTE ONLY): Fair ELOS: 14-16 days   Today's Date: 07/27/2015 OT Individual Time: 0930-1100 OT Individual Time Calculation (min): 90 min     Problem List:  Patient Active Problem List   Diagnosis Date Noted  . Gait disturbance, post-stroke 07/22/2015  . Stroke with cerebral ischemia ([place])   . Cerebral embolism with cerebral infarction 07/16/2015  . Cerebral thrombosis with cerebral infarction 07/16/2015  . Seizures ([place]) 07/13/2015  . Elevated troponin 07/13/2015  . MS (multiple sclerosis) ([place]) 07/13/2015  . ESRD on dialysis ([place]) 07/13/2015  . Acute on chronic diastolic heart failure ([place]) 07/13/2015  . COPD (chronic obstructive pulmonary disease) ([place])   . Acute encephalopathy 07/12/2015  . Pericardial effusion 05/05/2015  . ESRD (end stage renal disease) ([place]) 11/24/2013  . Chronic kidney disease, stage 3 08/01/2013  . Pre-operative cardiovascular examination 06/11/2013  . Altered mental status 02/26/2013  . Acute on chronic renal failure ([place]) 02/24/2013  . Hypertensive crisis 02/19/2013  . Chronic renal failure 02/19/2013  . Acute respiratory failure ([place]) 02/19/2013  . Physical deconditioning   . OSA (obstructive sleep apnea)   . Hypertensive emergency   . Sleep apnea   . Pulmonary hypertension ([place])   . Chronic diastolic CHF (congestive heart failure) ([place])   . Diabetes mellitus with renal complications [facility])     Past Medical History:  Past Medical History  Diagnosis Date  . Diabetes mellitus without complication ([place])   . Chronic diastolic CHF (congestive heart failure) ([place])     a. 01/2007 MV: No isch/infarct, nl EF;  b. 2012 Cath: "normal" per patient.  Performed in  [place], [place] by Dr. Janith Lima.  . Pulmonary hypertension ([place])     a. on home O2 @ 3lpm 24hrs/day  . HTN (hypertension)     a. Dx in 1993.  Marland Kitchen LVH (left ventricular hypertrophy)   . OSA (obstructive sleep apnea)     a. uses CPAP  . Physical deconditioning   . Renal disorder   . COPD (chronic obstructive pulmonary disease) ([place])    Past Surgical History:  Past Surgical History  Procedure Laterality Date  . Knee surgery    . Carpal tunnel release    . Tubal ligation    . Cholecystectomy    . Mass excision      a. under axilla.  . Subxyphoid pericardial window N/A 02/26/2013    Procedure: SUBXYPHOID PERICARDIAL WINDOW;  Surgeon: Grace Isaac, MD;  Location: [facility] OR;  Service: Thoracic;  Laterality: N/A;  . Left and right heart catheterization with coronary angiogram N/A 02/25/2013    Procedure: LEFT AND RIGHT HEART CATHETERIZATION WITH CORONARY ANGIOGRAM;  Surgeon: Jolaine Artist, MD;  Location: [facility] CATH LAB;  Service: Cardiovascular;  Laterality: N/A;  . Tee without cardioversion N/A 07/23/2015    Procedure: TRANSESOPHAGEAL ECHOCARDIOGRAM (TEE);  Surgeon: Fay Records, MD;  Location: [facility] ENDOSCOPY;  Service: Cardiovascular;  Laterality: N/A;  PT NEED A LOOP    Assessment & Plan Clinical Impression:  Gail Chapman is a 59 y.o. RH-female with history of HTN, DM, diastolic CHF, COPD-oxygen dependent, MS with BLE weakness-wheelchair bound, ESRD-HD MWF who was admitted on 07/12/15 with generalized seizure and transient confusion.  CT head negative and EEG done revealing focal sharp wave Left frontal  region suggestive of focal disturbance with epileptogenic potential.  She was placed on Depakote for seizures per neurology input. 2D echo with EF 50-55% with akinesis of basal-midinferior myocardium, grade 3 diastolic dysfunction moderate MVR and small to moderate pericardial effusion--no tamponade. Cardiology consulted for input and felt that elevated cardiac enzymes likely due to CHF. Blood  pressures have been labile and treated with cardene drip. She had decrease in mental status with confusion after dialysis on 10/05 and  MRI/MRA brain done revealing acute infarcts in bilateral occipital and frontal lobes and chronic hemorrhage in brain stem and left temporal lobes due to HTN.  Dr. Erlinda Hong recommended ASA for embolic stroke question due to hypotension v/s AFib.  She has had persistent lethargy and repeat EEG showed improvement in frontal sharps but continued parieto-oocipital sharp waves therefore Vimpat added for seizure control. She was noted to be febrile with lethargy on 10/12 due to urosepsis and antibiotics narrowed to Rocephin.  She is not a candidate for anticoagulation therefore  ILR not indicated per cardiology. Patient has had hypotensive episodes correlating to infarct and to have 30 day cardiac event monitoring as outpatient.    Husband has been refusing Vimpat therefore this was discontinued by Neuro. Depakote levels therapeutic on 750 mg bid.  Mentation and activity tolerance is improving and therapy has been ongoing. Patient with resultant weakness as well as cognitive deficits affecting mobility.  Family and PT requesting CIR for follow up therapy.     Pt used a walker at baseline for ambulation. Patient transferred to CIR on 07/26/2015 .    Patient currently requires total with basic self-care skills secondary to muscle weakness, decreased cardiorespiratoy endurance, motor apraxia and decreased coordination, decreased visual perceptual skills and decreased visual motor skills, decreased attention to left, decreased initiation, decreased attention, decreased awareness, decreased problem solving, decreased memory and delayed processing and decreased sitting balance, decreased standing balance and decreased postural control.  Prior to hospitalization, patient could complete basic self care with mod.  Patient will benefit from skilled intervention to increase independence with basic  self-care skills prior to discharge home with care partner.  Anticipate patient will require 24 hour supervision and moderate physical assestance and follow up home health.  OT - End of Session Activity Tolerance: Tolerates < 10 min activity, no significant change in vital signs Endurance Deficit: Yes Endurance Deficit Description: lethargic, unable to stay alert for full session OT Assessment Rehab Potential (ACUTE ONLY): Fair OT Patient demonstrates impairments in the following area(s): Balance;Cognition;Endurance;Motor;Perception;Sensory;Vision OT Basic ADL's Functional Problem(s): Eating;Grooming;Bathing;Dressing;Toileting OT Transfers Functional Problem(s): Toilet OT Additional Impairment(s): Fuctional Use of Upper Extremity OT Plan OT Intensity: Minimum of 1-2 x/day, 45 to 90 minutes OT Frequency: 5 out of 7 days OT Duration/Estimated Length of Stay: 14-16 days OT Treatment/Interventions: Balance/vestibular training;Cognitive remediation/compensation;Discharge planning;DME/adaptive equipment instruction;Functional electrical stimulation;Functional mobility training;Neuromuscular re-education;Patient/family education;Self Care/advanced ADL retraining;Therapeutic Activities;Therapeutic Exercise;UE/LE Strength taining/ROM;UE/LE Coordination activities;Visual/perceptual remediation/compensation OT Self Feeding Anticipated Outcome(s): min A OT Basic Self-Care Anticipated Outcome(s): mod A OT Toileting Anticipated Outcome(s): mod A OT Bathroom Transfers Anticipated Outcome(s): mod A to toilet OT Recommendation Patient destination: Home Follow Up Recommendations: Home health OT Equipment Recommended: To be determined   Skilled Therapeutic Intervention Pt seen for initial evaluation and ADL retraining with a focus on attention to task, use of LUE with one step directions, sit to stand in [place], and balance. Pt overall requiring max-total A with all mobility and self care due to severe  weakness, decreased cognition, impaired balance. Pt was  alert about 50% of the session. Pt very pleasant and cooperative with therapy, but needs a great deal of extra cuing and processing time to follow directions. NT assisted this therapist with toilet transfers and toileting using the steady.  Once self care was completed, pt taken to gym to work on [place] with ball reaches and visual perception with copying simple designs, writing name, drawing clock. Pt demonstrated L inattention, max imp spatial awareness, perseveration, and sev imp FMC. Pt taken back to room to rest. Resting in w/c with quick release belt and all needs met.  OT Evaluation Precautions/Restrictions  Precautions Precautions: Fall Precaution Comments: hx of MS with baseline visual changes Restrictions Weight Bearing Restrictions: No    Vital Signs Therapy Vitals Pulse Rate: 74 BP: (!) 121/53 mmHg Pain Pain Assessment Pain Assessment: No/denies pain Pain Score: 0-No pain Home Living/Prior Functioning Home Living Family/patient expects to be discharged to:: Private residence Living Arrangements: Spouse/significant other Available Help at Discharge: Family Type of Home: House Home Access: Stairs to enter Technical brewer of Steps: 5-6 Entrance Stairs-Rails: Can reach both Home Layout: One level Bathroom Shower/Tub: Gaffer, Charity fundraiser: Standard Bathroom Accessibility: Yes  Lives With: Spouse Prior Function Level of Independence: Needs assistance with ADLs, Needs assistance with homemaking, Needs assistance with gait, Needs assistance with tranfers  Able to Take Stairs?: Yes (with assistance) Driving: No Leisure: Hobbies-yes (Comment) Comments: enjoys going to church ADL  refer to functional navigator Vision/Perception  Vision- History Baseline Vision/History: Wears glasses Wears Glasses: At all times Patient Visual Report: Blurring of vision Vision- Assessment Vision Assessment?:  Yes Eye Alignment: Within Functional Limits Ocular Range of Motion: Restricted on the left;Impaired-to be further tested in functional context Alignment/Gaze Preference: Gaze right Tracking/Visual Pursuits: Right eye does not track laterally;Left eye does not track laterally;Impaired - to be further tested in functional context (eyes do not track to the left) Saccades: Impaired - to be further tested in functional context Convergence: Impaired - to be further tested in functional context Visual Fields: Left visual field deficit Additional Comments: pt was able to accurately read clock on the wall  Cognition Overall Cognitive Status: Impaired/Different from baseline Arousal/Alertness: Lethargic Orientation Level: Person;Place;Situation Person: Oriented Place: Oriented Situation: Oriented Year: 2016 Month: October Day of Week: Incorrect Memory: Impaired Memory Impairment: Storage deficit;Retrieval deficit;Decreased recall of new information;Decreased long term memory;Decreased short term memory Decreased Long Term Memory: Verbal basic Decreased Short Term Memory: Verbal basic Immediate Memory Recall: Blue;Bed Memory Recall: Blue;Bed Memory Recall Blue: Without Cue Memory Recall Bed: Without Cue Attention: Focused Focused Attention: Impaired Focused Attention Impairment: Verbal basic;Functional basic Awareness: Impaired Awareness Impairment: Emergent impairment Problem Solving: Impaired Problem Solving Impairment: Verbal basic;Functional basic Behaviors: Perseveration Safety/Judgment: Impaired Sensation Sensation Light Touch: Appears Intact Stereognosis: Not tested Hot/Cold: Appears Intact Proprioception: Appears Intact Coordination Gross Motor Movements are Fluid and Coordinated: No Fine Motor Movements are Fluid and Coordinated: No Finger Nose Finger Test: pt able to perform test very slowly with slow, inaccurate movements Heel Shin Test: unable to perform on command and  with demonstration and physical assist on either LE Motor   refer to functional navigator Mobility  Bed Mobility Bed Mobility: Sit to Supine;Supine to Sit Supine to Sit: 1: +1 Total assist;HOB flat;With rails Supine to Sit Details: Tactile cues for initiation;Tactile cues for sequencing;Tactile cues for weight shifting;Tactile cues for placement;Tactile cues for weight beaing;Visual cues/gestures for sequencing;Visual cues/gestures for precautions/safety;Verbal cues for sequencing;Verbal cues for technique;Verbal cues for precautions/safety;Manual facilitation for weight shifting;Manual  facilitation for placement;Manual facilitation for weight bearing Sit to Supine: 2: Max assist Sit to Supine - Details: Verbal cues for sequencing;Tactile cues for initiation;Tactile cues for sequencing;Tactile cues for weight shifting;Tactile cues for posture;Verbal cues for technique;Verbal cues for precautions/safety;Manual facilitation for weight shifting;Manual facilitation for placement;Manual facilitation for weight bearing  Trunk/Postural Assessment  Postural Control Postural Control: Deficits on evaluation Trunk Control: mod Impaired with fluctating leans to left or right Righting Reactions: max  impaired Protective Responses: max impaired  Balance Balance Balance Assessed: Yes Static Sitting Balance Static Sitting - Balance Support: Right upper extremity supported Static Sitting - Level of Assistance: 2: Max assist Static Sitting - Comment/# of Minutes: LOB to R with no self correction despite max cues Dynamic Sitting Balance Dynamic Sitting - Balance Support: Right upper extremity supported;Feet supported;During functional activity Dynamic Sitting - Level of Assistance: 1: +1 Total assist Dynamic Sitting Balance - Compensations: LOB to R with no self correction despite max cues while attempting to eat breakfast edge of bed Extremity/Trunk Assessment RUE Assessment RUE Assessment: Exceptions to  [facility] RUE AROM (degrees) Right Shoulder Flexion: 150 Degrees RUE Strength RUE Overall Strength Comments: 3+/5  LUE Assessment LUE Assessment: Exceptions to WFL LUE AROM (degrees) Left Shoulder Flexion: 80 Degrees LUE Strength Left Shoulder Flexion: 2-/5 Left Elbow Flexion: 3+/5 Left Hand Gross Grasp: Impaired (3+/5)   See Function Navigator for Current Functional Status.   Refer to Care Plan for Long Term Goals  Recommendations for other services: None  Discharge Criteria: Patient will be discharged from OT if patient refuses treatment 3 consecutive times without medical reason, if treatment goals not met, if there is a change in medical status, if patient makes no progress towards goals or if patient is discharged from hospital.  The above assessment, treatment plan, treatment alternatives and goals were discussed and mutually agreed upon: by patient  [facility] 07/27/2015, 12:22 PM

## 2015-07-27 NOTE — Interval H&P Note (Signed)
Gail Chapman was admitted yesterday to Inpatient Rehabilitation with the diagnosis of bilateral embolic occipital and frontal lobe infarcts.  The patient's history has been reviewed, patient examined, and there is no change in status.  Patient continues to be appropriate for intensive inpatient rehabilitation.  I have reviewed the patient's chart and labs.  Questions were answered to the patient's satisfaction. The PAPE has been reviewed and assessment remains appropriate.  Ankit Karis Juba 07/27/2015, 7:34 AM

## 2015-07-27 NOTE — Evaluation (Signed)
Speech Language Pathology Assessment and Plan  Patient Details  Name: Gail Chapman MRN: [id number] Date of Birth: 01-02-1956  SLP Diagnosis: Cognitive Impairments  Rehab Potential: Fair ELOS: 14-16 days    Today's Date: 07/27/2015 SLP Individual Time: 0630-1601 SLP Individual Time Calculation (min): 53 min   Problem List:  Patient Active Problem List   Diagnosis Date Noted  . Gait disturbance, post-stroke 07/22/2015  . Stroke with cerebral ischemia ([place])   . Cerebral embolism with cerebral infarction 07/16/2015  . Cerebral thrombosis with cerebral infarction 07/16/2015  . Seizures ([place]) 07/13/2015  . Elevated troponin 07/13/2015  . MS (multiple sclerosis) ([place]) 07/13/2015  . ESRD on dialysis ([place]) 07/13/2015  . Acute on chronic diastolic heart failure ([place]) 07/13/2015  . COPD (chronic obstructive pulmonary disease) ([place])   . Acute encephalopathy 07/12/2015  . Pericardial effusion 05/05/2015  . ESRD (end stage renal disease) ([place]) 11/24/2013  . Chronic kidney disease, stage 3 08/01/2013  . Pre-operative cardiovascular examination 06/11/2013  . Altered mental status 02/26/2013  . Acute on chronic renal failure ([place]) 02/24/2013  . Hypertensive crisis 02/19/2013  . Chronic renal failure 02/19/2013  . Acute respiratory failure ([place]) 02/19/2013  . Physical deconditioning   . OSA (obstructive sleep apnea)   . Hypertensive emergency   . Sleep apnea   . Pulmonary hypertension ([place])   . Chronic diastolic CHF (congestive heart failure) ([place])   . Diabetes mellitus with renal complications [facility])    Past Medical History:  Past Medical History  Diagnosis Date  . Diabetes mellitus without complication ([place])   . Chronic diastolic CHF (congestive heart failure) ([place])     a. 01/2007 MV: No isch/infarct, nl EF;  b. 2012 Cath: "normal" per patient.  Performed in [place], [place] by Dr. Janith Lima.  . Pulmonary hypertension ([place])     a. on home O2 @ 3lpm 24hrs/day  . HTN (hypertension)    a. Dx in 1993.  Marland Kitchen LVH (left ventricular hypertrophy)   . OSA (obstructive sleep apnea)     a. uses CPAP  . Physical deconditioning   . Renal disorder   . COPD (chronic obstructive pulmonary disease) ([place])    Past Surgical History:  Past Surgical History  Procedure Laterality Date  . Knee surgery    . Carpal tunnel release    . Tubal ligation    . Cholecystectomy    . Mass excision      a. under axilla.  . Subxyphoid pericardial window N/A 02/26/2013    Procedure: SUBXYPHOID PERICARDIAL WINDOW;  Surgeon: Grace Isaac, MD;  Location: [facility] OR;  Service: Thoracic;  Laterality: N/A;  . Left and right heart catheterization with coronary angiogram N/A 02/25/2013    Procedure: LEFT AND RIGHT HEART CATHETERIZATION WITH CORONARY ANGIOGRAM;  Surgeon: Jolaine Artist, MD;  Location: [facility] CATH LAB;  Service: Cardiovascular;  Laterality: N/A;  . Tee without cardioversion N/A 07/23/2015    Procedure: TRANSESOPHAGEAL ECHOCARDIOGRAM (TEE);  Surgeon: Fay Records, MD;  Location: [place];  Service: Cardiovascular;  Laterality: N/A;  PT NEED A LOOP    Assessment / Plan / Recommendation Clinical Impression Gail Chapman is a 59 y.o. RH-female with history of HTN, DM, diastolic CHF, COPD-oxygen dependent, MS with BLE weakness-wheelchair bound, ESRD-HD MWF who was admitted on 07/12/15 with generalized seizure and transient confusion. CT head negative and EEG done revealing focal sharp wave Left frontal region suggestive of focal disturbance with epileptogenic potential. She was placed on Depakote for seizures per neurology input.  2D echo with EF 50-55% with akinesis of basal-midinferior myocardium, grade 3 diastolic dysfunction moderate MVR and small to moderate pericardial effusion--no tamponade. Cardiology consulted for input and felt that elevated cardiac enzymes likely due to CHF. Blood pressures have been labile and treated with cardene drip. She had decrease in mental status with confusion after  dialysis on 10/05 and MRI/MRA brain done revealing acute infarcts in bilateral occipital and frontal lobes and chronic hemorrhage in brain stem and left temporal lobes due to HTN. Dr. Erlinda Hong recommended ASA for embolic stroke question due to hypotension v/s AFib. She has had persistent lethargy and repeat EEG showed improvement in frontal sharps but continued parieto-oocipital sharp waves therefore Vimpat added for seizure control. She was noted to be febrile with lethargy on 10/12 due to urosepsis and antibiotics narrowed to Rocephin. She is not a candidate for anticoagulation therefore ILR not indicated per cardiology. Patient has had hypotensive episodes correlating to infarct and to have 30 day cardiac event monitoring as outpatient. Husband has been refusing Vimpat therefore this was discontinued by Neuro. Depakote levels therapeutic on 750 mg bid. Mentation and activity tolerance is improving and therapy has been ongoing. Patient with resultant weakness as well as cognitive deficits affecting mobility. Family and PT requesting CIR for follow up therapy.Pt used a walker at baseline for ambulation. Patient transferred to CIR on 07/26/2015.   SLP cognitive evaluation reveals significant deficits in the areas of initiation, focused/sustained attention, basic problem solving, and intellectual awareness, all of which is further worsened by patient lethargy. Pt has a mild dysarthria, however would focus intervention at this time on the severity of her cognitive impairments. Pt will need skilled SLP services as well as SLP f/u upon discharge to maximize cognitive function.   Skilled Therapeutic Interventions          Cognitive-linguistic evaluation completed with results and recommendations reviewed with patient and family.     SLP Assessment  Patient will need skilled [facility] during CIR admission    Recommendations  Patient destination: Home Follow up Recommendations:  24 hour supervision/assistance;Home Health SLP Equipment Recommended: None recommended by SLP    SLP Frequency 3 to 5 out of 7 days   SLP Treatment/Interventions Cognitive remediation/compensation;Cueing hierarchy;Environmental controls;Functional tasks;Internal/external aids;Patient/family education;Speech/Language facilitation;Therapeutic Activities   Pain Pain Assessment Pain Assessment: No/denies pain Prior Functioning Cognitive/Linguistic Baseline: Information not available  Lives With: Spouse  Function:  Eating Eating Eating activity did not occur: N/A               Cognition Comprehension Comprehension assist level: Understands basic less than 25% of the time/ requires cueing >75% of the time  Expression   Expression assist level: Expresses basic 25 - 49% of the time/requires cueing 50 - 75% of the time. Uses single words/gestures.  Social Interaction Social Interaction assist level: Interacts appropriately 50 - 74% of the time - May be physically or verbally inappropriate.  Problem Solving Problem solving assist level: Solves basic less than 25% of the time - needs direction nearly all the time or does not effectively solve problems and may need a restraint for safety  Memory Memory assist level: Recognizes or recalls 25 - 49% of the time/requires cueing 50 - 75% of the time   Short Term Goals: Week 1: SLP Short Term Goal 1 (Week 1): Pt will sustain attention to functional task for 2 minutes with Max cues SLP Short Term Goal 2 (Week 1): Pt will utilize external memory aids to  facilitate recall of new and daily information with Max cues SLP Short Term Goal 3 (Week 1): Pt will verbalize at least 1 physical and 2 cognitive deficits with Max cues SLP Short Term Goal 4 (Week 1): Pt will complete basic problem solving tasks with 80% accuracy with Max cues  Refer to Care Plan for Long Term Goals  Recommendations for other services: None  Discharge Criteria: Patient will be  discharged from SLP if patient refuses treatment 3 consecutive times without medical reason, if treatment goals not met, if there is a change in medical status, if patient makes no progress towards goals or if patient is discharged from hospital.  The above assessment, treatment plan, treatment alternatives and goals were discussed and mutually agreed upon: by patient and by family   Germain Osgood, M.A. CCC-SLP [phone]  Germain Osgood 07/27/2015, 4:05 PM

## 2015-07-27 NOTE — Progress Notes (Signed)
MD made aware of patient's high BP yesterday and if PRN medication needed.  No new orders at this time, "Medications may be adjusted to better control."  Day shift to continue monitoring.

## 2015-07-27 NOTE — Evaluation (Signed)
Physical Therapy Assessment and Plan  Patient Details  Name: Gail Chapman MRN: [id number] Date of Birth: 1956-08-31  PT Diagnosis: Abnormal posture, Abnormality of gait, Cognitive deficits, Difficulty walking, Hypotonia, Impaired cognition and Muscle weakness Rehab Potential: Fair ELOS: 14-16 days   Today's Date: 07/27/2015 PT Individual Time: 0800-0900 PT Individual Time Calculation (min): 60 min    Problem List:  Patient Active Problem List   Diagnosis Date Noted  . Gait disturbance, post-stroke 07/22/2015  . Stroke with cerebral ischemia ([place])   . Cerebral embolism with cerebral infarction 07/16/2015  . Cerebral thrombosis with cerebral infarction 07/16/2015  . Seizures ([place]) 07/13/2015  . Elevated troponin 07/13/2015  . MS (multiple sclerosis) ([place]) 07/13/2015  . ESRD on dialysis ([place]) 07/13/2015  . Acute on chronic diastolic heart failure ([place]) 07/13/2015  . COPD (chronic obstructive pulmonary disease) ([place])   . Acute encephalopathy 07/12/2015  . Pericardial effusion 05/05/2015  . ESRD (end stage renal disease) ([place]) 11/24/2013  . Chronic kidney disease, stage 3 08/01/2013  . Pre-operative cardiovascular examination 06/11/2013  . Altered mental status 02/26/2013  . Acute on chronic renal failure ([place]) 02/24/2013  . Hypertensive crisis 02/19/2013  . Chronic renal failure 02/19/2013  . Acute respiratory failure ([place]) 02/19/2013  . Physical deconditioning   . OSA (obstructive sleep apnea)   . Hypertensive emergency   . Sleep apnea   . Pulmonary hypertension ([place])   . Chronic diastolic CHF (congestive heart failure) ([place])   . Diabetes mellitus with renal complications [facility])     Past Medical History:  Past Medical History  Diagnosis Date  . Diabetes mellitus without complication ([place])   . Chronic diastolic CHF (congestive heart failure) ([place])     a. 01/2007 MV: No isch/infarct, nl EF;  b. 2012 Cath: "normal" per patient.  Performed in [place], [place] by Dr. Janith Lima.   . Pulmonary hypertension ([place])     a. on home O2 @ 3lpm 24hrs/day  . HTN (hypertension)     a. Dx in 1993.  Marland Kitchen LVH (left ventricular hypertrophy)   . OSA (obstructive sleep apnea)     a. uses CPAP  . Physical deconditioning   . Renal disorder   . COPD (chronic obstructive pulmonary disease) ([place])    Past Surgical History:  Past Surgical History  Procedure Laterality Date  . Knee surgery    . Carpal tunnel release    . Tubal ligation    . Cholecystectomy    . Mass excision      a. under axilla.  . Subxyphoid pericardial window N/A 02/26/2013    Procedure: SUBXYPHOID PERICARDIAL WINDOW;  Surgeon: Grace Isaac, MD;  Location: [facility] OR;  Service: Thoracic;  Laterality: N/A;  . Left and right heart catheterization with coronary angiogram N/A 02/25/2013    Procedure: LEFT AND RIGHT HEART CATHETERIZATION WITH CORONARY ANGIOGRAM;  Surgeon: Jolaine Artist, MD;  Location: [facility] CATH LAB;  Service: Cardiovascular;  Laterality: N/A;  . Tee without cardioversion N/A 07/23/2015    Procedure: TRANSESOPHAGEAL ECHOCARDIOGRAM (TEE);  Surgeon: Fay Records, MD;  Location: [facility] ENDOSCOPY;  Service: Cardiovascular;  Laterality: N/A;  PT NEED A LOOP    Assessment & Plan Clinical Impression: GLADYCE MCRAY is a 59 y.o. RH-female with history of HTN, DM, diastolic CHF, COPD-oxygen dependent, MS with BLE weakness-wheelchair bound, ESRD-HD MWF who was admitted on 07/12/15 with generalized seizure and transient confusion. CT head negative and EEG done revealing focal sharp wave Left frontal region suggestive of focal disturbance with  epileptogenic potential. She was placed on Depakote for seizures per neurology input. 2D echo with EF 50-55% with akinesis of basal-midinferior myocardium, grade 3 diastolic dysfunction moderate MVR and small to moderate pericardial effusion--no tamponade. Cardiology consulted for input and felt that elevated cardiac enzymes likely due to CHF. Blood pressures have been labile and  treated with cardene drip. She had decrease in mental status with confusion after dialysis on 10/05 and MRI/MRA brain done revealing acute infarcts in bilateral occipital and frontal lobes and chronic hemorrhage in brain stem and left temporal lobes due to HTN. Dr. Erlinda Hong recommended ASA for embolic stroke question due to hypotension v/s AFib. She has had persistent lethargy and repeat EEG showed improvement in frontal sharps but continued parieto-oocipital sharp waves therefore Vimpat added for seizure control. She was noted to be febrile with lethargy on 10/12 due to urosepsis and antibiotics narrowed to Rocephin. She is not a candidate for anticoagulation therefore ILR not indicated per cardiology. Patient has had hypotensive episodes correlating to infarct and to have 30 day cardiac event monitoring as outpatient. Husband has been refusing Vimpat therefore this was discontinued by Neuro. Depakote levels therapeutic on 750 mg bid. Mentation and activity tolerance is improving and therapy has been ongoing. Patient with resultant weakness as well as cognitive deficits affecting mobility. Family and PT requesting CIR for follow up therapy.Pt used a walker at baseline for ambulation. Patient transferred to CIR on 07/26/2015.   Patient currently requires total with mobility secondary to muscle weakness and muscle joint tightness, decreased cardiorespiratoy endurance, impaired timing and sequencing, abnormal tone, unbalanced muscle activation, motor apraxia, decreased coordination and decreased motor planning, decreased visual acuity and decreased visual perceptual skills, decreased midline orientation and decreased attention to left, decreased initiation, decreased attention, decreased awareness, decreased problem solving, decreased safety awareness, decreased memory and delayed processing and decreased sitting balance, decreased standing balance, decreased postural control and decreased balance strategies.   Prior to hospitalization, patient required assistance with mobility and ADLs (husband not present to report) with mobility and lived with Spouse in a House home.  Home access is 5-6Stairs to enter.  Patient will benefit from skilled PT intervention to maximize safe functional mobility, minimize fall risk and decrease caregiver burden for planned discharge home with 24 hour assist.  Anticipate patient will benefit from follow up [facility] at discharge.  PT - End of Session Activity Tolerance: Tolerates < 10 min activity, no significant change in vital signs Endurance Deficit: Yes PT Assessment Rehab Potential (ACUTE/IP ONLY): Fair Barriers to Discharge: Inaccessible home environment PT Patient demonstrates impairments in the following area(s): Balance;Endurance;Motor;Nutrition;Safety;Perception PT Transfers Functional Problem(s): Bed Mobility;Bed to Chair;Car;Furniture PT Locomotion Functional Problem(s): Ambulation;Wheelchair Mobility;Stairs PT Plan PT Intensity: Minimum of 1-2 x/day ,45 to 90 minutes PT Frequency: 5 out of 7 days PT Duration Estimated Length of Stay: 14-16 days PT Treatment/Interventions: Ambulation/gait training;Balance/vestibular training;Cognitive remediation/compensation;Community reintegration;Discharge planning;Disease management/prevention;DME/adaptive equipment instruction;Functional mobility training;Neuromuscular re-education;Patient/family education;Psychosocial support;Stair training;Therapeutic Activities;Therapeutic Exercise;UE/LE Strength taining/ROM;UE/LE Coordination activities;Visual/perceptual remediation/compensation;Wheelchair propulsion/positioning PT Transfers Anticipated Outcome(s): mod A PT Locomotion Anticipated Outcome(s): mod A PT Recommendation Recommendations for Other Services: Neuropsych consult (when appropriate) Follow Up Recommendations: Home health PT;24 hour supervision/assistance Patient destination: Home Equipment Recommended: To be  determined Equipment Details: pt reports having RW, no wheelchair  Skilled Therapeutic Intervention Skilled therapeutic intervention initiated after completion of evaluation. Patient's husband departed at beginning of evaluation, therefore PLOF and history was unable to be verified as patient gave conflicting reports to this therapist and OT. Session focused on  initiation, following basic familiar commands, focused/sustained attention, and arousal. Patient required max-total A of 1-2 persons for all functional mobility and required greatly increased time and encouragement to self-feed a few bites of breakfast. Patient was extremely lethargic throughout session (arrived on unit very late last night) and required greatly increased time for processing. Retrieved 16x16 manual wheelchair/cushion for patient to increase sitting tolerance. Patient left sitting in wheelchair with quick release belt and all needs within reach.   PT Evaluation Precautions/Restrictions Precautions Precautions: Fall Precaution Comments: hx of MS with baseline visual changes Restrictions Weight Bearing Restrictions: No General Chart Reviewed: Yes Family/Caregiver Present: No  Pain Pain Assessment Pain Assessment: No/denies pain Pain Score: 0-No pain Home Living/Prior Functioning Home Living Available Help at Discharge: Family Type of Home: House Home Access: Stairs to enter [employer] of Steps: 5-6 Entrance Stairs-Rails: Can reach both Home Layout: One level Bathroom Shower/Tub: Walk-in shower;Door [employer] Toilet: Standard Bathroom Accessibility: Yes  Lives With: Spouse Prior Function Level of Independence: Needs assistance with ADLs;Needs assistance with homemaking;Needs assistance with gait;Needs assistance with tranfers  Able to Take Stairs?: Yes (with assistance) Driving: No Leisure: Hobbies-yes (Comment) Comments: enjoys going to church Vision/Perception  Vision - Assessment Eye  Alignment: Within Functional Limits Ocular Range of Motion: Restricted on the left;Impaired-to be further tested in functional context Alignment/Gaze Preference: Gaze right Tracking/Visual Pursuits: Right eye does not track laterally;Left eye does not track laterally;Impaired - to be further tested in functional context (eyes do not track to the left) Saccades: Impaired - to be further tested in functional context Convergence: Impaired - to be further tested in functional context Additional Comments: pt was able to accurately read clock on the wall  Cognition Overall Cognitive Status: Impaired/Different from baseline Arousal/Alertness: Lethargic Attention: Focused Focused Attention: Impaired Focused Attention Impairment: Verbal basic;Functional basic Memory: Impaired Memory Impairment: Storage deficit;Retrieval deficit;Decreased recall of new information;Decreased long term memory;Decreased short term memory Decreased Long Term Memory: Verbal basic Decreased Short Term Memory: Verbal basic Awareness: Impaired Awareness Impairment: Emergent impairment Problem Solving: Impaired Problem Solving Impairment: Verbal basic;Functional basic Behaviors: Perseveration Safety/Judgment: Impaired Sensation Sensation Light Touch: Appears Intact Stereognosis: Not tested Hot/Cold: Appears Intact Proprioception: Appears Intact Coordination Gross Motor Movements are Fluid and Coordinated: No Fine Motor Movements are Fluid and Coordinated: No Finger Nose Finger Test: pt able to perform test very slowly with slow, inaccurate movements Heel Shin Test: unable to perform on command and with demonstration and physical assist on either LE Motor  Motor Motor: Hemiplegia;Motor apraxia;Motor perseverations;Abnormal postural alignment and control  Mobility Bed Mobility Bed Mobility: Sit to Supine;Supine to Sit Supine to Sit: 1: +1 Total assist;HOB flat;With rails Supine to Sit Details: Tactile cues for  initiation;Tactile cues for sequencing;Tactile cues for weight shifting;Tactile cues for placement;Tactile cues for weight beaing;Visual cues/gestures for sequencing;Visual cues/gestures for precautions/safety;Verbal cues for sequencing;Verbal cues for technique;Verbal cues for precautions/safety;Manual facilitation for weight shifting;Manual facilitation for placement;Manual facilitation for weight bearing Sit to Supine: 2: Max assist Sit to Supine - Details: Verbal cues for sequencing;Tactile cues for initiation;Tactile cues for sequencing;Tactile cues for weight shifting;Tactile cues for posture;Verbal cues for technique;Verbal cues for precautions/safety;Manual facilitation for weight shifting;Manual facilitation for placement;Manual facilitation for weight bearing Transfers Transfers: Yes Squat Pivot Transfers: 1: +2 Total assist;With upper extremity assistance Squat Pivot Transfer Details: Verbal cues for sequencing;Tactile cues for initiation;Tactile cues for posture;Verbal cues for technique;Verbal cues for precautions/safety;Manual facilitation for weight shifting;Manual facilitation for placement;Manual facilitation for weight bearing Locomotion  Ambulation Ambulation: No (unsafe due  to lethargy, patient unable to maintain static standing using RW > 5 sec with max A x 2) Gait Gait: No Stairs / Additional Locomotion Stairs: No (unsafe due to lethargy, patient unable to maintain static standing using RW > 5 sec with max A x 2) Ramp: Not tested (comment) Curb: Not tested (comment) Architect: Yes Wheelchair Assistance: 5: Supervision Wheelchair Assistance Details: Verbal cues for technique;Verbal cues for Information systems manager: Both upper extremities Wheelchair Parts Management: Supervision/cueing Distance: 5 ft with max cues for initiation and attention to task  Trunk/Postural Assessment  Cervical Assessment Cervical Assessment:  Exceptions to [facility] (forward head) Thoracic Assessment Thoracic Assessment: Within Functional Limits Lumbar Assessment Lumbar Assessment: Within Functional Limits Postural Control Postural Control: Deficits on evaluation Trunk Control: decreased, forward flexed in sitting or progressive lean to R Righting Reactions: absent Protective Responses: absent  Balance Balance Balance Assessed: Yes Static Sitting Balance Static Sitting - Balance Support: Right upper extremity supported Static Sitting - Level of Assistance: 2: Max assist Static Sitting - Comment/# of Minutes: LOB to R with no self correction despite max cues Dynamic Sitting Balance Dynamic Sitting - Balance Support: Right upper extremity supported;Feet supported;During functional activity Dynamic Sitting - Level of Assistance: 1: +1 Total assist Dynamic Sitting Balance - Compensations: LOB to R with no self correction despite max cues while attempting to eat breakfast edge of bed Extremity Assessment  RUE Assessment RUE Assessment: Exceptions to [facility] RUE AROM (degrees) Right Shoulder Flexion: 150 Degrees RUE Strength RUE Overall Strength Comments: 3+/5  LUE Assessment LUE Assessment: Exceptions to WFL LUE AROM (degrees) Left Shoulder Flexion: 80 Degrees LUE Strength Left Shoulder Flexion: 2-/5 Left Elbow Flexion: 3+/5 Left Hand Gross Grasp: Impaired (3+/5) RLE Assessment RLE Assessment: Exceptions to [facility] RLE Strength RLE Overall Strength: Deficits;Due to impaired cognition RLE Overall Strength Comments: grossly 3-/5 throughout, unable to follow commands for formal MMT LLE Assessment LLE Assessment: Exceptions to [facility] LLE Strength LLE Overall Strength: Deficits;Due to impaired cognition LLE Overall Strength Comments: grossly 3-/5 throughout, unable to follow commands for formal MMT   See Function Navigator for Current Functional Status.   Refer to Care Plan for Long Term Goals  Recommendations for other  services: Neuropsych when patient appropriate  Discharge Criteria: Patient will be discharged from PT if patient refuses treatment 3 consecutive times without medical reason, if treatment goals not met, if there is a change in medical status, if patient makes no progress towards goals or if patient is discharged from hospital.  The above assessment, treatment plan, treatment alternatives and goals were discussed and mutually agreed upon: No family available/patient unable  Laretta Alstrom 07/27/2015, 12:18 PM

## 2015-07-27 NOTE — H&P (View-Only) (Signed)
Physical Medicine and Rehabilitation Admission H&P    CC: Weakness and cognitive deficits after seizures and embolic strokes   HPI: Gail Chapman is a 59 y.o. RH-female with history of HTN, DM, diastolic CHF, COPD-oxygen dependent, MS with BLE weakness-wheelchair bound, ESRD-HD MWF who was admitted on 07/12/15 with generalized seizure and transient confusion. CT head negative and EEG done revealing focal sharp wave Left frontal region suggestive of focal disturbance with epileptogenic potential. She was placed on Depakote for seizures per neurology input. 2D echo with EF 50-55% with akinesis of basal-midinferior myocardium, grade 3 diastolic dysfunction moderate MVR and small to moderate pericardial effusion--no tamponade. Cardiology consulted for input and felt that elevated cardiac enzymes likely due to CHF. Blood pressures have been labile and treated with cardene drip. She had decrease in mental status with confusion after dialysis on 10/05 and MRI/MRA brain done revealing acute infarcts in bilateral occipital and frontal lobes and chronic hemorrhage in brain stem and left temporal lobes due to HTN. Dr. Roda Shutters recommended ASA for embolic stroke question due to hypotension v/s AFib. She has had persistent lethargy and repeat EEG showed improvement in frontal sharps but continued parieto-oocipital sharp waves therefore Vimpat added for seizure control. She was noted to be febrile with lethargy on 10/12 due to urosepsis and antibiotics narrowed to Rocephin.  She is not a candidate for anticoagulation therefore  ILR not indicated per cardiology. Patient has had hypotensive episodes correlating to infarct and to have 30 day cardiac event monitoring as outpatient.    Husband has been refusing Vimpat therefore this was discontinued by Neuro. Depakote levels therapeutic on 750 mg bid.  Mentation and activity tolerance is improving and therapy has been ongoing. Patient with resultant weakness as well as  cognitive deficits affecting mobility. Family and PT requesting CIR for follow up therapy. Pt used a walker at baseline for ambulation.    Review of Systems  HENT: Negative for hearing loss.   Eyes: Positive for blurred vision. Negative for pain.  Respiratory: Negative for cough and shortness of breath.   Cardiovascular: Negative for chest pain and palpitations.  Gastrointestinal: Negative for heartburn, nausea, abdominal pain and constipation.  Genitourinary: Negative for dysuria and urgency.       Produces urine bid  Musculoskeletal: Negative for myalgias and neck pain.  Skin: Negative for itching and rash.  Neurological: Positive for focal weakness (B/l LE and LUE) and weakness. Negative for headaches.  Psychiatric/Behavioral: The patient does not have insomnia.   All other systems reviewed and are negative.     Past Medical History  Diagnosis Date  . Diabetes mellitus without complication (HCC)   . Chronic diastolic CHF (congestive heart failure) (HCC)     a. 01/2007 MV: No isch/infarct, nl EF;  b. 2012 Cath: "normal" per patient.  Performed in [place], [place] by Dr. Graciela Husbands.  . Pulmonary hypertension (HCC)     a. on home O2 @ 3lpm 24hrs/day  . HTN (hypertension)     a. Dx in 1993.  Marland Kitchen LVH (left ventricular hypertrophy)   . OSA (obstructive sleep apnea)     a. uses CPAP  . Physical deconditioning   . Renal disorder   . COPD (chronic obstructive pulmonary disease) [facility])     Past Surgical History  Procedure Laterality Date  . Knee surgery    . Carpal tunnel release    . Tubal ligation    . Cholecystectomy    . Mass excision  a. under axilla.  . Subxyphoid pericardial window N/A 02/26/2013    Procedure: SUBXYPHOID PERICARDIAL WINDOW;  Surgeon: Delight Ovens, MD;  Location: [facility] OR;  Service: Thoracic;  Laterality: N/A;  . Left and right heart catheterization with coronary angiogram N/A 02/25/2013    Procedure: LEFT AND RIGHT HEART CATHETERIZATION WITH CORONARY  ANGIOGRAM;  Surgeon: Dolores Patty, MD;  Location: [facility] CATH LAB;  Service: Cardiovascular;  Laterality: N/A;  . Tee without cardioversion N/A 07/23/2015    Procedure: TRANSESOPHAGEAL ECHOCARDIOGRAM (TEE);  Surgeon: Pricilla Riffle, MD;  Location: [facility] ENDOSCOPY;  Service: Cardiovascular;  Laterality: N/A;  PT NEED A LOOP    Family History  Problem Relation Age of Onset  . Diabetes Mother   . CAD Mother   . Cancer Mother   . Hypertension Mother   . Cancer Father   . Diabetes Father   . Hyperlipidemia Father   . Hypertension Father   . Heart attack Father     Social History:  Married. 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 Husband at home at this time and her sister has move in to assist. She was to ambulate with walker and required assistance at times. Needed assistance with transfers and ith ADLs. She eports that she has quit smoking 35 years ago--smoked for 2 years. She does not have any smokeless tobacco history on file. She reports that she does not drink alcohol or use illicit drugs.    Allergies  Allergen Reactions  . Clonidine Derivatives     Hand itching  . Hydralazine     Visual disturbances   . Labetalol     Fatigue   . Vancomycin     "shut down my kidneys"    Medications Prior to Admission  Medication Sig Dispense Refill  . aspirin EC 325 MG EC tablet Take 1 tablet (325 mg total) by mouth daily. 30 tablet 3  . carvedilol (COREG) 12.5 MG tablet Take 1 tablet (12.5 mg total) by mouth 2 (two) times daily with a meal. On takes on non dialysis day. 60 tablet 3  . doxazosin (CARDURA) 2 MG tablet Take 1 tablet (2 mg total) by mouth daily. 30 tablet 6  . insulin aspart (NOVOLOG) 100 UNIT/ML injection Inject 10 Units into the skin 3 (three) times daily with meals. Per sliding scale 10 mL 0  . insulin detemir (LEVEMIR) 100 UNIT/ML injection Inject 0.04 mLs (4 Units total) into the skin at bedtime. Hold if CBG<150 10 mL 0  . glucose blood (FREESTYLE LITE)  test strip Use as instructed (Patient not taking: Reported on 05/05/2015) 100 each 12  . promethazine (PHENERGAN) 25 MG suppository Place 1 suppository (25 mg total) rectally at bedtime. (Patient not taking: Reported on 05/05/2015) 12 each 3    Home: Home Living Family/patient expects to be discharged to:: Skilled nursing facility Living Arrangements: Spouse/significant other Available Help at Discharge: Family  Lives With: Spouse   Functional History: Prior Function Level of Independence: Needs assistance Gait / Transfers Assistance Needed: pt walked with walker PTA and used w/c at times due to MS ADL's / Homemaking Assistance Needed: husband cooked and cleaned, bathed pt, dressed pt, helped pt toilet.  Pt only got sponge bathes. Comments: Pt not following many commands. Husband answering most questions.  Functional Status:  Mobility: Bed Mobility Overal bed mobility: Needs Assistance Bed Mobility: Supine to Sit Rolling: Max assist Sidelying to sit: Max assist Supine to sit: Mod assist, +2  for physical assistance Sit to supine: Max assist General bed mobility comments: pt up on BSC upon PT arrival Transfers Overall transfer level: Needs assistance Equipment used: 2 person hand held assist Transfers: Sit to/from Stand, Stand Pivot Transfers Sit to Stand: Max assist, +2 safety/equipment Stand pivot transfers: Max assist, +2 safety/equipment Squat pivot transfers: Total assist, +2 physical assistance General transfer comment: pt able to push up with UEs, maxA at posterior hips with max directional v/c's to maintain upright position. pt fatigued quickly. pt stood x 2 min with maxA for hygiene Ambulation/Gait Ambulation/Gait assistance: Max assist, +2 physical assistance, +2 safety/equipment (3rd person for chair follow) Ambulation Distance (Feet): 15 Feet Assistive device: Rolling walker (2 wheeled) Gait Pattern/deviations: Decreased step length - right General Gait Details: pt  required assist to advance L LE and maintain grip on walker. assist for walker management and to maitnain upright posture. v/c's for sequencing steps Gait velocity: slow    ADL: ADL Overall ADL's : Needs assistance/impaired Eating/Feeding: Total assistance, Sitting Eating/Feeding Details (indicate cue type and reason): worked on bringing cup to mouth, husband reports feeding her this morning Grooming: Wash/dry hands, Wash/dry face, Moderate assistance, Bed level Grooming Details (indicate cue type and reason): unable to balance at EOB for grooming, performed at bed level Upper Body Bathing: Total assistance Lower Body Bathing: Total assistance Upper Body Dressing : Total assistance Lower Body Dressing: Total assistance Toilet Transfer: +2 for physical assistance, Total assistance, Stand-pivot, BSC Toilet Transfer Details (indicate cue type and reason): pt not initiating moving own feet during transfers. Toileting- Clothing Manipulation and Hygiene: +2 for physical assistance, Total assistance, Sit to/from stand Functional mobility during ADLs: Total assistance, +2 for physical assistance General ADL Comments: Worked on sitting balance and trunk strength at EOB, pt with flexed trunk, but responded favorable to faciltation for extension of spine and shoulder retraction.  Dependent on one hand on bed or rail for balance while reaching with the opposite in moderate wide ranges.  Cognition: Cognition Overall Cognitive Status: Impaired/Different from baseline Arousal/Alertness: Lethargic Orientation Level: Oriented X4 Attention: Focused Focused Attention: Impaired Focused Attention Impairment: Verbal basic, Functional basic Memory: Impaired Memory Impairment: Storage deficit, Retrieval deficit Problem Solving: Impaired Problem Solving Impairment: Verbal basic, Functional basic Executive Function:  (all impaired due to lower level deficits) Behaviors: Perseveration Safety/Judgment:  Impaired Cognition Arousal/Alertness: Awake/alert Behavior During Therapy: Flat affect Overall Cognitive Status: Impaired/Different from baseline Area of Impairment: Following commands, Awareness, Problem solving, Attention Orientation Level: Disoriented to, Time Current Attention Level: Selective Memory: Decreased short-term memory Following Commands: Follows one step commands consistently (improved from last week) Safety/Judgement: Decreased awareness of safety, Decreased awareness of deficits Awareness: Intellectual Problem Solving: Slow processing, Requires verbal cues, Requires tactile cues General Comments: pt able to keep eyes opened this date and was able to actively participate   Blood pressure 161/91, pulse 80, temperature 98.1 F (36.7 C), temperature source Oral, resp. rate 20, height 5' 1.5" (1.562 m), weight 68.2 kg (150 lb 5.7 oz), SpO2 98 %. Physical Exam  Nursing note and vitals reviewed. Constitutional: She appears well-developed and well-nourished. No distress.  HENT:  Head: Normocephalic and atraumatic.  Mouth/Throat: Oropharynx is clear and moist.  Eyes: EOM are normal. Pupils are equal, round, and reactive to light. Right conjunctiva is injected. Left conjunctiva is injected.  Neck: Normal range of motion. Neck supple.  Cardiovascular: Normal rate and regular rhythm.   Respiratory: Effort normal and breath sounds normal. No respiratory distress. She has no wheezes.  GI:  Soft. Bowel sounds are normal. She exhibits no distension. There is no tenderness.  Musculoskeletal: She exhibits no edema or tenderness.  Strength LUE: 4-/5 grossly RUE: 4/5 grossly LLE: Hip flexor 3-/5, ankle dorsi/plantarflexion grossly RLE 4-/5 grossly   Neurological: She is alert.  Patient disoriented. She was unable to state the names of her doctors or any of her medications. Lacks insight and awareness of deficits.  No signs of aphasia noted.  Able to follow simple motor commands without  difficulty.   Skin: Skin is warm and dry. No rash noted. She is not diaphoretic. No erythema.  Psychiatric: Her affect is blunt. Her speech is delayed. She is slowed. Cognition and memory are impaired.    Results for orders placed or performed during the hospital encounter of 07/12/15 (from the past 48 hour(s))  Glucose, capillary     Status: Abnormal   Collection Time: 07/24/15  9:15 PM  Result Value Ref Range   Glucose-Capillary 110 (H) 65 - 99 mg/dL  Valproic acid level     Status: None   Collection Time: 07/25/15  6:13 AM  Result Value Ref Range   Valproic Acid Lvl 94 50.0 - 100.0 ug/mL  Glucose, capillary     Status: None   Collection Time: 07/25/15  8:18 AM  Result Value Ref Range   Glucose-Capillary 93 65 - 99 mg/dL  Glucose, capillary     Status: None   Collection Time: 07/25/15 12:01 PM  Result Value Ref Range   Glucose-Capillary 94 65 - 99 mg/dL  Glucose, capillary     Status: None   Collection Time: 07/25/15  4:05 PM  Result Value Ref Range   Glucose-Capillary 95 65 - 99 mg/dL  Glucose, capillary     Status: None   Collection Time: 07/25/15  8:22 PM  Result Value Ref Range   Glucose-Capillary 98 65 - 99 mg/dL  Magnesium     Status: None   Collection Time: 07/26/15  1:22 AM  Result Value Ref Range   Magnesium 2.0 1.7 - 2.4 mg/dL  Valproic acid level     Status: None   Collection Time: 07/26/15  4:49 AM  Result Value Ref Range   Valproic Acid Lvl 78 50.0 - 100.0 ug/mL  Glucose, capillary     Status: None   Collection Time: 07/26/15  8:00 AM  Result Value Ref Range   Glucose-Capillary 79 65 - 99 mg/dL  Glucose, capillary     Status: None   Collection Time: 07/26/15 11:29 AM  Result Value Ref Range   Glucose-Capillary 82 65 - 99 mg/dL   Comment 1 Notify RN    Comment 2 Document in Chart    No results found.     Medical Problem List and Plan: 1. 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 H/o MS.   2.  DVT  Prophylaxis/Anticoagulation: Pharmaceutical: Heparin 3. Pain Management: Tylenol prn  4. Mood: LCSW to follow for evaluation and support.  5. Neuropsych: This patient is not capable of making decisions on her own behalf. 6. Skin/Wound Care: Routine pressure relief measures 7. Fluids/Electrolytes/Nutrition: Monitor I/O. 1200 cc FR.  Labs with HD.  8. 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   9. HTN: Need to prevent hypotension to prevent recurrent watershed infarcts.   10. ESRD: Has history of orhtostatic changes.   11. OSA: Continue CPAP at bedtime/sleeping.  12. E coli Sepsis: continue Rocephin.  13. Arrthymias:  Husband refusing  Coreg--he hold her BP meds at home if SBP < 180. He has refused multiple doses of coreg. Relayed reported episodes of  asymptomatic VT and need to continue coreg. Will set hold parameter on Cardura.  14. DM type 2: Husband does not want BS covered if under 120.  Will monitor BS ac/hs. BS well controlled off lantus. Will continue SSI for now.  15. COPD: Uses oxygen prn with activity.   Post Admission Physician Evaluation: 1. Functional deficits secondary  to weakness and cognitive deficits after seizures and embolic strokes. 2. Patient is admitted to receive collaborative, interdisciplinary care between the physiatrist, rehab nursing staff, and therapy team. 3. Patient's level of medical complexity and substantial therapy needs in context of that medical necessity cannot be provided at a lesser intensity of care such as a SNF. 4. Patient has experienced substantial functional loss from his/her baseline which was documented above under the "Functional History" and "Functional Status" headings.  Judging by the patient's diagnosis, physical exam, and functional history, the patient has potential for functional progress which will result in measurable gains while on inpatient rehab.  These gains will be of substantial and practical use upon  discharge  in facilitating mobility and self-care at the household level. 5. Physiatrist will provide 24 hour management of medical needs as well as oversight of the therapy plan/treatment and provide guidance as appropriate regarding the interaction of the two. 6. 24 hour rehab nursing will assist with bladder management, bowel management, safety, skin/wound care, disease management, medication administration, pain management and patient education and help integrate therapy concepts, techniques,education, etc. 7. PT will assess and treat for/with: Lower extremity strength, range of motion, stamina, balance, functional mobility, safety, adaptive techniques and equipment, woundcare, coping skills, pain control, stroke education.   Goals are: supervision/Min A. 8. OT will assess and treat for/with: ADL's, functional mobility, safety, upper extremity strength, adaptive techniques and equipment, wound mgt, ego support, stroke education, and community reintegration.   Goals are: supervision/Min A. Therapy may proceed with showering this patient. 9. SLP will assess and treat for/with: cognition and processing.  Goals are: supervision/Min A. 10. Case Management and Social Worker will assess and treat for psychological issues and discharge planning. 11. Team conference will be held weekly to assess progress toward goals and to determine barriers to discharge. 12. Patient will receive at least 3 hours of therapy per day at least 5 days per week. 13. ELOS: 14-20 days.       14. Prognosis:  good and fair  Maryla Morrow, MD  07/26/2015

## 2015-07-27 NOTE — Progress Notes (Signed)
[name] [facility] Admission Coordinator Signed Physical Medicine and Rehabilitation PMR Pre-admission 07/26/2015 5:14 PM  Related encounter: Admission (Discharged) from 07/12/2015 in [facility] 6E MEDICAL/RENAL    Expand All Collapse All   PMR Admission Coordinator Pre-Admission Assessment  Patient: Gail Chapman is an 59 y.o., female MRN: [id number] DOB: [birth year] Height: 5' 1.5" (156.2 cm) Weight: 68.2 kg (150 lb 5.7 oz)  Insurance Information HMO: PPO: yes PCP: IPA: 80/20: OTHER:  PRIMARY: BCBS Policy#: [id number] Subscriber: spouse CM Name: Lubertha South Phone#: [phone] Fax#: [phone] Pre-Cert#: [id number], authorized 07/26/15- 08/01/15 with update due 08/02/15 Employer: disabled Benefits: Phone #: [phone] Name:  Eff. Date: 10/16/10 Deduct: $250 Out of Pocket Max: $1000 Life Max: none CIR: 100% SNF: 100% Outpatient: 100% Co-Pay:  Home Health: 100% Co-Pay:  DME: 100% Co-Pay:  Providers: In network SECONDARy Policy#: Subscriber:  CM Name: Phone#: Fax#:  Pre-Cert#: Employer:  Benefits: Phone #: Name:  Eff. 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 RH-female with history of HTN, DM, diastolic CHF, COPD-oxygen dependent, MS with BLE weakness-wheelchair bound, ESRD-HD MWF who was admitted on 07/12/15 with generalized seizure and transient confusion. CT head negative and EEG done revealing focal sharp wave Left frontal region suggestive of focal disturbance with epileptogenic potential. She was placed on Depakote for seizures per neurology input. 2D echo with EF 50-55% with akinesis of basal-midinferior myocardium, grade 3 diastolic dysfunction moderate MVR and small to moderate pericardial effusion--no tamponade. Cardiology consulted for input and felt that elevated cardiac enzymes likely due to CHF. Blood pressures have been labile and treated with cardene drip. She had decrease in MS with confusion after dialysis on 10/05 and MRI/MRA brain done revealing acute infarcts in bilateral occipital and frontal lobes and chronic hemorrhage in brain stem and left temporal lobes due to HTN. Dr. Roda Shutters recommended ASA for embolic stroke question due to hypotension v/s AFib. She has had persistent lethargy and repeat EEG showed improvement in frontal sharps but continued parieto-oocipital sharp waves therefore Vimpat added for seizure control. She was noted to be febrile with lethargy on 10/12 due to urosepsis and antibiotics narrowed to Rocephin. She is not a candidate for anticoagulation therefore ILR not indicated per cardiology. Patient has had hypotensive episodes correlating to infarct and to have 30 day cardiac event monitoring as outpatient. Husband has been refusing Vimpat therefore this was discontinued by Neuro. Depakote levels therapeutic on 750 mg bid. Mentation and activity tolerance is improving and  therapy has been ongoing. Patient with resultant weakness as well as cognitive deficits affecting mobility. Family and PT requesting CIR for follow up therapy. Pt. With 5 beat run of non sustained vtach this am, husband had refused pt's coreg      Past Medical History  Past Medical History  Diagnosis Date  . Diabetes mellitus without complication (HCC)   . Chronic diastolic CHF (  congestive heart failure) (HCC)     a. 01/2007 MV: No isch/infarct, nl EF; b. 2012 Cath: "normal" per patient. Performed in [place], [place] by Dr. Graciela Husbands.  . Pulmonary hypertension (HCC)     a. on home O2 @ 3lpm 24hrs/day  . HTN (hypertension)     a. Dx in 1993.  Marland Kitchen LVH (left ventricular hypertrophy)   . OSA (obstructive sleep apnea)     a. uses CPAP  . Physical deconditioning   . Renal disorder   . COPD (chronic obstructive pulmonary disease) (HCC)     Family History  family history includes CAD in her mother; Cancer in her father and mother; Diabetes in her father and mother; Heart attack in her father; Hyperlipidemia in her father; Hypertension in her father and mother.  Prior Rehab/Hospitalizations:  Has the patient had major surgery during 100 days prior to admission? No  Current Medications   Current facility-administered medications:  . acetaminophen (TYLENOL) tablet 650 mg, 650 mg, Oral, Q6H PRN, 650 mg at 07/21/15 1746 **OR** acetaminophen (TYLENOL) suppository 650 mg, 650 mg, Rectal, Q6H PRN, Lorretta Harp, MD . amLODipine (NORVASC) tablet 5 mg, 5 mg, Oral, Daily, Beryle Lathe, MD . antiseptic oral rinse (CPC / CETYLPYRIDINIUM CHLORIDE 0.05%) solution 7 mL, 7 mL, Mouth Rinse, BID, Ripudeep K Rai, MD, 7 mL at 07/26/15 1000 . aspirin EC tablet 325 mg, 325 mg, Oral, Daily, Ripudeep K Rai, MD, 325 mg at 07/25/15 1016 . calcitRIOL (ROCALTROL) capsule 0.25 mcg, 0.25 mcg, Oral, Q M,W,F-HD, Jetty Duhamel, NP, 0.25 mcg at 07/23/15 1051 . carvedilol  (COREG) tablet 12.5 mg, 12.5 mg, Oral, BID WC, Saima Rizwan, MD, 12.5 mg at 07/25/15 1015 . cefTRIAXone (ROCEPHIN) 1 g in dextrose 5 % 50 mL IVPB, 1 g, Intravenous, Q24H, Herby Abraham, RPH, 1 g at 07/25/15 1649 . Darbepoetin Alfa (ARANESP) injection 60 mcg, 60 mcg, Intravenous, Q Wed-HD, Jetty Duhamel, NP, 60 mcg at 07/22/15 1200 . divalproex (DEPAKOTE) DR tablet 750 mg, 750 mg, Oral, Q12H, Layne Benton, NP, 750 mg at 07/25/15 1015 . doxazosin (CARDURA) tablet 2 mg, 2 mg, Oral, Daily, Calvert Cantor, MD, 2 mg at 07/25/15 1015 . heparin injection 5,000 Units, 5,000 Units, Subcutaneous, 3 times per day, Lorretta Harp, MD, 5,000 Units at 07/25/15 2206 . insulin aspart (novoLOG) injection 0-9 Units, 0-9 Units, Subcutaneous, TID WC, Lorretta Harp, MD, Stopped at 07/15/15 0800 . insulin glargine (LANTUS) injection 3 Units, 3 Units, Subcutaneous, Daily, Lorretta Harp, MD, 3 Units at 07/15/15 [phone] . LORazepam (ATIVAN) injection 0.5-1 mg, 0.5-1 mg, Intravenous, Q1H PRN, Lorretta Harp, MD . metoprolol (LOPRESSOR) 1 MG/ML injection, , , ,  . metoprolol (LOPRESSOR) injection 5 mg, 5 mg, Intravenous, Q4H PRN, Calvert Cantor, MD, 5 mg at 07/26/15 1541 . ondansetron (ZOFRAN) tablet 4 mg, 4 mg, Oral, Q6H PRN **OR** ondansetron (ZOFRAN) injection 4 mg, 4 mg, Intravenous, Q6H PRN, Lorretta Harp, MD, 4 mg at 07/20/15 2204 . pantoprazole (PROTONIX) EC tablet 40 mg, 40 mg, Oral, Daily, Herby Abraham, RPH, 40 mg at 07/25/15 1015 . pravastatin (PRAVACHOL) tablet 40 mg, 40 mg, Oral, q1800, Herby Abraham, RPH, 40 mg at 07/24/15 1734 . sevelamer carbonate (RENVELA) tablet 800 mg, 800 mg, Oral, TID WC, Ripudeep Jenna Luo, MD, Stopped at 07/15/15 0800 . sodium chloride 0.9 % injection 3 mL, 3 mL, Intravenous, Q12H, Lorretta Harp, MD, 3 mL at 07/26/15 1043  Patients Current Diet: DIET DYS 3 Room service appropriate?: Yes; Fluid consistency:: Thin Diet - low sodium heart  healthy  Precautions / Restrictions Precautions Precautions:  Fall Restrictions Weight Bearing Restrictions: No   Has the patient had 2 or more falls or a fall with injury in the past year?No  Prior Activity Level Community (5-7x/wk): Pt. was out of the home 3-4 times per week for HD, chruch and would ride with husband for short shopping trips (would remain in the car)  Home Assistive Devices / Equipment Home Assistive Devices/Equipment: CBG Meter, Environmental consultant (specify type)  Prior Device Use: Indicate devices/aids used by the patient prior to current illness, exacerbation or injury? Walker  Prior Functional Level Prior Function Level of Independence: Needs assistance Gait / Transfers Assistance Needed: pt walked with walker PTA and used w/c at times due to MS ADL's / Homemaking Assistance Needed: husband cooked and cleaned, bathed pt, dressed pt, helped pt toilet. Pt only got sponge bathes. Comments: Pt not following many commands. Husband answering most questions.  Self Care: Did the patient need help bathing, dressing, using the toilet or eating? Needed some help  Indoor Mobility: Did the patient need assistance with walking from room to room (with or without device)? Needed some help  Stairs: Did the patient need assistance with internal or external stairs (with or without device)? Needed some help  Functional Cognition: Did the patient need help planning regular tasks such as shopping or remembering to take medications? Needed some help  Current Functional Level Cognition  Arousal/Alertness: Lethargic Overall Cognitive Status: Impaired/Different from baseline Current Attention Level: Selective Orientation Level: Oriented X4 Following Commands: Follows one step commands consistently (improved from last week) Safety/Judgement: Decreased awareness of safety, Decreased awareness of deficits General Comments: pt able to keep eyes opened this date and was able to actively participate Attention: Focused Focused Attention: Impaired Focused  Attention Impairment: Verbal basic, Functional basic Memory: Impaired Memory Impairment: Storage deficit, Retrieval deficit Problem Solving: Impaired Problem Solving Impairment: Verbal basic, Functional basic Executive Function: (all impaired due to lower level deficits) Behaviors: Perseveration Safety/Judgment: Impaired   Extremity Assessment (includes Sensation/Coordination)  Upper Extremity Assessment: RUE deficits/detail, LUE deficits/detail, Difficult to assess due to impaired cognition RUE Deficits / Details: moves on command but not through full ROM. Pt cannot hold arm in air when asked but this may be related to attn. RUE Sensation: (difficult to assess.) RUE Coordination: decreased fine motor, decreased gross motor LUE Deficits / Details: see RUE LUE Sensation: (difficult to assess.) LUE Coordination: decreased fine motor, decreased gross motor  Lower Extremity Assessment: Defer to PT evaluation    ADLs  Overall ADL's : Needs assistance/impaired Eating/Feeding: Total assistance, Sitting Eating/Feeding Details (indicate cue type and reason): worked on bringing cup to mouth, husband reports feeding her this morning Grooming: Wash/dry hands, Wash/dry face, Moderate assistance, Bed level Grooming Details (indicate cue type and reason): unable to balance at EOB for grooming, performed at bed level Upper Body Bathing: Total assistance Lower Body Bathing: Total assistance Upper Body Dressing : Total assistance Lower Body Dressing: Total assistance Toilet Transfer: +2 for physical assistance, Total assistance, Stand-pivot, BSC Toilet Transfer Details (indicate cue type and reason): pt not initiating moving own feet during transfers. Toileting- Clothing Manipulation and Hygiene: +2 for physical assistance, Total assistance, Sit to/from stand Functional mobility during ADLs: Total assistance, +2 for physical assistance General ADL Comments: Worked on sitting balance and  trunk strength at EOB, pt with flexed trunk, but responded favorable to faciltation for extension of spine and shoulder retraction. Dependent on one hand on bed or rail for balance while reaching  with the opposite in moderate wide ranges.    Mobility  Overal bed mobility: Needs Assistance Bed Mobility: Supine to Sit Rolling: Max assist Sidelying to sit: Max assist Supine to sit: Mod assist, +2 for physical assistance Sit to supine: Max assist General bed mobility comments: pt up on BSC upon PT arrival    Transfers  Overall transfer level: Needs assistance Equipment used: 2 person hand held assist Transfers: Sit to/from Stand, Stand Pivot Transfers Sit to Stand: Max assist, +2 safety/equipment Stand pivot transfers: Max assist, +2 safety/equipment Squat pivot transfers: Total assist, +2 physical assistance General transfer comment: pt able to push up with UEs, maxA at posterior hips with max directional v/c's to maintain upright position. pt fatigued quickly. pt stood x 2 min with maxA for hygiene    Ambulation / Gait / Stairs / Wheelchair Mobility  Ambulation/Gait Ambulation/Gait assistance: Max assist, +2 physical assistance, +2 safety/equipment (3rd person for chair follow) Ambulation Distance (Feet): 15 Feet Assistive device: Rolling walker (2 wheeled) Gait Pattern/deviations: Decreased step length - right General Gait Details: pt required assist to advance L LE and maintain grip on walker. assist for walker management and to maitnain upright posture. v/c's for sequencing steps Gait velocity: slow    Posture / Balance Dynamic Sitting Balance Sitting balance - Comments: pt requires physical assist as pt with anterior lean and no self correction Balance Overall balance assessment: Needs assistance Sitting-balance support: Feet supported Sitting balance-Leahy Scale: Poor Sitting balance - Comments: pt requires physical assist as pt with anterior lean and no self  correction Postural control: Left lateral lean Standing balance support: Bilateral upper extremity supported Standing balance-Leahy Scale: Poor Standing balance comment: Stood with MAX of 2    Special needs/care consideration BiPAP/CPAP CPAP CPM no Continuous Drip IV no Dialysis no  Life Vest no Oxygen no Special Bed no Trach Size no Wound Vac (area) no  Skin L chest wall HD catheter ; L second toe scab  Bowel mgmt: last BM 07/26/15, continent with assist to [facility] Bladder mgmt: anuric Diabetic mgmt yes     Previous Home Environment Living Arrangements: Spouse/significant other Lives With: Spouse Available Help at Discharge: Family Home Care Services: Yes Type of Home Care Services: Home RT Home Care Agency (if known): [facility]  Discharge Living Setting Plans for Discharge Living Setting: Patient's home Type of Home at Discharge: House Discharge Home Layout: One level Discharge Home Access: Stairs to enter Entrance Stairs-Rails: Right, Left Entrance Stairs-Number of Steps: 5 Discharge Bathroom Shower/Tub: Walk-in shower (husband sponge bathes pt. due to HD cath) Discharge Bathroom Toilet: Standard Discharge Bathroom Accessibility: Yes How Accessible: Accessible via walker Does the patient have any problems obtaining your medications?: No  Social/Family/Support Systems Patient Roles: Spouse Anticipated Caregiver: husband IT consultant, (H) [phone], (C) [phone] Ability/Limitations of Caregiver: pt. lost his job this year, available 24/7 to care for pt. Caregiver Availability: 24/7 Discharge Plan Discussed with Primary Caregiver: Yes Is Caregiver In Agreement with Plan?: Yes Does Caregiver/Family have Issues with Lodging/Transportation while Pt is in Rehab?: No   Goals/Additional Needs Patient/Family Goal for Rehab: supervision and min assist for PT/OT/SLP Expected length of stay: 14-20 days Cultural  Considerations: no Dietary Needs: dysphagia #3, thin liquids Equipment Needs: TBA Pt/Family Agrees to Admission and willing to participate: Yes Program Orientation Provided & Reviewed with Pt/Caregiver Including Roles & Responsibilities: Yes   Decrease burden of Care through IP rehab admission: no   Possible need for SNF placement upon discharge: Not expected  Patient Condition: This patient's medical and functional status has changed since the consult dated: 07/22/15 in which the Rehabilitation Physician determined and documented that the patient's condition is appropriate for intensive rehabilitative care in an inpatient rehabilitation facility. See "History of Present Illness" (above) for medical update. Functional changes [id number] and functional improvements include + 2 max assist transfers and 15' gait with RW . Patient's medical and functional status update has been discussed with the Rehabilitation physician and patient remains appropriate for inpatient rehabilitation. Will admit to inpatient rehab today.  Preadmission Screen Completed By: [name], 07/26/2015 5:31 PM ______________________________________________________________________  Discussed status with Dr. Allena Katz on 07/26/15 at 1730 and received telephone approval for admission today.  Admission Coordinator: [name], time 7846 /Date 07/26/15          Cosigned by: Ankit Karis Juba, MD at 07/26/2015 5:32 PM  Revision History

## 2015-07-27 NOTE — Progress Notes (Signed)
59 y.o. RH-female with history of HTN, DM, diastolic CHF, COPD-oxygen dependent, MS with BLE weakness-wheelchair bound, ESRD-HD MWF who was admitted on 07/12/15 with generalized seizure and transient confusion.  CT head negative and EEG done revealing focal sharp wave Left frontal region suggestive of focal disturbance with epileptogenic potential.  She was placed on Depakote for seizures per neurology input. 2D echo with EF 50-55% with akinesis of basal-midinferior myocardium, grade 3 diastolic dysfunction moderate MVR and small to moderate pericardial effusion--no tamponade. Cardiology consulted for input and felt that elevated cardiac enzymes likely due to CHF. Blood pressures have been labile and treated with cardene drip. She had decrease in mental status with confusion after dialysis on 10/05 and  MRI/MRA brain done revealing acute infarcts in bilateral occipital and frontal lobes and chronic hemorrhage in brain stem and left temporal lobes due to HTN.  Dr. Roda Shutters recommended ASA for embolic stroke question due to hypotension v/s AFib  Subjective/Complaints: Patient slept last night. Using CPAP. Husband at bedside. ROS limited due to cognitionObjective: Vital Signs: Blood pressure 112/48, pulse 69, temperature 99.2 F (37.3 C), temperature source Oral, resp. rate 17, height 5' 1.5" (1.562 m), weight 66.2 kg (145 lb 15.1 oz), SpO2 100 %. No results found. Results for orders placed or performed during the hospital encounter of 07/26/15 (from the past 72 hour(s))  Glucose, capillary     Status: None   Collection Time: 07/27/15  6:45 AM  Result Value Ref Range   Glucose-Capillary 89 65 - 99 mg/dL     HEENT: normal Cardio: RRR and murmur Resp: CTA B/L and unlabored GI: BS positive and NT, ND Extremity:  Pulses positive and No Edema Skin:   Intact Neuro: Lethargic and Abnormal Motor 4 minus/5 bilateral deltoids, biceps, triceps, grip Musc/Skel:  Other no pain with upper extremity or lower  limited range of motion Gen. no acute distress   Assessment/Plan: 1. 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 H/o MS with chronic paraplegia.    which require 3+ hours per day of interdisciplinary therapy in a comprehensive inpatient rehab setting. Physiatrist is providing close team supervision and 24 hour management of active medical problems listed below. Physiatrist and rehab team continue to assess barriers to discharge/monitor patient progress toward functional and medical goals. FIM:       Function - Toileting Toileting activity did not occur: N/A  Function - Archivist transfer activity did not occur: N/A        Function - Comprehension Comprehension: Auditory Comprehension assist level: Follows basic conversation/direction with extra time/assistive device  Function - Expression Expression: Verbal Expression assist level: Expresses basic 75 - 89% of the time/requires cueing 10 - 24% of the time. Needs helper to occlude trach/needs to repeat words.  Function - Social Interaction Social Interaction assist level: Interacts appropriately 90% of the time - Needs monitoring or encouragement for participation or interaction.  Function - Problem Solving Problem solving assist level: Solves basic 90% of the time/requires cueing < 10% of the time  Function - Memory Memory assist level: Recognizes or recalls 75 - 89% of the time/requires cueing 10 - 24% of the time Patient normally able to recall (first 3 days only): Current season, Location of own room, That he or she is in a hospital  Medical Problem List and Plan: 1. 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 H/o MS.    2.  DVT Prophylaxis/Anticoagulation: Pharmaceutical: Heparin 3.  Pain Management: Tylenol prn   4. Mood: LCSW to follow for evaluation and support.   5. Neuropsych: This patient is not capable of  making decisions on her own behalf. 6. Skin/Wound Care: Routine pressure relief measures 7. Fluids/Electrolytes/Nutrition: Monitor I/O. 1200 cc FR.  Labs with HD.   8. 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    9. HTN: Need to prevent hypotension to prevent recurrent watershed infarcts.    10. ESRD: Has history of orhtostatic changes.   11. OSA: Continue CPAP at bedtime/sleeping.   12. E coli Sepsis: continue Rocephin.   13. Arrthymias:  Husband refusing Coreg--he holds her BP meds at home if SBP < 180. He has refused multiple doses of coreg. Relayed reported episodes of  asymptomatic VT and need to continue coreg. Will set hold parameter on Cardura.   14. DM type 2: Husband does not want BS covered if under 120.  Will monitor BS ac/hs. BS well controlled off lantus. Will continue SSI for now.   15. COPD: Uses oxygen prn with activity.   LOS (Days) 1 A FACE TO FACE EVALUATION WAS PERFORMED  KIRSTEINS,ANDREW E 07/27/2015, 8:42 AM

## 2015-07-27 NOTE — Progress Notes (Signed)
Erick Colace, MD Physician Signed Physical Medicine and Rehabilitation Consult Note 07/22/2015 9:51 AM  Related encounter: Admission (Discharged) from 07/12/2015 in [facility] 6E MEDICAL/RENAL    Expand All Collapse All        Physical Medicine and Rehabilitation Consult  Reason for Consult: Weakness and cognitive deficits after seizures and embolic strokes.  Referring Physician: Dr. Roda Shutters   HPI: Gail Chapman is a 59 y.o. RH-female with history of HTN, DM, diastolic CHF, COPD-oxygen dependent, MS with BLE weakness-wheelchair bound, ESRD-HD MWF who was admitted on 07/12/15 with generalized seizure and transient confusion. CT head negative and EEG done revealing focal sharp wave Left frontal region suggestive of focal disturbance with epileptogenic potential. She was placed on Depakote for seizures per neurology input. 2D echo with EF 50-55% with akinesis of basal-midinferior myocardium, grade 3 diastolic dysfunction moderate MVR and small to moderate pericardial effusion--no tamponade. Cardiology consulted for input and felt that elevated cardiac enzymes likely due to CHF. Blood pressures have been labile and treated with cardene drip. She had decrease in MS with confusion after dialysis on 10/05 and MRI/MRA brain done revealing acute infarcts in bilateral occipital and frontal lobes and chronic hemorrhage in brain stem and left temporal lobes due to HTN. Dr. Roda Shutters recommended ASA for embolic stroke question due to hypotension v/s AFib. She has had persistent lethargy and repeat EED showed improvement in frontal sharps but continued parieto-oocipital sharp waves therefore Vimpat added for seizure control. She has had N/V and TEE deferred till symptoms resolved. Zosyn and Daptomycin was added for fevers and patient continues to have bouts of lethargy. For TEE today? Continue to have lethargy and husband has refused dialysis today due to fatigue. Therapy ongoing and patient  with resultant weakness as well as cognitive deficits affecting mobility. Family and PT requesting CIR for follow up therapy.   Husband at bedside. He states that at home she could stand while he helped to wash and dress her. Currently she is unable to stand without assistance.  Review of Systems  Constitutional: Positive for malaise/fatigue.  HENT: Negative for hearing loss.  Eyes: Positive for blurred vision (Poor vision due to MS. ). Negative for pain.  Respiratory: Negative for cough and shortness of breath.  Cardiovascular: Negative for chest pain and palpitations.  Gastrointestinal: Positive for blood in stool. Negative for nausea, vomiting and abdominal pain.  Genitourinary: Negative for dysuria and frequency.  Musculoskeletal: Negative for myalgias and back pain.  Skin: Negative for rash.  Neurological: Positive for weakness. Negative for dizziness, tingling and headaches.  Psychiatric/Behavioral: Positive for memory loss.      Past Medical History  Diagnosis Date  . Diabetes mellitus without complication (HCC)   . Chronic diastolic CHF (congestive heart failure) (HCC)     a. 01/2007 MV: No isch/infarct, nl EF; b. 2012 Cath: "normal" per patient. Performed in [place], [place] by Dr. Graciela Husbands.  . Pulmonary hypertension (HCC)     a. on home O2 @ 3lpm 24hrs/day  . HTN (hypertension)     a. Dx in 1993.  Marland Kitchen LVH (left ventricular hypertrophy)   . OSA (obstructive sleep apnea)     a. uses CPAP  . Physical deconditioning   . Renal disorder   . COPD (chronic obstructive pulmonary disease) [facility])     Past Surgical History  Procedure Laterality Date  . Knee surgery    . Carpal tunnel release    . Tubal ligation    . Cholecystectomy    .  Mass excision      a. under axilla.  . Subxyphoid pericardial window N/A 02/26/2013    Procedure: SUBXYPHOID PERICARDIAL WINDOW; Surgeon: Delight Ovens,  MD; Location: [facility] OR; Service: Thoracic; Laterality: N/A;  . Left and right heart catheterization with coronary angiogram N/A 02/25/2013    Procedure: LEFT AND RIGHT HEART CATHETERIZATION WITH CORONARY ANGIOGRAM; Surgeon: Dolores Patty, MD; Location: [facility] CATH LAB; Service: Cardiovascular; Laterality: N/A;    Family History  Problem Relation Age of Onset  . Diabetes Mother   . CAD Mother   . Cancer Mother   . Hypertension Mother   . Cancer Father   . Diabetes Father   . Hyperlipidemia Father   . Hypertension Father   . Heart attack Father      Social History: Married. 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 Husband at home at this time and her sister has move in to assist. She was to ambulate with walker and required assistance at times. Needed assistance with transfers and ith ADLs. She eports that she has quit smoking 35 years ago--smoked for 2 years. She does not have any smokeless tobacco history on file. She reports that she does not drink alcohol or use illicit drugs.    Allergies  Allergen Reactions  . Clonidine Derivatives     Hand itching  . Hydralazine     Visual disturbances   . Labetalol     Fatigue   . Vancomycin     "shut down my kidneys"    Medications Prior to Admission  Medication Sig Dispense Refill  . aspirin EC 325 MG EC tablet Take 1 tablet (325 mg total) by mouth daily. 30 tablet 3  . carvedilol (COREG) 12.5 MG tablet Take 1 tablet (12.5 mg total) by mouth 2 (two) times daily with a meal. On takes on non dialysis day. 60 tablet 3  . doxazosin (CARDURA) 2 MG tablet Take 1 tablet (2 mg total) by mouth daily. 30 tablet 6  . insulin aspart (NOVOLOG) 100 UNIT/ML injection Inject 10 Units into the skin 3 (three) times daily with meals. Per sliding scale 10 mL 0  . insulin detemir (LEVEMIR) 100 UNIT/ML injection Inject 0.04 mLs (4 Units  total) into the skin at bedtime. Hold if CBG<150 10 mL 0  . glucose blood (FREESTYLE LITE) test strip Use as instructed (Patient not taking: Reported on 05/05/2015) 100 each 12  . promethazine (PHENERGAN) 25 MG suppository Place 1 suppository (25 mg total) rectally at bedtime. (Patient not taking: Reported on 05/05/2015) 12 each 3    Home: Home Living Family/patient expects to be discharged to:: Skilled nursing facility Living Arrangements: Spouse/significant other Available Help at Discharge: Family Lives With: Spouse  Functional History: Prior Function Level of Independence: Needs assistance Gait / Transfers Assistance Needed: pt walked with walker PTA and used w/c at times due to MS ADL's / Homemaking Assistance Needed: husband cooked and cleaned, bathed pt, dressed pt, helped pt toilet. Pt only got sponge bathes. Comments: Pt not following many commands. Husband answering most questions. Functional Status:  Mobility: Bed Mobility Overal bed mobility: Needs Assistance Bed Mobility: Supine to Sit Supine to sit: Max assist, +2 for physical assistance, HOB elevated General bed mobility comments: unable to follow simple one step command to assist in transfer, max directional verbal and tactile cues to complete task Transfers Overall transfer level: Needs assistance Equipment used: (2 person lift with gait belt and bed pad) Transfers: Sit to/from Stand Sit to  Stand: Max assist, +2 physical assistance Stand pivot transfers: Total assist, +2 physical assistance Squat pivot transfers: Total assist, +2 physical assistance General transfer comment: pt didn't initiate transfer, did not advance LEs without max assist Ambulation/Gait General Gait Details: unable    ADL: ADL Overall ADL's : Needs assistance/impaired Eating/Feeding: Total assistance, Sitting Eating/Feeding Details (indicate cue type and reason): no initiation or follow through. Pt will stop task and  needs max cues to keep going with all adls. Grooming: Wash/dry face, Wash/dry hands, Maximal assistance Grooming Details (indicate cue type and reason): started with hand over hand. Pt could continue after hand over hand taken away for 1-3 seconds and then stops. Upper Body Bathing: Total assistance Lower Body Bathing: Total assistance Upper Body Dressing : Total assistance Lower Body Dressing: Total assistance Toilet Transfer: +2 for physical assistance, Total assistance, Stand-pivot, BSC Toilet Transfer Details (indicate cue type and reason): pt not initiating moving own feet during transfers. Toileting- Clothing Manipulation and Hygiene: +2 for physical assistance, Total assistance, Sit to/from stand Functional mobility during ADLs: Total assistance, +2 for physical assistance General ADL Comments: Pt with very poor initation and follow through and not following consistent commands therefore, at this stage, is dependent with most adls. Husband did assist pt with bathing, dressing and toileting PTA.  Cognition: Cognition Overall Cognitive Status: Impaired/Different from baseline Arousal/Alertness: Lethargic Orientation Level: Oriented to person, Oriented to place, Oriented to situation, Oriented to time Attention: Focused Focused Attention: Impaired Focused Attention Impairment: Verbal basic, Functional basic Memory: Impaired Memory Impairment: Storage deficit, Retrieval deficit Problem Solving: Impaired Problem Solving Impairment: Verbal basic, Functional basic Executive Function: (all impaired due to lower level deficits) Behaviors: Perseveration Safety/Judgment: Impaired Cognition Arousal/Alertness: Lethargic Behavior During Therapy: Flat affect Overall Cognitive Status: Impaired/Different from baseline Area of Impairment: Following commands, Safety/judgement, Awareness, Problem solving Orientation Level: Disoriented to, Time Current Attention Level: Focused (due to  lethargy) Memory: Decreased recall of precautions, Decreased short-term memory Following Commands: Follows one step commands inconsistently Safety/Judgement: Decreased awareness of safety, Decreased awareness of deficits Awareness: Intellectual Problem Solving: Slow processing, Decreased initiation, Difficulty sequencing, Requires verbal cues, Requires tactile cues General Comments: pt extremely lethargic so unsure if true cognitive deficits or just lethargic/decreased arousal level   Blood pressure 153/59, pulse 60, temperature 98.1 F (36.7 C), temperature source Oral, resp. rate 18, height 5' 1.5" (1.562 m), weight 66.316 kg (146 lb 3.2 oz), SpO2 98 %. Physical Exam  Constitutional: She is oriented to person, place, and time. She appears well-developed and well-nourished.  Up in chair with tendency to list to the left. Poor postural reflexes.  HENT:  Head: Normocephalic and atraumatic.  Mouth/Throat: Oropharynx is clear and moist.  Eyes: Conjunctivae are normal. Pupils are equal, round, and reactive to light. Right eye exhibits no discharge. Left eye exhibits no discharge.  Neck: Normal range of motion. Neck supple.  Cardiovascular: Normal rate and regular rhythm.  Murmur heard. Respiratory: Effort normal and breath sounds normal. No respiratory distress. She has no wheezes.  GI: Soft. Bowel sounds are normal. She exhibits no distension. There is no tenderness.  Musculoskeletal: She exhibits no edema or tenderness.  Extensor tone BLE tight heel cords.  Neurological: She is alert and oriented to person, place, and time.  Speech clear. She was fatigued by the end of exam and had increased difficulty following commands. She was able to state age, DOB, city. Was unable to other biographical questions appropriately and had perseverative behaviors/answers by the end of exam. She was  unable to follow simple one step motor commands without multiple cues. BLE with spatic paraparesis and  decrease in light touch.  Skin: Skin is warm and dry.  Psychiatric: Her affect is blunt. Her speech is delayed. She is slowed. She exhibits abnormal recent memory and abnormal remote memory.  Motor strength is 4 minus bilateral deltoid, biceps, triceps, grip 3 minus bilateral hip flexors 4 minus bilateral knee extensors 3 minus bilateral ankle dorsiflexors and plantar flexors Speech without evidence of aphasia Attempted to check visual fields however she was not able to cooperate due to poor sustained attention   Lab Results Last 24 Hours    Results for orders placed or performed during the hospital encounter of 07/12/15 (from the past 24 hour(s))  Glucose, capillary Status: None   Collection Time: 07/21/15 11:32 AM  Result Value Ref Range   Glucose-Capillary 88 65 - 99 mg/dL  Glucose, capillary Status: None   Collection Time: 07/21/15 4:57 PM  Result Value Ref Range   Glucose-Capillary 89 65 - 99 mg/dL  Urinalysis, Routine w reflex microscopic (not at [facility]) Status: Abnormal   Collection Time: 07/21/15 11:52 PM  Result Value Ref Range   Color, Urine YELLOW YELLOW   APPearance TURBID (A) CLEAR   Specific Gravity, Urine 1.012 1.005 - 1.030   pH 6.5 5.0 - 8.0   Glucose, UA NEGATIVE NEGATIVE mg/dL   Hgb urine dipstick LARGE (A) NEGATIVE   Bilirubin Urine NEGATIVE NEGATIVE   Ketones, ur 15 (A) NEGATIVE mg/dL   Protein, ur >161 (A) NEGATIVE mg/dL   Urobilinogen, UA 0.2 0.0 - 1.0 mg/dL   Nitrite NEGATIVE NEGATIVE   Leukocytes, UA LARGE (A) NEGATIVE  Urine microscopic-add on Status: Abnormal   Collection Time: 07/21/15 11:52 PM  Result Value Ref Range   WBC, UA TOO NUMEROUS TO COUNT <3 WBC/hpf   RBC / HPF 7-10 <3 RBC/hpf   Bacteria, UA MANY (A) RARE   Urine-Other AMORPHOUS URATES/PHOSPHATES   Basic metabolic panel Status: Abnormal   Collection Time: 07/22/15  6:20 AM  Result Value Ref Range   Sodium 138 135 - 145 mmol/L   Potassium 4.7 3.5 - 5.1 mmol/L   Chloride 98 (L) 101 - 111 mmol/L   CO2 23 22 - 32 mmol/L   Glucose, Bld 67 65 - 99 mg/dL   BUN 48 (H) 6 - 20 mg/dL   Creatinine, Ser 0.96 (H) 0.44 - 1.00 mg/dL   Calcium 9.1 8.9 - 04.5 mg/dL   GFR calc non Af Amer 6 (L) >60 mL/min   GFR calc Af Amer 7 (L) >60 mL/min   Anion gap 17 (H) 5 - 15  CBC Status: Abnormal   Collection Time: 07/22/15 6:20 AM  Result Value Ref Range   WBC 9.6 4.0 - 10.5 K/uL   RBC 3.28 (L) 3.87 - 5.11 MIL/uL   Hemoglobin 9.4 (L) 12.0 - 15.0 g/dL   HCT 40.9 (L) 81.1 - 91.4 %   MCV 86.9 78.0 - 100.0 fL   MCH 28.7 26.0 - 34.0 pg   MCHC 33.0 30.0 - 36.0 g/dL   RDW 78.2 (H) 95.6 - 21.3 %   Platelets 161 150 - 400 K/uL  Glucose, capillary Status: None   Collection Time: 07/22/15 7:53 AM  Result Value Ref Range   Glucose-Capillary 76 65 - 99 mg/dL      Imaging Results (Last 48 hours)    No results found.    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 Does the need for close, 24 hr/day medical supervision in concert with the patient's rehab needs make it unreasonable for this patient to be served in a less intensive setting? Yes 2. Co-Morbidities requiring supervision/potential complications: Hypertension, diabetes, COPD, O2 dependent, end-stage renal disease on dialysis 3. Due to bladder management, bowel management, safety, skin/wound care, disease management, medication administration, pain management and patient education, does the patient require 24 hr/day rehab nursing? Yes 4. Does the patient require coordinated care of a physician, rehab nurse, PT (1-2 hrs/day, 5 days/week), OT (1-2  hrs/day, 5 days/week) and SLP (0.5-1 hrs/day, 5 days/week) to address physical and functional deficits in the context of the above medical diagnosis(es)? Yes Addressing deficits in the following areas: balance, endurance, locomotion, strength, transferring, bowel/bladder control, bathing, dressing, feeding, grooming, toileting, cognition, swallowing and psychosocial support 5. Can the patient actively participate in an intensive therapy program of at least 3 hrs of therapy per day at least 5 days per week? Yes 6. The potential for patient to make measurable gains while on inpatient rehab is good and fair 7. Anticipated functional outcomes upon discharge from inpatient rehab are supervision and min assist with PT, supervision and min assist with OT, supervision and min assist with SLP. 8. Estimated rehab length of stay to reach the above functional goals is: 14-20 days 9. Does the patient have adequate social supports and living environment to accommodate these discharge functional goals? Yes 10. Anticipated D/C setting: Home 11. Anticipated post D/C treatments: HH therapy 12. Overall Rehab/Functional Prognosis: good and fair  RECOMMENDATIONS: This patient's condition is appropriate for continued rehabilitative care in the following setting: CIR Patient has agreed to participate in recommended program. N/A Note that insurance prior authorization may be required for reimbursement for recommended care.  Comment:     07/22/2015       Revision History     Date/Time User Provider Type Action   07/22/2015 5:52 PM Erick Colace, MD Physician Sign   07/22/2015 5:51 PM Erick Colace, MD Physician Share   07/22/2015 5:47 PM Erick Colace, MD Physician Share   07/22/2015 12:14 PM [place] Prudencio Pair, RN Registered Nurse Share   07/22/2015 11:51 AM Jacquelynn Cree, PA-C Physician Assistant Share   View Details Report

## 2015-07-27 NOTE — Progress Notes (Signed)
Received report from 6E 2045. Patient arrived to unit  2310 with husband and belongings at beside.  Vitals stable.  Safety plan and stroke book explained. Questions answered, no complaints at this time.  Bed alarm and call bell in reach. Will continue to monitor.

## 2015-07-28 ENCOUNTER — Inpatient Hospital Stay (HOSPITAL_COMMUNITY): Payer: Medicare Other | Admitting: Physical Therapy

## 2015-07-28 ENCOUNTER — Inpatient Hospital Stay (HOSPITAL_COMMUNITY): Payer: Medicare Other | Admitting: *Deleted

## 2015-07-28 ENCOUNTER — Inpatient Hospital Stay (HOSPITAL_COMMUNITY): Payer: Medicare Other | Admitting: Speech Pathology

## 2015-07-28 LAB — GLUCOSE, CAPILLARY
GLUCOSE-CAPILLARY: 116 mg/dL — AB (ref 65–99)
GLUCOSE-CAPILLARY: 92 mg/dL (ref 65–99)
GLUCOSE-CAPILLARY: 124 mg/dL — AB (ref 65–99)
Glucose-Capillary: 85 mg/dL (ref 65–99)

## 2015-07-28 MED ORDER — SEVELAMER CARBONATE 800 MG PO TABS
800.0000 mg | ORAL_TABLET | Freq: Three times a day (TID) | ORAL | Status: DC
  Administered 2015-07-30 (×2): 800 mg via ORAL
  Filled 2015-07-28 (×6): qty 1

## 2015-07-28 NOTE — IPOC Note (Addendum)
Overall Plan of Care [facility]) Patient Details Name: Gail Chapman MRN: [id number] DOB: [birth year]  Admitting Diagnosis: B CVAs  Hospital Problems: Principal Problem:   Cerebral embolism with cerebral infarction Active Problems:   OSA (obstructive sleep apnea)   Diabetes mellitus with renal complications (HCC)   COPD (chronic obstructive pulmonary disease) (HCC)   Seizures (HCC)   MS (multiple sclerosis) (HCC)   ESRD on dialysis (HCC)   Gait disturbance, post-stroke     Functional Problem List: Nursing Endurance, Medication Management, Motor, Perception, Skin Integrity, Behavior, Bladder, Bowel  PT Balance, Endurance, Motor, Nutrition, Safety, Perception  OT Balance, Cognition, Endurance, Motor, Perception, Sensory, Vision  SLP Cognition  TR         Basic ADL's: OT Eating, Grooming, Bathing, Dressing, Toileting     Advanced  ADL's: OT       Transfers: PT Bed Mobility, Bed to Chair, Car, Oncologist: PT Ambulation, Psychologist, prison and probation services, Stairs     Additional Impairments: OT Fuctional Use of Upper Extremity  SLP Social Cognition   Problem Solving, Memory, Attention, Awareness, Social Interaction  TR      Anticipated Outcomes Item Anticipated Outcome  Self Feeding min A  Swallowing  min assist-supervision    Basic self-care  mod A  Toileting  mod A   Bathroom Transfers mod A to toilet  Bowel/Bladder  Patient to be continent of bowel/bladder.   Transfers  mod A  Locomotion  mod A  Communication     Cognition  Mod  Pain  < 4 on 0-10 pain scale.   Safety/Judgment  Patient to remain free of falls while on Rehab.    Therapy Plan: PT Intensity: Minimum of 1-2 x/day ,45 to 90 minutes PT Frequency: 5 out of 7 days PT Duration Estimated Length of Stay: 14-16 days OT Intensity: Minimum of 1-2 x/day, 45 to 90 minutes OT Frequency: 5 out of 7 days OT Duration/Estimated Length of Stay: 14-16 days SLP Intensity: Minumum of 1-2  x/day, 30 to 90 minutes SLP Frequency: 3 to 5 out of 7 days SLP Duration/Estimated Length of Stay: 14-16 days       Team Interventions: Nursing Interventions Patient/Family Education, Bowel Management, Pain Management, Medication Management, Cognitive Remediation/Compensation, Dysphagia/Aspiration Precaution Training, Discharge Planning, Psychosocial Support, Disease Management/Prevention  PT interventions Ambulation/gait training, Warden/ranger, Cognitive remediation/compensation, Community reintegration, Discharge planning, Disease management/prevention, DME/adaptive equipment instruction, Functional mobility training, Neuromuscular re-education, Patient/family education, Psychosocial support, Stair training, Therapeutic Activities, Therapeutic Exercise, UE/LE Strength taining/ROM, UE/LE Coordination activities, Visual/perceptual remediation/compensation, Wheelchair propulsion/positioning  OT Interventions Warden/ranger, Cognitive remediation/compensation, Discharge planning, DME/adaptive equipment instruction, Functional electrical stimulation, Functional mobility training, Neuromuscular re-education, Patient/family education, Self Care/advanced ADL retraining, Therapeutic Activities, Therapeutic Exercise, UE/LE Strength taining/ROM, UE/LE Coordination activities, Visual/perceptual remediation/compensation  SLP Interventions Cognitive remediation/compensation, Cueing hierarchy, Environmental controls, Functional tasks, Internal/external aids, Patient/family education, Speech/Language facilitation, Therapeutic Activities  TR Interventions    SW/CM Interventions Discharge Planning, Psychosocial Support, Patient/Family Education    Team Discharge Planning: Destination: PT-Home ,OT- Home , SLP-Home Projected Follow-up: PT-Home health PT, 24 hour supervision/assistance, OT-  Home health OT, SLP-24 hour supervision/assistance, Home Health SLP Projected Equipment Needs: PT-To  be determined, OT- To be determined, SLP-None recommended by SLP Equipment Details: PT-pt reports having RW, no wheelchair, OT-  Patient/family involved in discharge planning: PT- Patient unable/family or caregiver not available,  OT-Patient, SLP-Patient, Family member/caregiver  MD ELOS: 14-20 days Medical Rehab Prognosis:  Good Assessment: 59 y.o. RH-female with  history of HTN, DM, diastolic CHF, COPD-oxygen dependent, MS with BLE weakness-wheelchair bound, ESRD-HD MWF who was admitted on 07/12/15 with generalized seizure and transient confusion. CT head negative and EEG done revealing focal sharp wave Left frontal region suggestive of focal disturbance with epileptogenic potential. She was placed on Depakote for seizures per neurology input. 2D echo with EF 50-55% with akinesis of basal-midinferior myocardium, grade 3 diastolic dysfunction moderate MVR and small to moderate pericardial effusion--no tamponade. Cardiology consulted for input and felt that elevated cardiac enzymes likely due to CHF. Blood pressures have been labile and treated with cardene drip. She had decrease in mental status with confusion after dialysis on 10/05 and MRI/MRA brain done revealing acute infarcts in bilateral occipital and frontal lobes and chronic hemorrhage in brain stem and left temporal lobes due to HTN.    See Team Conference Notes for weekly updates to the plan of care

## 2015-07-28 NOTE — Progress Notes (Signed)
Physical Therapy Session Note  Patient Details  Name: Gail Chapman MRN: [id number] Date of Birth: 08-17-1956  Today's Date: 07/28/2015 PT Individual Time: 1300-1430 PT Individual Time Calculation (min): 90 min   Short Term Goals: Week 1:  PT Short Term Goal 1 (Week 1): Pt will sustain attention to functional task x 2 minutes with max multimodal cues.  PT Short Term Goal 2 (Week 1): Patient will transfer bed <> wheelchair with assist of one person.  PT Short Term Goal 3 (Week 1): Patient will initiate gait training.  PT Short Term Goal 4 (Week 1): Patient will propel wheelchair x 50 ft with supervision and max multimodal cues.   Skilled Therapeutic Interventions/Progress Updates:   Patient received in wheelchair with lunch tray set up, mostly uneaten. With encouragement, patient took 2 bites with greatly increased time before reporting not being hungry. Discussed possible need for supervision for initiation/encouragement with meals with SLP and NT. Patient stated she needed to urinate. Utilized Stedy with max-total A x 2 to transfer from wheelchair <> toilet with total A for standing balance while second helper performed clothing management prior to toileting and required assistance of third helper after toileting to stand to pull up pants using Stedy. Patient demonstrates extreme forward flexed posture and lateral lean to L, unable to correct despite max multimodal cues. After 30 minutes, patient was still unable to void therefore transferred back to wheelchair. Performed squat pivot transfers with max A x 2. Sitting edge of mat with BLE supported with supervision and no LOB, performed reaching tasks using BUE with focus on midline orientation, upright head and postural control with max multimodal cues doe to progressive forward flexion slouched posture and lateral lean to R with patient able to correct for brief periods of time (5 sec or less). Instructed patient in wheelchair propulsion with  hand over hand assistance for LUE and max multimodal cues for initiation and attention to task as well as sequencing and technique. Patient demonstrated sustained attention x 10 sec with max multimodal cues. Instructed in [employer] from wheelchair level using BUE with focus on initiation, reaching and functional use BUE, and sustained attention with assist to reach using LUE and assist for anterior weight shift for high buttons using RUE x 3 trials. First and second trial = 7 hits with avg reaction time of 8.57 sec progressed to third trial = 9 hits with avg reaction time of 6.67 sec. Following task, patient spontaneously stated, "I liked the board," otherwise patient with flat affect although increased alertness compared to yesterday and responded to simple biographical questions only. Patient requested to return to bed due to fatigue, performed squat pivot transfer and sit > supine with +2A. Patient left semi reclined in bed with all needs within reach.   Therapy Documentation Precautions:  Precautions Precautions: Fall Precaution Comments: hx of MS with baseline visual changes Restrictions Weight Bearing Restrictions: No Pain: Pain Assessment Pain Assessment: No/denies pain   See Function Navigator for Current Functional Status.   Therapy/Group: Individual Therapy  Kerney Elbe 07/28/2015, 3:05 PM

## 2015-07-28 NOTE — Progress Notes (Signed)
Speech Language Pathology Daily Session Note  Patient Details  Name: Gail Chapman MRN: [id number] Date of Birth: 09/07/1956  Today's Date: 07/28/2015 SLP Individual Time: 1035-1130 SLP Individual Time Calculation (min): 55 min  Short Term Goals: Week 1: SLP Short Term Goal 1 (Week 1): Pt will sustain attention to functional task for 2 minutes with Max cues SLP Short Term Goal 2 (Week 1): Pt will utilize external memory aids to facilitate recall of new and daily information with Max cues SLP Short Term Goal 3 (Week 1): Pt will verbalize at least 1 physical and 2 cognitive deficits with Max cues SLP Short Term Goal 4 (Week 1): Pt will complete basic problem solving tasks with 80% accuracy with Max cues SLP Short Term Goal 5 (Week 1): Pt will initiate functional tasks with no more than 2 verbal cues in 50% of opportunities  Skilled Therapeutic Interventions: Skilled treatment session focused on addressing cognitive goals. Patient Mod I with orientation to date, place and situation with use of external aids.  SLP facilitated session by providing Max assist multimodal cues for intellectual awareness of deficits and call bell problem solving; patient stated "my cognition has changed," but was unable to give and example or elaborate.  SLP also facilitated session with Max assist verbal and visual cues to sustain attention to and initiate a basic coin sorting task; patient able to sustain attention for brief 30-45 second periods of time.  Given degree of difficulty with initiation care plan goals were modified to include this deficit.     Function:  Cognition Comprehension Comprehension assist level: Understands basic 25 - 49% of the time/ requires cueing 50 - 75% of the time  Expression   Expression assist level: Expresses basic 25 - 49% of the time/requires cueing 50 - 75% of the time. Uses single words/gestures.  Social Interaction Social Interaction assist level: Interacts appropriately 25 -  49% of time - Needs frequent redirection.  Problem Solving Problem solving assist level: Solves basic less than 25% of the time - needs direction nearly all the time or does not effectively solve problems and may need a restraint for safety  Memory Memory assist level: Recognizes or recalls 25 - 49% of the time/requires cueing 50 - 75% of the time    Pain Pain Assessment Pain Assessment: No/denies pain  Therapy/Group: Individual Therapy  Charlane Ferretti., CCC-SLP [phone]  Deirdra Heumann 07/28/2015, 12:44 PM

## 2015-07-28 NOTE — Progress Notes (Signed)
Occupational Therapy Session Note  Patient Details  Name: Gail Chapman MRN: [id number] Date of Birth: [birth year]  Today's Date: 07/28/2015 OT Individual Time: 0900-1000 OT Individual Time Calculation (min): 60 min    Short Term Goals: Week 1:  OT Short Term Goal 1 (Week 1): Pt will wash UB with min A. OT Short Term Goal 2 (Week 1): Pt will don shirt with max A.  OT Short Term Goal 3 (Week 1): Pt will transfer to toilet with squat pivot of max A x1.  OT Short Term Goal 4 (Week 1): Pt will actively use left arm to wash R arm with min A. OT Short Term Goal 5 (Week 1): Pt will find items on her left side with mod cues.  Skilled Therapeutic Interventions/Progress Updates:    Pt resting in bed upon arrival with husband present.  Pt's husband did not interact with therapist and exited room at beginning of session.  Pt engaged in BADL retraining including bathing and dressing with sit<>stand from w/c at sink.  Pt required max A for squat pivot transfer to w/c and use of Stedy with +2 assistance for sit<>stand for LB bathing and dressing tasks.  Pt noted with delayed task initiation for all tasks.  Pt required tot A for UB bathing tasks and was dependent for LB dressing tasks.  Pt did not initiate assisting with sit<>stand in [place].  Focus on task initiation, attention to task, sit<>stand, and activity tolerance.  Therapy Documentation Precautions:  Precautions Precautions: Fall Precaution Comments: hx of MS with baseline visual changes Restrictions Weight Bearing Restrictions: No Pain: Pain Assessment Pain Assessment: No/denies pain  See Function Navigator for Current Functional Status.   Therapy/Group: Individual Therapy  Rich Brave 07/28/2015, 3:15 PM

## 2015-07-28 NOTE — Progress Notes (Signed)
[facility] Progress Note  Subjective/Complaints: Patient slept well over night.  Using CPAP. Husband at bedside. She does not have any complaints this AM.  ROS limited due to cognition.  However, denies CP, SOB, n/v/d.   Objective: Vital Signs: Blood pressure 180/60, pulse 73, temperature 98.6 F (37 C), temperature source Oral, resp. rate 16, height 5' 1.5" (1.562 m), weight 63.9 kg (140 lb 14 oz), SpO2 100 %. No results found. Results for orders placed or performed during the hospital encounter of 07/26/15 (from the past 72 hour(s))  Glucose, capillary     Status: None   Collection Time: 07/27/15  6:45 AM  Result Value Ref Range   Glucose-Capillary 89 65 - 99 mg/dL  Glucose, capillary     Status: None   Collection Time: 07/27/15 11:46 AM  Result Value Ref Range   Glucose-Capillary 89 65 - 99 mg/dL  Glucose, capillary     Status: Abnormal   Collection Time: 07/27/15  5:02 PM  Result Value Ref Range   Glucose-Capillary 122 (H) 65 - 99 mg/dL  Glucose, capillary     Status: Abnormal   Collection Time: 07/27/15  9:11 PM  Result Value Ref Range   Glucose-Capillary 115 (H) 65 - 99 mg/dL  Glucose, capillary     Status: None   Collection Time: 07/28/15  6:57 AM  Result Value Ref Range   Glucose-Capillary 85 65 - 99 mg/dL     Gen: NAD.  VS reviewed.  HEENT: Normocephalic, atraumatic Eyes: Conj WNL, EOMI Cardio: RRR and murmur Resp: CTA B/L and unlabored GI: BS positive and NT, ND Skin:   Intact. Warm and Dry. MSK:   Strength B/l UE 4/5 grossly LLE: Hip flexor 3-/5, ankle dorsi/plantarflexion grossly RLE 4-/5 grossly  Neurological: She is alert.  Patient disoriented. Lacks insight and awareness of deficits. Able to follow simple motor commands without difficulty.  Skin: Skin is warm and dry. No rash noted. She is not diaphoretic. No erythema.  Psychiatric: Her affect is blunt. Her speech is delayed. She is slowed. Cognition and memory are  impaired.    Assessment/Plan: 1. 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 H/o MS with chronic paraplegia.    which require 3+ hours per day of interdisciplinary therapy in a comprehensive inpatient rehab setting. Physiatrist is providing close team supervision and 24 hour management of active medical problems listed below. Physiatrist and rehab team continue to assess barriers to discharge/monitor patient progress toward functional and medical goals. FIM: Function - Bathing Position: Wheelchair/chair at sink Body parts bathed by patient: Chest, Abdomen Body parts bathed by helper: Right arm, Left arm, Front perineal area, Buttocks, Right upper leg, Left upper leg, Right lower leg, Left lower leg, Back  Function- Upper Body Dressing/Undressing What is the patient wearing?: Pull over shirt/dress Pull over shirt/dress - Perfomed by helper: Thread/unthread right sleeve, Thread/unthread left sleeve, Put head through opening, Pull shirt over trunk Function - Lower Body Dressing/Undressing What is the patient wearing?: Pants, Underwear, Non-skid slipper socks (pull up depend brief) Position: Wheelchair/chair at sink Underwear - Performed by helper: Thread/unthread right underwear leg, Thread/unthread left underwear leg, Pull underwear up/down Pants- Performed by helper: Thread/unthread right pants leg, Thread/unthread left pants leg, Pull pants up/down, Fasten/unfasten pants Non-skid slipper socks- Performed by helper: Don/doff right sock, Don/doff left sock Assist for lower body dressing: 2 Helpers  Function - Toileting Toileting activity did not occur: N/A Toileting steps completed by helper: Adjust  clothing prior to toileting, Performs perineal hygiene, Adjust clothing after toileting Assist level: Two helpers  Function - Archivist transfer activity did not occur: N/A Toilet transfer assistive device: Mechanical lift Mechanical  lift: Stedy Assist level to toilet: 2 helpers Assist level from toilet: 2 helpers  Function - Chair/bed transfer Chair/bed transfer method: Squat pivot Chair/bed transfer assist level: 2 helpers Chair/bed transfer details: Tactile cues for initiation, Tactile cues for sequencing, Tactile cues for posture, Tactile cues for placement, Tactile cues for weight bearing, Verbal cues for technique, Verbal cues for sequencing, Verbal cues for precautions/safety, Verbal cues for safe use of DME/AE, Manual facilitation for weight shifting, Manual facilitation for placement, Manual facilitation for weight bearing  Function - Locomotion: Wheelchair Will patient use wheelchair at discharge?: Yes Type: Manual Max wheelchair distance: 5 ft Assist Level: Supervision or verbal cues Wheel 50 feet with 2 turns activity did not occur: Safety/medical concerns Wheel 150 feet activity did not occur: Safety/medical concerns Turns around,maneuvers to table,bed, and toilet,negotiates 3% grade,maneuvers on rugs and over doorsills: No Function - Locomotion: Ambulation Ambulation activity did not occur: Safety/medical concerns (lethargic, unable to maintain static standing using RW > 5 sec with max A x 2 before sitting) Walk 10 feet activity did not occur: Safety/medical concerns Walk 50 feet with 2 turns activity did not occur: Safety/medical concerns Walk 150 feet activity did not occur: Safety/medical concerns Walk 10 feet on uneven surfaces activity did not occur: Safety/medical concerns  Function - Comprehension Comprehension: Auditory Comprehension assist level: Understands basic less than 25% of the time/ requires cueing >75% of the time  Function - Expression Expression: Verbal Expression assist level: Expresses basic 25 - 49% of the time/requires cueing 50 - 75% of the time. Uses single words/gestures.  Function - Social Interaction Social Interaction assist level: Interacts appropriately 50 - 74% of  the time - May be physically or verbally inappropriate.  Function - Problem Solving Problem solving assist level: Solves basic less than 25% of the time - needs direction nearly all the time or does not effectively solve problems and may need a restraint for safety  Function - Memory Memory assist level: Recognizes or recalls 25 - 49% of the time/requires cueing 50 - 75% of the time Patient normally able to recall (first 3 days only): That he or she is in a hospital, Current season  Medical Problem List and Plan: 1. 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 H/o MS.    2.  DVT Prophylaxis/Anticoagulation: Pharmaceutical: Heparin 3. Pain Management: Tylenol prn   4. Mood: LCSW to follow for evaluation and support.   5. Neuropsych: This patient is not capable of making decisions on her own behalf. 6. Skin/Wound Care: Routine pressure relief measures 7. Fluids/Electrolytes/Nutrition: Monitor I/O. 1200 cc FR.  Labs with HD.   8. 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    9. HTN: Need to prevent hypotension to prevent recurrent watershed infarcts.  Labile pressures, will cont to monitor and educate pt's husband.   10. ESRD: Has history of orhtostatic changes.   11. OSA: Continue CPAP at bedtime/sleeping.   12. E coli Sepsis: continue Rocephin.   13. Arrthymias:  Husband refusing Coreg--he holds her BP meds at home if SBP < 180. He has refused multiple doses of coreg. Relayed reported episodes of  asymptomatic VT and need to continue coreg. Will set hold parameter on Cardura.   14. DM type 2: Husband  does not want BS covered if under 120.  Will monitor BS ac/hs. BS well controlled off lantus. Will continue SSI for now.  Fasting FSBG 85 this AM. 15. COPD: Uses oxygen prn with activity.   LOS (Days) 2 A FACE TO FACE EVALUATION WAS PERFORMED  Ankit Karis Juba 07/28/2015, 10:18 AM

## 2015-07-29 ENCOUNTER — Ambulatory Visit (HOSPITAL_COMMUNITY): Payer: BLUE CROSS/BLUE SHIELD | Admitting: Speech Pathology

## 2015-07-29 ENCOUNTER — Inpatient Hospital Stay (HOSPITAL_COMMUNITY): Payer: Medicare Other | Admitting: Physical Therapy

## 2015-07-29 ENCOUNTER — Inpatient Hospital Stay (HOSPITAL_COMMUNITY): Payer: Medicare Other

## 2015-07-29 LAB — RENAL FUNCTION PANEL
ALBUMIN: 2.5 g/dL — AB (ref 3.5–5.0)
ANION GAP: 15 (ref 5–15)
BUN: 62 mg/dL — ABNORMAL HIGH (ref 6–20)
CALCIUM: 8.3 mg/dL — AB (ref 8.9–10.3)
CO2: 23 mmol/L (ref 22–32)
Chloride: 96 mmol/L — ABNORMAL LOW (ref 101–111)
Creatinine, Ser: 7.32 mg/dL — ABNORMAL HIGH (ref 0.44–1.00)
GFR calc non Af Amer: 6 mL/min — ABNORMAL LOW (ref >60)
GFR, EST AFRICAN AMERICAN: 6 mL/min — AB (ref >60)
Glucose, Bld: 96 mg/dL (ref 65–99)
PHOSPHORUS: 8.4 mg/dL — AB (ref 2.5–4.6)
Potassium: 4.1 mmol/L (ref 3.5–5.1)
SODIUM: 134 mmol/L — AB (ref 135–145)

## 2015-07-29 LAB — GLUCOSE, CAPILLARY
GLUCOSE-CAPILLARY: 134 mg/dL — AB (ref 65–99)
Glucose-Capillary: 87 mg/dL (ref 65–99)
Glucose-Capillary: 83 mg/dL (ref 65–99)

## 2015-07-29 MED ORDER — DARBEPOETIN ALFA 60 MCG/0.3ML IJ SOSY
60.0000 ug | PREFILLED_SYRINGE | INTRAMUSCULAR | Status: DC
Start: 2015-08-04 — End: 2015-08-07
  Administered 2015-08-04: 60 ug via INTRAVENOUS
  Filled 2015-07-29: qty 0.3

## 2015-07-29 MED ORDER — DARBEPOETIN ALFA 60 MCG/0.3ML IJ SOSY
60.0000 ug | PREFILLED_SYRINGE | Freq: Once | INTRAMUSCULAR | Status: AC
Start: 2015-07-29 — End: 2015-07-29
  Administered 2015-07-29: 60 ug via INTRAVENOUS
  Filled 2015-07-29: qty 0.3

## 2015-07-29 MED ORDER — DARBEPOETIN ALFA 60 MCG/0.3ML IJ SOSY
PREFILLED_SYRINGE | INTRAMUSCULAR | Status: AC
  Administered 2015-07-29: 60 ug via INTRAVENOUS
  Filled 2015-07-29: qty 0.3

## 2015-07-29 MED ORDER — CALCITRIOL 0.25 MCG PO CAPS
0.2500 ug | ORAL_CAPSULE | ORAL | Status: DC
  Administered 2015-07-29 – 2015-08-06 (×5): 0.25 ug via ORAL
  Filled 2015-07-29 (×5): qty 1

## 2015-07-29 MED ORDER — CALCITRIOL 0.25 MCG PO CAPS
ORAL_CAPSULE | ORAL | Status: AC
  Filled 2015-07-29: qty 1

## 2015-07-29 NOTE — Progress Notes (Signed)
Physical Therapy Session Note  Patient Details  Name: Gail Chapman MRN: [id number] Date of Birth: 24-Apr-1956  Today's Date: 07/29/2015 PT Individual Time: 1300-1400 PT Individual Time Calculation (min): 60 min   Short Term Goals: Week 1:  PT Short Term Goal 1 (Week 1): Pt will sustain attention to functional task x 2 minutes with max multimodal cues.  PT Short Term Goal 2 (Week 1): Patient will transfer bed <> wheelchair with assist of one person.  PT Short Term Goal 3 (Week 1): Patient will initiate gait training.  PT Short Term Goal 4 (Week 1): Patient will propel wheelchair x 50 ft with supervision and max multimodal cues.   Skilled Therapeutic Interventions/Progress Updates:   Session focused on initiation, functional mobility training, standing, and activity tolerance. Patient received in wheelchair attempting to put straw in drink. Able to push straw through with encouragement and cues to initiate task in order to take drink before leaving room. Patient instructed in propelling wheelchair using BUE 2 x 25 ft at very slow pace with supervision and max multimodal cues for initiation and sustained attention to task up to 30-45 sec at a time. Performed squat pivot transfers wheelchair <> mat table with max A and max multimodal cues for sequencing. Performed sit <> stand x 2 from mat using RW with max A x 2, patient demonstrating forward flexed trunk, L knee flexed, and R knee locked out in hyperextension. Utilized standing frame for improved BLE positioning, upright posture, and trunk/hip extension to neutral x 8 min while participating in game of tic tac toe on mirror with cues for strategy and attending to R side of board. Patient reporting fatigue and requested to return to bed. Patient required max A x 2 for squat pivot transfer back to bed and max A for sit > supine, +2A to reposition in bed. Patient left semi reclined in bed with all needs within reach and bed alarm on. Patient more alert  compared to previous sessions and demonstrated improved initiation and attention until she fatigued at end of session.   Therapy Documentation Precautions:  Precautions Precautions: Fall Precaution Comments: hx of MS with baseline visual changes Restrictions Weight Bearing Restrictions: No Pain: Pain Assessment Pain Assessment: No/denies pain   See Function Navigator for Current Functional Status.   Therapy/Group: Individual Therapy  Kerney Elbe 07/29/2015, 2:01 PM

## 2015-07-29 NOTE — Progress Notes (Signed)
Occupational Therapy Session Note  Patient Details  Name: Gail Chapman MRN: [id number] Date of Birth: [birth year]  Today's Date: 07/29/2015 OT Individual Time: 1100-1200 OT Individual Time Calculation (min): 60 min    Short Term Goals: Week 1:  OT Short Term Goal 1 (Week 1): Pt will wash UB with min A. OT Short Term Goal 2 (Week 1): Pt will don shirt with max A.  OT Short Term Goal 3 (Week 1): Pt will transfer to toilet with squat pivot of max A x1.  OT Short Term Goal 4 (Week 1): Pt will actively use left arm to wash R arm with min A. OT Short Term Goal 5 (Week 1): Pt will find items on her left side with mod cues.  Skilled Therapeutic Interventions/Progress Updates:    Pt resting in w/c upon arrival and requested use of toilet.  Pt performed stand pivot transfer from w/c to toilet using grabs bars with mod A.  Pt required tot A for all toileting tasks.  Pt required max A for sit<>stand and SPT transfer to w/c when completed with toileting.  Pt required max A for standing to facilitate therapist pulling up pants.  Pt completed grooming tasks seated at sink.  Pt required 5 mins to wash face and 15 mins to don pullover shirt.  Pt placed on evening bath schedule to facilitate focus on dressing tasks.  Pt required max verbal cues for task initiation and sequencing.  Pt noted with delay in processing requests and initiating tasks.  Focus on functional transfers, activity tolerance, task initiation, sequencing, and safety awareness.  Therapy Documentation Precautions:  Precautions Precautions: Fall Precaution Comments: hx of MS with baseline visual changes Restrictions Weight Bearing Restrictions: No  Pain: Pain Assessment Pain Assessment: No/denies pain  See Function Navigator for Current Functional Status.   Therapy/Group: Individual Therapy  Rich Brave 07/29/2015, 12:07 PM

## 2015-07-29 NOTE — Care Management Note (Signed)
[facility] Individual Statement of Services  Patient Name:  Gail Chapman  Date:  07/29/2015  Welcome to the [facility].  Our goal is to provide you with an individualized program based on your diagnosis and situation, designed to meet your specific needs.  With this comprehensive rehabilitation program, you will be expected to participate in at least 3 hours of rehabilitation therapies Monday-Friday, with modified therapy programming on the weekends.  Your rehabilitation program will include the following services:  Physical Therapy (PT), Occupational Therapy (OT), Speech Therapy (ST), 24 hour per day rehabilitation nursing, Therapeutic Recreaction (TR), Neuropsychology, Case Management (Social Worker), Rehabilitation Medicine, Nutrition Services and Pharmacy Services  Weekly team conferences will be held on Tuesdays to discuss your progress.  Your Social Worker will talk with you frequently to get your input and to update you on team discussions.  Team conferences with you and your family in attendance may also be held.  Expected length of stay: 14-16 days  Overall anticipated outcome: moderate assistance  Depending on your progress and recovery, your program may change. Your Social Worker will coordinate services and will keep you informed of any changes. Your Social Worker's name and contact numbers are listed  below.  The following services may also be recommended but are not provided by the [facility]:   Driving Evaluations  Home Health Rehabiltiation Services  Outpatient Rehabilitation Services    Arrangements will be made to provide these services after discharge if needed.  Arrangements include referral to agencies that provide these services.  Your insurance has been verified to be:  [employer] Your primary doctor is: [facility] as primary care  Pertinent information will be shared with your doctor and your  insurance company.  Social Worker:  [place], [place] [phone] or [phone]   Information discussed with and copy given to patient by: Amada Jupiter, 07/29/2015, 10:10 AM

## 2015-07-29 NOTE — Progress Notes (Addendum)
Speech Language Pathology Daily Session Note  Patient Details  Name: Gail Chapman MRN: [id number] Date of Birth: 07/14/1956  Today's Date: 07/29/2015 SLP Individual Time: 0803-0900 SLP Individual Time Calculation (min): 57 min  Short Term Goals: Week 1: SLP Short Term Goal 1 (Week 1): Pt will sustain attention to functional task for 2 minutes with Max cues SLP Short Term Goal 2 (Week 1): Pt will utilize external memory aids to facilitate recall of new and daily information with Max cues SLP Short Term Goal 3 (Week 1): Pt will verbalize at least 1 physical and 2 cognitive deficits with Max cues SLP Short Term Goal 4 (Week 1): Pt will complete basic problem solving tasks with 80% accuracy with Max cues SLP Short Term Goal 5 (Week 1): Pt will initiate functional tasks with no more than 2 verbal cues in 50% of opportunities SLP Short Term Goal 6 (Week 1): Pt will consume her currenty prescribed diet with min verbal cues for use of swallowing precautions and minimal overt s/s of aspiration.   Skilled Therapeutic Interventions:  Pt was seen for skilled ST targeting cognitive goals and diagnostic treatment of dysphagia per reports that pt had been having difficulty initiating self feeding.  Pt was transferred to wheelchair with +2 assist from nursing to maximize attention and alertness during structured therapeutic tasks.  Pt required max multimodal cues to hand over hand assist to initiate self feeding.  Pt also benefited from max verbal cues to attend to bolus as she was noted with significantly prolonged mastication of soft boluses in the absence of pronounced oral motor weakness.  Pt consumed dys 3 textures and thin liquids with mod-max verbal cues for rate and portion control.  With larger boluses, pt demonstrated immediate reflex cough with liquids, delayed throat clear with solids.  No overt s/s of aspiration evident with skilled interventions for use of smaller bites/sips.  Recommend that pt  remain on dys 3 textures and thin liquids; however, pt needs full supervision during meals due to cognitive deficits.  Will update treatment plan accordingly to include dysphagia goals.  Continue per current plan of care.    Function:  Eating Eating   Modified Consistency Diet: Yes Eating Assist Level: Helper scoops food on utensil;Helper brings food to mouth;Help managing cup/glass;Help with picking up utensils;Helper checks for pocketed food;Hand over hand assist     Helper Scoops Food on Utensil: Occasionally Helper Brings Food to Mouth: Occasionally   Cognition Comprehension Comprehension assist level: Understands basic 25 - 49% of the time/ requires cueing 50 - 75% of the time  Expression   Expression assist level: Expresses basic 50 - 74% of the time/requires cueing 25 - 49% of the time. Needs to repeat parts of sentences.  Social Interaction Social Interaction assist level: Interacts appropriately 25 - 49% of time - Needs frequent redirection.  Problem Solving Problem solving assist level: Solves basic 25 - 49% of the time - needs direction more than half the time to initiate, plan or complete simple activities  Memory Memory assist level: Recognizes or recalls 25 - 49% of the time/requires cueing 50 - 75% of the time    Pain Pain Assessment Pain Assessment: No/denies pain  Therapy/Group: Individual Therapy  Ollie Delano, Melanee Spry 07/29/2015, 12:18 PM

## 2015-07-29 NOTE — Progress Notes (Signed)
Seizures & bilat acute CVA's - on depakote and vimpat, EEG + but showed some improvement on 10/10. New acute scattered CVA's by MRI. Cardiology suspects intermittent afib. l 1. ESRD - MWF [facility]. HD- Below dry wt. Need to limit fluid removal due to severe LVH and sensitive BP with UF. Dialysis Orders: MWF [facility] 4 hrs 70.5kgs (new EDW 65.5kg) 2K/2.5CA L IJ Cath (no perm access) Heparin none Micera 75q 2 weeks 3. Hypertension/volume .  4. Vascular access - using PC, no permanent access. Will defer to her primary nephrologist in [place].  Subjective: Interval History: Off schedule for HD since transferred to Rehab.  Objective: Vital signs in last 24 hours: Temp:  [98 F (36.7 C)-98.4 F (36.9 C)] 98.4 F (36.9 C) (10/20 0539) Pulse Rate:  [66-69] 66 (10/20 0539) Resp:  [16-18] 18 (10/20 0539) BP: (153-174)/(54-65) 153/54 mmHg (10/20 0539) SpO2:  [96 %-100 %] 100 % (10/20 0539) Weight:  [65 kg (143 lb 4.8 oz)] 65 kg (143 lb 4.8 oz) (10/20 0539) Weight change: 1.101 kg (2 lb 6.8 oz)  Intake/Output from previous day: 10/19 0701 - 10/20 0700 In: 180 [P.O.:180] Out: -  Intake/Output this shift:    General appearance: alert and cooperative Back: negative, symmetric, no curvature. ROM normal. No CVA tenderness. Resp: clear to auscultation bilaterally Cardio: regular rate and rhythm, S1, S2 normal, no murmur, click, rub or gallop Extremities: edema tr  Lab Results:  Recent Labs  07/26/15 2225  WBC 12.9*  HGB 10.9*  HCT 34.5*  PLT 143*   BMET:  Recent Labs  07/26/15 2225  NA 132*  K 3.3*  CL 93*  CO2 25  GLUCOSE 144*  BUN 16  CREATININE 3.21*  CALCIUM 8.2*   No results for input(s): PTH in the last 72 hours. Iron Studies: No results for input(s): IRON, TIBC, TRANSFERRIN, FERRITIN in the last 72 hours. Studies/Results: No results found.  Scheduled: . antiseptic oral rinse  7 mL Mouth Rinse BID  . aspirin EC  325 mg Oral Daily  . calcitRIOL   0.25 mcg Oral Q M,W,F-HD  . carvedilol  12.5 mg Oral BID WC  . cefTRIAXone (ROCEPHIN)  IV  1 g Intravenous Q24H  . darbepoetin (ARANESP) injection - DIALYSIS  60 mcg Intravenous Q Wed-HD  . divalproex  750 mg Oral Q12H  . doxazosin  2 mg Oral QHS  . heparin  5,000 Units Subcutaneous 3 times per day  . insulin aspart  0-9 Units Subcutaneous TID WC  . pantoprazole  40 mg Oral QHS  . pravastatin  40 mg Oral q1800  . sevelamer carbonate  800 mg Oral TID WC    LOS: 3 days   Jackolyn Geron C 07/29/2015,12:54 PM

## 2015-07-29 NOTE — Progress Notes (Signed)
Pt and pt's husband continue to refuse Renvela as ordered. Pt has occasional c/o nausea.  Marissa Nestle PAC aware.

## 2015-07-29 NOTE — Progress Notes (Signed)
Social Work  Social Work Assessment and Plan  Patient Details  Name: Gail Chapman MRN: [id number] Date of Birth: [birth year]  Today's Date: 07/29/2015  Problem List:  Patient Active Problem List   Diagnosis Date Noted  . Gait disturbance, post-stroke 07/22/2015  . Stroke with cerebral ischemia (HCC)   . Cerebral embolism with cerebral infarction 07/16/2015  . Cerebral thrombosis with cerebral infarction 07/16/2015  . Seizures (HCC) 07/13/2015  . Elevated troponin 07/13/2015  . MS (multiple sclerosis) (HCC) 07/13/2015  . ESRD on dialysis (HCC) 07/13/2015  . Acute on chronic diastolic heart failure (HCC) 07/13/2015  . COPD (chronic obstructive pulmonary disease) (HCC)   . Acute encephalopathy 07/12/2015  . Pericardial effusion 05/05/2015  . ESRD (end stage renal disease) (HCC) 11/24/2013  . Chronic kidney disease, stage 3 08/01/2013  . Pre-operative cardiovascular examination 06/11/2013  . Altered mental status 02/26/2013  . Acute on chronic renal failure (HCC) 02/24/2013  . Hypertensive crisis 02/19/2013  . Chronic renal failure 02/19/2013  . Acute respiratory failure (HCC) 02/19/2013  . Physical deconditioning   . OSA (obstructive sleep apnea)   . Hypertensive emergency   . Sleep apnea   . Pulmonary hypertension (HCC)   . Chronic diastolic CHF (congestive heart failure) (HCC)   . Diabetes mellitus with renal complications [facility])    Past Medical History:  Past Medical History  Diagnosis Date  . Diabetes mellitus without complication (HCC)   . Chronic diastolic CHF (congestive heart failure) (HCC)     a. 01/2007 MV: No isch/infarct, nl EF;  b. 2012 Cath: "normal" per patient.  Performed in [place], [place] by Dr. Graciela Husbands.  . Pulmonary hypertension (HCC)     a. on home O2 @ 3lpm 24hrs/day  . HTN (hypertension)     a. Dx in 1993.  Marland Kitchen LVH (left ventricular hypertrophy)   . OSA (obstructive sleep apnea)     a. uses CPAP  . Physical deconditioning   . Renal disorder   . COPD  (chronic obstructive pulmonary disease) (HCC)    Past Surgical History:  Past Surgical History  Procedure Laterality Date  . Knee surgery    . Carpal tunnel release    . Tubal ligation    . Cholecystectomy    . Mass excision      a. under axilla.  . Subxyphoid pericardial window N/A 02/26/2013    Procedure: SUBXYPHOID PERICARDIAL WINDOW;  Surgeon: Delight Ovens, MD;  Location: [facility] OR;  Service: Thoracic;  Laterality: N/A;  . Left and right heart catheterization with coronary angiogram N/A 02/25/2013    Procedure: LEFT AND RIGHT HEART CATHETERIZATION WITH CORONARY ANGIOGRAM;  Surgeon: Dolores Patty, MD;  Location: [facility] CATH LAB;  Service: Cardiovascular;  Laterality: N/A;  . Tee without cardioversion N/A 07/23/2015    Procedure: TRANSESOPHAGEAL ECHOCARDIOGRAM (TEE);  Surgeon: Pricilla Riffle, MD;  Location: [facility] ENDOSCOPY;  Service: Cardiovascular;  Laterality: N/A;  PT NEED A LOOP   Social History:  reports that she has quit smoking. She does not have any smokeless tobacco history on file. She reports that she does not drink alcohol or use illicit drugs.  Family / Support Systems Marital Status: Married Patient Roles: Spouse, Parent Spouse/Significant Other: husband, Naomy Esham @ (H) [phone] or ([phone] Children: daughter, [name] @ [phone] Octavio Manns) Other Supports: nephew, Darely Becknell [place]) and pt's sister, Steward Drone both live with pt and husband Anticipated Caregiver: husband Ahja Martello, (H) [phone], (C) [phone] Ability/Limitations of Caregiver: pt. lost his job this  year, available 24/7 to care for pt.  Pt's sister does not work and can assist as well. Caregiver Availability: 24/7 Family Dynamics: Pt describes all family as supportive.  Husband very insistent with me about the care he "expects" for his wife at our hospital.  Encouraging to pt as well but more focused on his monitoring of her care.  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  Offers only brief answers to quesions.  Expresses concern that "...I can't do like I used to...".  Is A&O x 4.  Denies any significant emotional distress.  Denies any s/s of depression and will defer formal screening at this point - will monitor and refer to neuropsychology as appropriate. Recent Psychosocial Issues: MS and ESRD/  chronic illness and declining physical abilities for several years.  Husband laid off from his job placing financial stress on household as well. Pyschiatric History: None Substance Abuse History: None  Patient / Family Perceptions, Expectations & Goals Pt/Family understanding of illness & functional limitations: Pt able to report she understands she had a stroke but nothing further.  Husband strongly believes that a quick drop in her BP caused her to suffer these CVAs.  He has general understanding of her current functional limitations and likelihood that she will require 24/7 care at home. Premorbid pt/family roles/activities: Per husband, pt was walking short distances in the home and was "good' cognitively.  Notes she was the one managing bills and her own medicines. Anticipated changes in roles/activities/participation: Husband will need to assume several of the roles that pt covered PTA.  He will also need to be able to provide 24/7 caregiver support. Pt/family expectations/goals: "I just want to be  like I was." (pt)  [employer]: Other (Comment) (HD at [facility] in [place]) Premorbid Home Care/DME Agencies: None Transportation available at discharge: yes Resource referrals recommended: Neuropsychology, Support group (specify)  Discharge Planning Living Arrangements: Spouse/significant other, Other relatives Support Systems: Spouse/significant other, Children, Other relatives Type of Residence: Private residence Insurance Resources: Media planner (specify) Herbalist) Financial Resources: SSD, SSI Financial Screen Referred: No Living Expenses: Own Money Management: Patient Does the patient have any problems obtaining your medications?: No Home Management: pt and family Patient/Family Preliminary Plans: Pt to return home with husband and other family members providing assist Social Work Anticipated Follow Up Needs: HH/OP, Support Group Expected length of stay: 14-16 days  Clinical Impression Frail appearing woman here following bil infarcts and with h/o MS and very impaired.  Husband very involved and vocal about his expectations of the care she is to received here.  Pt offers only short answers and husband appears angry throughout interview.  Family able to provide 24/7 assistance upon d/c.  Will follow for support and d/c planning.  Legend Pecore 07/29/2015, 9:36 AM

## 2015-07-29 NOTE — Progress Notes (Signed)
[facility] Progress Note  Subjective/Complaints: Patient slept well over night.  Using CPAP. Husband at bedside. She would like to get a little bit more rest before therapies begin.  ROS limited due to cognition.  However, denies CP, SOB, n/v/d.   Objective: Vital Signs: Blood pressure 153/54, pulse 66, temperature 98.4 F (36.9 C), temperature source Axillary, resp. rate 18, height 5' 1.5" (1.562 m), weight 65 kg (143 lb 4.8 oz), SpO2 100 %. No results found. Results for orders placed or performed during the hospital encounter of 07/26/15 (from the past 72 hour(s))  Glucose, capillary     Status: None   Collection Time: 07/27/15  6:45 AM  Result Value Ref Range   Glucose-Capillary 89 65 - 99 mg/dL  Glucose, capillary     Status: None   Collection Time: 07/27/15 11:46 AM  Result Value Ref Range   Glucose-Capillary 89 65 - 99 mg/dL  Glucose, capillary     Status: Abnormal   Collection Time: 07/27/15  5:02 PM  Result Value Ref Range   Glucose-Capillary 122 (H) 65 - 99 mg/dL  Glucose, capillary     Status: Abnormal   Collection Time: 07/27/15  9:11 PM  Result Value Ref Range   Glucose-Capillary 115 (H) 65 - 99 mg/dL  Glucose, capillary     Status: None   Collection Time: 07/28/15  6:57 AM  Result Value Ref Range   Glucose-Capillary 85 65 - 99 mg/dL  Glucose, capillary     Status: Abnormal   Collection Time: 07/28/15 11:52 AM  Result Value Ref Range   Glucose-Capillary 116 (H) 65 - 99 mg/dL  Glucose, capillary     Status: None   Collection Time: 07/28/15  5:08 PM  Result Value Ref Range   Glucose-Capillary 92 65 - 99 mg/dL  Glucose, capillary     Status: Abnormal   Collection Time: 07/28/15  8:54 PM  Result Value Ref Range   Glucose-Capillary 124 (H) 65 - 99 mg/dL  Glucose, capillary     Status: None   Collection Time: 07/29/15  6:27 AM  Result Value Ref Range   Glucose-Capillary 87 65 - 99 mg/dL  Glucose, capillary     Status: Abnormal   Collection Time: 07/29/15 12:02 PM  Result Value Ref Range   Glucose-Capillary 134 (H) 65 - 99 mg/dL   Comment 1 Notify RN      Gen: NAD.  VS reviewed.  HEENT: Normocephalic, atraumatic Eyes: Conj WNL, EOMI Cardio: RRR and murmur Resp: CTA B/L and unlabored GI: BS positive and NT, ND Skin:   Intact. Warm and Dry. MSK:   Strength B/l UE 4/5 grossly Antigravity strength throughout.  Neurological: She is alert.  Patient disoriented. Lacks insight and awareness of deficits. Able to follow simple motor commands without difficulty.  Skin: Skin is warm and dry. No rash noted. She is not diaphoretic. No erythema.  Psychiatric: Her affect is blunt. Her speech is delayed. She is slowed. Cognition and memory are impaired.    Assessment/Plan: 1. 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 H/o MS with chronic paraplegia.    which require 3+ hours per day of interdisciplinary therapy in a comprehensive inpatient rehab setting. Physiatrist is providing close team supervision and 24 hour management of active medical problems listed below. Physiatrist and rehab team continue to assess barriers to discharge/monitor patient progress toward functional and medical goals. FIM: Function - Bathing Bathing activity did not occur: N/A (changed  to evening bath) Position: Wheelchair/chair at sink Body parts bathed by patient: Chest, Abdomen Body parts bathed by helper: Right arm, Left arm, Front perineal area, Buttocks, Right upper leg, Left upper leg, Right lower leg, Left lower leg, Back Assist Level: 2 helpers  Function- Upper Body Dressing/Undressing What is the patient wearing?: Pull over shirt/dress Pull over shirt/dress - Perfomed by patient: Thread/unthread right sleeve, Thread/unthread left sleeve, Put head through opening, Pull shirt over trunk Pull over shirt/dress - Perfomed by helper: Thread/unthread right sleeve, Thread/unthread left sleeve, Put  head through opening, Pull shirt over trunk Assist Level: Touching or steadying assistance(Pt > 75%) Function - Lower Body Dressing/Undressing What is the patient wearing?: Underwear, Pants, Non-skid slipper socks Position: Wheelchair/chair at sink Underwear - Performed by helper: Thread/unthread right underwear leg, Thread/unthread left underwear leg, Pull underwear up/down Pants- Performed by helper: Thread/unthread right pants leg, Thread/unthread left pants leg, Pull pants up/down, Fasten/unfasten pants Non-skid slipper socks- Performed by helper: Don/doff right sock, Don/doff left sock Assist for lower body dressing: 2 Helpers  Function - Toileting Toileting activity did not occur: N/A Toileting steps completed by helper: Adjust clothing prior to toileting, Performs perineal hygiene, Adjust clothing after toileting Assist level: Two helpers  Function - Archivist transfer activity did not occur: N/A Toilet transfer assistive device: Grab bar, Elevated toilet seat/BSC over toilet Mechanical lift: Stedy Assist level to toilet: Moderate assist (Pt 50 - 74%/lift or lower) Assist level from toilet: Maximal assist (Pt 25 - 49%/lift and lower)  Function - Chair/bed transfer Chair/bed transfer method: Squat pivot Chair/bed transfer assist level: 2 helpers Chair/bed transfer details: Tactile cues for initiation, Tactile cues for posture, Verbal cues for technique, Verbal cues for sequencing, Verbal cues for precautions/safety, Manual facilitation for weight shifting, Manual facilitation for placement, Manual facilitation for weight bearing  Function - Locomotion: Wheelchair Will patient use wheelchair at discharge?: Yes Type: Manual Max wheelchair distance: 5 ft Assist Level: Touching or steadying assistance (Pt > 75%) Wheel 50 feet with 2 turns activity did not occur: Safety/medical concerns Wheel 150 feet activity did not occur: Safety/medical concerns Turns  around,maneuvers to table,bed, and toilet,negotiates 3% grade,maneuvers on rugs and over doorsills: No Function - Locomotion: Ambulation Ambulation activity did not occur: Safety/medical concerns (lethargic, unable to maintain static standing using RW > 5 sec with max A x 2 before sitting) Walk 10 feet activity did not occur: Safety/medical concerns Walk 50 feet with 2 turns activity did not occur: Safety/medical concerns Walk 150 feet activity did not occur: Safety/medical concerns Walk 10 feet on uneven surfaces activity did not occur: Safety/medical concerns  Function - Comprehension Comprehension: Auditory Comprehension assist level: Understands basic 25 - 49% of the time/ requires cueing 50 - 75% of the time  Function - Expression Expression: Verbal Expression assist level: Expresses basic 50 - 74% of the time/requires cueing 25 - 49% of the time. Needs to repeat parts of sentences.  Function - Social Interaction Social Interaction assist level: Interacts appropriately 25 - 49% of time - Needs frequent redirection.  Function - Problem Solving Problem solving assist level: Solves basic 25 - 49% of the time - needs direction more than half the time to initiate, plan or complete simple activities  Function - Memory Memory assist level: Recognizes or recalls 25 - 49% of the time/requires cueing 50 - 75% of the time Patient normally able to recall (first 3 days only): That he or she is in a hospital, Current season  Medical Problem  List and Plan: 1. 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 H/o MS.    2.  DVT Prophylaxis/Anticoagulation: Pharmaceutical: Heparin 3. Pain Management: Tylenol prn   4. Mood: LCSW to follow for evaluation and support.   5. Neuropsych: This patient is not capable of making decisions on her own behalf. 6. Skin/Wound Care: Routine pressure relief measures 7. Fluids/Electrolytes/Nutrition: Monitor I/O. 1200 cc FR.   Labs with HD.   8. 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    9. HTN: Need to prevent hypotension to prevent recurrent watershed infarcts.  Labile pressures, will cont to monitor and educate pt's husband regarding holding medications.   10. ESRD: Has history of orhtostatic changes.   11. OSA: Continue CPAP at bedtime/sleeping.   12. E coli Sepsis: continue Rocephin.   13. Arrthymias:  Husband refusing Coreg--he holds her BP meds at home if SBP < 180. He has refused multiple doses of coreg. Relayed reported episodes of  asymptomatic VT and need to continue coreg. Will set hold parameter on Cardura.   14. DM type 2: Husband does not want BS covered if under 120.  Will monitor BS ac/hs. BS well controlled off lantus. Will continue SSI for now.  Fasting FSBG 87 this AM. 15. COPD: Uses oxygen prn with activity.   LOS (Days) 3 A FACE TO FACE EVALUATION WAS PERFORMED  Ankit Karis Juba 07/29/2015, 1:42 PM

## 2015-07-30 ENCOUNTER — Inpatient Hospital Stay (HOSPITAL_COMMUNITY): Payer: Medicare Other

## 2015-07-30 ENCOUNTER — Inpatient Hospital Stay (HOSPITAL_COMMUNITY): Payer: Medicare Other | Admitting: Physical Therapy

## 2015-07-30 ENCOUNTER — Inpatient Hospital Stay (HOSPITAL_COMMUNITY): Payer: Medicare Other | Admitting: Speech Pathology

## 2015-07-30 LAB — CBC WITH DIFFERENTIAL/PLATELET
BASOS ABS: 0 10*3/uL (ref 0.0–0.1)
Basophils Relative: 0 %
EOS PCT: 2 %
Eosinophils Absolute: 0.3 10*3/uL (ref 0.0–0.7)
HEMATOCRIT: 34.4 % — AB (ref 36.0–46.0)
HEMOGLOBIN: 10.9 g/dL — AB (ref 12.0–15.0)
LYMPHS ABS: 1.1 10*3/uL (ref 0.7–4.0)
Lymphocytes Relative: 7 %
MCH: 28.5 pg (ref 26.0–34.0)
MCHC: 31.7 g/dL (ref 30.0–36.0)
MCV: 90.1 fL (ref 78.0–100.0)
Monocytes Absolute: 2.9 10*3/uL — ABNORMAL HIGH (ref 0.1–1.0)
Monocytes Relative: 18 %
Neutro Abs: 11.8 10*3/uL — ABNORMAL HIGH (ref 1.7–7.7)
Neutrophils Relative %: 73 %
Platelets: 84 10*3/uL — ABNORMAL LOW (ref 150–400)
RBC: 3.82 MIL/uL — AB (ref 3.87–5.11)
RDW: 16.2 % — AB (ref 11.5–15.5)
WBC: 16.1 10*3/uL — AB (ref 4.0–10.5)

## 2015-07-30 LAB — BASIC METABOLIC PANEL
Anion gap: 11 (ref 5–15)
BUN: 17 mg/dL (ref 6–20)
CHLORIDE: 96 mmol/L — AB (ref 101–111)
CO2: 27 mmol/L (ref 22–32)
CREATININE: 3.47 mg/dL — AB (ref 0.44–1.00)
Calcium: 8.2 mg/dL — ABNORMAL LOW (ref 8.9–10.3)
GFR calc Af Amer: 16 mL/min — ABNORMAL LOW (ref >60)
GFR calc non Af Amer: 14 mL/min — ABNORMAL LOW (ref >60)
Glucose, Bld: 99 mg/dL (ref 65–99)
Potassium: 3.9 mmol/L (ref 3.5–5.1)
SODIUM: 134 mmol/L — AB (ref 135–145)

## 2015-07-30 LAB — GLUCOSE, CAPILLARY
GLUCOSE-CAPILLARY: 101 mg/dL — AB (ref 65–99)
GLUCOSE-CAPILLARY: 97 mg/dL (ref 65–99)
GLUCOSE-CAPILLARY: 126 mg/dL — AB (ref 65–99)
Glucose-Capillary: 103 mg/dL — ABNORMAL HIGH (ref 65–99)
Glucose-Capillary: 84 mg/dL (ref 65–99)

## 2015-07-30 MED ORDER — ACETAMINOPHEN 325 MG PO TABS: 325.0000 mg | ORAL_TABLET | Freq: Four times a day (QID) | ORAL | Status: DC | PRN

## 2015-07-30 MED ORDER — CAPTOPRIL 6.25 MG HALF TABLET
6.2500 mg | ORAL_TABLET | ORAL | Status: DC | PRN
  Administered 2015-07-31: 6.25 mg via ORAL
  Filled 2015-07-30 (×2): qty 1

## 2015-07-30 NOTE — Progress Notes (Signed)
pts husband told staff to not worry about giving pt a bath this shift. She was in HD until 0115 this morning, and they asked that we do her bath this morning. When staff tried to do bath this morning, the patient was too tired and they asked that we not do it. Will report to day shift. Rudie Meyer, RN

## 2015-07-30 NOTE — Progress Notes (Signed)
Patient did not require coreg throughout day. Patient declined 1800 medications , renevela and pravacol . Patient's husband reports patient has not been on renevela in over a month. Patient agreed to take renevela this am and lunch. Patient off schedule for  HD. Will go to dialysis tomorrow. Continue with plan of care. Gail Chapman

## 2015-07-30 NOTE — Progress Notes (Signed)
Occupational Therapy Session Note  Patient Details  Name: Gail Chapman MRN: [id number] Date of Birth: 07-07-1956  Today's Date: 07/30/2015 OT Individual Time: 9937-1696 OT Individual Time Calculation (min): 24 min  and Today's Date: 07/30/2015 OT Missed Time: 36 Minutes Missed Time Reason: Patient fatigue   Short Term Goals: Week 1:  OT Short Term Goal 1 (Week 1): Pt will wash UB with min A. OT Short Term Goal 2 (Week 1): Pt will don shirt with max A.  OT Short Term Goal 3 (Week 1): Pt will transfer to toilet with squat pivot of max A x1.  OT Short Term Goal 4 (Week 1): Pt will actively use left arm to wash R arm with min A. OT Short Term Goal 5 (Week 1): Pt will find items on her left side with mod cues.  Skilled Therapeutic Interventions/Progress Updates:    Pt resting in w/c with RN present upon arrival.  Pt did not return from HD until 0115 and was very lethargic. Pt exhibited difficulty keeping eyes open at beginning of session.  Discussion with SLP after earlier session revealed that patient unable to fully engage after approx 30 mins.  Pt stated that she didn't have any energy and needed to rest.  Pt required max A for squat pivot transfer to bed and tot A + 2 for sit<>supine.  Pt missed 36 mins skilled OT services secondary to fatigue.  Therapy Documentation Precautions:  Precautions Precautions: Fall Precaution Comments: hx of MS with baseline visual changes Restrictions Weight Bearing Restrictions: No General: General OT Amount of Missed Time: 36 Minutes Pain: Pain Assessment Pain Assessment: No/denies pain  See Function Navigator for Current Functional Status.   Therapy/Group: Individual Therapy  Rich Brave 07/30/2015, 9:37 AM

## 2015-07-30 NOTE — Progress Notes (Signed)
Placed pt. On cpap. Pt. Tolerating well at this time. Pt. Has 2L of oxygen titrated into the cpap.

## 2015-07-30 NOTE — Progress Notes (Signed)
[facility] Progress Note  Subjective/Complaints: Patient slept well overnight. She had a good day in therapy yesterday. She does not have any complaints at present.  ROS limited due to cognition.  However, denies CP, SOB, n/v/d.   Objective: Vital Signs: Blood pressure 129/63, pulse 76, temperature 99.2 F (37.3 C), temperature source Oral, resp. rate 18, height 5' 1.5" (1.562 m), weight 64.8 kg (142 lb 13.7 oz), SpO2 100 %. No results found. Results for orders placed or performed during the hospital encounter of 07/26/15 (from the past 72 hour(s))  Glucose, capillary     Status: None   Collection Time: 07/27/15 11:46 AM  Result Value Ref Range   Glucose-Capillary 89 65 - 99 mg/dL  Glucose, capillary     Status: Abnormal   Collection Time: 07/27/15  5:02 PM  Result Value Ref Range   Glucose-Capillary 122 (H) 65 - 99 mg/dL  Glucose, capillary     Status: Abnormal   Collection Time: 07/27/15  9:11 PM  Result Value Ref Range   Glucose-Capillary 115 (H) 65 - 99 mg/dL  Glucose, capillary     Status: None   Collection Time: 07/28/15  6:57 AM  Result Value Ref Range   Glucose-Capillary 85 65 - 99 mg/dL  Glucose, capillary     Status: Abnormal   Collection Time: 07/28/15 11:52 AM  Result Value Ref Range   Glucose-Capillary 116 (H) 65 - 99 mg/dL  Glucose, capillary     Status: None   Collection Time: 07/28/15  5:08 PM  Result Value Ref Range   Glucose-Capillary 92 65 - 99 mg/dL  Glucose, capillary     Status: Abnormal   Collection Time: 07/28/15  8:54 PM  Result Value Ref Range   Glucose-Capillary 124 (H) 65 - 99 mg/dL  Glucose, capillary     Status: None   Collection Time: 07/29/15  6:27 AM  Result Value Ref Range   Glucose-Capillary 87 65 - 99 mg/dL  Glucose, capillary     Status: Abnormal   Collection Time: 07/29/15 12:02 PM  Result Value Ref Range   Glucose-Capillary 134 (H) 65 - 99 mg/dL   Comment 1 Notify RN   Glucose, capillary      Status: None   Collection Time: 07/29/15  4:25 PM  Result Value Ref Range   Glucose-Capillary 83 65 - 99 mg/dL   Comment 1 Notify RN   Renal function panel     Status: Abnormal   Collection Time: 07/29/15 10:15 PM  Result Value Ref Range   Sodium 134 (L) 135 - 145 mmol/L   Potassium 4.1 3.5 - 5.1 mmol/L   Chloride 96 (L) 101 - 111 mmol/L   CO2 23 22 - 32 mmol/L   Glucose, Bld 96 65 - 99 mg/dL   BUN 62 (H) 6 - 20 mg/dL   Creatinine, Ser 7.32 (H) 0.44 - 1.00 mg/dL   Calcium 8.3 (L) 8.9 - 10.3 mg/dL   Phosphorus 8.4 (H) 2.5 - 4.6 mg/dL   Albumin 2.5 (L) 3.5 - 5.0 g/dL   GFR calc non Af Amer 6 (L) >60 mL/min   GFR calc Af Amer 6 (L) >60 mL/min    Comment: (NOTE) The eGFR has been calculated using the CKD EPI equation. This calculation has not been validated in all clinical situations. eGFR's persistently <60 mL/min signify possible Chronic Kidney Disease.    Anion gap 15 5 - 15  Glucose, capillary     Status: Abnormal  Collection Time: 07/30/15  1:20 AM  Result Value Ref Range   Glucose-Capillary 103 (H) 65 - 99 mg/dL  Basic metabolic panel     Status: Abnormal   Collection Time: 07/30/15  4:55 AM  Result Value Ref Range   Sodium 134 (L) 135 - 145 mmol/L   Potassium 3.9 3.5 - 5.1 mmol/L   Chloride 96 (L) 101 - 111 mmol/L   CO2 27 22 - 32 mmol/L   Glucose, Bld 99 65 - 99 mg/dL   BUN 17 6 - 20 mg/dL   Creatinine, Ser 3.47 (H) 0.44 - 1.00 mg/dL    Comment: DELTA CHECK NOTED   Calcium 8.2 (L) 8.9 - 10.3 mg/dL   GFR calc non Af Amer 14 (L) >60 mL/min   GFR calc Af Amer 16 (L) >60 mL/min    Comment: (NOTE) The eGFR has been calculated using the CKD EPI equation. This calculation has not been validated in all clinical situations. eGFR's persistently <60 mL/min signify possible Chronic Kidney Disease.    Anion gap 11 5 - 15  CBC with Differential/Platelet     Status: Abnormal   Collection Time: 07/30/15  4:55 AM  Result Value Ref Range   WBC 16.1 (H) 4.0 - 10.5 K/uL   RBC  3.82 (L) 3.87 - 5.11 MIL/uL   Hemoglobin 10.9 (L) 12.0 - 15.0 g/dL   HCT 34.4 (L) 36.0 - 46.0 %   MCV 90.1 78.0 - 100.0 fL   MCH 28.5 26.0 - 34.0 pg   MCHC 31.7 30.0 - 36.0 g/dL   RDW 16.2 (H) 11.5 - 15.5 %   Platelets 84 (L) 150 - 400 K/uL    Comment: SPECIMEN CHECKED FOR CLOTS REPEATED TO VERIFY PLATELET COUNT CONFIRMED BY SMEAR    Neutrophils Relative % 73 %   Lymphocytes Relative 7 %   Monocytes Relative 18 %   Eosinophils Relative 2 %   Basophils Relative 0 %   Neutro Abs 11.8 (H) 1.7 - 7.7 K/uL   Lymphs Abs 1.1 0.7 - 4.0 K/uL   Monocytes Absolute 2.9 (H) 0.1 - 1.0 K/uL   Eosinophils Absolute 0.3 0.0 - 0.7 K/uL   Basophils Absolute 0.0 0.0 - 0.1 K/uL   WBC Morphology MILD LEFT SHIFT (1-5% METAS, OCC MYELO, OCC BANDS)   Glucose, capillary     Status: None   Collection Time: 07/30/15  7:02 AM  Result Value Ref Range   Glucose-Capillary 84 65 - 99 mg/dL     Gen: NAD.  VS reviewed.  HEENT: Normocephalic, atraumatic Eyes: Conj WNL, EOMI Cardio: RRR and murmur Resp: CTA B/L and unlabored GI: BS positive and NT, ND Skin:   Intact. Warm and Dry. MSK:   Strength B/l UE 4/5 grossly Antigravity strength throughout.  Neurological: She is alert.  Patient disoriented. Lacks insight and awareness of deficits. Able to follow simple motor commands without difficulty.  Skin: Skin is warm and dry. No rash noted. She is not diaphoretic. No erythema.  Psychiatric: Her affect is blunt. Her speech is delayed. She is slowed. Cognition and memory are impaired.    Assessment/Plan: 1. 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 H/o MS with chronic paraplegia.    which require 3+ hours per day of interdisciplinary therapy in a comprehensive inpatient rehab setting. Physiatrist is providing close team supervision and 24 hour management of active medical problems listed below. Physiatrist and rehab team continue to assess barriers to  discharge/monitor patient progress  toward functional and medical goals. FIM: Function - Bathing Bathing activity did not occur: N/A (changed to evening bath) Position: Wheelchair/chair at sink Body parts bathed by patient: Chest, Abdomen Body parts bathed by helper: Right arm, Left arm, Front perineal area, Buttocks, Right upper leg, Left upper leg, Right lower leg, Left lower leg, Back Assist Level: 2 helpers  Function- Upper Body Dressing/Undressing What is the patient wearing?: Pull over shirt/dress Pull over shirt/dress - Perfomed by patient: Thread/unthread right sleeve, Thread/unthread left sleeve, Put head through opening, Pull shirt over trunk Pull over shirt/dress - Perfomed by helper: Thread/unthread right sleeve, Thread/unthread left sleeve, Put head through opening, Pull shirt over trunk Assist Level: Touching or steadying assistance(Pt > 75%) Function - Lower Body Dressing/Undressing What is the patient wearing?: Underwear, Pants, Non-skid slipper socks Position: Wheelchair/chair at sink Underwear - Performed by helper: Thread/unthread right underwear leg, Thread/unthread left underwear leg, Pull underwear up/down Pants- Performed by helper: Thread/unthread right pants leg, Thread/unthread left pants leg, Pull pants up/down, Fasten/unfasten pants Non-skid slipper socks- Performed by helper: Don/doff right sock, Don/doff left sock Assist for lower body dressing: 2 Helpers  Function - Toileting Toileting activity did not occur: N/A Toileting steps completed by helper: Adjust clothing prior to toileting, Performs perineal hygiene, Adjust clothing after toileting Assist level: Two helpers  Function - Air cabin crew transfer activity did not occur: N/A Toilet transfer assistive device: Grab bar, Elevated toilet seat/BSC over toilet Mechanical lift: Stedy Assist level to toilet: Moderate assist (Pt 50 - 74%/lift or lower) Assist level from toilet: Maximal assist (Pt 25  - 49%/lift and lower)  Function - Chair/bed transfer Chair/bed transfer method: Squat pivot Chair/bed transfer assist level: Maximal assist (Pt 25 - 49%/lift and lower) Chair/bed transfer assistive device: Armrests Chair/bed transfer details: Verbal cues for technique, Verbal cues for sequencing, Verbal cues for precautions/safety, Manual facilitation for weight shifting  Function - Locomotion: Wheelchair Will patient use wheelchair at discharge?: Yes Type: Manual Max wheelchair distance: 25 ft Assist Level: Touching or steadying assistance (Pt > 75%) Wheel 50 feet with 2 turns activity did not occur: Safety/medical concerns Wheel 150 feet activity did not occur: Safety/medical concerns Turns around,maneuvers to table,bed, and toilet,negotiates 3% grade,maneuvers on rugs and over doorsills: No Function - Locomotion: Ambulation Ambulation activity did not occur: Safety/medical concerns (lethargic, unable to maintain static standing using RW > 5 sec with max A x 2 before sitting) Walk 10 feet activity did not occur: Safety/medical concerns Walk 50 feet with 2 turns activity did not occur: Safety/medical concerns Walk 150 feet activity did not occur: Safety/medical concerns Walk 10 feet on uneven surfaces activity did not occur: Safety/medical concerns  Function - Comprehension Comprehension: Auditory Comprehension assist level: Understands basic 25 - 49% of the time/ requires cueing 50 - 75% of the time  Function - Expression Expression: Verbal Expression assist level: Expresses basic 50 - 74% of the time/requires cueing 25 - 49% of the time. Needs to repeat parts of sentences.  Function - Social Interaction Social Interaction assist level: Interacts appropriately 25 - 49% of time - Needs frequent redirection.  Function - Problem Solving Problem solving assist level: Solves basic 25 - 49% of the time - needs direction more than half the time to initiate, plan or complete simple  activities  Function - Memory Memory assist level: Recognizes or recalls 25 - 49% of the time/requires cueing 50 - 75% of the time Patient normally able to recall (first 3 days only): That he or  she is in a hospital, Current season  Medical Problem List and Plan: 1. 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 H/o MS.    2.  DVT Prophylaxis/Anticoagulation: SCDs ordered 10/21. Pharmaceutical: Heparin on hold for HIT Ab 3. Pain Management: Tylenol prn   4. Mood: LCSW to follow for evaluation and support.   5. Neuropsych: This patient is not capable of making decisions on her own behalf. 6. Skin/Wound Care: Routine pressure relief measures 7. Fluids/Electrolytes/Nutrition: Monitor I/O. 1200 cc FR.  Labs with HD.   8. 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    9. HTN: Need to prevent hypotension to prevent recurrent watershed infarcts.  Labile pressures, due to husband holding medications.  Will cont to monitor and educate pt's husband regarding holding medications.   10. ESRD: Has history of orhtostatic changes.   11. OSA: Continue CPAP at bedtime/sleeping.   12. E coli Sepsis: continue Rocephin.   13. Arrthymias:  Husband refusing Coreg--he holds her BP meds at home if SBP < 180. He has refused multiple doses of coreg. Relayed reported episodes of  asymptomatic VT and need to continue coreg. Will set hold parameter on Cardura.   14. DM type 2: Husband does not want BS covered if under 120.  Will monitor BS ac/hs. BS well controlled off lantus. Will continue SSI for now.  Fasting FSBG 84 this AM. 15. COPD: Uses oxygen prn with activity.  16. Anemia: Hb 10.9 on 10/21. 17. Leukocytosis: Trending up.  Will order UA, Ucx.  Will reorder labs for tomorrow.  18. Thrombocytopenia: Plts 84 on 10/21.  Will order HIT Ab due to recent decline.   LOS (Days) 4 A FACE TO FACE EVALUATION WAS PERFORMED  Ankit Lorie Phenix 07/30/2015,  10:34 AM

## 2015-07-30 NOTE — Progress Notes (Signed)
Speech Language Pathology Daily Session Note  Patient Details  Name: Gail Chapman MRN: [id number] Date of Birth: 1955/12/03  Today's Date: 07/30/2015 SLP Individual Time: 0803-0900 SLP Individual Time Calculation (min): 57 min  Short Term Goals: Week 1: SLP Short Term Goal 1 (Week 1): Pt will sustain attention to functional task for 2 minutes with Max cues SLP Short Term Goal 2 (Week 1): Pt will utilize external memory aids to facilitate recall of new and daily information with Max cues SLP Short Term Goal 3 (Week 1): Pt will verbalize at least 1 physical and 2 cognitive deficits with Max cues SLP Short Term Goal 4 (Week 1): Pt will complete basic problem solving tasks with 80% accuracy with Max cues SLP Short Term Goal 5 (Week 1): Pt will initiate functional tasks with no more than 2 verbal cues in 50% of opportunities SLP Short Term Goal 6 (Week 1): Pt will consume her currenty prescribed diet with min verbal cues for use of swallowing precautions and minimal overt s/s of aspiration.   Skilled Therapeutic Interventions:  Pt was seen for skilled ST targeting cognitive and dysphagia goals.  Per report, pt returned from dialysis early this AM and had not slept well last night.  Pt was transferred to wheelchair with assist from nursing to maximize attention and alertness during structured tasks.  Pt consumed her currently prescribed diet with max- total assist multimodal cues for initiation of self feeding.  No overt s/s of aspiration were evident with liquids or solids.  Dysphagia appears to be primarily based in pt's cognitive deficits.  Upon completion of meal, pt required max to total assist to initiate and sequence hand hygiene.  Pt was left upright in wheelchair with quick release belt donned and call bell left within reach.  Continue per current plan of care.    Function:  Eating Eating   Modified Consistency Diet: Yes Eating Assist Level: Helper scoops food on utensil;Helper brings  food to mouth;Help managing cup/glass;Help with picking up utensils;Helper checks for pocketed food;Hand over hand assist           Cognition Comprehension Comprehension assist level: Understands basic 25 - 49% of the time/ requires cueing 50 - 75% of the time  Expression   Expression assist level: Expresses basic 50 - 74% of the time/requires cueing 25 - 49% of the time. Needs to repeat parts of sentences.  Social Interaction Social Interaction assist level: Interacts appropriately 25 - 49% of time - Needs frequent redirection.  Problem Solving Problem solving assist level: Solves basic 25 - 49% of the time - needs direction more than half the time to initiate, plan or complete simple activities  Memory Memory assist level: Recognizes or recalls 25 - 49% of the time/requires cueing 50 - 75% of the time    Pain Pain Assessment Pain Assessment: No/denies pain   Therapy/Group: Individual Therapy  Emilliano Dilworth, Melanee Spry 07/30/2015, 12:22 PM

## 2015-07-30 NOTE — Progress Notes (Signed)
Physical Therapy Session Note  Patient Details  Name: Gail Chapman MRN: [id number] Date of Birth: 16-Apr-1956  Today's Date: 07/30/2015 PT Individual Time: 1100-1200 Treatment Session 2: 1500-1530 PT Individual Time Calculation (min): 60 min Treatment Session 2: 30 min  Short Term Goals: Week 1:  PT Short Term Goal 1 (Week 1): Pt will sustain attention to functional task x 2 minutes with max multimodal cues.  PT Short Term Goal 2 (Week 1): Patient will transfer bed <> wheelchair with assist of one person.  PT Short Term Goal 3 (Week 1): Patient will initiate gait training.  PT Short Term Goal 4 (Week 1): Patient will propel wheelchair x 50 ft with supervision and max multimodal cues.   Skilled Therapeutic Interventions/Progress Updates:    Treatment Session 1: Pt received in bed, resting, very difficult to arouse. PT employs tapping, sternal rub, and ultimately raises HOB maximally, which causes pt to open her eyes and interact with therapist. Therapeutic Activity: PT instructs pt in rolling R/L multiple times during treatment session req min-mod A overall in flat bed without rail. Pt reports she needs to have a BM. Bedpan placed with therapist & Tech providing total assist for pants management and hygiene. Once pt cleaned, PT and tech transfer pt side lie to sit, and squat-pivot transfer w/c to bed to pt's Left - see function tab for details. Pt req simple, one-step cues for sequencing and increased time for all mobility. Therapeutic Exercise - Pt c/o neck pain and PT instructs pt in gentle A/AROM cervical rotation R/L, cervical side flexion R/L, and cervical flexion/extension: x 5-10 reps as pt tolerates. AROM limited 75% globally. PT suspects pt guarding her neck from movement, as she reports it was stiff upon waking this morning. Pt ended up in w/c with quick release belt on and all needs in reach - nurse tech notified.   Treatment Session 2: Pt received in bed, difficult to arouse due to  fatigue. Therapeutic Activity - See function tab for details. Pt req increased time and significant manual assist due to delays in motor planning and initiation. Pt does stand x 2 reps from eob and take a total of 4 steps sideways at bedside. Neuromuscular Reeducation - PT instructs pt in dynamic sitting balance activities: LAQ 2 x 5 reps each req up to min A for balance. PT instructs pt in static sit balance with focus on postural reeducation - head up, spine straight - pt able to achieve, but muscles fatigue quickly and pt req up to min A for this activity, as well. Pt ended in bed in partial R side lie with husband present and all needs in reach. Continue per PT POC.    Therapy Documentation Precautions:  Precautions Precautions: Fall Precaution Comments: hx of MS with baseline visual changes Restrictions Weight Bearing Restrictions: No Vital Signs: Therapy Vitals Pulse Rate: 72 BP: (!) 158/72 mmHg Patient Position (if appropriate): Lying (HOB maximally elevated) Oxygen Therapy SpO2: 99 % O2 Device: Not Delivered Pain: Pain Assessment Pain Assessment: 0-10 Pain Score: 8  Pain Type: Acute pain Pain Location: Neck Pain Orientation: Right Pain Descriptors / Indicators: Aching Pain Onset: Gradual Pain Intervention(s): Rest;Repositioned;Other (Comment) (gentle ROM exercises) Multiple Pain Sites: No Treatment Session 2: Pt c/o neck pain when lying head on pillow - unrated. PT repositions pt's head on pillow for improved comfort.    See Function Navigator for Current Functional Status.   Therapy/Group: Individual Therapy  Akeiba Axelson M 07/30/2015, 11:17 AM

## 2015-07-30 NOTE — Progress Notes (Signed)
Seizures & bilat acute CVA's - on rehab 1. ESRD - MWF [place]. HD- Below dry wt. Need to limit fluid removal due to severe LVH and sensitive BP with UF. (MWF [facility] 4 hrs 70.5kgs (new EDW 65.0kg) 2K/2.5CA L IJ Cath (no perm access) Heparin none Micera 75q 2 weeks) HD Sat off schedule. 3. Hypertension/volume .  4. Vascular access - using PC, no permanent access. Will defer to her primary nephrologist in [place].  Subjective: Interval History: Late night HD caused fatigue today  Objective: Vital signs in last 24 hours: Temp:  [97.8 F (36.6 C)-99.2 F (37.3 C)] 97.8 F (36.6 C) (10/21 1315) Pulse Rate:  [67-79] 74 (10/21 1315) Resp:  [16-18] 18 (10/21 1315) BP: (129-187)/(55-93) 176/71 mmHg (10/21 1315) SpO2:  [97 %-100 %] 99 % (10/21 1315) Weight:  [64.8 kg (142 lb 13.7 oz)-66.4 kg (146 lb 6.2 oz)] 64.8 kg (142 lb 13.7 oz) (10/21 0559) Weight change: 0.7 kg (1 lb 8.7 oz)  Intake/Output from previous day: 10/20 0701 - 10/21 0700 In: 300 [P.O.:300] Out: 2000  Intake/Output this shift: Total I/O In: 240 [P.O.:240] Out: -   General appearance: alert and cooperative Resp: clear to auscultation bilaterally Extremities: edema tr  Lab Results:  Recent Labs  07/30/15 0455  WBC 16.1*  HGB 10.9*  HCT 34.4*  PLT 84*   BMET:  Recent Labs  07/29/15 2215 07/30/15 0455  NA 134* 134*  K 4.1 3.9  CL 96* 96*  CO2 23 27  GLUCOSE 96 99  BUN 62* 17  CREATININE 7.32* 3.47*  CALCIUM 8.3* 8.2*   No results for input(s): PTH in the last 72 hours. Iron Studies: No results for input(s): IRON, TIBC, TRANSFERRIN, FERRITIN in the last 72 hours. Studies/Results: No results found.  Scheduled: . antiseptic oral rinse  7 mL Mouth Rinse BID  . aspirin EC  325 mg Oral Daily  . calcitRIOL  0.25 mcg Oral Q M,W,F-HD  . carvedilol  12.5 mg Oral BID WC  . cefTRIAXone (ROCEPHIN)  IV  1 g Intravenous Q24H  . [START ON 08/04/2015] darbepoetin (ARANESP) injection -  DIALYSIS  60 mcg Intravenous Q Wed-HD  . divalproex  750 mg Oral Q12H  . doxazosin  2 mg Oral QHS  . insulin aspart  0-9 Units Subcutaneous TID WC  . pantoprazole  40 mg Oral QHS  . pravastatin  40 mg Oral q1800  . sevelamer carbonate  800 mg Oral TID WC     LOS: 4 days   Lakin Rhine C 07/30/2015,1:38 PM

## 2015-07-30 NOTE — Progress Notes (Signed)
Pts husband called RN to say pt had a headache. No PRN's available. RN called on call Dr. Allena Katz. While waiting on a call back from [place], [place] checked pts BP. BP 210/90 manually with a HR of 75. Pt complains of right side neck pain. Scheduled BP med given. Patel ordered PRN medication with parameters if BP does not come down when RN rechecks and tylenol if pain does not resolve with BP. Will continue to monitor. Rudie Meyer, RN

## 2015-07-31 ENCOUNTER — Inpatient Hospital Stay (HOSPITAL_COMMUNITY): Payer: Medicare Other | Admitting: Speech Pathology

## 2015-07-31 ENCOUNTER — Inpatient Hospital Stay (HOSPITAL_COMMUNITY): Payer: Medicare Other | Admitting: Occupational Therapy

## 2015-07-31 ENCOUNTER — Inpatient Hospital Stay (HOSPITAL_COMMUNITY): Payer: Medicare Other | Admitting: Physical Therapy

## 2015-07-31 LAB — GLUCOSE, CAPILLARY
GLUCOSE-CAPILLARY: 110 mg/dL — AB (ref 65–99)
GLUCOSE-CAPILLARY: 165 mg/dL — AB (ref 65–99)
GLUCOSE-CAPILLARY: 74 mg/dL (ref 65–99)
GLUCOSE-CAPILLARY: 92 mg/dL (ref 65–99)
Glucose-Capillary: 95 mg/dL (ref 65–99)

## 2015-07-31 LAB — RENAL FUNCTION PANEL
ALBUMIN: 2.5 g/dL — AB (ref 3.5–5.0)
ANION GAP: 15 (ref 5–15)
BUN: 49 mg/dL — AB (ref 6–20)
CALCIUM: 8.8 mg/dL — AB (ref 8.9–10.3)
CO2: 23 mmol/L (ref 22–32)
Chloride: 97 mmol/L — ABNORMAL LOW (ref 101–111)
Creatinine, Ser: 6.21 mg/dL — ABNORMAL HIGH (ref 0.44–1.00)
GFR calc Af Amer: 8 mL/min — ABNORMAL LOW (ref >60)
GFR, EST NON AFRICAN AMERICAN: 7 mL/min — AB (ref >60)
Glucose, Bld: 141 mg/dL — ABNORMAL HIGH (ref 65–99)
PHOSPHORUS: 8.4 mg/dL — AB (ref 2.5–4.6)
POTASSIUM: 4.4 mmol/L (ref 3.5–5.1)
SODIUM: 135 mmol/L (ref 135–145)

## 2015-07-31 LAB — CBC WITH DIFFERENTIAL/PLATELET
BLASTS: 0 %
Band Neutrophils: 0 %
Basophils Absolute: 0 10*3/uL (ref 0.0–0.1)
Basophils Relative: 0 %
EOS ABS: 0.3 10*3/uL (ref 0.0–0.7)
EOS PCT: 2 %
HCT: 34.2 % — ABNORMAL LOW (ref 36.0–46.0)
Hemoglobin: 10.8 g/dL — ABNORMAL LOW (ref 12.0–15.0)
LYMPHS ABS: 2 10*3/uL (ref 0.7–4.0)
Lymphocytes Relative: 14 %
MCH: 28.4 pg (ref 26.0–34.0)
MCHC: 31.6 g/dL (ref 30.0–36.0)
MCV: 90 fL (ref 78.0–100.0)
MYELOCYTES: 0 %
Metamyelocytes Relative: 0 %
Monocytes Absolute: 1.3 10*3/uL — ABNORMAL HIGH (ref 0.1–1.0)
Monocytes Relative: 9 %
Neutro Abs: 10.6 10*3/uL — ABNORMAL HIGH (ref 1.7–7.7)
Neutrophils Relative %: 75 %
Other: 0 %
PLATELETS: 79 10*3/uL — AB (ref 150–400)
Promyelocytes Absolute: 0 %
RBC: 3.8 MIL/uL — AB (ref 3.87–5.11)
RDW: 16.2 % — ABNORMAL HIGH (ref 11.5–15.5)
WBC: 14.2 10*3/uL — AB (ref 4.0–10.5)
nRBC: 0 /100 WBC

## 2015-07-31 LAB — CBC
HEMATOCRIT: 34 % — AB (ref 36.0–46.0)
HEMOGLOBIN: 10.4 g/dL — AB (ref 12.0–15.0)
MCH: 27.7 pg (ref 26.0–34.0)
MCHC: 30.6 g/dL (ref 30.0–36.0)
MCV: 90.4 fL (ref 78.0–100.0)
Platelets: 90 10*3/uL — ABNORMAL LOW (ref 150–400)
RBC: 3.76 MIL/uL — ABNORMAL LOW (ref 3.87–5.11)
RDW: 16.1 % — AB (ref 11.5–15.5)
WBC: 12 10*3/uL — AB (ref 4.0–10.5)

## 2015-07-31 LAB — HEPARIN INDUCED PLATELET AB (HIT ANTIBODY): HEPARIN INDUCED PLT AB: 1.076 {OD_unit} — AB (ref 0.000–0.400)

## 2015-07-31 MED ORDER — SODIUM CHLORIDE 0.9 % IV SOLN: 100.0000 mL | INTRAVENOUS | Status: DC | PRN

## 2015-07-31 MED ORDER — ALTEPLASE 2 MG IJ SOLR: 2.0000 mg | Freq: Once | INTRAMUSCULAR | Status: DC | PRN

## 2015-07-31 MED ORDER — CALCITRIOL 0.25 MCG PO CAPS
ORAL_CAPSULE | ORAL | Status: AC
  Filled 2015-07-31: qty 1

## 2015-07-31 MED ORDER — PENTAFLUOROPROP-TETRAFLUOROETH EX AERO: 1.0000 "application " | INHALATION_SPRAY | CUTANEOUS | Status: DC | PRN

## 2015-07-31 MED ORDER — LIDOCAINE HCL (PF) 1 % IJ SOLN: 5.0000 mL | INTRAMUSCULAR | Status: DC | PRN

## 2015-07-31 MED ORDER — LIDOCAINE-PRILOCAINE 2.5-2.5 % EX CREA: 1.0000 "application " | TOPICAL_CREAM | CUTANEOUS | Status: DC | PRN

## 2015-07-31 MED ORDER — HEPARIN SODIUM (PORCINE) 1000 UNIT/ML DIALYSIS: 1000.0000 [IU] | INTRAMUSCULAR | Status: DC | PRN

## 2015-07-31 NOTE — Progress Notes (Signed)
Physical Therapy Session Note  Patient Details  Name: Gail Chapman MRN: [id number] Date of Birth: 1956-01-14  Today's Date: 07/31/2015 PT Individual Time: 1100-1200 PT Individual Time Calculation (min): 60 min   Short Term Goals: Week 1:  PT Short Term Goal 1 (Week 1): Pt will sustain attention to functional task x 2 minutes with max multimodal cues.  PT Short Term Goal 2 (Week 1): Patient will transfer bed <> wheelchair with assist of one person.  PT Short Term Goal 3 (Week 1): Patient will initiate gait training.  PT Short Term Goal 4 (Week 1): Patient will propel wheelchair x 50 ft with supervision and max multimodal cues.   Skilled Therapeutic Interventions/Progress Updates:    Pt received up in w/c - c/o bottom pain - slow to respond to PT and leaning L slightly. Therapeutic Activity - PT repositioned pt for better posture in w/c req tot A. PT instructs pt in 3 sit to stands from w/c - variety of techniques tried: pushing up from armrests, holding onto PT's arms, holding onto sink - each attempt req tot A (pt effort 10-15%) and pt unable to achieve full stand. Pt req +2 assist for squat-pivot transfer w/c to/from mat with verbal cues for sequencing. Then Max A sit to supine, with PT manually facilitating initiation of lean onto L elbow - pt appearing to resist. W/C Management - See function tab for details: Pt req repeated verbal cues for attention to task: 20' + 20' req max, progressing quickly to tot A (pt effort 5-10%). PT asked pt how far she thought she could push and pt stated a goal of 20'. Pt denies fatigue and says she doesn't know why she stopped pushing, req repeated verbal cues to re-initiate and complete task. On 2nd rep, pt demonstrates the first few pushes with SBA, but quickly reverts to tot A, as stated above. Therapeutic Exercise - Once on mat, PT instructs pt in B LE AROM and strengthening exercises with AAROM as needed: bridges, heel slides, hip IR in B hook lie, clam  shells: 2 x 5 reps each LE, with extensive rest break in between. Pt ended up in w/c with quick release belt in place, all needs in reach, awaiting lunch and nurse tech for assistance.   Therapy Documentation Precautions:  Precautions Precautions: Fall Precaution Comments: hx of MS with baseline visual changes Restrictions Weight Bearing Restrictions: No Pain: Pain Assessment Pain Assessment: 0-10 Pain Score: 8  Pain Type: Acute pain Pain Location: Buttocks Pain Onset: Gradual Pain Intervention(s): Repositioned Multiple Pain Sites: No   See Function Navigator for Current Functional Status.   Therapy/Group: Individual Therapy  Maxene Byington M 07/31/2015, 11:05 AM

## 2015-07-31 NOTE — Progress Notes (Signed)
Occupational Therapy Session Note  Patient Details  Name: Gail Chapman MRN: [id number] Date of Birth: 09/10/1956  Today's Date: 07/31/2015 OT Individual Time:  -   0900-1000  (60 min)      Short Term Goals: Week 1:  OT Short Term Goal 1 (Week 1): Pt will wash UB with min A. OT Short Term Goal 2 (Week 1): Pt will don shirt with max A.  OT Short Term Goal 3 (Week 1): Pt will transfer to toilet with squat pivot of max A x1.  OT Short Term Goal 4 (Week 1): Pt will actively use left arm to wash R arm with min A. OT Short Term Goal 5 (Week 1): Pt will find items on her left side with mod cues. Week 2:     Skilled Therapeutic Interventions/Progress Updates:    Husband and tech assisting pt with toileting upon OT arrival.  Husband  Gail Chapman) performed manual assistance with stand pivot transfer with max assist.  Pt dependent with toileting.  Husband stated pt bathed at night.  Conversation was delayed with patient.  Pt combed hair with mod assist for thoroughness using RUE. Used stedy for sit to stand with +2. for peri care.  Change shirt with total assist.  Left in wc with all needs in place.  HR= 80, O2= 95%  Therapy Documentation Precautions:  Precautions Precautions: Fall Precaution Comments: hx of MS with baseline visual changes Restrictions Weight Bearing Restrictions: No Vital Signs: Therapy Vitals Pulse Rate: 74 BP: (!) 152/76 mmHg Pain:  None reported            See Function Navigator for Current Functional Status.   Therapy/Group: Individual Therapy  Humberto Seals 07/31/2015, 9:29 AM

## 2015-07-31 NOTE — Progress Notes (Signed)
Rechecked pts blood pressure manually. 198/86. Discussed MD order for PRN medication if systolic BP is >180. Husband said it was ok to give "if it doesn't drop too fast". RN explained there is no way to know how fast, or how much BP will drop, but educated on risk of high BP readings. Husband agreed to let pt take PRN med, and RN will recheck BP. Pt currently calm, and resting with CPAP on. RN previously placed a hot pack on her neck and pt said that helped, but said the pain was not gone yet. Removed heat and will reapply when BP is rechecked. Will continue to monitor pt. Rudie Meyer, RN

## 2015-07-31 NOTE — Progress Notes (Signed)
[facility] Progress Note  Subjective/Complaints: Patient with elevated blood pressures overnight. When necessary medication provided, however blood pressures would most likely be an issue if patient were taking medications as scheduled. Due to her husband's insistence regarding when to take blood pressure medications, they have remained labile.  ROS limited due to cognition.  However appears to deny CP, SOB, n/v/d.   Objective: Vital Signs: Blood pressure 152/76, pulse 74, temperature 99.4 F (37.4 C), temperature source Axillary, resp. rate 18, height 5' 1.5" (1.562 m), weight 65.5 kg (144 lb 6.4 oz), SpO2 100 %. No results found. Results for orders placed or performed during the hospital encounter of 07/26/15 (from the past 72 hour(s))  Glucose, capillary     Status: Abnormal   Collection Time: 07/28/15 11:52 AM  Result Value Ref Range   Glucose-Capillary 116 (H) 65 - 99 mg/dL  Glucose, capillary     Status: None   Collection Time: 07/28/15  5:08 PM  Result Value Ref Range   Glucose-Capillary 92 65 - 99 mg/dL  Glucose, capillary     Status: Abnormal   Collection Time: 07/28/15  8:54 PM  Result Value Ref Range   Glucose-Capillary 124 (H) 65 - 99 mg/dL  Glucose, capillary     Status: None   Collection Time: 07/29/15  6:27 AM  Result Value Ref Range   Glucose-Capillary 87 65 - 99 mg/dL  Glucose, capillary     Status: Abnormal   Collection Time: 07/29/15 12:02 PM  Result Value Ref Range   Glucose-Capillary 134 (H) 65 - 99 mg/dL   Comment 1 Notify RN   Glucose, capillary     Status: None   Collection Time: 07/29/15  4:25 PM  Result Value Ref Range   Glucose-Capillary 83 65 - 99 mg/dL   Comment 1 Notify RN   Renal function panel     Status: Abnormal   Collection Time: 07/29/15 10:15 PM  Result Value Ref Range   Sodium 134 (L) 135 - 145 mmol/L   Potassium 4.1 3.5 - 5.1 mmol/L   Chloride 96 (L) 101 - 111 mmol/L   CO2 23 22 - 32 mmol/L   Glucose,  Bld 96 65 - 99 mg/dL   BUN 62 (H) 6 - 20 mg/dL   Creatinine, Ser 7.32 (H) 0.44 - 1.00 mg/dL   Calcium 8.3 (L) 8.9 - 10.3 mg/dL   Phosphorus 8.4 (H) 2.5 - 4.6 mg/dL   Albumin 2.5 (L) 3.5 - 5.0 g/dL   GFR calc non Af Amer 6 (L) >60 mL/min   GFR calc Af Amer 6 (L) >60 mL/min    Comment: (NOTE) The eGFR has been calculated using the CKD EPI equation. This calculation has not been validated in all clinical situations. eGFR's persistently <60 mL/min signify possible Chronic Kidney Disease.    Anion gap 15 5 - 15  Glucose, capillary     Status: Abnormal   Collection Time: 07/30/15  1:20 AM  Result Value Ref Range   Glucose-Capillary 103 (H) 65 - 99 mg/dL  Basic metabolic panel     Status: Abnormal   Collection Time: 07/30/15  4:55 AM  Result Value Ref Range   Sodium 134 (L) 135 - 145 mmol/L   Potassium 3.9 3.5 - 5.1 mmol/L   Chloride 96 (L) 101 - 111 mmol/L   CO2 27 22 - 32 mmol/L   Glucose, Bld 99 65 - 99 mg/dL   BUN 17 6 - 20 mg/dL   Creatinine,  Ser 3.47 (H) 0.44 - 1.00 mg/dL    Comment: DELTA CHECK NOTED   Calcium 8.2 (L) 8.9 - 10.3 mg/dL   GFR calc non Af Amer 14 (L) >60 mL/min   GFR calc Af Amer 16 (L) >60 mL/min    Comment: (NOTE) The eGFR has been calculated using the CKD EPI equation. This calculation has not been validated in all clinical situations. eGFR's persistently <60 mL/min signify possible Chronic Kidney Disease.    Anion gap 11 5 - 15  CBC with Differential/Platelet     Status: Abnormal   Collection Time: 07/30/15  4:55 AM  Result Value Ref Range   WBC 16.1 (H) 4.0 - 10.5 K/uL   RBC 3.82 (L) 3.87 - 5.11 MIL/uL   Hemoglobin 10.9 (L) 12.0 - 15.0 g/dL   HCT 34.4 (L) 36.0 - 46.0 %   MCV 90.1 78.0 - 100.0 fL   MCH 28.5 26.0 - 34.0 pg   MCHC 31.7 30.0 - 36.0 g/dL   RDW 16.2 (H) 11.5 - 15.5 %   Platelets 84 (L) 150 - 400 K/uL    Comment: SPECIMEN CHECKED FOR CLOTS REPEATED TO VERIFY PLATELET COUNT CONFIRMED BY SMEAR    Neutrophils Relative % 73 %    Lymphocytes Relative 7 %   Monocytes Relative 18 %   Eosinophils Relative 2 %   Basophils Relative 0 %   Neutro Abs 11.8 (H) 1.7 - 7.7 K/uL   Lymphs Abs 1.1 0.7 - 4.0 K/uL   Monocytes Absolute 2.9 (H) 0.1 - 1.0 K/uL   Eosinophils Absolute 0.3 0.0 - 0.7 K/uL   Basophils Absolute 0.0 0.0 - 0.1 K/uL   WBC Morphology MILD LEFT SHIFT (1-5% METAS, OCC MYELO, OCC BANDS)   Glucose, capillary     Status: None   Collection Time: 07/30/15  7:02 AM  Result Value Ref Range   Glucose-Capillary 84 65 - 99 mg/dL  Glucose, capillary     Status: Abnormal   Collection Time: 07/30/15 11:51 AM  Result Value Ref Range   Glucose-Capillary 101 (H) 65 - 99 mg/dL  Glucose, capillary     Status: None   Collection Time: 07/30/15  4:28 PM  Result Value Ref Range   Glucose-Capillary 97 65 - 99 mg/dL  Glucose, capillary     Status: Abnormal   Collection Time: 07/30/15  9:11 PM  Result Value Ref Range   Glucose-Capillary 126 (H) 65 - 99 mg/dL  Glucose, capillary     Status: None   Collection Time: 07/31/15  1:38 AM  Result Value Ref Range   Glucose-Capillary 95 65 - 99 mg/dL  CBC with Differential/Platelet     Status: Abnormal   Collection Time: 07/31/15  5:11 AM  Result Value Ref Range   WBC 14.2 (H) 4.0 - 10.5 K/uL   RBC 3.80 (L) 3.87 - 5.11 MIL/uL   Hemoglobin 10.8 (L) 12.0 - 15.0 g/dL   HCT 34.2 (L) 36.0 - 46.0 %   MCV 90.0 78.0 - 100.0 fL   MCH 28.4 26.0 - 34.0 pg   MCHC 31.6 30.0 - 36.0 g/dL   RDW 16.2 (H) 11.5 - 15.5 %   Platelets 79 (L) 150 - 400 K/uL    Comment: REPEATED TO VERIFY SPECIMEN CHECKED FOR CLOTS PLATELET COUNT CONFIRMED BY SMEAR    Neutrophils Relative % 75 %   Lymphocytes Relative 14 %   Monocytes Relative 9 %   Eosinophils Relative 2 %   Basophils Relative 0 %  Band Neutrophils 0 %   Metamyelocytes Relative 0 %   Myelocytes 0 %   Promyelocytes Absolute 0 %   Blasts 0 %   nRBC 0 0 /100 WBC   Other 0 %   Neutro Abs 10.6 (H) 1.7 - 7.7 K/uL   Lymphs Abs 2.0 0.7 - 4.0  K/uL   Monocytes Absolute 1.3 (H) 0.1 - 1.0 K/uL   Eosinophils Absolute 0.3 0.0 - 0.7 K/uL   Basophils Absolute 0.0 0.0 - 0.1 K/uL   WBC Morphology MILD LEFT SHIFT (1-5% METAS, OCC MYELO, OCC BANDS)   Glucose, capillary     Status: None   Collection Time: 07/31/15  6:41 AM  Result Value Ref Range   Glucose-Capillary 74 65 - 99 mg/dL     Gen: NAD.  VS reviewed.  HEENT: Normocephalic, atraumatic Eyes: Conj WNL, EOMI Cardio: RRR and murmur Resp: CTA B/L and unlabored GI: BS positive and NT, ND Skin:   Intact. Warm and Dry. MSK:   Strength B/l UE 4/5 grossly Antigravity strength throughout.  Neurological: She is alert.  Patient disoriented. Lacks insight and awareness of deficits. Able to follow simple motor commands without difficulty.  Skin: Skin is warm and dry. No rash noted. She is not diaphoretic. No erythema.  Psychiatric: Her affect is blunt. Her speech is delayed. She is slowed. Cognition and memory are impaired.    Assessment/Plan: 1. 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 H/o MS with chronic paraplegia.    which require 3+ hours per day of interdisciplinary therapy in a comprehensive inpatient rehab setting. Physiatrist is providing close team supervision and 24 hour management of active medical problems listed below. Physiatrist and rehab team continue to assess barriers to discharge/monitor patient progress toward functional and medical goals. FIM: Function - Bathing Bathing activity did not occur: N/A (changed to evening bath) Position: Wheelchair/chair at sink Body parts bathed by patient: Chest, Abdomen Body parts bathed by helper: Right arm, Left arm, Front perineal area, Buttocks, Right upper leg, Left upper leg, Right lower leg, Left lower leg, Back Assist Level: 2 helpers  Function- Upper Body Dressing/Undressing What is the patient wearing?: Pull over shirt/dress Pull over shirt/dress - Perfomed by patient:  Thread/unthread right sleeve, Thread/unthread left sleeve, Put head through opening, Pull shirt over trunk Pull over shirt/dress - Perfomed by helper: Thread/unthread right sleeve, Thread/unthread left sleeve, Put head through opening, Pull shirt over trunk Assist Level: Touching or steadying assistance(Pt > 75%) Function - Lower Body Dressing/Undressing What is the patient wearing?: Underwear, Pants, Non-skid slipper socks Position: Wheelchair/chair at sink Underwear - Performed by helper: Thread/unthread right underwear leg, Thread/unthread left underwear leg, Pull underwear up/down Pants- Performed by helper: Thread/unthread right pants leg, Thread/unthread left pants leg, Pull pants up/down, Fasten/unfasten pants Non-skid slipper socks- Performed by helper: Don/doff right sock, Don/doff left sock Assist for lower body dressing: 2 Helpers  Function - Toileting Toileting activity did not occur: N/A Toileting steps completed by helper: Adjust clothing prior to toileting, Performs perineal hygiene, Adjust clothing after toileting Assist level: Two helpers  Function - Air cabin crew transfer activity did not occur: N/A Toilet transfer assistive device: Grab bar, Elevated toilet seat/BSC over toilet Mechanical lift: Stedy Assist level to toilet: Moderate assist (Pt 50 - 74%/lift or lower) Assist level from toilet: Maximal assist (Pt 25 - 49%/lift and lower)  Function - Chair/bed transfer Chair/bed transfer method: Squat pivot Chair/bed transfer assist level: 2 helpers Chair/bed transfer assistive device:  Armrests Chair/bed transfer details: Verbal cues for technique, Verbal cues for sequencing, Verbal cues for precautions/safety, Manual facilitation for weight shifting  Function - Locomotion: Wheelchair Will patient use wheelchair at discharge?: Yes Type: Manual Max wheelchair distance: 25 ft Assist Level: Touching or steadying assistance (Pt > 75%) Wheel 50 feet with 2 turns  activity did not occur: Safety/medical concerns Wheel 150 feet activity did not occur: Safety/medical concerns Turns around,maneuvers to table,bed, and toilet,negotiates 3% grade,maneuvers on rugs and over doorsills: No Function - Locomotion: Ambulation Ambulation activity did not occur: Safety/medical concerns (lethargic, unable to maintain static standing using RW > 5 sec with max A x 2 before sitting) Assistive device: Hand held assist Max distance: 4 side steps at bedside Assist level: Moderate assist (Pt 50 - 74%) Walk 10 feet activity did not occur: Safety/medical concerns Walk 50 feet with 2 turns activity did not occur: Safety/medical concerns Walk 150 feet activity did not occur: Safety/medical concerns Walk 10 feet on uneven surfaces activity did not occur: Safety/medical concerns  Function - Comprehension Comprehension: Auditory Comprehension assist level: Understands basic 25 - 49% of the time/ requires cueing 50 - 75% of the time  Function - Expression Expression: Verbal Expression assist level: Expresses basic 50 - 74% of the time/requires cueing 25 - 49% of the time. Needs to repeat parts of sentences.  Function - Social Interaction Social Interaction assist level: Interacts appropriately 25 - 49% of time - Needs frequent redirection.  Function - Problem Solving Problem solving assist level: Solves basic 25 - 49% of the time - needs direction more than half the time to initiate, plan or complete simple activities  Function - Memory Memory assist level: Recognizes or recalls 25 - 49% of the time/requires cueing 50 - 75% of the time Patient normally able to recall (first 3 days only): That he or she is in a hospital, Current season  Medical Problem List and Plan: 1. 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 H/o MS.    2.  DVT Prophylaxis/Anticoagulation: SCDs ordered 10/21. Pharmaceutical: Heparin on hold for HIT Ab.  Unlcear  as to why lab was not resulted yesterday, reordered for today.   3. Pain Management: Tylenol prn   4. Mood: LCSW to follow for evaluation and support.   5. Neuropsych: This patient is not capable of making decisions on her own behalf. 6. Skin/Wound Care: Routine pressure relief measures 7. Fluids/Electrolytes/Nutrition: Monitor I/O. 1200 cc FR.  Labs with HD.   8. 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    9. HTN: Need to prevent hypotension to prevent recurrent watershed infarcts.  Labile pressures, due to husband holding medications.  Required PRN overnight.  Will cont to monitor and educate pt's husband regarding holding medications.   10. ESRD: Has history of orhtostatic changes.   11. OSA: Continue CPAP at bedtime/sleeping.   12. E coli Sepsis: continue Rocephin.   13. Arrthymias:  Husband refusing Coreg--he holds her BP meds at home if SBP < 180. He has refused multiple doses of coreg. Relayed reported episodes of  asymptomatic VT and need to continue coreg. Will set hold parameter on Cardura.   14. DM type 2: Husband does not want BS covered if under 120.  Will monitor BS ac/hs. BS well controlled off lantus. Will continue SSI for now.  Fasting FSBG 74 this AM. 15. COPD: Uses oxygen prn with activity.  16. Anemia: Hb 10.8 on 10/22. Stable 17.  Leukocytosis: Trending drown.  Ordered UA, Ucx.  Labs today improved.  Will cont to monitor. 18. Thrombocytopenia: Trending down. Plts 79 on 10/22.  Will order HIT Ab due to recent decline.   LOS (Days) 5 A FACE TO FACE EVALUATION WAS PERFORMED  Ankit Lorie Phenix 07/31/2015, 9:29 AM

## 2015-07-31 NOTE — Progress Notes (Signed)
Speech Language Pathology Daily Session Note  Patient Details  Name: Gail Chapman MRN: [id number] Date of Birth: 07/23/1956  Today's Date: 07/31/2015 SLP Individual Time: 1430-1530 SLP Individual Time Calculation (min): 60 min  Short Term Goals: Week 1: SLP Short Term Goal 1 (Week 1): Pt will sustain attention to functional task for 2 minutes with Max cues SLP Short Term Goal 2 (Week 1): Pt will utilize external memory aids to facilitate recall of new and daily information with Max cues SLP Short Term Goal 3 (Week 1): Pt will verbalize at least 1 physical and 2 cognitive deficits with Max cues SLP Short Term Goal 4 (Week 1): Pt will complete basic problem solving tasks with 80% accuracy with Max cues SLP Short Term Goal 5 (Week 1): Pt will initiate functional tasks with no more than 2 verbal cues in 50% of opportunities SLP Short Term Goal 6 (Week 1): Pt will consume her currenty prescribed diet with min verbal cues for use of swallowing precautions and minimal overt s/s of aspiration.   Skilled Therapeutic Interventions:  Pt was seen for skilled ST targeting goals for cognition and dysphagia.  Pt was observed while consuming presentations of her currently prescribed diet.  Pt demonstrated slightly improved initiation for tray set up with mod assist verbal, visual, and tactile cues from SLP; however, as meal progressed pt required up to max assist for initiation of self feeding.  Pt continues to demonstrate prolonged mastication of solids due to poor attention to and awareness of boluses.  Delays in oral phase were slightly improved with mod-max cues to alternate between solids and liquids but pt continues to demonstrate poor carryover of skills for functional gains.  As a result, recommend downgrading pt's diet to dys 2 textures with continued thin liquids and full supervision for use of swallowing precautions.  RN made aware.  Pt returned to room and left in bed with bed alarm activated and all  needs left within reach.  Continue per current plan of care.    Function:  Eating Eating   Modified Consistency Diet: Yes Eating Assist Level: Set up assist for;Supervision or verbal cues;Help managing cup/glass;Helper brings food to mouth;Helper scoops food on utensil;Help with picking up utensils   Eating Set Up Assist For: Opening containers;Cutting food Helper Scoops Food on Utensil: Occasionally Helper Brings Food to Mouth: Occasionally   Cognition Comprehension Comprehension assist level: Understands basic 25 - 49% of the time/ requires cueing 50 - 75% of the time  Expression   Expression assist level: Expresses basic 50 - 74% of the time/requires cueing 25 - 49% of the time. Needs to repeat parts of sentences.  Social Interaction Social Interaction assist level: Interacts appropriately 25 - 49% of time - Needs frequent redirection.  Problem Solving Problem solving assist level: Solves basic 25 - 49% of the time - needs direction more than half the time to initiate, plan or complete simple activities  Memory Memory assist level: Recognizes or recalls 25 - 49% of the time/requires cueing 50 - 75% of the time    Pain Pain Assessment Pain Assessment: No/denies pain  Therapy/Group: Individual Therapy  Rashan Patient, Melanee Spry 07/31/2015, 4:06 PM

## 2015-08-01 LAB — GLUCOSE, CAPILLARY
GLUCOSE-CAPILLARY: 82 mg/dL (ref 65–99)
Glucose-Capillary: 108 mg/dL — ABNORMAL HIGH (ref 65–99)
Glucose-Capillary: 89 mg/dL (ref 65–99)
Glucose-Capillary: 118 mg/dL — ABNORMAL HIGH (ref 65–99)

## 2015-08-01 LAB — HEPARIN INDUCED PLATELET AB (HIT ANTIBODY): HEPARIN INDUCED PLT AB: 0.75 {OD_unit} — AB (ref 0.000–0.400)

## 2015-08-01 MED ORDER — APIXABAN 2.5 MG PO TABS
2.5000 mg | ORAL_TABLET | Freq: Two times a day (BID) | ORAL | Status: DC
  Filled 2015-08-01: qty 1

## 2015-08-01 NOTE — Plan of Care (Signed)
Problem: RH BOWEL ELIMINATION Goal: RH STG MANAGE BOWEL WITH ASSISTANCE STG Manage Bowel with Max Assistance.  Pt having incontinent stools @ times  Problem: RH BLADDER ELIMINATION Goal: RH STG MANAGE BLADDER WITH ASSISTANCE STG Manage Bladder With Mod Assistance  Dialysis, Oliguric

## 2015-08-01 NOTE — Progress Notes (Signed)
[facility] Progress Note  Subjective/Complaints: Patient seen and examined this morning resting comfortably in bed with husband at bedside.  ROS limited due to cognition.  However appears to deny CP, SOB, n/v/d.   Objective: Vital Signs: Blood pressure 162/64, pulse 70, temperature 98.6 F (37 C), temperature source Oral, resp. rate 16, height 5' 1.5" (1.562 m), weight 66.3 kg (146 lb 2.6 oz), SpO2 100 %. No results found. Results for orders placed or performed during the hospital encounter of 07/26/15 (from the past 72 hour(s))  Glucose, capillary     Status: Abnormal   Collection Time: 07/29/15 12:02 PM  Result Value Ref Range   Glucose-Capillary 134 (H) 65 - 99 mg/dL   Comment 1 Notify RN   Glucose, capillary     Status: None   Collection Time: 07/29/15  4:25 PM  Result Value Ref Range   Glucose-Capillary 83 65 - 99 mg/dL   Comment 1 Notify RN   Renal function panel     Status: Abnormal   Collection Time: 07/29/15 10:15 PM  Result Value Ref Range   Sodium 134 (L) 135 - 145 mmol/L   Potassium 4.1 3.5 - 5.1 mmol/L   Chloride 96 (L) 101 - 111 mmol/L   CO2 23 22 - 32 mmol/L   Glucose, Bld 96 65 - 99 mg/dL   BUN 62 (H) 6 - 20 mg/dL   Creatinine, Ser 7.32 (H) 0.44 - 1.00 mg/dL   Calcium 8.3 (L) 8.9 - 10.3 mg/dL   Phosphorus 8.4 (H) 2.5 - 4.6 mg/dL   Albumin 2.5 (L) 3.5 - 5.0 g/dL   GFR calc non Af Amer 6 (L) >60 mL/min   GFR calc Af Amer 6 (L) >60 mL/min    Comment: (NOTE) The eGFR has been calculated using the CKD EPI equation. This calculation has not been validated in all clinical situations. eGFR's persistently <60 mL/min signify possible Chronic Kidney Disease.    Anion gap 15 5 - 15  Glucose, capillary     Status: Abnormal   Collection Time: 07/30/15  1:20 AM  Result Value Ref Range   Glucose-Capillary 103 (H) 65 - 99 mg/dL  Basic metabolic panel     Status: Abnormal   Collection Time: 07/30/15  4:55 AM  Result Value Ref Range    Sodium 134 (L) 135 - 145 mmol/L   Potassium 3.9 3.5 - 5.1 mmol/L   Chloride 96 (L) 101 - 111 mmol/L   CO2 27 22 - 32 mmol/L   Glucose, Bld 99 65 - 99 mg/dL   BUN 17 6 - 20 mg/dL   Creatinine, Ser 3.47 (H) 0.44 - 1.00 mg/dL    Comment: DELTA CHECK NOTED   Calcium 8.2 (L) 8.9 - 10.3 mg/dL   GFR calc non Af Amer 14 (L) >60 mL/min   GFR calc Af Amer 16 (L) >60 mL/min    Comment: (NOTE) The eGFR has been calculated using the CKD EPI equation. This calculation has not been validated in all clinical situations. eGFR's persistently <60 mL/min signify possible Chronic Kidney Disease.    Anion gap 11 5 - 15  CBC with Differential/Platelet     Status: Abnormal   Collection Time: 07/30/15  4:55 AM  Result Value Ref Range   WBC 16.1 (H) 4.0 - 10.5 K/uL   RBC 3.82 (L) 3.87 - 5.11 MIL/uL   Hemoglobin 10.9 (L) 12.0 - 15.0 g/dL   HCT 34.4 (L) 36.0 - 46.0 %   MCV  90.1 78.0 - 100.0 fL   MCH 28.5 26.0 - 34.0 pg   MCHC 31.7 30.0 - 36.0 g/dL   RDW 16.2 (H) 11.5 - 15.5 %   Platelets 84 (L) 150 - 400 K/uL    Comment: SPECIMEN CHECKED FOR CLOTS REPEATED TO VERIFY PLATELET COUNT CONFIRMED BY SMEAR    Neutrophils Relative % 73 %   Lymphocytes Relative 7 %   Monocytes Relative 18 %   Eosinophils Relative 2 %   Basophils Relative 0 %   Neutro Abs 11.8 (H) 1.7 - 7.7 K/uL   Lymphs Abs 1.1 0.7 - 4.0 K/uL   Monocytes Absolute 2.9 (H) 0.1 - 1.0 K/uL   Eosinophils Absolute 0.3 0.0 - 0.7 K/uL   Basophils Absolute 0.0 0.0 - 0.1 K/uL   WBC Morphology MILD LEFT SHIFT (1-5% METAS, OCC MYELO, OCC BANDS)   Glucose, capillary     Status: None   Collection Time: 07/30/15  7:02 AM  Result Value Ref Range   Glucose-Capillary 84 65 - 99 mg/dL  Heparin induced platelet Ab (HIT antibody)     Status: Abnormal   Collection Time: 07/30/15 11:24 AM  Result Value Ref Range   Heparin Induced Plt Ab 1.076 (H) 0.000 - 0.400 OD    Comment: (NOTE) Performed At: [facility] [place], [place]  [id number] Lindon Romp MD RA:[id number]   Glucose, capillary     Status: Abnormal   Collection Time: 07/30/15 11:51 AM  Result Value Ref Range   Glucose-Capillary 101 (H) 65 - 99 mg/dL  Glucose, capillary     Status: None   Collection Time: 07/30/15  4:28 PM  Result Value Ref Range   Glucose-Capillary 97 65 - 99 mg/dL  Glucose, capillary     Status: Abnormal   Collection Time: 07/30/15  9:11 PM  Result Value Ref Range   Glucose-Capillary 126 (H) 65 - 99 mg/dL  Glucose, capillary     Status: None   Collection Time: 07/31/15  1:38 AM  Result Value Ref Range   Glucose-Capillary 95 65 - 99 mg/dL  CBC with Differential/Platelet     Status: Abnormal   Collection Time: 07/31/15  5:11 AM  Result Value Ref Range   WBC 14.2 (H) 4.0 - 10.5 K/uL   RBC 3.80 (L) 3.87 - 5.11 MIL/uL   Hemoglobin 10.8 (L) 12.0 - 15.0 g/dL   HCT 34.2 (L) 36.0 - 46.0 %   MCV 90.0 78.0 - 100.0 fL   MCH 28.4 26.0 - 34.0 pg   MCHC 31.6 30.0 - 36.0 g/dL   RDW 16.2 (H) 11.5 - 15.5 %   Platelets 79 (L) 150 - 400 K/uL    Comment: REPEATED TO VERIFY SPECIMEN CHECKED FOR CLOTS PLATELET COUNT CONFIRMED BY SMEAR    Neutrophils Relative % 75 %   Lymphocytes Relative 14 %   Monocytes Relative 9 %   Eosinophils Relative 2 %   Basophils Relative 0 %   Band Neutrophils 0 %   Metamyelocytes Relative 0 %   Myelocytes 0 %   Promyelocytes Absolute 0 %   Blasts 0 %   nRBC 0 0 /100 WBC   Other 0 %   Neutro Abs 10.6 (H) 1.7 - 7.7 K/uL   Lymphs Abs 2.0 0.7 - 4.0 K/uL   Monocytes Absolute 1.3 (H) 0.1 - 1.0 K/uL   Eosinophils Absolute 0.3 0.0 - 0.7 K/uL   Basophils Absolute 0.0 0.0 - 0.1 K/uL   WBC Morphology MILD  LEFT SHIFT (1-5% METAS, OCC MYELO, OCC BANDS)   Glucose, capillary     Status: None   Collection Time: 07/31/15  6:41 AM  Result Value Ref Range   Glucose-Capillary 74 65 - 99 mg/dL  Glucose, capillary     Status: None   Collection Time: 07/31/15 12:18 PM  Result Value Ref Range   Glucose-Capillary 92  65 - 99 mg/dL  Glucose, capillary     Status: Abnormal   Collection Time: 07/31/15  4:30 PM  Result Value Ref Range   Glucose-Capillary 110 (H) 65 - 99 mg/dL  Renal function panel     Status: Abnormal   Collection Time: 07/31/15  6:47 PM  Result Value Ref Range   Sodium 135 135 - 145 mmol/L   Potassium 4.4 3.5 - 5.1 mmol/L   Chloride 97 (L) 101 - 111 mmol/L   CO2 23 22 - 32 mmol/L   Glucose, Bld 141 (H) 65 - 99 mg/dL   BUN 49 (H) 6 - 20 mg/dL   Creatinine, Ser 6.21 (H) 0.44 - 1.00 mg/dL    Comment: DELTA CHECK NOTED   Calcium 8.8 (L) 8.9 - 10.3 mg/dL   Phosphorus 8.4 (H) 2.5 - 4.6 mg/dL   Albumin 2.5 (L) 3.5 - 5.0 g/dL   GFR calc non Af Amer 7 (L) >60 mL/min   GFR calc Af Amer 8 (L) >60 mL/min    Comment: (NOTE) The eGFR has been calculated using the CKD EPI equation. This calculation has not been validated in all clinical situations. eGFR's persistently <60 mL/min signify possible Chronic Kidney Disease.    Anion gap 15 5 - 15  CBC     Status: Abnormal   Collection Time: 07/31/15  6:48 PM  Result Value Ref Range   WBC 12.0 (H) 4.0 - 10.5 K/uL   RBC 3.76 (L) 3.87 - 5.11 MIL/uL   Hemoglobin 10.4 (L) 12.0 - 15.0 g/dL   HCT 34.0 (L) 36.0 - 46.0 %   MCV 90.4 78.0 - 100.0 fL   MCH 27.7 26.0 - 34.0 pg   MCHC 30.6 30.0 - 36.0 g/dL   RDW 16.1 (H) 11.5 - 15.5 %   Platelets 90 (L) 150 - 400 K/uL    Comment: CONSISTENT WITH PREVIOUS RESULT  Glucose, capillary     Status: Abnormal   Collection Time: 07/31/15 11:31 PM  Result Value Ref Range   Glucose-Capillary 165 (H) 65 - 99 mg/dL  Glucose, capillary     Status: None   Collection Time: 08/01/15  6:31 AM  Result Value Ref Range   Glucose-Capillary 82 65 - 99 mg/dL     Gen: NAD.  VS reviewed.  HEENT: Normocephalic, atraumatic Eyes: Conj WNL, EOMI Cardio: RRR and murmur Resp: CTA B/L and unlabored GI: BS positive and NT, ND Skin:   Intact. Warm and Dry. MSK:   Antigravity strength throughout.   Neurological: She is  alert.  Patient disoriented. Lacks insight and awareness of deficits. Able to follow simple motor commands without difficulty.  Skin: Skin is warm and dry. No rash noted. She is not diaphoretic. No erythema.  Psychiatric: Her affect is blunt. Her speech is delayed. She is slowed. Cognition and memory are impaired.    Assessment/Plan: 1. 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 H/o MS with chronic paraplegia.    which require 3+ hours per day of interdisciplinary therapy in a comprehensive inpatient rehab setting. Physiatrist is providing close team supervision and  24 hour management of active medical problems listed below. Physiatrist and rehab team continue to assess barriers to discharge/monitor patient progress toward functional and medical goals. FIM: Function - Bathing Bathing activity did not occur: N/A (changed to evening bath) Position: Wheelchair/chair at sink Body parts bathed by patient: Chest, Abdomen Body parts bathed by helper: Right arm, Left arm, Front perineal area, Buttocks, Right upper leg, Left upper leg, Right lower leg, Left lower leg, Back Assist Level: 2 helpers  Function- Upper Body Dressing/Undressing What is the patient wearing?: [place] over shirt/dress - Perfomed by patient: Thread/unthread right sleeve, Thread/unthread left sleeve, Put head through opening, Pull shirt over trunk Pull over shirt/dress - Perfomed by helper: Thread/unthread right sleeve, Thread/unthread left sleeve, Put head through opening, Pull shirt over trunk Assist Level: Touching or steadying assistance(Pt > 75%) Function - Lower Body Dressing/Undressing What is the patient wearing?: Hospital Gown Position: Wheelchair/chair at sink Underwear - Performed by helper: Thread/unthread right underwear leg, Thread/unthread left underwear leg, Pull underwear up/down Pants- Performed by helper: Thread/unthread right pants leg,  Thread/unthread left pants leg, Pull pants up/down, Fasten/unfasten pants Non-skid slipper socks- Performed by helper: Don/doff right sock, Don/doff left sock Assist for lower body dressing: 2 Helpers  Function - Toileting Toileting activity did not occur: N/A Toileting steps completed by helper: Adjust clothing prior to toileting, Performs perineal hygiene, Adjust clothing after toileting Assist level: Two helpers  Function - Air cabin crew transfer activity did not occur: N/A Toilet transfer assistive device: Grab bar, Elevated toilet seat/BSC over toilet Mechanical lift: Stedy Assist level to toilet: Moderate assist (Pt 50 - 74%/lift or lower) Assist level from toilet: Maximal assist (Pt 25 - 49%/lift and lower)  Function - Chair/bed transfer Chair/bed transfer method: Squat pivot Chair/bed transfer assist level: 2 helpers Chair/bed transfer assistive device: Armrests Chair/bed transfer details: Verbal cues for technique, Verbal cues for sequencing, Verbal cues for precautions/safety, Manual facilitation for weight shifting  Function - Locomotion: Wheelchair Will patient use wheelchair at discharge?: Yes Type: Manual Max wheelchair distance: 20' + 20' Assist Level: Total assistance (Pt < 25%) Wheel 50 feet with 2 turns activity did not occur: Safety/medical concerns Wheel 150 feet activity did not occur: Safety/medical concerns Turns around,maneuvers to table,bed, and toilet,negotiates 3% grade,maneuvers on rugs and over doorsills: No Function - Locomotion: Ambulation Ambulation activity did not occur: Safety/medical concerns (lethargic, unable to maintain static standing using RW > 5 sec with max A x 2 before sitting) Assistive device: Hand held assist Max distance: 4 side steps at bedside Assist level: Moderate assist (Pt 50 - 74%) Walk 10 feet activity did not occur: Safety/medical concerns Walk 50 feet with 2 turns activity did not occur: Safety/medical  concerns Walk 150 feet activity did not occur: Safety/medical concerns Walk 10 feet on uneven surfaces activity did not occur: Safety/medical concerns  Function - Comprehension Comprehension: Auditory Comprehension assist level: Understands basic 25 - 49% of the time/ requires cueing 50 - 75% of the time  Function - Expression Expression: Verbal Expression assist level: Expresses basic 50 - 74% of the time/requires cueing 25 - 49% of the time. Needs to repeat parts of sentences.  Function - Social Interaction Social Interaction assist level: Interacts appropriately 25 - 49% of time - Needs frequent redirection.  Function - Problem Solving Problem solving assist level: Solves basic 25 - 49% of the time - needs direction more than half the time to initiate, plan or complete simple activities  Function - Memory Memory  assist level: Recognizes or recalls 25 - 49% of the time/requires cueing 50 - 75% of the time Patient normally able to recall (first 3 days only): That he or she is in a hospital, Current season  Medical Problem List and Plan: 1. 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 H/o MS.    2.  DVT Prophylaxis/Anticoagulation: SCDs ordered 10/21. 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 3. Pain Management: Tylenol prn   4. Mood: LCSW to follow for evaluation and support.   5. Neuropsych: This patient is not capable of making decisions on her own behalf. 6. Skin/Wound Care: Routine pressure relief measures 7. Fluids/Electrolytes/Nutrition: Monitor I/O. 1200 cc FR.  Labs with HD.   8. 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    9. HTN: Need to prevent hypotension to prevent recurrent watershed infarcts.  Labile pressures, due to husband holding medications.  Required PRN overnight.  Will cont to monitor and educate pt's husband regarding  holding medications.   10. ESRD: Has history of orhtostatic changes.   11. OSA: Continue CPAP at bedtime/sleeping.   12. E coli Sepsis: continue Rocephin.   13. Arrthymias:  Husband refusing Coreg--he holds her BP meds at home if SBP < 180. He has refused multiple doses of coreg. Relayed reported episodes of  asymptomatic VT and need to continue coreg. Will set hold parameter on Cardura.   14. DM type 2: Husband does not want BS covered if under 120.  Will monitor BS ac/hs. BS well controlled off lantus. Will continue SSI for now.  Fasting FSBG 82 this AM. 15. COPD: Uses oxygen prn with activity.  16. Anemia: Hb 10.4 on 10/22. Stable 17. Leukocytosis: Trending drown.  Labs today improved.  Will cont to monitor. 18. Thrombocytopenia: Trending down. Plts 90 on 10/22.  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  Heparin d/ced, will cont to monitor.   LOS (Days) 6 A FACE TO FACE EVALUATION WAS PERFORMED  Sabine Tenenbaum Lorie Phenix 08/01/2015, 9:36 AM

## 2015-08-01 NOTE — Progress Notes (Signed)
Pt placed on cpap at this time 

## 2015-08-01 NOTE — Progress Notes (Signed)
Pt is on CPAP at this time, no distress or complications noted. RN aware and at bedside.

## 2015-08-02 ENCOUNTER — Inpatient Hospital Stay (HOSPITAL_COMMUNITY): Payer: Medicare Other | Admitting: Speech Pathology

## 2015-08-02 ENCOUNTER — Inpatient Hospital Stay (HOSPITAL_COMMUNITY): Payer: Medicare Other | Admitting: Occupational Therapy

## 2015-08-02 ENCOUNTER — Inpatient Hospital Stay (HOSPITAL_COMMUNITY): Payer: Medicare Other

## 2015-08-02 LAB — GLUCOSE, CAPILLARY
GLUCOSE-CAPILLARY: 89 mg/dL (ref 65–99)
Glucose-Capillary: 91 mg/dL (ref 65–99)
Glucose-Capillary: 80 mg/dL (ref 65–99)
Glucose-Capillary: 94 mg/dL (ref 65–99)

## 2015-08-02 MED ORDER — RENA-VITE PO TABS
1.0000 | ORAL_TABLET | Freq: Every day | ORAL | Status: DC
  Administered 2015-08-02 – 2015-08-06 (×4): 1 via ORAL
  Filled 2015-08-02 (×5): qty 1

## 2015-08-02 NOTE — Progress Notes (Signed)
Occupational Therapy Session Note  Patient Details  Name: Gail Chapman MRN: [id number] Date of Birth: 02-Aug-1956  Today's Date: 08/02/2015 OT Individual Time: 1435-1505 OT Individual Time Calculation (min): 30 min    Short Term Goals: Week 1:  OT Short Term Goal 1 (Week 1): Pt will wash UB with min A. OT Short Term Goal 2 (Week 1): Pt will don shirt with max A.  OT Short Term Goal 3 (Week 1): Pt will transfer to toilet with squat pivot of max A x1.  OT Short Term Goal 4 (Week 1): Pt will actively use left arm to wash R arm with min A. OT Short Term Goal 5 (Week 1): Pt will find items on her left side with mod cues.  Skilled Therapeutic Interventions/Progress Updates:    Treatment session with focus on attention to task, sequencing, initiation with novel task.  Engaged in matching activity with focus on attention, sequencing, and initiation with pt requiring max question cues to promote sequencing.  Noted mild perseveration with drawing cards and decreased problem solving.  Therapy Documentation Precautions:  Precautions Precautions: Fall Precaution Comments: hx of MS with baseline visual changes Restrictions Weight Bearing Restrictions: No General:   Vital Signs: Therapy Vitals Temp: 98 F (36.7 C) Temp Source: Oral Pulse Rate: 78 Resp: 18 BP: (!) 181/79 mmHg Patient Position (if appropriate): Sitting Oxygen Therapy SpO2: 98 % O2 Device: Not Delivered Pain: Pain Assessment Pain Assessment: No/denies pain  See Function Navigator for Current Functional Status.   Therapy/Group: Individual Therapy  Rosalio Loud 08/02/2015, 3:13 PM

## 2015-08-02 NOTE — Plan of Care (Signed)
Problem: RH COGNITION-NURSING Goal: RH STG ANTICIPATES NEEDS/CALLS FOR ASSIST W/ASSIST/CUES STG Anticipates Needs/Calls for Assist With Min Assistance/Cues.  Outcome: Not Progressing Patient does not call for assist. Patient does not get up without assist. Patient requires every 1 checks.   Problem: RH KNOWLEDGE DEFICIT Goal: RH STG INCREASE KNOWLEDGE OF DIABETES Patient/caregiver will verbalize increase understanding of diabetes, medications, and diet prior to D/C with Min assist from staff.  Outcome: Progressing Husband able to verbalize greater understanding of diabetes and medications than patient. Goal: RH STG INCREASE KNOWLEDGE OF HYPERTENSION Patient/caregiver will verbalize understanding about hypertension, medications, and diet prior to discharge with min assist from staff.  Outcome: Progressing Husband very verbal of understanding of HTN and medications. Often requests not to give medications to patient . Goal: RH STG INCREASE KNOWLEDGE OF DYSPHAGIA/FLUID INTAKE Patient/caregiver will verbalize increased knowledge of dysphagia diet/fluid intake prior to d/c with min assist from staff.  Outcome: Progressing Husband able to verbalize precautions and swallowing strategies that were used prior to patient entering hospital.

## 2015-08-02 NOTE — Progress Notes (Signed)
Patient and pts husband refused night bath. Pravastatin refused as well. Educated on importance of medication with pt husband continuing to refuse medication.

## 2015-08-02 NOTE — Progress Notes (Signed)
[facility] Progress Note  Subjective/Complaints: Patient seen and examined this morning resting comfortably in bed with husband at bedside. Patient denies complaints. Her husband continues to refuse medication at certain times. She would like to know when she the home.  ROS limited due to cognition.  However appears to deny CP, SOB, n/v/d.   Objective: Vital Signs: Blood pressure 168/75, pulse 72, temperature 99 F (37.2 C), temperature source Axillary, resp. rate 18, height 5' 1.5" (1.562 m), weight 66.3 kg (146 lb 2.6 oz), SpO2 100 %. No results found. Results for orders placed or performed during the hospital encounter of 07/26/15 (from the past 72 hour(s))  Heparin induced platelet Ab (HIT antibody)     Status: Abnormal   Collection Time: 07/30/15 11:24 AM  Result Value Ref Range   Heparin Induced Plt Ab 1.076 (H) 0.000 - 0.400 OD    Comment: (NOTE) Performed At: [facility] [place], [place] [id number] Lindon Romp MD NH:[id number]   Glucose, capillary     Status: Abnormal   Collection Time: 07/30/15 11:51 AM  Result Value Ref Range   Glucose-Capillary 101 (H) 65 - 99 mg/dL  Glucose, capillary     Status: None   Collection Time: 07/30/15  4:28 PM  Result Value Ref Range   Glucose-Capillary 97 65 - 99 mg/dL  Glucose, capillary     Status: Abnormal   Collection Time: 07/30/15  9:11 PM  Result Value Ref Range   Glucose-Capillary 126 (H) 65 - 99 mg/dL  Glucose, capillary     Status: None   Collection Time: 07/31/15  1:38 AM  Result Value Ref Range   Glucose-Capillary 95 65 - 99 mg/dL  CBC with Differential/Platelet     Status: Abnormal   Collection Time: 07/31/15  5:11 AM  Result Value Ref Range   WBC 14.2 (H) 4.0 - 10.5 K/uL   RBC 3.80 (L) 3.87 - 5.11 MIL/uL   Hemoglobin 10.8 (L) 12.0 - 15.0 g/dL   HCT 34.2 (L) 36.0 - 46.0 %   MCV 90.0 78.0 - 100.0 fL   MCH 28.4 26.0 - 34.0 pg   MCHC 31.6 30.0 - 36.0 g/dL    RDW 16.2 (H) 11.5 - 15.5 %   Platelets 79 (L) 150 - 400 K/uL    Comment: REPEATED TO VERIFY SPECIMEN CHECKED FOR CLOTS PLATELET COUNT CONFIRMED BY SMEAR    Neutrophils Relative % 75 %   Lymphocytes Relative 14 %   Monocytes Relative 9 %   Eosinophils Relative 2 %   Basophils Relative 0 %   Band Neutrophils 0 %   Metamyelocytes Relative 0 %   Myelocytes 0 %   Promyelocytes Absolute 0 %   Blasts 0 %   nRBC 0 0 /100 WBC   Other 0 %   Neutro Abs 10.6 (H) 1.7 - 7.7 K/uL   Lymphs Abs 2.0 0.7 - 4.0 K/uL   Monocytes Absolute 1.3 (H) 0.1 - 1.0 K/uL   Eosinophils Absolute 0.3 0.0 - 0.7 K/uL   Basophils Absolute 0.0 0.0 - 0.1 K/uL   WBC Morphology MILD LEFT SHIFT (1-5% METAS, OCC MYELO, OCC BANDS)   Glucose, capillary     Status: None   Collection Time: 07/31/15  6:41 AM  Result Value Ref Range   Glucose-Capillary 74 65 - 99 mg/dL  Heparin induced platelet Ab (HIT antibody)     Status: Abnormal   Collection Time: 07/31/15 10:46 AM  Result Value Ref Range  Heparin Induced Plt Ab 0.750 (H) 0.000 - 0.400 OD    Comment: (NOTE) Performed At: [facility] [address] [place], [place] [id number] Lindon Romp MD MS:[id number]   Glucose, capillary     Status: None   Collection Time: 07/31/15 12:18 PM  Result Value Ref Range   Glucose-Capillary 92 65 - 99 mg/dL  Glucose, capillary     Status: Abnormal   Collection Time: 07/31/15  4:30 PM  Result Value Ref Range   Glucose-Capillary 110 (H) 65 - 99 mg/dL  Renal function panel     Status: Abnormal   Collection Time: 07/31/15  6:47 PM  Result Value Ref Range   Sodium 135 135 - 145 mmol/L   Potassium 4.4 3.5 - 5.1 mmol/L   Chloride 97 (L) 101 - 111 mmol/L   CO2 23 22 - 32 mmol/L   Glucose, Bld 141 (H) 65 - 99 mg/dL   BUN 49 (H) 6 - 20 mg/dL   Creatinine, Ser 6.21 (H) 0.44 - 1.00 mg/dL    Comment: DELTA CHECK NOTED   Calcium 8.8 (L) 8.9 - 10.3 mg/dL   Phosphorus 8.4 (H) 2.5 - 4.6 mg/dL   Albumin 2.5 (L) 3.5 - 5.0 g/dL    GFR calc non Af Amer 7 (L) >60 mL/min   GFR calc Af Amer 8 (L) >60 mL/min    Comment: (NOTE) The eGFR has been calculated using the CKD EPI equation. This calculation has not been validated in all clinical situations. eGFR's persistently <60 mL/min signify possible Chronic Kidney Disease.    Anion gap 15 5 - 15  CBC     Status: Abnormal   Collection Time: 07/31/15  6:48 PM  Result Value Ref Range   WBC 12.0 (H) 4.0 - 10.5 K/uL   RBC 3.76 (L) 3.87 - 5.11 MIL/uL   Hemoglobin 10.4 (L) 12.0 - 15.0 g/dL   HCT 34.0 (L) 36.0 - 46.0 %   MCV 90.4 78.0 - 100.0 fL   MCH 27.7 26.0 - 34.0 pg   MCHC 30.6 30.0 - 36.0 g/dL   RDW 16.1 (H) 11.5 - 15.5 %   Platelets 90 (L) 150 - 400 K/uL    Comment: CONSISTENT WITH PREVIOUS RESULT  Glucose, capillary     Status: Abnormal   Collection Time: 07/31/15 11:31 PM  Result Value Ref Range   Glucose-Capillary 165 (H) 65 - 99 mg/dL  Glucose, capillary     Status: None   Collection Time: 08/01/15  6:31 AM  Result Value Ref Range   Glucose-Capillary 82 65 - 99 mg/dL  Glucose, capillary     Status: Abnormal   Collection Time: 08/01/15 11:23 AM  Result Value Ref Range   Glucose-Capillary 108 (H) 65 - 99 mg/dL  Glucose, capillary     Status: None   Collection Time: 08/01/15  4:37 PM  Result Value Ref Range   Glucose-Capillary 89 65 - 99 mg/dL  Glucose, capillary     Status: Abnormal   Collection Time: 08/01/15  9:45 PM  Result Value Ref Range   Glucose-Capillary 118 (H) 65 - 99 mg/dL     Gen: NAD.  VS reviewed.  HEENT: Normocephalic, atraumatic Eyes: Conj WNL, EOMI Cardio: RRR and murmur Resp: CTA B/L and unlabored GI: BS positive and NT, ND Skin:   Intact. Warm and Dry. MSK:   4/5 grossly throughout. Neurological: She is alert.  Patient disoriented. Lacks insight and awareness of deficits. Able to follow simple motor commands without difficulty.  Skin: Skin is warm and dry. No rash noted. She is not diaphoretic. No erythema.  Psychiatric:  Her affect is blunt. Her speech is delayed. She is slowed. Cognition and memory are impaired.    Assessment/Plan: 1. 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 H/o MS with chronic paraplegia.    which require 3+ hours per day of interdisciplinary therapy in a comprehensive inpatient rehab setting. Physiatrist is providing close team supervision and 24 hour management of active medical problems listed below. Physiatrist and rehab team continue to assess barriers to discharge/monitor patient progress toward functional and medical goals. FIM: Function - Bathing Bathing activity did not occur: N/A (changed to evening bath) Position: Wheelchair/chair at sink Body parts bathed by patient: Chest, Abdomen Body parts bathed by helper: Right arm, Left arm, Front perineal area, Buttocks, Right upper leg, Left upper leg, Right lower leg, Left lower leg, Back Assist Level: 2 helpers  Function- Upper Body Dressing/Undressing What is the patient wearing?: [place] over shirt/dress - Perfomed by patient: Thread/unthread right sleeve, Thread/unthread left sleeve, Put head through opening, Pull shirt over trunk Pull over shirt/dress - Perfomed by helper: Thread/unthread right sleeve, Thread/unthread left sleeve, Put head through opening, Pull shirt over trunk Assist Level: Touching or steadying assistance(Pt > 75%) Function - Lower Body Dressing/Undressing What is the patient wearing?: Hospital Gown Position: Wheelchair/chair at sink Underwear - Performed by helper: Thread/unthread right underwear leg, Thread/unthread left underwear leg, Pull underwear up/down Pants- Performed by helper: Thread/unthread right pants leg, Thread/unthread left pants leg, Pull pants up/down, Fasten/unfasten pants Non-skid slipper socks- Performed by helper: Don/doff right sock, Don/doff left sock Assist for lower body dressing: 2 Helpers  Function - Toileting Toileting  activity did not occur: N/A Toileting steps completed by helper: Adjust clothing prior to toileting, Performs perineal hygiene, Adjust clothing after toileting Assist level: Two helpers  Function - Air cabin crew transfer activity did not occur: N/A Toilet transfer assistive device: Grab bar, Elevated toilet seat/BSC over toilet Mechanical lift: Stedy Assist level to toilet: Moderate assist (Pt 50 - 74%/lift or lower) Assist level from toilet: Maximal assist (Pt 25 - 49%/lift and lower)  Function - Chair/bed transfer Chair/bed transfer method: Squat pivot Chair/bed transfer assist level: 2 helpers Chair/bed transfer assistive device: Armrests Chair/bed transfer details: Verbal cues for technique, Verbal cues for sequencing, Verbal cues for precautions/safety, Manual facilitation for weight shifting  Function - Locomotion: Wheelchair Will patient use wheelchair at discharge?: Yes Type: Manual Max wheelchair distance: 20' + 20' Assist Level: Total assistance (Pt < 25%) Wheel 50 feet with 2 turns activity did not occur: Safety/medical concerns Wheel 150 feet activity did not occur: Safety/medical concerns Turns around,maneuvers to table,bed, and toilet,negotiates 3% grade,maneuvers on rugs and over doorsills: No Function - Locomotion: Ambulation Ambulation activity did not occur: Safety/medical concerns (lethargic, unable to maintain static standing using RW > 5 sec with max A x 2 before sitting) Assistive device: Hand held assist Max distance: 4 side steps at bedside Assist level: Moderate assist (Pt 50 - 74%) Walk 10 feet activity did not occur: Safety/medical concerns Walk 50 feet with 2 turns activity did not occur: Safety/medical concerns Walk 150 feet activity did not occur: Safety/medical concerns Walk 10 feet on uneven surfaces activity did not occur: Safety/medical concerns  Function - Comprehension Comprehension: Auditory Comprehension assist level: Understands  basic 25 - 49% of the time/ requires cueing 50 - 75% of the time  Function - Expression Expression: Verbal  Expression assist level: Expresses basic 50 - 74% of the time/requires cueing 25 - 49% of the time. Needs to repeat parts of sentences.  Function - Social Interaction Social Interaction assist level: Interacts appropriately 25 - 49% of time - Needs frequent redirection.  Function - Problem Solving Problem solving assist level: Solves basic 25 - 49% of the time - needs direction more than half the time to initiate, plan or complete simple activities  Function - Memory Memory assist level: Recognizes or recalls 25 - 49% of the time/requires cueing 50 - 75% of the time Patient normally able to recall (first 3 days only): That he or she is in a hospital, Current season  Medical Problem List and Plan: 1. 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 H/o MS.    2.  DVT Prophylaxis/Anticoagulation: SCDs ordered 10/21. 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  -Pt's husband refusing medication, will cont to educate  -SRA pending 3. Pain Management: Tylenol prn   4. Mood: LCSW to follow for evaluation and support.   5. Neuropsych: This patient is not capable of making decisions on her own behalf. 6. Skin/Wound Care: Routine pressure relief measures 7. Fluids/Electrolytes/Nutrition: Monitor I/O. 1200 cc FR.  Labs with HD.   8. 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    9. HTN: Need to prevent hypotension to prevent recurrent watershed infarcts.  Labile pressures, due to husband holding medications.  Will cont to monitor and educate pt's husband regarding holding medications.   10. ESRD: Has history of orhtostatic changes.   11. OSA: Continue CPAP at bedtime/sleeping.   12. E coli Sepsis: continue Rocephin.   13. Arrthymias:  Husband refusing  Coreg--he holds her BP meds at home if SBP < 180. He has refused multiple doses of coreg. Relayed reported episodes of  asymptomatic VT and need to continue coreg. Will set hold parameter on Cardura.   14. DM type 2: Husband does not want BS covered if under 120.  Will monitor BS ac/hs. BS well controlled off lantus. Will continue SSI for now.  Awaiting fasting FSBG AM. 15. COPD: Uses oxygen prn with activity.  16. Anemia: Hb 10.4 on 10/22. Stable 17. Leukocytosis: Trending drown.  Will cont to monitor. 18. Thrombocytopenia: Trending down. Plts 90 on 10/22.  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  Heparin d/ced, will cont to monitor.   LOS (Days) 7 A FACE TO FACE EVALUATION WAS PERFORMED  Jamin Panther Lorie Phenix 08/02/2015, 8:41 AM

## 2015-08-02 NOTE — Progress Notes (Signed)
Speech Language Pathology Daily Session Note  Patient Details  Name: Gail Chapman MRN: [id number] Date of Birth: Jan 08, 1956  Today's Date: 08/02/2015 SLP Individual Time: 1030-1130 SLP Individual Time Calculation (min): 60 min  Short Term Goals: Week 1: SLP Short Term Goal 1 (Week 1): Pt will sustain attention to functional task for 2 minutes with Max cues SLP Short Term Goal 2 (Week 1): Pt will utilize external memory aids to facilitate recall of new and daily information with Max cues SLP Short Term Goal 3 (Week 1): Pt will verbalize at least 1 physical and 2 cognitive deficits with Max cues SLP Short Term Goal 4 (Week 1): Pt will complete basic problem solving tasks with 80% accuracy with Max cues SLP Short Term Goal 5 (Week 1): Pt will initiate functional tasks with no more than 2 verbal cues in 50% of opportunities SLP Short Term Goal 6 (Week 1): Pt will consume her currenty prescribed diet with min verbal cues for use of swallowing precautions and minimal overt s/s of aspiration.   Skilled Therapeutic Interventions: Skilled treatment session focused on addressing cognition goals. SLP facilitated session by providing Max question cues to verbally sequence a 4-step basic household task, modeled sequencing with corresponding picture cards, and provided a choice of two for each step which patient completed with 50% accuracy.  Patient required more than 2 verbal cues to initiate tasks in 80% of opportunities today.  However, patient was able to tell SLP that she needed to go to the bathroom and problem solved use of call bell Mod I.  Patient then required Mod verbal cues to verbally respond to secretary when call bell was answered.  Continue with current plan of care.   Function:  Cognition Comprehension Comprehension assist level: Understands basic 25 - 49% of the time/ requires cueing 50 - 75% of the time  Expression   Expression assist level: Expresses basic 50 - 74% of the  time/requires cueing 25 - 49% of the time. Needs to repeat parts of sentences.  Social Interaction Social Interaction assist level: Interacts appropriately 25 - 49% of time - Needs frequent redirection.  Problem Solving Problem solving assist level: Solves basic less than 25% of the time - needs direction nearly all the time or does not effectively solve problems and may need a restraint for safety  Memory Memory assist level: Recognizes or recalls 25 - 49% of the time/requires cueing 50 - 75% of the time    Pain Pain Assessment Pain Assessment: No/denies pain  Therapy/Group: Individual Therapy  Charlane Ferretti., CCC-SLP [phone]  Gail Chapman 08/02/2015, 12:54 PM

## 2015-08-02 NOTE — Progress Notes (Signed)
Occupational Therapy Session Note  Patient Details  Name: Gail Chapman MRN: [id number] Date of Birth: 11/27/1955  Today's Date: 08/02/2015 OT Individual Time: 0900-1000 OT Individual Time Calculation (min): 60 min    Short Term Goals: Week 1:  OT Short Term Goal 1 (Week 1): Pt will wash UB with min A. OT Short Term Goal 2 (Week 1): Pt will don shirt with max A.  OT Short Term Goal 3 (Week 1): Pt will transfer to toilet with squat pivot of max A x1.  OT Short Term Goal 4 (Week 1): Pt will actively use left arm to wash R arm with min A. OT Short Term Goal 5 (Week 1): Pt will find items on her left side with mod cues.  Skilled Therapeutic Interventions/Progress Updates:     Pt resting in bed with husband and RN present upon arrival.  Initial focus of session was to transfer to w/c for BADL retraining.  Upon sitting EOB pt was noted to be incontinent of bowel.  Pt assisted to supine position for hygiene.  Focus transitioned to bed mobility including rolling to right and left to facilitate hygiene.  Pt required tot A for hygiene and donning LB clothing at bed level.  Pt required mod A for supine->sit EOB to don shirt while seated unsupported.  Pt required min verbal cues for sitting balance at EOB. Pt required max A for squat pivot transfer to w/c and completed grooming tasks at sink. Focus on task initiation, task completion, following one step commands, bed mobility, functional transfers, and sitting balance.  Therapy Documentation Precautions:  Precautions Precautions: Fall Precaution Comments: hx of MS with baseline visual changes Restrictions Weight Bearing Restrictions: No Pain: Pain Assessment Pain Assessment: No/denies pain  See Function Navigator for Current Functional Status.   Therapy/Group: Individual Therapy  Rich Brave 08/02/2015, 12:01 PM

## 2015-08-02 NOTE — Progress Notes (Signed)
Placed pt on cpap, pt tolerating well at this time.  

## 2015-08-02 NOTE — Progress Notes (Signed)
S: Eating OK.  Bowels moving.  Husband/pt refusing renvela O:BP 171/56 mmHg  Pulse 82  Temp(Src) 99 F (37.2 C) (Axillary)  Resp 18  Ht 5' 1.5" (1.562 m)  Wt 66.3 kg (146 lb 2.6 oz)  BMI 27.17 kg/m2  SpO2 100%  Intake/Output Summary (Last 24 hours) at 08/02/15 1112 Last data filed at 08/02/15 0800  Gross per 24 hour  Intake    360 ml  Output      0 ml  Net    360 ml   Weight change:  ZOX:WRUEA and alert CVS:RRR Resp:Clear Abd:+ BS NTND Ext:no edema NEURO: Ox3.  Ambulates very little which is not new Lt IJ PC   . antiseptic oral rinse  7 mL Mouth Rinse BID  . aspirin EC  325 mg Oral Daily  . calcitRIOL  0.25 mcg Oral Q M,W,F-HD  . carvedilol  12.5 mg Oral BID WC  . cefTRIAXone (ROCEPHIN)  IV  1 g Intravenous Q24H  . [START ON 08/04/2015] darbepoetin (ARANESP) injection - DIALYSIS  60 mcg Intravenous Q Wed-HD  . divalproex  750 mg Oral Q12H  . doxazosin  2 mg Oral QHS  . insulin aspart  0-9 Units Subcutaneous TID WC  . pantoprazole  40 mg Oral QHS  . pravastatin  40 mg Oral q1800  . sevelamer carbonate  800 mg Oral TID WC   No results found. BMET    Component Value Date/Time   NA 135 07/31/2015 1847   K 4.4 07/31/2015 1847   CL 97* 07/31/2015 1847   CO2 23 07/31/2015 1847   GLUCOSE 141* 07/31/2015 1847   BUN 49* 07/31/2015 1847   CREATININE 6.21* 07/31/2015 1847   CALCIUM 8.8* 07/31/2015 1847   GFRNONAA 7* 07/31/2015 1847   GFRAA 8* 07/31/2015 1847   CBC    Component Value Date/Time   WBC 12.0* 07/31/2015 1848   RBC 3.76* 07/31/2015 1848   HGB 10.4* 07/31/2015 1848   HCT 34.0* 07/31/2015 1848   PLT 90* 07/31/2015 1848   MCV 90.4 07/31/2015 1848   MCH 27.7 07/31/2015 1848   MCHC 30.6 07/31/2015 1848   RDW 16.1* 07/31/2015 1848   LYMPHSABS 2.0 07/31/2015 0511   MONOABS 1.3* 07/31/2015 0511   EOSABS 0.3 07/31/2015 0511   BASOSABS 0.0 07/31/2015 0511     Assessment: 1. ESRD  MWF [place] 2. HTN 3. 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 HD today.  Will not use heparin   Gail Chapman T

## 2015-08-02 NOTE — Progress Notes (Signed)
Physical Therapy Session Note  Patient Details  Name: RECHELLE SCHULER MRN: [id number] Date of Birth: 1955-12-20  Today's Date: 08/02/2015 PT Individual Time: 9509-3267 PT Individual Time Calculation (min): 38 min   Short Term Goals: Week 1:  PT Short Term Goal 1 (Week 1): Pt will sustain attention to functional task x 2 minutes with max multimodal cues.  PT Short Term Goal 2 (Week 1): Patient will transfer bed <> wheelchair with assist of one person.  PT Short Term Goal 3 (Week 1): Patient will initiate gait training.  PT Short Term Goal 4 (Week 1): Patient will propel wheelchair x 50 ft with supervision and max multimodal cues.   Skilled Therapeutic Interventions/Progress Updates:    Patient seated in w/c.  Assisted to bathroom per pt request.  Stand step w/c to toilet max assist.  Attempted to have pt assist with hygiene and pt able to obtain paper with mod cues, then after demo unable to complete hygiene.  So sit to stand from toilet max assist and total assist with hygiene.  Patient stand pivot to wheelchair +2 assist for safety.  Assisted to gym total assist in w/c.  Sit to stand in parallel bars mod assist, increased time, ambulated x 8' mod assist in bars max cues and facilitation for weight shift at pelvis.  Patient drags left LE and pelvis behind.  Demonstrates unsafe stand to sit due to sitting prior to turning all the way around.  Patient propelled w/c in gym x 15' max cues and min/mod assist for turns pt needing consistent cues for attention and technique.  Spouse in room upon arrival, and report concern with Depakote making patient too lethargic to do anything.  Patient assisted back to bed at RN request due to dialysis with max assist squat pivot and for sit to supine.  Spouse assist with scooting pt up in bed.   Therapy Documentation Precautions:  Precautions Precautions: Fall Precaution Comments: hx of MS with baseline visual changes Restrictions Weight Bearing Restrictions:  No  Pain: Pain Assessment Pain Assessment: No/denies pain   See Function Navigator for Current Functional Status.   Therapy/Group: Individual Therapy  Rudi Coco [place], [place] [phone] 08/02/2015  08/02/2015, 5:03 PM

## 2015-08-02 NOTE — Progress Notes (Signed)
Patient and husband refusing to go to HD today. Educated and encouraged pt to go with pt continuing to refuse HD. HD notified.

## 2015-08-03 ENCOUNTER — Inpatient Hospital Stay (HOSPITAL_COMMUNITY): Payer: Medicare Other | Admitting: *Deleted

## 2015-08-03 ENCOUNTER — Inpatient Hospital Stay (HOSPITAL_COMMUNITY): Payer: Medicare Other | Admitting: Physical Therapy

## 2015-08-03 ENCOUNTER — Inpatient Hospital Stay (HOSPITAL_COMMUNITY): Payer: Medicare Other | Admitting: Speech Pathology

## 2015-08-03 ENCOUNTER — Other Ambulatory Visit (HOSPITAL_COMMUNITY): Payer: Self-pay | Admitting: *Deleted

## 2015-08-03 ENCOUNTER — Inpatient Hospital Stay (HOSPITAL_COMMUNITY): Payer: BLUE CROSS/BLUE SHIELD | Admitting: Speech Pathology

## 2015-08-03 LAB — RENAL FUNCTION PANEL
ANION GAP: 16 — AB (ref 5–15)
Albumin: 2.5 g/dL — ABNORMAL LOW (ref 3.5–5.0)
BUN: 74 mg/dL — ABNORMAL HIGH (ref 6–20)
CALCIUM: 8.6 mg/dL — AB (ref 8.9–10.3)
CHLORIDE: 97 mmol/L — AB (ref 101–111)
CO2: 23 mmol/L (ref 22–32)
Creatinine, Ser: 6.93 mg/dL — ABNORMAL HIGH (ref 0.44–1.00)
GFR calc non Af Amer: 6 mL/min — ABNORMAL LOW (ref >60)
GFR, EST AFRICAN AMERICAN: 7 mL/min — AB (ref >60)
GLUCOSE: 114 mg/dL — AB (ref 65–99)
Phosphorus: 9.3 mg/dL — ABNORMAL HIGH (ref 2.5–4.6)
Potassium: 4.7 mmol/L (ref 3.5–5.1)
SODIUM: 136 mmol/L (ref 135–145)

## 2015-08-03 LAB — GLUCOSE, CAPILLARY
GLUCOSE-CAPILLARY: 85 mg/dL (ref 65–99)
Glucose-Capillary: 73 mg/dL (ref 65–99)
Glucose-Capillary: 80 mg/dL (ref 65–99)

## 2015-08-03 LAB — CBC
HCT: 32.4 % — ABNORMAL LOW (ref 36.0–46.0)
HEMOGLOBIN: 10.3 g/dL — AB (ref 12.0–15.0)
MCH: 28.5 pg (ref 26.0–34.0)
MCHC: 31.8 g/dL (ref 30.0–36.0)
MCV: 89.8 fL (ref 78.0–100.0)
Platelets: 110 10*3/uL — ABNORMAL LOW (ref 150–400)
RBC: 3.61 MIL/uL — AB (ref 3.87–5.11)
RDW: 15.9 % — ABNORMAL HIGH (ref 11.5–15.5)
WBC: 18 10*3/uL — ABNORMAL HIGH (ref 4.0–10.5)

## 2015-08-03 MED ORDER — CALCITRIOL 0.25 MCG PO CAPS
ORAL_CAPSULE | ORAL | Status: AC
  Administered 2015-08-03: 0.25 ug via ORAL
  Filled 2015-08-03: qty 1

## 2015-08-03 MED ORDER — NEPRO/CARBSTEADY PO LIQD
237.0000 mL | Freq: Two times a day (BID) | ORAL | Status: DC
  Administered 2015-08-04: 237 mL via ORAL
  Filled 2015-08-03: qty 237

## 2015-08-03 NOTE — Progress Notes (Signed)
Placed patient on CPAP for the night. Patient tolerating well.  

## 2015-08-03 NOTE — Progress Notes (Signed)
Occupational Therapy Weekly Progress Note  Patient Details  Name: Gail Chapman MRN: [id number] Date of Birth: [birth year]  Beginning of progress report period: July 27, 2015 End of progress report period: August 03, 2015  Patient has met 3 of 5 short term goals.  Pt has made minimal progress with BADLs since admission.  Pt continues to require max verbal cues to initiate all bathing and dressing tasks and hand over hand assistance to complete tasks.  LB bathing and dressing tasks are currently completed at bed level or with +2 physical assistance to stand to complete tasks.  Pt requires more than a reasonable amount of time to initiate all tasks.  Pt has been scheduled for night baths secondary to increased time required to therapeutically complete BADLs.  Pt has been refusing night baths, however.   Patient continues to demonstrate the following deficits: limited initiation, attention, poor activity tolerance, poor trunk control, limited use of LUE, visual impairments and therefore will continue to benefit from skilled OT intervention to enhance overall performance with Reduce care partner burden.  Patient not progressing toward long term goals.  LTGs modified to max A with one helper as pt is currently max -total A with 2 helpers.  Continue plan of care.  OT Short Term Goals Week 1:  OT Short Term Goal 1 (Week 1): Pt will wash UB with min A. OT Short Term Goal 1 - Progress (Week 1): Progressing toward goal OT Short Term Goal 2 (Week 1): Pt will don shirt with max A.  OT Short Term Goal 2 - Progress (Week 1): Met OT Short Term Goal 3 (Week 1): Pt will transfer to toilet with squat pivot of max A x1.  OT Short Term Goal 3 - Progress (Week 1): Progressing toward goal OT Short Term Goal 4 (Week 1): Pt will actively use left arm to wash R arm with min A. OT Short Term Goal 4 - Progress (Week 1): Met OT Short Term Goal 5 (Week 1): Pt will find items on her left side with mod cues. OT Short  Term Goal 5 - Progress (Week 1): Met Week 2:  OT Short Term Goal 1 (Week 2): Pt will bathe UB with min A OT Short Term Goal 2 (Week 2): Pt will transfer to toilet with squat pivot of max A + 1 OT Short Term Goal 3 (Week 2): Pt will don shirt with mod A OT Short Term Goal 4 (Week 2): Pt will initiate and brush teeth with supervision and min verbal cues      Therapy Documentation Precautions:  Precautions Precautions: Fall Precaution Comments: hx of MS with baseline visual changes Restrictions Weight Bearing Restrictions: No  See Function Navigator for Current Functional Status.     Leotis Shames [facility] 08/03/2015, 11:49 AM

## 2015-08-03 NOTE — Progress Notes (Signed)
Speech Language Pathology Weekly Progress and Session Note  Patient Details  Name: AJIAH MCGLINN MRN: [id number] Date of Birth: 10/10/1955  Beginning of progress report period: July 26, 2015 End of progress report period: August 03, 2015  Today's Date: 08/03/2015 SLP Individual Time: 1430-1500 SLP Individual Time Calculation (min): 30 min  Short Term Goals: Week 1: SLP Short Term Goal 1 (Week 1): Pt will sustain attention to functional task for 2 minutes with Max cues SLP Short Term Goal 1 - Progress (Week 1): Not met SLP Short Term Goal 2 (Week 1): Pt will utilize external memory aids to facilitate recall of new and daily information with Max cues SLP Short Term Goal 2 - Progress (Week 1): Not met SLP Short Term Goal 3 (Week 1): Pt will verbalize at least 1 physical and 2 cognitive deficits with Max cues SLP Short Term Goal 3 - Progress (Week 1): Not met SLP Short Term Goal 4 (Week 1): Pt will complete basic problem solving tasks with 80% accuracy with Max cues SLP Short Term Goal 4 - Progress (Week 1): Not met SLP Short Term Goal 5 (Week 1): Pt will initiate functional tasks with no more than 2 verbal cues in 50% of opportunities SLP Short Term Goal 5 - Progress (Week 1): Not met SLP Short Term Goal 6 (Week 1): Pt will consume her currenty prescribed diet with min verbal cues for use of swallowing precautions and minimal overt s/s of aspiration.  SLP Short Term Goal 6 - Progress (Week 1): Not met    New Short Term Goals: Week 2: SLP Short Term Goal 1 (Week 2): Pt will consume her currenty prescribed diet with min verbal cues for use of swallowing precautions and minimal overt s/s of aspiration.  SLP Short Term Goal 2 (Week 2): Pt will initiate functional tasks with no more than 2 verbal cues in 50% of opportunities SLP Short Term Goal 3 (Week 2): Pt will complete basic problem solving tasks with 80% accuracy with Max cues SLP Short Term Goal 4 (Week 2): Pt will sustain attention  to functional task for 2 minutes with Max cues SLP Short Term Goal 5 (Week 2): Pt will utilize external memory aids to facilitate recall of new and daily information with Max cues SLP Short Term Goal 6 (Week 2): Pt will verbalize at least 1 physical and 2 cognitive deficits with Max cues  Weekly Progress Updates: Patient has made minimal gains and has not met any STG's this reporting period. Currently, patient is consuming Dys. 2 textures with thin liquids with intermittent overt s/s of aspiration and minimal PO intake and requires Max A multimodal cues for initiation of self-feeding and Mod A multimodal cues for awareness of oral holding.  Patient also requires Max-Total A for initiation of functional tasks, sustained attention, intellectual awareness of deficits, problem solving and recall of new information. Patient and family education is ongoing and patient would benefit from continued skilled SLP intervention to maximize dysphagia and cognitive function in order to maximize her functional independence.    Intensity: Minumum of 1-2 x/day, 30 to 90 minutes Frequency: 3 to 5 out of 7 days Duration/Length of Stay: 08/10/15 Treatment/Interventions: Cognitive remediation/compensation;Cueing hierarchy;Environmental controls;Functional tasks;Internal/external aids;Patient/family education;Speech/Language facilitation;Therapeutic Activities   Daily Session  Skilled Therapeutic Interventions: Skilled treatment session focused on dysphagia and cognitive goals. Upon arrival, patient was sitting upright in wheelchair while consuming her lunch meal with family present. Patient required Max-Total A multimodal cues for initiation of self-feeding of  Dys. 1 textures and thin liquids and Mod A verbal cues to self-monitor and correct oral holding of liquids as well as sustained attention to task for ~60 seconds. Patient consumed limited PO trials without overt s/s of aspiration. Patient requested to use the  bathroom with Mod A question cues and was transferred to the toilet with +3 assist via the [facility] and +4 assist for peri care. Patient transferred back to bed at end of session due to fatigue. Patient left supine in bed with alarm on and family present. Continue with current plan of care.      Function:   Eating Eating   Modified Consistency Diet: Yes Eating Assist Level: Set up assist for;Supervision or verbal cues;Helper checks for pocketed food;Helper scoops food on utensil;Helper brings food to mouth   Eating Set Up Assist For: Opening containers;Cutting food;Applying device (includes dentures) Helper Scoops Food on Utensil: Occasionally Helper Brings Food to Mouth: Occasionally   Cognition Comprehension Comprehension assist level: Understands basic 25 - 49% of the time/ requires cueing 50 - 75% of the time  Expression   Expression assist level: Expresses basic 50 - 74% of the time/requires cueing 25 - 49% of the time. Needs to repeat parts of sentences.  Social Interaction Social Interaction assist level: Interacts appropriately 25 - 49% of time - Needs frequent redirection.  Problem Solving Problem solving assist level: Solves basic less than 25% of the time - needs direction nearly all the time or does not effectively solve problems and may need a restraint for safety  Memory Memory assist level: Recognizes or recalls 25 - 49% of the time/requires cueing 50 - 75% of the time   Pain Pain Assessment Pain Assessment: No/denies pain Pain Score: 9  Pain Type: Chronic pain Pain Location: Back Pain Descriptors / Indicators: Aching  Therapy/Group: Individual Therapy  Ayari Liwanag 08/03/2015, 5:12 PM

## 2015-08-03 NOTE — Progress Notes (Signed)
Initial Nutrition Assessment  DOCUMENTATION CODES:   Not applicable  INTERVENTION:  Provide Nepro Shake po BID, each supplement provides 425 kcal and 19 grams protein.  Encourage adequate PO intake.   NUTRITION DIAGNOSIS:   Increased nutrient needs related to chronic illness as evidenced by estimated needs.  GOAL:   Patient will meet greater than or equal to 90% of their needs  MONITOR:   PO intake, Supplement acceptance, Weight trends, Labs, Diet advancement, I & O's  REASON FOR ASSESSMENT:   Malnutrition Screening Tool    ASSESSMENT:   59 y.o. 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 Pt was being dialyzed. Meal completion has been varied from 15-100%. Family reports pt usually eats well, however her says her seizure medication has been causing her a lack of appetite. Per Epic weight records, pt with a 11% weight loss in 3 months. RD to monitor closely. RD to order Nepro Shake to aid in caloric and protein needs.   Unable to complete Nutrition-Focused physical exam at this time.   Labs and medications reviewed.   Diet Order:  DIET DYS 2 Room service appropriate?: Yes; Fluid consistency:: Thin; Fluid restriction:: 1200 mL Fluid  Skin:  Reviewed, no issues  Last BM:  10/24  Height:   Ht Readings from Last 1 Encounters:  07/26/15 5' 1.5" (1.562 m)    Weight:   Wt Readings from Last 1 Encounters:  08/03/15 140 lb 14 oz (63.9 kg)    Ideal Body Weight:  48.8 kg  BMI:  Body mass index is 26.19 kg/(m^2).  Estimated Nutritional Needs:   Kcal:  1800-2000  Protein:  80-95 grams  Fluid:  Per MD  EDUCATION NEEDS:   No education needs identified at this time  Roslyn Smiling, MS,  RD, LDN Pager # [phone] After hours/ weekend pager # [phone]

## 2015-08-03 NOTE — Progress Notes (Signed)
RN Discussed medication regimen with patients husband and patient. Husband upset with RN and MD stating "all these medications are causing her to have strokes and seizures." Husband encouraging patient to refuse all medications with patient refusing. RN educated and encouraged pt to take medications with husband rudely, interrupting [employer] fundraiser.  Depakote 750mg  dose given as ordered, all other medications refused by patient and husband.  2140 pt complaining of aching to chest. VS 166/51 HR 74 with Marissa Nestle, PA notified. New order for EKG. Notified Marissa Nestle, PA with results with pt denying any chest discomfort 2205. No further complaints of chest discomfort. RN encouraged pt to receive night bath with pt refusing. Pt refused to change into night gown. RN discussed with husband need for night bath with husband also refusing. Will continue to monitor pt.

## 2015-08-03 NOTE — Progress Notes (Signed)
Speech Language Pathology Daily Session Note  Patient Details  Name: Gail Chapman MRN: [id number] Date of Birth: 11/18/1955  Today's Date: 08/03/2015 SLP Individual Time: 1030-1100 SLP Individual Time Calculation (min): 30 min  Short Term Goals: Week 1: SLP Short Term Goal 1 (Week 1): Pt will sustain attention to functional task for 2 minutes with Max cues SLP Short Term Goal 2 (Week 1): Pt will utilize external memory aids to facilitate recall of new and daily information with Max cues SLP Short Term Goal 3 (Week 1): Pt will verbalize at least 1 physical and 2 cognitive deficits with Max cues SLP Short Term Goal 4 (Week 1): Pt will complete basic problem solving tasks with 80% accuracy with Max cues SLP Short Term Goal 5 (Week 1): Pt will initiate functional tasks with no more than 2 verbal cues in 50% of opportunities SLP Short Term Goal 6 (Week 1): Pt will consume her currenty prescribed diet with min verbal cues for use of swallowing precautions and minimal overt s/s of aspiration.   Skilled Therapeutic Interventions: Skilled treatment session focused on cognitive goals. Upon arrival, patient was awake while sitting upright in the wheelchair and was agreeable to participate in the treatment session. SLP facilitated session by providing Max-Total A multimodal cues for initiation with a basic and familiar card task. Patient also sustained attention to task for ~60 seconds with Max A multimodal cues and required extra time for verbal responses during a basic conversation. Patient left upright in wheelchair with quick release belt in place and all needs within reach. Continue with current plan of care.    Function:  Cognition Comprehension Comprehension assist level: Understands basic 25 - 49% of the time/ requires cueing 50 - 75% of the time  Expression   Expression assist level: Expresses basic 50 - 74% of the time/requires cueing 25 - 49% of the time. Needs to repeat parts of sentences.   Social Interaction Social Interaction assist level: Interacts appropriately 25 - 49% of time - Needs frequent redirection.  Problem Solving Problem solving assist level: Solves basic less than 25% of the time - needs direction nearly all the time or does not effectively solve problems and may need a restraint for safety  Memory Memory assist level: Recognizes or recalls 25 - 49% of the time/requires cueing 50 - 75% of the time    Pain Pain Assessment Pain Assessment: No/denies pain   Therapy/Group: Individual Therapy  Jennilee Demarco 08/03/2015, 4:58 PM

## 2015-08-03 NOTE — Progress Notes (Signed)
Physical Therapy Weekly Progress Note  Patient Details  Name: Gail Chapman MRN: [id number] Date of Birth: April 09, 1956  Beginning of progress report period: July 27, 2015 End of progress report period: August 03, 2015  Today's Date: 08/03/2015 PT Individual Time: 1300-1333 PT Individual Time Calculation (min): 33 min   Patient has met 1 of 4 short term goals.  Patient has made little functional progress this reporting period. Patient requires max multimodal cues and hand over hand assist to initiate all functional mobility and more than reasonable amount of time. Patient requires max to total A x 2 for mobility and max-total A for initiation of functional tasks, sustained attention, intellectual awareness of deficits, problem solving and recall of new information. Patient's performance is also greatly impacted by fatigue throughout day.   Patient continues to demonstrate the following deficits: muscle weakness, muscle joint tightness, decreased endurance, impaired timing and sequencing, abnormal tone, unbalanced muscle activation, motor apraxia, decreased coordination, decreased motor planning, decreased visual acuity and decreased visual perceptual skills, decreased midline orientation, decreased attention to left, decreased initiation, decreased sustained attention, decreased awareness, decreased problem solving, decreased safety awareness, decreased memory, delayed processing, decreased sitting balance, decreased standing balance, decreased postural control and decreased balance strategies and therefore will continue to benefit from skilled PT intervention to enhance overall performance with activity tolerance, balance, postural control, ability to compensate for deficits, functional use of  right upper extremity, right lower extremity, left upper extremity and left lower extremity, attention, awareness and coordination.  Patient not progressing toward long term goals at this time. Plan to  downgrade goals at next progress report period.  PT Short Term Goals Week 1:  PT Short Term Goal 1 (Week 1): Pt will sustain attention to functional task x 2 minutes with max multimodal cues.  PT Short Term Goal 1 - Progress (Week 1): Not met PT Short Term Goal 2 (Week 1): Patient will transfer bed <> wheelchair with assist of one person.  PT Short Term Goal 2 - Progress (Week 1): Not met PT Short Term Goal 3 (Week 1): Patient will initiate gait training.  PT Short Term Goal 3 - Progress (Week 1): Met PT Short Term Goal 4 (Week 1): Patient will propel wheelchair x 50 ft with supervision and max multimodal cues.  PT Short Term Goal 4 - Progress (Week 1): Not met Week 2:  PT Short Term Goal 1 (Week 2): Pt will sustain attention to functional task x 2 minutes with max multimodal cues.  PT Short Term Goal 2 (Week 2): Patient will transfer bed <> wheelchair with consistent max A x 1.  PT Short Term Goal 3 (Week 2): Patient will propel wheelchair x 50 ft with supervision and max multimodal cues.  PT Short Term Goal 4 (Week 2): Patient will transfer sit <> stand with max A x 1.  PT Short Term Goal 5 (Week 2): Patient will perform bed mobility with consistent max A x 1.   Skilled Therapeutic Interventions/Progress Updates:   Patient asleep in wheelchair upon arrival, RN reporting patient was too fatigued to eat lunch. Patient reported she was not hungry at this time. Patient with heavy lateral lean to L and unable to correct to midline despite max A and max multimodal cues. Instructed in wheelchair propulsion with hand over hand assist for initiation but patient unable to continue task without physical assistance. In parallel bars, attempted sit <> stand with max A x 2 and hand over hand assist to initiate forward  lean, reaching for bars, pushing up, etc. Patient was unable to come to a complete stand with progressive worsening of forward trunk lean/forward flexed head and RLE sliding out from underneath  her despite +2A. Eventually patient was unable to hold head up in seated position with lateral lean to L despite max multimodal cues and continued to fall asleep unless therapist was speaking to her. Patient requested to return to bed, +2A for squat pivot transfer, sit > supine, and repositioning in bed with no initiation for tasks from patient despite increased time and cues. Patient left semi reclined in bed with pillows positioned to promote midline neck position due to neck laterally flexed and rotated L and bed alarm on.   Therapy Documentation Precautions:  Precautions Precautions: Fall Precaution Comments: hx of MS with baseline visual changes Restrictions Weight Bearing Restrictions: No General: PT Amount of Missed Time (min): 42 Minutes PT Missed Treatment Reason: Patient fatigue Pain: Left upper back and left neck when therapist assisting patient to midline positioning, resistant to movement and grimacing  See Function Navigator for Current Functional Status.  Therapy/Group: Individual Therapy  Laretta Alstrom 08/03/2015, 1:41 PM

## 2015-08-03 NOTE — Progress Notes (Addendum)
[facility] Progress Note  Subjective/Complaints: Pt seen and examined this AM eating breakfast.  Husband at bedside, with several questions about medications and events on acute floor.  He, in particular, would like her Depakote to be decreased.    ROS: limited due to cognition.  However appears to deny CP, SOB, n/v/d.   Objective: Vital Signs: Blood pressure 171/75, pulse 72, temperature 98.1 F (36.7 C), temperature source Oral, resp. rate 18, height 5' 1.5" (1.562 m), weight 66.3 kg (146 lb 2.6 oz), SpO2 100 %. No results found. Results for orders placed or performed during the hospital encounter of 07/26/15 (from the past 72 hour(s))  Heparin induced platelet Ab (HIT antibody)     Status: Abnormal   Collection Time: 07/31/15 10:46 AM  Result Value Ref Range   Heparin Induced Plt Ab 0.750 (H) 0.000 - 0.400 OD    Comment: (NOTE) Performed At: [facility] [address] [place], [place] [id number] Lindon Romp MD [id number]   Glucose, capillary     Status: None   Collection Time: 07/31/15 12:18 PM  Result Value Ref Range   Glucose-Capillary 92 65 - 99 mg/dL  Glucose, capillary     Status: Abnormal   Collection Time: 07/31/15  4:30 PM  Result Value Ref Range   Glucose-Capillary 110 (H) 65 - 99 mg/dL  Renal function panel     Status: Abnormal   Collection Time: 07/31/15  6:47 PM  Result Value Ref Range   Sodium 135 135 - 145 mmol/L   Potassium 4.4 3.5 - 5.1 mmol/L   Chloride 97 (L) 101 - 111 mmol/L   CO2 23 22 - 32 mmol/L   Glucose, Bld 141 (H) 65 - 99 mg/dL   BUN 49 (H) 6 - 20 mg/dL   Creatinine, Ser 6.21 (H) 0.44 - 1.00 mg/dL    Comment: DELTA CHECK NOTED   Calcium 8.8 (L) 8.9 - 10.3 mg/dL   Phosphorus 8.4 (H) 2.5 - 4.6 mg/dL   Albumin 2.5 (L) 3.5 - 5.0 g/dL   GFR calc non Af Amer 7 (L) >60 mL/min   GFR calc Af Amer 8 (L) >60 mL/min    Comment: (NOTE) The eGFR has been calculated using the CKD EPI equation. This  calculation has not been validated in all clinical situations. eGFR's persistently <60 mL/min signify possible Chronic Kidney Disease.    Anion gap 15 5 - 15  CBC     Status: Abnormal   Collection Time: 07/31/15  6:48 PM  Result Value Ref Range   WBC 12.0 (H) 4.0 - 10.5 K/uL   RBC 3.76 (L) 3.87 - 5.11 MIL/uL   Hemoglobin 10.4 (L) 12.0 - 15.0 g/dL   HCT 34.0 (L) 36.0 - 46.0 %   MCV 90.4 78.0 - 100.0 fL   MCH 27.7 26.0 - 34.0 pg   MCHC 30.6 30.0 - 36.0 g/dL   RDW 16.1 (H) 11.5 - 15.5 %   Platelets 90 (L) 150 - 400 K/uL    Comment: CONSISTENT WITH PREVIOUS RESULT  Glucose, capillary     Status: Abnormal   Collection Time: 07/31/15 11:31 PM  Result Value Ref Range   Glucose-Capillary 165 (H) 65 - 99 mg/dL  Glucose, capillary     Status: None   Collection Time: 08/01/15  6:31 AM  Result Value Ref Range   Glucose-Capillary 82 65 - 99 mg/dL  Glucose, capillary     Status: Abnormal   Collection Time: 08/01/15 11:23 AM  Result Value Ref Range   Glucose-Capillary 108 (H) 65 - 99 mg/dL  Glucose, capillary     Status: None   Collection Time: 08/01/15  4:37 PM  Result Value Ref Range   Glucose-Capillary 89 65 - 99 mg/dL  Glucose, capillary     Status: Abnormal   Collection Time: 08/01/15  9:45 PM  Result Value Ref Range   Glucose-Capillary 118 (H) 65 - 99 mg/dL  Glucose, capillary     Status: None   Collection Time: 08/02/15  6:59 AM  Result Value Ref Range   Glucose-Capillary 91 65 - 99 mg/dL  Glucose, capillary     Status: None   Collection Time: 08/02/15 11:38 AM  Result Value Ref Range   Glucose-Capillary 89 65 - 99 mg/dL  Glucose, capillary     Status: None   Collection Time: 08/02/15  4:48 PM  Result Value Ref Range   Glucose-Capillary 80 65 - 99 mg/dL  Urine culture     Status: None (Preliminary result)   Collection Time: 08/02/15  8:54 PM  Result Value Ref Range   Specimen Description URINE, CLEAN CATCH    Special Requests NONE    Culture NO GROWTH < 12 HOURS     Report Status PENDING   Glucose, capillary     Status: None   Collection Time: 08/02/15  9:06 PM  Result Value Ref Range   Glucose-Capillary 94 65 - 99 mg/dL   Comment 1 Notify RN   Glucose, capillary     Status: None   Collection Time: 08/03/15  6:52 AM  Result Value Ref Range   Glucose-Capillary 85 65 - 99 mg/dL   Comment 1 Notify RN      Gen: NAD.  VS reviewed.  HEENT: Normocephalic, atraumatic Eyes: Conj WNL, EOMI Cardio: RRR and murmur Resp: CTA B/L and unlabored GI: BS positive and NT, ND Skin:   Intact. Warm and Dry. MSK:   4/5 grossly throughout. Neurological: She is alert.  Patient disoriented. Lacks insight and awareness of deficits. Able to follow simple motor commands without difficulty.  Skin: Skin is warm and dry. No rash noted. She is not diaphoretic. No erythema.  Psychiatric: Her affect is blunt. Her speech is delayed. She is slowed. Cognition and memory are impaired.    Assessment/Plan: 1. 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 H/o MS with chronic paraplegia.    which require 3+ hours per day of interdisciplinary therapy in a comprehensive inpatient rehab setting. Physiatrist is providing close team supervision and 24 hour management of active medical problems listed below. Physiatrist and rehab team continue to assess barriers to discharge/monitor patient progress toward functional and medical goals. FIM: Function - Bathing Bathing activity did not occur: N/A (changed to evening bath) Position: Wheelchair/chair at sink Body parts bathed by patient: Chest, Abdomen Body parts bathed by helper: Right arm, Left arm, Front perineal area, Buttocks, Right upper leg, Left upper leg, Right lower leg, Left lower leg, Back Assist Level: 2 helpers  Function- Upper Body Dressing/Undressing What is the patient wearing?: Pull over shirt/dress Pull over shirt/dress - Perfomed by patient: Thread/unthread right sleeve,  Thread/unthread left sleeve, Pull shirt over trunk Pull over shirt/dress - Perfomed by helper: Thread/unthread right sleeve, Thread/unthread left sleeve, Put head through opening, Pull shirt over trunk Assist Level: Touching or steadying assistance(Pt > 75%) Function - Lower Body Dressing/Undressing What is the patient wearing?: Pants, Underwear, Non-skid slipper socks Position: Bed Underwear - Performed by  helper: Thread/unthread right underwear leg, Thread/unthread left underwear leg, Pull underwear up/down Pants- Performed by helper: Thread/unthread right pants leg, Thread/unthread left pants leg, Pull pants up/down, Fasten/unfasten pants Non-skid slipper socks- Performed by helper: Don/doff right sock, Don/doff left sock Assist for footwear: Dependant Assist for lower body dressing: 2 Helpers  Function - Toileting Toileting activity did not occur: N/A Toileting steps completed by helper: Performs perineal hygiene, Adjust clothing after toileting, Adjust clothing prior to toileting Toileting Assistive Devices: Grab bar or rail Assist level: Two helpers  Function - Archivist transfer activity did not occur: N/A Toilet transfer assistive device: Elevated toilet seat/BSC over toilet, Grab bar Mechanical lift: Stedy Assist level to toilet: Moderate assist (Pt 50 - 74%/lift or lower) Assist level from toilet: Maximal assist (Pt 25 - 49%/lift and lower)  Function - Chair/bed transfer Chair/bed transfer method: Squat pivot Chair/bed transfer assist level: Maximal assist (Pt 25 - 49%/lift and lower) Chair/bed transfer assistive device: Armrests Chair/bed transfer details: Verbal cues for technique, Verbal cues for precautions/safety, Verbal cues for safe use of DME/AE, Manual facilitation for placement  Function - Locomotion: Wheelchair Will patient use wheelchair at discharge?: Yes Type: Manual Max wheelchair distance: 15 Assist Level: Touching or steadying assistance (Pt  > 75%) Wheel 50 feet with 2 turns activity did not occur: Safety/medical concerns Wheel 150 feet activity did not occur: Safety/medical concerns Turns around,maneuvers to table,bed, and toilet,negotiates 3% grade,maneuvers on rugs and over doorsills: No Function - Locomotion: Ambulation Ambulation activity did not occur: Safety/medical concerns (lethargic, unable to maintain static standing using RW > 5 sec with max A x 2 before sitting) Assistive device: Parallel bars Max distance: 8 Assist level: Moderate assist (Pt 50 - 74%) Walk 10 feet activity did not occur: Safety/medical concerns Walk 50 feet with 2 turns activity did not occur: Safety/medical concerns Walk 150 feet activity did not occur: Safety/medical concerns Walk 10 feet on uneven surfaces activity did not occur: Safety/medical concerns  Function - Comprehension Comprehension: Auditory Comprehension assist level: Understands basic 25 - 49% of the time/ requires cueing 50 - 75% of the time  Function - Expression Expression: Verbal Expression assist level: Expresses basic 25 - 49% of the time/requires cueing 50 - 75% of the time. Uses single words/gestures.  Function - Social Interaction Social Interaction assist level: Interacts appropriately 25 - 49% of time - Needs frequent redirection.  Function - Problem Solving Problem solving assist level: Solves basic less than 25% of the time - needs direction nearly all the time or does not effectively solve problems and may need a restraint for safety  Function - Memory Memory assist level: Recognizes or recalls less than 25% of the time/requires cueing greater than 75% of the time Patient normally able to recall (first 3 days only): That he or she is in a hospital, Current season  Medical Problem List and Plan: 1. 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 H/o MS.    2.  DVT Prophylaxis/Anticoagulation: SCDs ordered 10/21.  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  -Pt's husband refusing medication, will cont to educate  -SRA pending 3. Pain Management: Tylenol prn   4. Mood: LCSW to follow for evaluation and support.   5. Neuropsych: This patient is not capable of making decisions on her own behalf. 6. Skin/Wound Care: Routine pressure relief measures 7. Fluids/Electrolytes/Nutrition: Monitor I/O. 1200 cc FR.  Labs with HD.  8. 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    9. HTN: Need to prevent hypotension to prevent recurrent watershed infarcts.  Labile pressures, due to husband holding medications.  Will cont to monitor and educate pt's husband regarding holding medications.   10. ESRD: Has history of orhtostatic changes.   11. OSA: Continue CPAP at bedtime/sleeping.   12. E coli Sepsis: continue Rocephin.   13. Arrthymias:  Husband refusing Coreg--he holds her BP meds at home if SBP < 180. He has refused multiple doses of coreg. Relayed reported episodes of  asymptomatic VT and need to continue coreg. Will set hold parameter on Cardura.   14. DM type 2: Husband does not want BS covered if under 120.  Will monitor BS ac/hs. BS well controlled off lantus. Will continue SSI for now.  Fasting FSBG 85 this AM. 15. COPD: Uses oxygen prn with activity.  16. Anemia: Hb 10.4 on 10/22. Stable 17. Leukocytosis: Trending drown.  Will cont to monitor. 18. Thrombocytopenia: Trending down. Plts 90 on 10/22.  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  19. Seizures: Will maintain current dose of Depakote as it was decreased to 750 from 1000 and Keppra was d/ced.  Educated on reason and importance of medication.    Total visit time: 45 minutes. Counseling time: 35 minutes.    LOS (Days) 8 A FACE TO FACE EVALUATION WAS PERFORMED  Ankit Lorie Phenix 08/03/2015, 8:49 AM

## 2015-08-03 NOTE — Progress Notes (Signed)
S: Refused to go to HD yest as she said she went Sat? O:BP 171/75 mmHg  Pulse 72  Temp(Src) 98.1 F (36.7 C) (Oral)  Resp 18  Ht 5' 1.5" (1.562 m)  Wt 66.3 kg (146 lb 2.6 oz)  BMI 27.17 kg/m2  SpO2 100%  Intake/Output Summary (Last 24 hours) at 08/03/15 1321 Last data filed at 08/03/15 1155  Gross per 24 hour  Intake    360 ml  Output      0 ml  Net    360 ml   Weight change:  LYY:TKPTW and alert CVS:RRR Resp:Clear Abd:+ BS NTND Ext:no edema NEURO: Ox3.  Ambulates very little which is not new Lt IJ PC   . antiseptic oral rinse  7 mL Mouth Rinse BID  . aspirin EC  325 mg Oral Daily  . calcitRIOL  0.25 mcg Oral Q M,W,F-HD  . carvedilol  12.5 mg Oral BID WC  . cefTRIAXone (ROCEPHIN)  IV  1 g Intravenous Q24H  . [START ON 08/04/2015] darbepoetin (ARANESP) injection - DIALYSIS  60 mcg Intravenous Q Wed-HD  . divalproex  750 mg Oral Q12H  . doxazosin  2 mg Oral QHS  . insulin aspart  0-9 Units Subcutaneous TID WC  . multivitamin  1 tablet Oral QHS  . pantoprazole  40 mg Oral QHS  . pravastatin  40 mg Oral q1800  . sevelamer carbonate  800 mg Oral TID WC   No results found. BMET    Component Value Date/Time   NA 135 07/31/2015 1847   K 4.4 07/31/2015 1847   CL 97* 07/31/2015 1847   CO2 23 07/31/2015 1847   GLUCOSE 141* 07/31/2015 1847   BUN 49* 07/31/2015 1847   CREATININE 6.21* 07/31/2015 1847   CALCIUM 8.8* 07/31/2015 1847   GFRNONAA 7* 07/31/2015 1847   GFRAA 8* 07/31/2015 1847   CBC    Component Value Date/Time   WBC 12.0* 07/31/2015 1848   RBC 3.76* 07/31/2015 1848   HGB 10.4* 07/31/2015 1848   HCT 34.0* 07/31/2015 1848   PLT 90* 07/31/2015 1848   MCV 90.4 07/31/2015 1848   MCH 27.7 07/31/2015 1848   MCHC 30.6 07/31/2015 1848   RDW 16.1* 07/31/2015 1848   LYMPHSABS 2.0 07/31/2015 0511   MONOABS 1.3* 07/31/2015 0511   EOSABS 0.3 07/31/2015 0511   BASOSABS 0.0 07/31/2015 0511     Assessment: 1. ESRD  MWF [place] 2. HTN 3. Seizure sec  Stroke 4. Thrombocytopenia HIT +  Plan: 1. Explained to pt that she went Sat as there were emergencies on fri and that yest was to resume her usual MWF schedule.  She says she will go today.  If she cont to refuse things she may as well be DC'd.   Raywood Wailes T

## 2015-08-03 NOTE — Progress Notes (Signed)
Occupational Therapy Session Note  Patient Details  Name: Gail Chapman MRN: [id number] Date of Birth: 1956-07-04  Today's Date: 08/03/2015 OT Individual Time: 0800-0900 OT Individual Time Calculation (min): 60 min    Short Term Goals: Week 1:  OT Short Term Goal 1 (Week 1): Pt will wash UB with min A. OT Short Term Goal 2 (Week 1): Pt will don shirt with max A.  OT Short Term Goal 3 (Week 1): Pt will transfer to toilet with squat pivot of max A x1.  OT Short Term Goal 4 (Week 1): Pt will actively use left arm to wash R arm with min A. OT Short Term Goal 5 (Week 1): Pt will find items on her left side with mod cues.  Skilled Therapeutic Interventions/Progress Updates:    Pt sitting in bed with HOB elevated and eating breakfast with NT present.  Pt engaged in BADL retraining including LB bathing and dressing at bed level and UB bathing and dressing seated in w/c at sink. Pt required max verbal cues and max A for bed mobility to roll side to side and sit EOB in preparation for squat pivot transfer to w/c. Pt continues to exhibited delayed processing and requires extra time to physically respond to requests. Pt verbally states she understands request but does not initiate unless hand over hand assistance provided.  Pt required 5 min and hand over hand assistance to initiate brushing teeth after the tooth brush was placed in her hand.  Pt does not initiate functional transfers. Focus on activity tolerance, bed mobility, task initiation, task completion, sequencing, sitting balance, functional transfers, and safety awareness.  Therapy Documentation Precautions:  Precautions Precautions: Fall Precaution Comments: hx of MS with baseline visual changes Restrictions Weight Bearing Restrictions: No  Pain:  Pt c/o pain (unrated) in middle back; repositioned  See Function Navigator for Current Functional Status.   Therapy/Group: Individual Therapy  Rich Brave 08/03/2015, 9:03  AM

## 2015-08-04 ENCOUNTER — Inpatient Hospital Stay (HOSPITAL_COMMUNITY): Payer: Medicare Other | Admitting: Physical Therapy

## 2015-08-04 ENCOUNTER — Inpatient Hospital Stay (HOSPITAL_COMMUNITY): Payer: Medicare Other | Admitting: Speech Pathology

## 2015-08-04 ENCOUNTER — Inpatient Hospital Stay (HOSPITAL_COMMUNITY): Payer: Medicare Other

## 2015-08-04 ENCOUNTER — Inpatient Hospital Stay (HOSPITAL_COMMUNITY): Payer: Medicare Other | Admitting: *Deleted

## 2015-08-04 LAB — GLUCOSE, CAPILLARY
GLUCOSE-CAPILLARY: 103 mg/dL — AB (ref 65–99)
GLUCOSE-CAPILLARY: 62 mg/dL — AB (ref 65–99)
Glucose-Capillary: 107 mg/dL — ABNORMAL HIGH (ref 65–99)
Glucose-Capillary: 87 mg/dL (ref 65–99)

## 2015-08-04 LAB — SEROTONIN RELEASE ASSAY (SRA)
SRA .2 IU/mL UFH Ser-aCnc: 4 % (ref 0–20)
SRA, HIGH DOSE HEPARIN: 1 % (ref 0–20)

## 2015-08-04 LAB — URINE CULTURE

## 2015-08-04 MED ORDER — DARBEPOETIN ALFA 60 MCG/0.3ML IJ SOSY
PREFILLED_SYRINGE | INTRAMUSCULAR | Status: AC
  Filled 2015-08-04: qty 0.3

## 2015-08-04 MED ORDER — CALCITRIOL 0.25 MCG PO CAPS
ORAL_CAPSULE | ORAL | Status: AC
  Filled 2015-08-04: qty 1

## 2015-08-04 NOTE — Plan of Care (Signed)
Problem: RH Balance Goal: LTG Patient will maintain dynamic standing balance (PT) LTG: Patient will maintain dynamic standing balance with assistance during mobility activities (PT)  Outcome: Not Applicable Date Met:  73/71/06 DC due to slow patient progress.   Problem: RH Furniture Transfers Goal: LTG Patient will perform furniture transfers w/assist (OT/PT LTG: Patient will perform furniture transfers with assistance (OT/PT).  Outcome: Not Applicable Date Met:  26/94/85 DC due to slow patient progress.  Problem: RH Ambulation Goal: LTG Patient will ambulate in controlled environment (PT) LTG: Patient will ambulate in a controlled environment, # of feet with assistance (PT).  Outcome: Not Applicable Date Met:  46/27/03 DC due to slow patient progress.  Problem: RH Stairs Goal: LTG Patient will ambulate up and down stairs w/assist (PT) LTG: Patient will ambulate up and down # of stairs with assistance (PT)  Outcome: Not Applicable Date Met:  50/09/38 DC due to slow patient progress.

## 2015-08-04 NOTE — Progress Notes (Signed)
S: Doesn't remember refusing HD on Mon.  Says she will go today O:BP 168/71 mmHg  Pulse 70  Temp(Src) 98.1 F (36.7 C) (Oral)  Resp 16  Ht 5' 1.5" (1.562 m)  Wt 63.9 kg (140 lb 14 oz)  BMI 26.19 kg/m2  SpO2 99%  Intake/Output Summary (Last 24 hours) at 08/04/15 1118 Last data filed at 08/04/15 0745  Gross per 24 hour  Intake    590 ml  Output   1300 ml  Net   -710 ml   Weight change:  MMN:OTRRN and alert CVS:RRR 2/6 systolic M Resp:Clear Abd:+ BS NTND Ext:no edema NEURO: No asterixis  Ambulates very little which is not new Lt IJ PC   . antiseptic oral rinse  7 mL Mouth Rinse BID  . aspirin EC  325 mg Oral Daily  . calcitRIOL  0.25 mcg Oral Q M,W,F-HD  . carvedilol  12.5 mg Oral BID WC  . cefTRIAXone (ROCEPHIN)  IV  1 g Intravenous Q24H  . darbepoetin (ARANESP) injection - DIALYSIS  60 mcg Intravenous Q Wed-HD  . divalproex  750 mg Oral Q12H  . doxazosin  2 mg Oral QHS  . feeding supplement (NEPRO CARB STEADY)  237 mL Oral BID BM  . insulin aspart  0-9 Units Subcutaneous TID WC  . multivitamin  1 tablet Oral QHS  . pantoprazole  40 mg Oral QHS  . pravastatin  40 mg Oral q1800  . sevelamer carbonate  800 mg Oral TID WC   No results found. BMET    Component Value Date/Time   NA 136 08/03/2015 1615   K 4.7 08/03/2015 1615   CL 97* 08/03/2015 1615   CO2 23 08/03/2015 1615   GLUCOSE 114* 08/03/2015 1615   BUN 74* 08/03/2015 1615   CREATININE 6.93* 08/03/2015 1615   CALCIUM 8.6* 08/03/2015 1615   GFRNONAA 6* 08/03/2015 1615   GFRAA 7* 08/03/2015 1615   CBC    Component Value Date/Time   WBC 18.0* 08/03/2015 1615   RBC 3.61* 08/03/2015 1615   HGB 10.3* 08/03/2015 1615   HCT 32.4* 08/03/2015 1615   PLT 110* 08/03/2015 1615   MCV 89.8 08/03/2015 1615   MCH 28.5 08/03/2015 1615   MCHC 31.8 08/03/2015 1615   RDW 15.9* 08/03/2015 1615   LYMPHSABS 2.0 07/31/2015 0511   MONOABS 1.3* 07/31/2015 0511   EOSABS 0.3 07/31/2015 0511   BASOSABS 0.0 07/31/2015 0511      Assessment: 1. ESRD  MWF [place] 2. HTN 3. Seizure sec Stroke 4. Thrombocytopenia HIT +  Plan: 1. Plan HD today to keep on MWF schedule.  Jahnessa Vanduyn T

## 2015-08-04 NOTE — Plan of Care (Signed)
Problem: RH Balance Goal: LTG Patient will maintain dynamic standing with ADLs (OT) LTG: Patient will maintain dynamic standing balance with assist during activities of daily living (OT)  LTG modified to max A of 1 due to limited progress.  Problem: RH Bathing Goal: LTG Patient will bathe with assist, cues/equipment (OT) LTG: Patient will bathe specified number of body parts with assist with/without cues using equipment (position) (OT)  LTG modified to mod A with max A with sit to stand at sink to wash bottom due to limited progress.  Problem: RH Dressing Goal: LTG Patient will perform upper body dressing (OT) LTG Patient will perform upper body dressing with assist, with/without cues (OT).  LTG modified to mod A due to limited progress. Goal: LTG Patient will perform lower body dressing w/assist (OT) LTG: Patient will perform lower body dressing with assist, with/without cues in positioning using equipment (OT)  LTG modified to max A with max A to sit to stand due to limited progress.  Problem: RH Toileting Goal: LTG Patient will perform toileting w/assist, cues/equip (OT) LTG: Patient will perform toiletiing (clothes management/hygiene) with assist, with/without cues using equipment (OT)  LTG modified to total A with max A to sit to stand from toilet to assist her caregiver due to limited progress.  Problem: RH Toilet Transfers Goal: LTG Patient will perform toilet transfers w/assist (OT) LTG: Patient will perform toilet transfers with assist, with/without cues using equipment (OT)  LTG modified to max A due to limited progress.

## 2015-08-04 NOTE — Progress Notes (Signed)
Occupational Therapy Session Note  Patient Details  Name: Gail Chapman MRN: [id number] Date of Birth: 1956-04-25  Today's Date: 08/04/2015 OT Individual Time: 0700-0800 OT Individual Time Calculation (min): 60 min    Short Term Goals: Week 2:  OT Short Term Goal 1 (Week 2): Pt will bathe UB with min A OT Short Term Goal 2 (Week 2): Pt will transfer to toilet with squat pivot of max A + 1 OT Short Term Goal 3 (Week 2): Pt will don shirt with mod A OT Short Term Goal 4 (Week 2): Pt will initiate and brush teeth with supervision and min verbal cues  Skilled Therapeutic Interventions/Progress Updates:    Pt resting in bed with husband present.  Pt's husband left short after beginning of session.  Pt had refused bathing at night and focus of session was bathing and dressing at bed level.  Pt required max verbal cues to roll to right and left to facilitate LB bathing and donning of pants.  Pt required max A for rolling and max A to maintain side lying position. Pt required max multimodal cues to initiate sitting EOB and max A to complete task.  Pt required max A for squat pivot transfer to w/c and tot A for positioning properly in w/c.  Pt engaged in grooming tasks seated in w/c at sink.  Pt brushed teeth requiring more than a reasonable amount of time to complete tasks.  Pt attempted to comb hair but was unable to bring UE to correct position to perform task.  Focus on activity tolerance, bed mobility, sitting balance, functional transfers, grooming tasks, task initiation, attention to task, and safety awareness.  Therapy Documentation Precautions:  Precautions Precautions: Fall Precaution Comments: hx of MS with baseline visual changes Restrictions Weight Bearing Restrictions: No Pain: Pain Assessment Pain Assessment: Faces Faces Pain Scale: Hurts little more Pain Location: Back Pain Orientation: Left;Upper Pain Descriptors / Indicators: Aching Pain Onset: On-going Pain  Intervention(s): Repositioned  See Function Navigator for Current Functional Status.   Therapy/Group: Individual Therapy  Rich Brave 08/04/2015, 8:03 AM

## 2015-08-04 NOTE — Progress Notes (Signed)
Physical Therapy Session Note  Patient Details  Name: Gail Chapman MRN: [id number] Date of Birth: [birth year]  Today's Date: 08/04/2015 PT Individual Time: 1300-1355 PT Individual Time Calculation (min): 55 min   Short Term Goals: Week 2:  PT Short Term Goal 1 (Week 2): Pt will sustain attention to functional task x 2 minutes with max multimodal cues.  PT Short Term Goal 2 (Week 2): Patient will transfer bed <> wheelchair with consistent max A x 1.  PT Short Term Goal 3 (Week 2): Patient will propel wheelchair x 50 ft with supervision and max multimodal cues.  PT Short Term Goal 4 (Week 2): Patient will transfer sit <> stand with max A x 1.  PT Short Term Goal 5 (Week 2): Patient will perform bed mobility with consistent max A x 1.   Skilled Therapeutic Interventions/Progress Updates:   Session focused on initiation and sustained attention to familiar functional tasks including eating with supervision and washing hands with wash cloth, postural control and sitting balance, and muscle extensibility. Performed squat pivot transfers with max-total A x 1 wheelchair <> mat table. Attempted seated reaching task to facilitate upright posture and midline positioning with minimal effect as reaching tended to throw patient into more forward and lateral flexion to L, unable to correct to midline without assistance. Supine attempted BLE therex for strengthening but patient unable to tolerate position due to back pain. Instructed patient in rolling to R with focus on turning head to R, bringing LUE across body, and pushing through LLE in hooklying position with max A. Patient with significantly decreased cervical ROM to R due to neck being laterally flexed to L and forward head position, attempted gentle PROM but patient unable to tolerate. Patient returned to wheelchair and left semi reclined in bed with all needs within reach.   Therapy Documentation Precautions:  Precautions Precautions:  Fall Precaution Comments: hx of MS with baseline visual changes Restrictions Weight Bearing Restrictions: No Pain: Pain Assessment Pain Assessment: 0-10 Pain Score: 8  Pain Type: Acute pain Pain Location: Back Pain Orientation: Left;Upper Pain Descriptors / Indicators: Aching;Grimacing;Discomfort Pain Onset: With Activity Pain Intervention(s): Repositioned;Rest   See Function Navigator for Current Functional Status.   Therapy/Group: Individual Therapy  Kerney Elbe 08/04/2015, 1:58 PM

## 2015-08-04 NOTE — Progress Notes (Signed)
Occupational Therapy Note  Patient Details  Name: Gail Chapman MRN: [id number] Date of Birth: [birth year]  Today's Date: 08/04/2015 OT Individual Time: 1400-1430 OT Individual Time Calculation (min): 30 min   Pt c/o 8/10 pain in left upper back; repositioned and rest Individual therapy  Pt engaged in table activity (Connect 4) with focus on task initiation, sequencing, attention to task, increased BUE use.  Pt verbalized understanding of goal/rules of game and initiated and completed placing tokens into slot X 6.  Pt required max verbal cues to place token in slot to prevent therapist from winning game.  Pt attempted to place token in a different slot even after therapist demonstrated the correct slot to place token.  Pt exhibited increasing difficulty with raising hand high enough to place token in slots and required increasingly more assistance to complete task. Pt returned to room and remained in w/c with QRB in place   Rich Brave 08/04/2015, 2:47 PM

## 2015-08-04 NOTE — Progress Notes (Signed)
Late entry for 08-02-15. Patient husband reported he did not want patient receiving full dose of depakote until RN reviewed with MD. Reviewed request to decrease dose with P. Love, PA. No new orders received from PA. Verbal report given to Dr. Briant Cedar in regards to dialysis. Patient took renevela on Friday, 07-30-15 but refused dose on Monday, 08-02-15. Patient's husband reported depakote causes diarrhea. Patient did have 1 episode of non formed BM with RN present. No other loose BM's noted during shift. Patient's husband reported we have changed her medications.  Reviewed with patient's husband rationale for giving medications and requested husband to bring in medications currently being given per scribed by outpatient dialysis center. P. Love aware.   Continue with plan of care. Cleotilde Neer

## 2015-08-04 NOTE — Progress Notes (Signed)
Hypoglycemic Event  CBG: 62  Treatment: 15 GM carbohydrate snack  Symptoms: None  Follow-up CBG: Time:0715 CBG Result:103  Possible Reasons for Event: Inadequate meal intake  Comments/MD notified:Dan Angiulli, PA    Lurlean Leyden, Shoua Ressler J

## 2015-08-04 NOTE — Progress Notes (Signed)
Speech Language Pathology Daily Session Note  Patient Details  Name: Gail Chapman MRN: [id number] Date of Birth: [birth year]  Today's Date: 08/04/2015 SLP Individual Time: 0900-1000 SLP Individual Time Calculation (min): 60 min  Short Term Goals: Week 2: SLP Short Term Goal 1 (Week 2): Pt will consume her currenty prescribed diet with min verbal cues for use of swallowing precautions and minimal overt s/s of aspiration.  SLP Short Term Goal 2 (Week 2): Pt will initiate functional tasks with no more than 2 verbal cues in 50% of opportunities SLP Short Term Goal 3 (Week 2): Pt will complete basic problem solving tasks with 80% accuracy with Max cues SLP Short Term Goal 4 (Week 2): Pt will sustain attention to functional task for 2 minutes with Max cues SLP Short Term Goal 5 (Week 2): Pt will utilize external memory aids to facilitate recall of new and daily information with Max cues SLP Short Term Goal 6 (Week 2): Pt will verbalize at least 1 physical and 2 cognitive deficits with Max cues  Skilled Therapeutic Interventions: Skilled treatment session focused on cognitive goals. SLP facilitated session by initially providing extra time and Min A verbal cues for initiation of a basic task and Mod A for problem solving with task. Patient initially attended to task for ~3 minutes with Min A verbal cues, however, by end of session, patient required Max A multimodal cues for initiation, attention and problem solving with task, suspect due to fatigue. Patient left upright in wheelchair with quick release belt in place and all needs within reach. Continue with current plan of care.    Function:  Cognition Comprehension Comprehension assist level: Understands basic 50 - 74% of the time/ requires cueing 25 - 49% of the time  Expression   Expression assist level: Expresses basic 25 - 49% of the time/requires cueing 50 - 75% of the time. Uses single words/gestures.  Social Interaction Social Interaction  assist level: Interacts appropriately 75 - 89% of the time - Needs redirection for appropriate language or to initiate interaction.  Problem Solving Problem solving assist level: Solves basic 25 - 49% of the time - needs direction more than half the time to initiate, plan or complete simple activities  Memory Memory assist level: Recognizes or recalls 25 - 49% of the time/requires cueing 50 - 75% of the time    Pain Pain Assessment Pain Assessment: No/denies pain Pain Score: 0-No pain  Therapy/Group: Individual Therapy  Gail Chapman 08/04/2015, 12:12 PM

## 2015-08-04 NOTE — Progress Notes (Signed)
Patient has had loose liquid stools for the past 2 days.  Husband/family  seems to think this is related to depakote. Likely antibiotic related. She has had recurrent elevation in WBC and will rule out c diff

## 2015-08-04 NOTE — Progress Notes (Signed)
Patient has had loose stools for a few days.  WBC elevated as of yesterday afternoon.  Order obtained to collect stool for c.diff testing.  Patient was in agreement for treatment.  Patient is also in agreement to go for dialysis today, even though she went yesterday, in order to get back on her MWF schedule.  Dani Gobble, RN

## 2015-08-04 NOTE — Progress Notes (Signed)
[facility] Progress Note  Subjective/Complaints: Pt seen this AM working with therapies.  Husband is not at bedside this morning.  She states she is doing well and does not have any complaints.  She would like to return home.    ROS: limited due to cognition.  However appears to deny CP, SOB, n/v/d.   Objective: Vital Signs: Blood pressure 168/71, pulse 70, temperature 98.1 F (36.7 C), temperature source Oral, resp. rate 16, height 5' 1.5" (1.562 m), weight 63.9 kg (140 lb 14 oz), SpO2 99 %. No results found. Results for orders placed or performed during the hospital encounter of 07/26/15 (from the past 72 hour(s))  Glucose, capillary     Status: Abnormal   Collection Time: 08/01/15 11:23 AM  Result Value Ref Range   Glucose-Capillary 108 (H) 65 - 99 mg/dL  Glucose, capillary     Status: None   Collection Time: 08/01/15  4:37 PM  Result Value Ref Range   Glucose-Capillary 89 65 - 99 mg/dL  Glucose, capillary     Status: Abnormal   Collection Time: 08/01/15  9:45 PM  Result Value Ref Range   Glucose-Capillary 118 (H) 65 - 99 mg/dL  Glucose, capillary     Status: None   Collection Time: 08/02/15  6:59 AM  Result Value Ref Range   Glucose-Capillary 91 65 - 99 mg/dL  Glucose, capillary     Status: None   Collection Time: 08/02/15 11:38 AM  Result Value Ref Range   Glucose-Capillary 89 65 - 99 mg/dL  Glucose, capillary     Status: None   Collection Time: 08/02/15  4:48 PM  Result Value Ref Range   Glucose-Capillary 80 65 - 99 mg/dL  Urine culture     Status: None (Preliminary result)   Collection Time: 08/02/15  8:54 PM  Result Value Ref Range   Specimen Description URINE, CLEAN CATCH    Special Requests NONE    Culture NO GROWTH < 12 HOURS    Report Status PENDING   Glucose, capillary     Status: None   Collection Time: 08/02/15  9:06 PM  Result Value Ref Range   Glucose-Capillary 94 65 - 99 mg/dL   Comment 1 Notify RN   Glucose, capillary      Status: None   Collection Time: 08/03/15  6:52 AM  Result Value Ref Range   Glucose-Capillary 85 65 - 99 mg/dL   Comment 1 Notify RN   Glucose, capillary     Status: None   Collection Time: 08/03/15 11:44 AM  Result Value Ref Range   Glucose-Capillary 73 65 - 99 mg/dL   Comment 1 Notify RN   Renal function panel     Status: Abnormal   Collection Time: 08/03/15  4:15 PM  Result Value Ref Range   Sodium 136 135 - 145 mmol/L   Potassium 4.7 3.5 - 5.1 mmol/L   Chloride 97 (L) 101 - 111 mmol/L   CO2 23 22 - 32 mmol/L   Glucose, Bld 114 (H) 65 - 99 mg/dL   BUN 74 (H) 6 - 20 mg/dL   Creatinine, Ser 6.93 (H) 0.44 - 1.00 mg/dL   Calcium 8.6 (L) 8.9 - 10.3 mg/dL   Phosphorus 9.3 (H) 2.5 - 4.6 mg/dL   Albumin 2.5 (L) 3.5 - 5.0 g/dL   GFR calc non Af Amer 6 (L) >60 mL/min   GFR calc Af Amer 7 (L) >60 mL/min    Comment: (NOTE) The eGFR  has been calculated using the CKD EPI equation. This calculation has not been validated in all clinical situations. eGFR's persistently <60 mL/min signify possible Chronic Kidney Disease.    Anion gap 16 (H) 5 - 15  CBC     Status: Abnormal   Collection Time: 08/03/15  4:15 PM  Result Value Ref Range   WBC 18.0 (H) 4.0 - 10.5 K/uL   RBC 3.61 (L) 3.87 - 5.11 MIL/uL   Hemoglobin 10.3 (L) 12.0 - 15.0 g/dL   HCT 32.4 (L) 36.0 - 46.0 %   MCV 89.8 78.0 - 100.0 fL   MCH 28.5 26.0 - 34.0 pg   MCHC 31.8 30.0 - 36.0 g/dL   RDW 15.9 (H) 11.5 - 15.5 %   Platelets 110 (L) 150 - 400 K/uL    Comment: REPEATED TO VERIFY CONSISTENT WITH PREVIOUS RESULT   Glucose, capillary     Status: None   Collection Time: 08/03/15  8:09 PM  Result Value Ref Range   Glucose-Capillary 80 65 - 99 mg/dL  Glucose, capillary     Status: Abnormal   Collection Time: 08/04/15  6:49 AM  Result Value Ref Range   Glucose-Capillary 62 (L) 65 - 99 mg/dL  Glucose, capillary     Status: Abnormal   Collection Time: 08/04/15  7:15 AM  Result Value Ref Range   Glucose-Capillary 103 (H)  65 - 99 mg/dL     Gen: NAD.  VS reviewed.  HEENT: Normocephalic, atraumatic Eyes: Conj WNL, EOMI Cardio: RRR and murmur Resp: CTA B/L and unlabored GI: BS positive and NT, ND Skin:   Intact. Warm and Dry. MSK:   Likely 4/5 grossly throughout, however, poor effort.   Neurological: She is alert and oriented x3.   Patient disoriented. Lacks insight and awareness of deficits. Able to follow simple motor commands without difficulty.  Skin: Skin is warm and dry. No rash noted. She is not diaphoretic. No erythema.  Psychiatric: Her affect is blunt. Her speech is delayed. She is slowed. Cognition and memory are impaired.    Assessment/Plan: 1. 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 H/o MS with chronic paraplegia.    which require 3+ hours per day of interdisciplinary therapy in a comprehensive inpatient rehab setting. Physiatrist is providing close team supervision and 24 hour management of active medical problems listed below. Physiatrist and rehab team continue to assess barriers to discharge/monitor patient progress toward functional and medical goals. FIM: Function - Bathing Bathing activity did not occur: Refused Position: Bed Body parts bathed by patient: Chest, Abdomen, Right arm, Left arm Body parts bathed by helper: Front perineal area, Buttocks, Right upper leg, Left upper leg, Right lower leg, Left lower leg, Back Assist Level: 2 helpers  Function- Upper Body Dressing/Undressing What is the patient wearing?: Pull over shirt/dress Pull over shirt/dress - Perfomed by patient: Thread/unthread right sleeve, Thread/unthread left sleeve, Put head through opening Pull over shirt/dress - Perfomed by helper: Pull shirt over trunk Assist Level: Touching or steadying assistance(Pt > 75%) Function - Lower Body Dressing/Undressing What is the patient wearing?: Underwear, Pants, Non-skid slipper socks Position: Bed Underwear - Performed  by helper: Thread/unthread right underwear leg, Thread/unthread left underwear leg, Pull underwear up/down Pants- Performed by helper: Thread/unthread right pants leg, Thread/unthread left pants leg, Pull pants up/down, Fasten/unfasten pants Non-skid slipper socks- Performed by helper: Don/doff right sock, Don/doff left sock Assist for footwear: Dependant Assist for lower body dressing: 2 Helpers  Function - Toileting  Toileting activity did not occur: N/A Toileting steps completed by helper: Performs perineal hygiene, Adjust clothing after toileting, Adjust clothing prior to toileting Toileting Assistive Devices: Grab bar or rail Assist level: Two helpers  Function - Air cabin crew transfer activity did not occur: N/A Toilet transfer assistive device: Elevated toilet seat/BSC over toilet, Grab bar Mechanical lift: Stedy Assist level to toilet: Moderate assist (Pt 50 - 74%/lift or lower) Assist level from toilet: Maximal assist (Pt 25 - 49%/lift and lower)  Function - Chair/bed transfer Chair/bed transfer method: Squat pivot Chair/bed transfer assist level: 2 helpers Chair/bed transfer assistive device: Armrests Chair/bed transfer details: Verbal cues for sequencing, Tactile cues for initiation, Tactile cues for sequencing, Manual facilitation for weight shifting, Manual facilitation for placement, Verbal cues for technique  Function - Locomotion: Wheelchair Will patient use wheelchair at discharge?: Yes Type: Manual Max wheelchair distance: 2 Assist Level: Total assistance (Pt < 25%) Wheel 50 feet with 2 turns activity did not occur: Safety/medical concerns Wheel 150 feet activity did not occur: Safety/medical concerns Turns around,maneuvers to table,bed, and toilet,negotiates 3% grade,maneuvers on rugs and over doorsills: No Function - Locomotion: Ambulation Ambulation activity did not occur: Safety/medical concerns (lethargic, unable to maintain static standing using RW >  5 sec with max A x 2 before sitting) Assistive device: Parallel bars Max distance: 8 Assist level: Moderate assist (Pt 50 - 74%) Walk 10 feet activity did not occur: Safety/medical concerns Walk 50 feet with 2 turns activity did not occur: Safety/medical concerns Walk 150 feet activity did not occur: Safety/medical concerns Walk 10 feet on uneven surfaces activity did not occur: Safety/medical concerns  Function - Comprehension Comprehension: Auditory Comprehension assist level: Understands basic 50 - 74% of the time/ requires cueing 25 - 49% of the time  Function - Expression Expression: Verbal Expression assist level: Expresses basic 25 - 49% of the time/requires cueing 50 - 75% of the time. Uses single words/gestures.  Function - Social Interaction Social Interaction assist level: Interacts appropriately 75 - 89% of the time - Needs redirection for appropriate language or to initiate interaction.  Function - Problem Solving Problem solving assist level: Solves basic 25 - 49% of the time - needs direction more than half the time to initiate, plan or complete simple activities  Function - Memory Memory assist level: Recognizes or recalls 25 - 49% of the time/requires cueing 50 - 75% of the time Patient normally able to recall (first 3 days only): That he or she is in a hospital, Current season  Medical Problem List and Plan: 1. 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 H/o MS.    2.  DVT Prophylaxis/Anticoagulation: SCDs ordered 10/21. 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  -Pt's husband refusing medication, will cont to educate  -SRA pending 3. Pain Management: Tylenol prn   4. Mood: LCSW to follow for evaluation and support.   5. Neuropsych: This patient is not capable of making decisions on her own behalf. 6. Skin/Wound Care: Routine pressure relief  measures 7. Fluids/Electrolytes/Nutrition: Monitor I/O. 1200 cc FR.  Labs with HD.   8. 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    9. HTN: Need to prevent hypotension to prevent recurrent watershed infarcts.  Labile pressures, due to husband holding medications.  Will cont to monitor and educate pt's husband regarding holding medications.   10. ESRD: Has history of orhtostatic changes.  11. OSA: Continue CPAP at bedtime/sleeping.   12. E coli Sepsis: continue Rocephin.   13. Arrthymias:  Husband refusing Coreg--he holds her BP meds at home if SBP < 180. He has refused multiple doses of coreg. Relayed reported episodes of  asymptomatic VT and need to continue coreg. Will set hold parameter on Cardura.   14. DM type 2: Husband does not want BS covered if under 120.  Will monitor BS ac/hs. BS well controlled off lantus. Will continue SSI for now.  Fasting FSBG 103 this AM. 15. COPD: Uses oxygen prn with activity.  16. Anemia: Hb 10.3 on 10/25. Stable 17. Leukocytosis: Ucx pending. Will cont to monitor. 18. Thrombocytopenia: Now trending up after d/cing Heparin Plts 110 on 10/25.    19. Seizures: Will maintain current dose of Depakote as it was decreased to 750 from 1000 and Keppra was d/ced.  Educated on reason and importance of medication.    LOS (Days) 9 A FACE TO FACE EVALUATION WAS PERFORMED  Ankit Lorie Phenix 08/04/2015, 9:07 AM

## 2015-08-05 ENCOUNTER — Inpatient Hospital Stay (HOSPITAL_COMMUNITY): Payer: Medicare Other | Admitting: Physical Therapy

## 2015-08-05 ENCOUNTER — Inpatient Hospital Stay (HOSPITAL_COMMUNITY): Payer: Medicare Other | Admitting: Speech Pathology

## 2015-08-05 ENCOUNTER — Ambulatory Visit (HOSPITAL_COMMUNITY): Payer: BLUE CROSS/BLUE SHIELD | Admitting: Occupational Therapy

## 2015-08-05 ENCOUNTER — Inpatient Hospital Stay (HOSPITAL_COMMUNITY): Payer: Medicare Other | Admitting: *Deleted

## 2015-08-05 DIAGNOSIS — Z992 Dependence on renal dialysis: Secondary | ICD-10-CM

## 2015-08-05 DIAGNOSIS — I6349 Cerebral infarction due to embolism of other cerebral artery: Secondary | ICD-10-CM

## 2015-08-05 DIAGNOSIS — R569 Unspecified convulsions: Secondary | ICD-10-CM

## 2015-08-05 DIAGNOSIS — N186 End stage renal disease: Secondary | ICD-10-CM

## 2015-08-05 LAB — GLUCOSE, CAPILLARY
GLUCOSE-CAPILLARY: 83 mg/dL (ref 65–99)
GLUCOSE-CAPILLARY: 127 mg/dL — AB (ref 65–99)
Glucose-Capillary: 85 mg/dL (ref 65–99)
Glucose-Capillary: 114 mg/dL — ABNORMAL HIGH (ref 65–99)
Glucose-Capillary: 145 mg/dL — ABNORMAL HIGH (ref 65–99)

## 2015-08-05 NOTE — Progress Notes (Signed)
Occupational Therapy Session Note  Patient Details  Name: Gail Chapman MRN: [id number] Date of Birth: January 24, 1956  Today's Date: 08/05/2015 OT Individual Time: 0700-0800 OT Individual Time Calculation (min): 60 min    Short Term Goals: Week 2:  OT Short Term Goal 1 (Week 2): Pt will bathe UB with min A OT Short Term Goal 2 (Week 2): Pt will transfer to toilet with squat pivot of max A + 1 OT Short Term Goal 3 (Week 2): Pt will don shirt with mod A OT Short Term Goal 4 (Week 2): Pt will initiate and brush teeth with supervision and min verbal cues  Skilled Therapeutic Interventions/Progress Updates:    Pt asleep in bed upon arrival with husband also present and asleep in recliner.  Pt required mod multimodal cues and extra time to arouse.  Pt agreeable to therapy.  Pt's husband remained in room but remained in recliner with hat pulled over eyes for approx 50% of session.  Pt's husband did not actively participate in BADLs at bed level or in w/c. Pt engaged in BADL retraining including LB bathing and dressing at bed level and UB bathing and dressing seated in w/c at sink.  Pt continues to require max verbal cues for task initiation and attention to task.  Pt initiates task with verbal cue but requires continued cueing to continue task.  Pt was able to don her shirt this morning with min A.  Focus on activity tolerance, bed mobility, sitting balance, functional transfers, task initiation, attention to task, sequencing, and safety awareness to increased independence with BADLs and decrease burden of care.   Therapy Documentation Precautions:  Precautions Precautions: Fall Precaution Comments: hx of MS with baseline visual changes Restrictions Weight Bearing Restrictions: No Vital Signs: Therapy Vitals Pulse Rate: 72 BP: (!) 140/58 mmHg Pain: Pain Assessment Pain Assessment: No/denies pain Faces Pain Scale: Hurts little more Pain Location: Back Pain Orientation: Upper;Left Pain  Descriptors / Indicators: Aching;Grimacing;Discomfort Pain Onset: With Activity Pain Intervention(s): Repositioned;Rest  See Function Navigator for Current Functional Status.   Therapy/Group: Individual Therapy  Rich Brave 08/05/2015, 11:05 AM

## 2015-08-05 NOTE — Progress Notes (Signed)
S: No new CO O:BP 140/58 mmHg  Pulse 72  Temp(Src) 98.9 F (37.2 C) (Oral)  Resp 18  Ht 5' 1.5" (1.562 m)  Wt 63.5 kg (139 lb 15.9 oz)  BMI 26.03 kg/m2  SpO2 97%  Intake/Output Summary (Last 24 hours) at 08/05/15 0927 Last data filed at 08/05/15 0700  Gross per 24 hour  Intake    480 ml  Output   1800 ml  Net  -1320 ml   Weight change: 0 kg (0 lb) IRJ:JOACZ and alert CVS:RRR 2/6 systolic M Resp:Clear Abd:+ BS NTND Ext:no edema NEURO: No asterixis  Lt IJ PC   . antiseptic oral rinse  7 mL Mouth Rinse BID  . aspirin EC  325 mg Oral Daily  . calcitRIOL  0.25 mcg Oral Q M,W,F-HD  . carvedilol  12.5 mg Oral BID WC  . darbepoetin (ARANESP) injection - DIALYSIS  60 mcg Intravenous Q Wed-HD  . divalproex  750 mg Oral Q12H  . doxazosin  2 mg Oral QHS  . feeding supplement (NEPRO CARB STEADY)  237 mL Oral BID BM  . insulin aspart  0-9 Units Subcutaneous TID WC  . multivitamin  1 tablet Oral QHS  . pantoprazole  40 mg Oral QHS  . pravastatin  40 mg Oral q1800  . sevelamer carbonate  800 mg Oral TID WC   No results found. BMET    Component Value Date/Time   NA 136 08/03/2015 1615   K 4.7 08/03/2015 1615   CL 97* 08/03/2015 1615   CO2 23 08/03/2015 1615   GLUCOSE 114* 08/03/2015 1615   BUN 74* 08/03/2015 1615   CREATININE 6.93* 08/03/2015 1615   CALCIUM 8.6* 08/03/2015 1615   GFRNONAA 6* 08/03/2015 1615   GFRAA 7* 08/03/2015 1615   CBC    Component Value Date/Time   WBC 18.0* 08/03/2015 1615   RBC 3.61* 08/03/2015 1615   HGB 10.3* 08/03/2015 1615   HCT 32.4* 08/03/2015 1615   PLT 110* 08/03/2015 1615   MCV 89.8 08/03/2015 1615   MCH 28.5 08/03/2015 1615   MCHC 31.8 08/03/2015 1615   RDW 15.9* 08/03/2015 1615   LYMPHSABS 2.0 07/31/2015 0511   MONOABS 1.3* 07/31/2015 0511   EOSABS 0.3 07/31/2015 0511   BASOSABS 0.0 07/31/2015 0511     Assessment: 1. ESRD  MWF [place] 2. HTN 3. Seizure sec Stroke 4. Thrombocytopenia HIT +  Plan: 1. Plan HD tomorrow  and will recheck labs.  Will also get BC x 2 due to high WBC and the fact that she has a catheter  Siddalee Vanderheiden T

## 2015-08-05 NOTE — Progress Notes (Signed)
Social Work Patient ID: Gail Chapman, female   DOB: August 09, 1956, 59 y.o.   MRN: [id number]   Met with pt and husband this morning to review team conference.  Both understand that targeted d/c date had been set for 11/1 with moderate assistance goals, however, husband states, "I'd like to see that be sooner...maybe Saturday or Sunday...".   Explained that we need to complete family education with all caregivers.  Husband agrees to stay through the morning and attend PT and ST sessions.  Therapies reporting that husband displayed safe techniques with pt care (providing more assist than actually needed at times.)  Therapies and MD (including Renal MD) agreeable with change of d/c to Sat 10/29.  Husband unsure if other family members (dtr and sister) will be able to attend therapy sessions and he is prepared to share training info with them.  Husband continues to focus on claims that "the hospital caused this (CVA)".  He has strong opinions about medications being given to pt - MD aware.  Pt with cognitive deficits but reports she does feel that her husband and family will take "good care of me...".  I will refer for [facility] services to include HHSW to monitor home situation per RN concerns of yesterday.  Continue to follow.  Damari Hiltz, LCSW

## 2015-08-05 NOTE — Progress Notes (Signed)
[facility] Progress Note  Subjective/Complaints: Pt up in chair. No diarrhea. Does have some chest soreness. Husband sitting disgruntledly at bedise.    ROS: limited due to cognition.  However appears to deny CP, SOB, n/v/d.   Objective: Vital Signs: Blood pressure 140/58, pulse 72, temperature 98.9 F (37.2 C), temperature source Oral, resp. rate 18, height 5' 1.5" (1.562 m), weight 63.5 kg (139 lb 15.9 oz), SpO2 97 %. No results found. Results for orders placed or performed during the hospital encounter of 07/26/15 (from the past 72 hour(s))  Glucose, capillary     Status: None   Collection Time: 08/02/15 11:38 AM  Result Value Ref Range   Glucose-Capillary 89 65 - 99 mg/dL  Glucose, capillary     Status: None   Collection Time: 08/02/15  4:48 PM  Result Value Ref Range   Glucose-Capillary 80 65 - 99 mg/dL  Urine culture     Status: None   Collection Time: 08/02/15  8:54 PM  Result Value Ref Range   Specimen Description URINE, CLEAN CATCH    Special Requests NONE    Culture MULTIPLE SPECIES PRESENT, SUGGEST RECOLLECTION    Report Status 08/04/2015 FINAL   Glucose, capillary     Status: None   Collection Time: 08/02/15  9:06 PM  Result Value Ref Range   Glucose-Capillary 94 65 - 99 mg/dL   Comment 1 Notify RN   Glucose, capillary     Status: None   Collection Time: 08/03/15  6:52 AM  Result Value Ref Range   Glucose-Capillary 85 65 - 99 mg/dL   Comment 1 Notify RN   Glucose, capillary     Status: None   Collection Time: 08/03/15 11:44 AM  Result Value Ref Range   Glucose-Capillary 73 65 - 99 mg/dL   Comment 1 Notify RN   Renal function panel     Status: Abnormal   Collection Time: 08/03/15  4:15 PM  Result Value Ref Range   Sodium 136 135 - 145 mmol/L   Potassium 4.7 3.5 - 5.1 mmol/L   Chloride 97 (L) 101 - 111 mmol/L   CO2 23 22 - 32 mmol/L   Glucose, Bld 114 (H) 65 - 99 mg/dL   BUN 74 (H) 6 - 20 mg/dL   Creatinine, Ser 6.93 (H)  0.44 - 1.00 mg/dL   Calcium 8.6 (L) 8.9 - 10.3 mg/dL   Phosphorus 9.3 (H) 2.5 - 4.6 mg/dL   Albumin 2.5 (L) 3.5 - 5.0 g/dL   GFR calc non Af Amer 6 (L) >60 mL/min   GFR calc Af Amer 7 (L) >60 mL/min    Comment: (NOTE) The eGFR has been calculated using the CKD EPI equation. This calculation has not been validated in all clinical situations. eGFR's persistently <60 mL/min signify possible Chronic Kidney Disease.    Anion gap 16 (H) 5 - 15  CBC     Status: Abnormal   Collection Time: 08/03/15  4:15 PM  Result Value Ref Range   WBC 18.0 (H) 4.0 - 10.5 K/uL   RBC 3.61 (L) 3.87 - 5.11 MIL/uL   Hemoglobin 10.3 (L) 12.0 - 15.0 g/dL   HCT 32.4 (L) 36.0 - 46.0 %   MCV 89.8 78.0 - 100.0 fL   MCH 28.5 26.0 - 34.0 pg   MCHC 31.8 30.0 - 36.0 g/dL   RDW 15.9 (H) 11.5 - 15.5 %   Platelets 110 (L) 150 - 400 K/uL    Comment: REPEATED  TO VERIFY CONSISTENT WITH PREVIOUS RESULT   Glucose, capillary     Status: None   Collection Time: 08/03/15  8:09 PM  Result Value Ref Range   Glucose-Capillary 80 65 - 99 mg/dL  Glucose, capillary     Status: Abnormal   Collection Time: 08/04/15  6:49 AM  Result Value Ref Range   Glucose-Capillary 62 (L) 65 - 99 mg/dL  Glucose, capillary     Status: Abnormal   Collection Time: 08/04/15  7:15 AM  Result Value Ref Range   Glucose-Capillary 103 (H) 65 - 99 mg/dL  Glucose, capillary     Status: Abnormal   Collection Time: 08/04/15 11:25 AM  Result Value Ref Range   Glucose-Capillary 107 (H) 65 - 99 mg/dL  Glucose, capillary     Status: None   Collection Time: 08/04/15  7:24 PM  Result Value Ref Range   Glucose-Capillary 87 65 - 99 mg/dL  Glucose, capillary     Status: None   Collection Time: 08/05/15  6:49 AM  Result Value Ref Range   Glucose-Capillary 85 65 - 99 mg/dL     Gen: NAD.  VS reviewed.  HEENT: Normocephalic, atraumatic Eyes: Conj WNL, EOMI Cardio: RRR and murmur Resp: CTA B/L and unlabored GI: BS positive and NT, ND Skin:   Intact. Warm  and Dry. MSK:   Likely 4/5 grossly throughout, however, poor effort.   Neurological: She is alert and oriented x3.   Patient disoriented. Lacks insight and awareness of deficits. Able to follow simple motor commands without difficulty.  Skin: Skin is warm and dry. No rash noted. She is not diaphoretic. No erythema.  Psychiatric: Her affect is blunt. Her speech is delayed. She is slowed. Cognition and memory are impaired.    Assessment/Plan: 1. 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 H/o MS with chronic paraplegia.    which require 3+ hours per day of interdisciplinary therapy in a comprehensive inpatient rehab setting. Physiatrist is providing close team supervision and 24 hour management of active medical problems listed below. Physiatrist and rehab team continue to assess barriers to discharge/monitor patient progress toward functional and medical goals. FIM: Function - Bathing Bathing activity did not occur: Refused Position: Bed Body parts bathed by patient: Chest, Abdomen, Right arm, Left arm Body parts bathed by helper: Front perineal area, Buttocks, Right upper leg, Left upper leg, Right lower leg, Left lower leg, Back Assist Level: 2 helpers  Function- Upper Body Dressing/Undressing What is the patient wearing?: Pull over shirt/dress Pull over shirt/dress - Perfomed by patient: Thread/unthread right sleeve, Thread/unthread left sleeve, Put head through opening Pull over shirt/dress - Perfomed by helper: Pull shirt over trunk Assist Level: Touching or steadying assistance(Pt > 75%) Function - Lower Body Dressing/Undressing What is the patient wearing?: Underwear, Pants, Non-skid slipper socks Position: Bed Underwear - Performed by helper: Thread/unthread right underwear leg, Thread/unthread left underwear leg, Pull underwear up/down Pants- Performed by helper: Thread/unthread right pants leg, Thread/unthread left pants leg, Pull  pants up/down, Fasten/unfasten pants Non-skid slipper socks- Performed by helper: Don/doff right sock, Don/doff left sock Assist for footwear: Dependant Assist for lower body dressing: 2 Helpers  Function - Toileting Toileting activity did not occur: N/A Toileting steps completed by helper: Adjust clothing prior to toileting, Performs perineal hygiene, Adjust clothing after toileting Toileting Assistive Devices: Grab bar or rail Assist level: Two helpers  Function - Air cabin crew transfer activity did not occur: N/A Toilet transfer assistive device: Elevated  toilet seat/BSC over toilet, Grab bar Mechanical lift: Stedy Assist level to toilet: Moderate assist (Pt 50 - 74%/lift or lower) Assist level from toilet: Maximal assist (Pt 25 - 49%/lift and lower)  Function - Chair/bed transfer Chair/bed transfer method: Squat pivot Chair/bed transfer assist level: Maximal assist (Pt 25 - 49%/lift and lower) Chair/bed transfer assistive device: Armrests Chair/bed transfer details: Verbal cues for sequencing, Tactile cues for initiation, Tactile cues for sequencing, Manual facilitation for weight shifting, Manual facilitation for placement, Verbal cues for technique  Function - Locomotion: Wheelchair Will patient use wheelchair at discharge?: Yes Type: Manual Max wheelchair distance: 2 Assist Level: Total assistance (Pt < 25%) Wheel 50 feet with 2 turns activity did not occur: Safety/medical concerns Assist Level: Total assistance (Pt < 25%) Wheel 150 feet activity did not occur: Safety/medical concerns Assist Level: Total assistance (Pt < 25%) Turns around,maneuvers to table,bed, and toilet,negotiates 3% grade,maneuvers on rugs and over doorsills: No Function - Locomotion: Ambulation Ambulation activity did not occur: Safety/medical concerns (lethargic, unable to maintain static standing using RW > 5 sec with max A x 2 before sitting) Assistive device: Parallel bars Max distance:  8 Assist level: Moderate assist (Pt 50 - 74%) Walk 10 feet activity did not occur: Safety/medical concerns Walk 50 feet with 2 turns activity did not occur: Safety/medical concerns Walk 150 feet activity did not occur: Safety/medical concerns Walk 10 feet on uneven surfaces activity did not occur: Safety/medical concerns  Function - Comprehension Comprehension: Auditory Comprehension assist level: Understands basic 50 - 74% of the time/ requires cueing 25 - 49% of the time  Function - Expression Expression: Verbal Expression assist level: Expresses basic 25 - 49% of the time/requires cueing 50 - 75% of the time. Uses single words/gestures.  Function - Social Interaction Social Interaction assist level: Interacts appropriately 50 - 74% of the time - May be physically or verbally inappropriate.  Function - Problem Solving Problem solving assist level: Solves basic 25 - 49% of the time - needs direction more than half the time to initiate, plan or complete simple activities  Function - Memory Memory assist level: Recognizes or recalls 50 - 74% of the time/requires cueing 25 - 49% of the time Patient normally able to recall (first 3 days only): That he or she is in a hospital, Current season  Medical Problem List and Plan: 1. 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 H/o MS.   -will work toward Saturday discharge. Husband appeared competent with transfers   2.  DVT Prophylaxis/Anticoagulation: ECASA, SCD's, TEDS  -off heparin 3. Pain Management: Tylenol prn   4. Mood: LCSW to follow for evaluation and support.   5. Neuropsych: This patient is not capable of making decisions on her own behalf. 6. Skin/Wound Care: Routine pressure relief measures 7. Fluids/Electrolytes/Nutrition: Monitor I/O. 1200 cc FR.  Labs with HD.   8. 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    9. HTN: Need to prevent hypotension to prevent  recurrent watershed infarcts.  continue to educate husband on need for bp control   10. ESRD: Has history of orhtostatic changes.   11. OSA: Continue CPAP at bedtime/sleeping.   12. E coli Sepsis: continue Rocephin.   13. Arrthymias:  Husband refusing Coreg--he holds her BP meds at home if SBP < 180. He has refused multiple doses of coreg. Relayed reported episodes of  asymptomatic VT and need to continue coreg. Will set hold parameter on Cardura.  14. DM type 2: Husband does not want BS covered if under 120.  Will monitor BS ac/hs. BS well controlled off lantus insulin---no changes today. 15. COPD: Uses oxygen prn with activity.  16. Anemia: Hb 10.3 on 10/25. Stable 17. Leukocytosis: Ucx with multispecies---will recollect sample today  -no further diarrhea to suggest c diff  -stop ceftriaxone as no temps and has been on since at least 10/17 18. Thrombocytopenia: Now trending up after d/cing heparin. Recheck tomorrow    19. Seizures: continue depakote at $RemoveBef'750mg'[id number]$  bid dose---education important    LOS (Days) 10 A FACE TO FACE EVALUATION WAS PERFORMED  Jeniel Slauson T 08/05/2015, 10:25 AM

## 2015-08-05 NOTE — Progress Notes (Signed)
Speech Language Pathology Daily Session Note  Patient Details  Name: Gail Chapman MRN: [id number] Date of Birth: 10-Jul-1956  Today's Date: 08/05/2015  Session 1: SLP Individual Time: 1000-1030 SLP Individual Time Calculation (min): 30 min   Session 2: SLP Individual Time: 1210-1240 SLP Individual Time Calculation (min): 30 min  Short Term Goals: Week 2: SLP Short Term Goal 1 (Week 2): Pt will consume her currenty prescribed diet with min verbal cues for use of swallowing precautions and minimal overt s/s of aspiration.  SLP Short Term Goal 2 (Week 2): Pt will initiate functional tasks with no more than 2 verbal cues in 50% of opportunities SLP Short Term Goal 3 (Week 2): Pt will complete basic problem solving tasks with 80% accuracy with Max cues SLP Short Term Goal 4 (Week 2): Pt will sustain attention to functional task for 2 minutes with Max cues SLP Short Term Goal 5 (Week 2): Pt will utilize external memory aids to facilitate recall of new and daily information with Max cues SLP Short Term Goal 6 (Week 2): Pt will verbalize at least 1 physical and 2 cognitive deficits with Max cues  Skilled Therapeutic Interventions:  Session 1: Skilled treatment session focused on family education with the patient's husband. SLP facilitated session by providing handouts and verbal education in regards to the patient's current cognitive function and strategies to utilize at home to maximize recall, attention and her overall safety. Patient's husband also educated on patient's current swallowing function, diet recommendations and swallowing compensatory strategies. He verbalized understanding of all information and was able to verbalize how to incorporate strategies at home with encouragement. Throughout education session, patient's husband tangential in regards to patient's medications, current medical situation and "inappropriate" foods in regards to her renal needs, however, he was easily redirected.  Patient left supine in bed with husband present. Continue with current plan of care.    Session 2: Skilled treatment session focused on completion of family education with the patient's husband. SLP facilitated session by providing verbal explanation and demonstration on how to provide appropriate cueing and supervision during meals. Patient demonstrated increased initiation and attention to self-feeding and required overall Min A this session and actually required cueing to decrease impulsivity with self-feeding rate. Patient's husband provided appropriate cueing, therefore, patient's husband is signed off to provide supervision. Patient left upright in wheelchair with husband present. Continue with current plan of care.    Function:  Eating Eating   Modified Consistency Diet: Yes Eating Assist Level: Set up assist for;Supervision or verbal cues   Eating Set Up Assist For: Opening containers;Cutting food       Cognition Comprehension Comprehension assist level: Understands basic 50 - 74% of the time/ requires cueing 25 - 49% of the time  Expression   Expression assist level: Expresses basic 25 - 49% of the time/requires cueing 50 - 75% of the time. Uses single words/gestures.  Social Interaction Social Interaction assist level: Interacts appropriately 75 - 89% of the time - Needs redirection for appropriate language or to initiate interaction.  Problem Solving Problem solving assist level: Solves basic 25 - 49% of the time - needs direction more than half the time to initiate, plan or complete simple activities  Memory Memory assist level: Recognizes or recalls 25 - 49% of the time/requires cueing 50 - 75% of the time    Pain Pain Assessment Pain Assessment: No/denies pain  Therapy/Group: Individual Therapy  Jarelyn Bambach 08/05/2015, 3:19 PM

## 2015-08-05 NOTE — Progress Notes (Signed)
RN attempted to reposition patient with pt refusing to turn. Prior to pt sleeping, RN educated husband and patient on pressure relief to sacrum to prevent any skin breakdown. Allevyn dressing on sacrum for preventative measures. Will continue to educate.

## 2015-08-05 NOTE — Progress Notes (Signed)
Physical Therapy Session Note  Patient Details  Name: Gail Chapman MRN: [id number] Date of Birth: 1956-05-31  Today's Date: 08/05/2015 PT Individual Time: 0900-1000 and 1300-1325 PT Individual Time Calculation (min): 60 min and 25 min   Short Term Goals: Week 2:  PT Short Term Goal 1 (Week 2): Pt will sustain attention to functional task x 2 minutes with max multimodal cues.  PT Short Term Goal 2 (Week 2): Patient will transfer bed <> wheelchair with consistent max A x 1.  PT Short Term Goal 3 (Week 2): Patient will propel wheelchair x 50 ft with supervision and max multimodal cues.  PT Short Term Goal 4 (Week 2): Patient will transfer sit <> stand with max A x 1.  PT Short Term Goal 5 (Week 2): Patient will perform bed mobility with consistent max A x 1.   Skilled Therapeutic Interventions/Progress Updates:   Treatment 1: Family training with patient and husband with focus on stand pivot transfers with max A, short distance ambulation using RW x 10 ft with max A and +2 A to control RW with max multimodal cues for sequencing and safe use of AD, furniture transfers to low compliant couch to simulate home environment with max A, bed mobility on regular bed with max A, and bumping patient up/down steps in wheelchair total A. Patient's husband frequently reporting that patient was "able to do this by herself before coming to hospital" and stating she was "just tired because of the depakote," continued to reinforce patient's current impairments and functional status. Demonstration and appropriate return demonstration of wheelchair parts management including how to put wheelchair in vehicle. Currently recommending +2A for toilet transfers, any attempts at ambulation using RW, car transfers, and bumping patient up/down stairs in wheelchair. Husband reports ramp most likely will be finished after patient discharged home, provided handout for wheelchair bumping in addition to practicing on curb step.  Patient's husband with good carryover of education throughout session but will benefit from continued education regarding allowing patient extra time to complete tasks due to impaired initiation and sustained attention and allowing patient to do as much as possible for herself before providing assistance. Patient fatigued throughout session and requested to return to bed at end of session, left semi reclined in bed with husband at bedside.   Treatment 2: Husband departing upon arrival, patient up in wheelchair. Session focused on initiation and sustained attention with therapeutic tasks including stand pivot/squat pivot transfers and attempting to utilize NuStep using BUE/BLE at level 1. Patient required +2A for transfers for safety and total multimodal cues to initiate/attend to NuStep, requiring therapist to initiate pushing/pulling and patient unable to continue task when assistance decreased/removed. Patient able to verbalize what she was requested to do but was unable to physically do it. Patient left sitting in wheelchair with quick release belt on and call bell in lap.   Therapy Documentation Precautions:  Precautions Precautions: Fall Precaution Comments: hx of MS with baseline visual changes Restrictions Weight Bearing Restrictions: No Pain: Pain Assessment Pain Assessment: No/denies pain   See Function Navigator for Current Functional Status.   Therapy/Group: Individual Therapy  Kerney Elbe 08/05/2015, 11:19 AM

## 2015-08-05 NOTE — Progress Notes (Signed)
Patient's  husband in room this am with patient . Husband refusing some medications and nepro . Patient tolerating therapy, in good spirits. Continue with plan of care .  Cleotilde Neer

## 2015-08-05 NOTE — Plan of Care (Signed)
Problem: RH COGNITION-NURSING Goal: RH STG ANTICIPATES NEEDS/CALLS FOR ASSIST W/ASSIST/CUES STG Anticipates Needs/Calls for Assist With Min Assistance/Cues.  Outcome: Not Progressing Patient does not call for assist .  Problem: RH KNOWLEDGE DEFICIT Goal: RH STG INCREASE KNOWLEDGE OF DIABETES Patient/caregiver will verbalize increase understanding of diabetes, medications, and diet prior to D/C with Min assist from staff.  Outcome: Not Progressing Requires max assist  Goal: RH STG INCREASE KNOWLEDGE OF HYPERTENSION Patient/caregiver will verbalize understanding about hypertension, medications, and diet prior to discharge with min assist from staff.  Outcome: Progressing Patient not verbalizing understanding of medications. Patient 's husband reports he does not want certain medications given. Goal: RH STG INCREASE KNOWLEDGE OF DYSPHAGIA/FLUID INTAKE Patient/caregiver will verbalize increased knowledge of dysphagia diet/fluid intake prior to d/c with min assist from staff.  Outcome: Progressing Patient's husband verbalized understanding of dysphagia.

## 2015-08-05 NOTE — Patient Care Conference (Signed)
Inpatient RehabilitationTeam Conference and Plan of Care Update Date: 08/03/2015   Time: 11:35 AM    Patient Name: Gail Chapman      Medical Record Number: [id number]  Date of Birth: 15-May-1956 Sex: Female         Room/Bed: 4W04C/4W04C-01 Payor Info: Payor: BLUE CROSS BLUE SHIELD / Plan: BCBS OTHER / Product Type: *No Product type* /    Admitting Diagnosis: B CVAs  Admit Date/Time:  07/26/2015 10:55 PM Admission Comments: No comment available   Primary Diagnosis:  Cerebral embolism with cerebral infarction [facility]) Principal Problem: Cerebral embolism with cerebral infarction [facility])  Patient Active Problem List   Diagnosis Date Noted  . Gait disturbance, post-stroke 07/22/2015  . Stroke with cerebral ischemia (HCC)   . Cerebral embolism with cerebral infarction 07/16/2015  . Cerebral thrombosis with cerebral infarction 07/16/2015  . Seizures (HCC) 07/13/2015  . Elevated troponin 07/13/2015  . MS (multiple sclerosis) (HCC) 07/13/2015  . ESRD on dialysis (HCC) 07/13/2015  . Acute on chronic diastolic heart failure (HCC) 07/13/2015  . COPD (chronic obstructive pulmonary disease) (HCC)   . Acute encephalopathy 07/12/2015  . Pericardial effusion 05/05/2015  . ESRD (end stage renal disease) (HCC) 11/24/2013  . Chronic kidney disease, stage 3 08/01/2013  . Pre-operative cardiovascular examination 06/11/2013  . Altered mental status 02/26/2013  . Acute on chronic renal failure (HCC) 02/24/2013  . Hypertensive crisis 02/19/2013  . Chronic renal failure 02/19/2013  . Acute respiratory failure (HCC) 02/19/2013  . Physical deconditioning   . OSA (obstructive sleep apnea)   . Hypertensive emergency   . Sleep apnea   . Pulmonary hypertension (HCC)   . Chronic diastolic CHF (congestive heart failure) (HCC)   . Diabetes mellitus with renal complications [facility])     Expected Discharge Date: Expected Discharge Date: 08/10/15  Team Members Present: Physician leading conference: Dr. Maryla Morrow Social Worker Present: Amada Jupiter, LCSW Nurse Present: Chana Bode, RN PT Present: Teodoro Kil, PT;Rebecca Varner, Nita Sickle, PT OT Present: Ardis Rowan, Daneil Dolin, OT SLP Present: Feliberto Gottron, SLP PPS Coordinator present : Tora Duck, RN, CRRN     Current Status/Progress Goal Weekly Team Focus  Medical   hx of MS with visual changes at baseline with b/l occipital and frontal lobe infacts  Husband educations and optimization of meds  See above   Bowel/Bladder   oliguric HD M-W-F LBM continent loose bms   managed with HD Continent bowel  up to bathroom for all toileting needs   Swallow/Nutrition/ Hydration   Dys. 2 textures with thin liquids, Max A   Mod A  tolerance of current diet    ADL's   mod A UB self care; tot A+2 LB self care; total A + 2 for toileting  mod A overall  activity tolerance, functional transfers, attention to task, family education, task initiation   Mobility   max to total A x  bed mobility, transfers, sit <> stand  mod A overall   initiation, sustained attention, functional mobility training, sitting/standing balance, activity tolerance, pt/family education   Communication             Safety/Cognition/ Behavioral Observations  Max-Total A  Mod A  initiation, attention, orientation   Pain   n/a         Skin   buttocks skin thin red   no new breakdown  monitor skin and educate husband on skin care     Rehab Goals Patient on target to meet rehab goals:  Yes *See Care Plan and progress notes for long and short-term goals.  Barriers to Discharge: Safety, mobility, gait, abnormal labs values, HTN, seizure meds, DVT prophylaxis    Possible Resolutions to Barriers:  Cont education with family and pt regarding medications, labs assessment and adjustments    Discharge Planning/Teaching Needs:  Home with husband, daughter and sister providing 24/7 assistance.  To be scheduled.   Team Discussion:  Husband still very  controlling on medical management.  Need to focus on family education.  Some HD refusals.  May be slightly more alert but still with poor initiation and follow through.  No significant changes this week and no functional progress.  Goals for mod assist overall.  Revisions to Treatment Plan:  None   Continued Need for Acute Rehabilitation Level of Care: The patient requires daily medical management by a physician with specialized training in physical medicine and rehabilitation for the following conditions: Daily direction of a multidisciplinary physical rehabilitation program to ensure safe treatment while eliciting the highest outcome that is of practical value to the patient.: Yes Daily medical management of patient stability for increased activity during participation in an intensive rehabilitation regime.: Yes Daily analysis of laboratory values and/or radiology reports with any subsequent need for medication adjustment of medical intervention for : Cardiac problems;Neurological problems;Other  Sheba Whaling 08/05/2015, 11:35 AM

## 2015-08-06 ENCOUNTER — Inpatient Hospital Stay (HOSPITAL_COMMUNITY): Payer: Medicare Other | Admitting: Speech Pathology

## 2015-08-06 ENCOUNTER — Inpatient Hospital Stay (HOSPITAL_COMMUNITY): Payer: Medicare Other | Admitting: Physical Therapy

## 2015-08-06 ENCOUNTER — Inpatient Hospital Stay (HOSPITAL_COMMUNITY): Payer: BLUE CROSS/BLUE SHIELD

## 2015-08-06 DIAGNOSIS — I6340 Cerebral infarction due to embolism of unspecified cerebral artery: Secondary | ICD-10-CM

## 2015-08-06 DIAGNOSIS — J42 Unspecified chronic bronchitis: Secondary | ICD-10-CM

## 2015-08-06 LAB — RENAL FUNCTION PANEL
ALBUMIN: 2.6 g/dL — AB (ref 3.5–5.0)
Anion gap: 18 — ABNORMAL HIGH (ref 5–15)
BUN: 53 mg/dL — AB (ref 6–20)
CO2: 23 mmol/L (ref 22–32)
CREATININE: 5.54 mg/dL — AB (ref 0.44–1.00)
Calcium: 8.9 mg/dL (ref 8.9–10.3)
Chloride: 90 mmol/L — ABNORMAL LOW (ref 101–111)
GFR calc Af Amer: 9 mL/min — ABNORMAL LOW (ref >60)
GFR, EST NON AFRICAN AMERICAN: 8 mL/min — AB (ref >60)
GLUCOSE: 114 mg/dL — AB (ref 65–99)
PHOSPHORUS: 6.7 mg/dL — AB (ref 2.5–4.6)
Potassium: 4.8 mmol/L (ref 3.5–5.1)
Sodium: 131 mmol/L — ABNORMAL LOW (ref 135–145)

## 2015-08-06 LAB — CBC WITH DIFFERENTIAL/PLATELET
BASOS ABS: 0 10*3/uL (ref 0.0–0.1)
BASOS PCT: 0 %
EOS ABS: 0.1 10*3/uL (ref 0.0–0.7)
EOS PCT: 1 %
HCT: 31.5 % — ABNORMAL LOW (ref 36.0–46.0)
Hemoglobin: 9.8 g/dL — ABNORMAL LOW (ref 12.0–15.0)
Lymphocytes Relative: 14 %
Lymphs Abs: 1.6 10*3/uL (ref 0.7–4.0)
MCH: 27.8 pg (ref 26.0–34.0)
MCHC: 31.1 g/dL (ref 30.0–36.0)
MCV: 89.2 fL (ref 78.0–100.0)
MONO ABS: 1.5 10*3/uL — AB (ref 0.1–1.0)
Monocytes Relative: 13 %
NEUTROS ABS: 8.3 10*3/uL — AB (ref 1.7–7.7)
Neutrophils Relative %: 72 %
PLATELETS: 158 10*3/uL (ref 150–400)
RBC: 3.53 MIL/uL — ABNORMAL LOW (ref 3.87–5.11)
RDW: 16 % — AB (ref 11.5–15.5)
WBC: 11.6 10*3/uL — ABNORMAL HIGH (ref 4.0–10.5)

## 2015-08-06 LAB — GLUCOSE, CAPILLARY
GLUCOSE-CAPILLARY: 70 mg/dL (ref 65–99)
GLUCOSE-CAPILLARY: 134 mg/dL — AB (ref 65–99)
GLUCOSE-CAPILLARY: 90 mg/dL (ref 65–99)

## 2015-08-06 MED ORDER — SORBITOL 70 % SOLN: 30.0000 mL | Freq: Two times a day (BID) | Status: DC | PRN

## 2015-08-06 MED ORDER — SEVELAMER CARBONATE 800 MG PO TABS: 800.0000 mg | ORAL_TABLET | Freq: Three times a day (TID) | ORAL | Status: DC

## 2015-08-06 MED ORDER — CALCITRIOL 0.25 MCG PO CAPS: 0.2500 ug | ORAL_CAPSULE | ORAL | Status: AC

## 2015-08-06 MED ORDER — CALCITRIOL 0.25 MCG PO CAPS
ORAL_CAPSULE | ORAL | Status: AC
  Filled 2015-08-06: qty 1

## 2015-08-06 MED ORDER — DIVALPROEX SODIUM 250 MG PO DR TAB: 750.0000 mg | DELAYED_RELEASE_TABLET | Freq: Two times a day (BID) | ORAL | Status: DC

## 2015-08-06 MED ORDER — PANTOPRAZOLE SODIUM 40 MG PO TBEC: 40.0000 mg | DELAYED_RELEASE_TABLET | Freq: Every day | ORAL | Status: AC

## 2015-08-06 MED ORDER — SENNOSIDES-DOCUSATE SODIUM 8.6-50 MG PO TABS
2.0000 | ORAL_TABLET | Freq: Once | ORAL | Status: AC
  Administered 2015-08-06: 2 via ORAL
  Filled 2015-08-06: qty 2

## 2015-08-06 MED ORDER — PRAVASTATIN SODIUM 40 MG PO TABS: 40.0000 mg | ORAL_TABLET | Freq: Every day | ORAL | Status: DC

## 2015-08-06 NOTE — Plan of Care (Signed)
Problem: RH Swallowing Goal: LTG Patient will participate in dysphagia therapy (SLP) LTG: Patient will participate in dysphagia therapy with assist to increase swallow function as evidenced by bedside or objective clinical assessment (SLP)  Outcome: Not Met (add Reason) Needs Mod assist

## 2015-08-06 NOTE — Progress Notes (Signed)
Occupational Therapy Discharge Summary  Patient Details  Name: Gail Chapman MRN: [id number] Date of Birth: May 31, 1956   Patient has met 36 of 14 long term goals due to improved activity tolerance, postural control, ability to compensate for deficits, improved attention and improved awareness.  Pt made slow progress with BADLs during this admission and LTGs were downgraded.  Pt continued to require min verbal cues for task initiation, sequencing, attention to left, and attention to task. Pt required more than a reasonable amount of time to initiate tasks on request and to complete tasks with min verbal cues to redirect.  Pt requires mod/max A for BADLs and max A for toilet transfers. Pt's husband has been present for therapy sessions and has participated. Patient to discharge at [facility] Max Assist level.  Patient's care partner is independent to provide the necessary physical and cognitive assistance at discharge.    Recommendation:  Patient will benefit from ongoing skilled OT services in home health setting to continue to advance functional skills in the area of BADL and Reduce care partner burden.  Equipment: No equipment provided[facility]  Reasons for discharge: treatment goals met  Patient/family agrees with progress made and goals achieved: Yes  OT Discharge   Vision/Perception  Vision- History Baseline Vision/History: Wears glasses Wears Glasses: At all times Patient Visual Report: Blurring of vision Vision- Assessment Vision Assessment?: Yes Eye Alignment: Within Functional Limits Ocular Range of Motion: Restricted on the left Tracking/Visual Pursuits: Right eye does not track laterally;Left eye does not track laterally Saccades: Additional eye shifts occurred during testing;Additional head turns occurred during testing Convergence: Impaired (comment) Visual Fields: Left visual field deficit  Cognition Arousal/Alertness: Awake/alert Orientation Level: Oriented  X4 Attention: Sustained;Focused Focused Attention: Appears intact Sustained Attention: Impaired Memory: Impaired Memory Impairment: Storage deficit;Retrieval deficit;Decreased recall of new information Decreased Short Term Memory: Verbal basic Awareness: Impaired Awareness Impairment: Intellectual impairment;Emergent impairment Problem Solving: Impaired Safety/Judgment: Impaired Sensation Sensation Light Touch: Appears Intact Stereognosis: Not tested Hot/Cold: Appears Intact Proprioception: Appears Intact    Balance Static Sitting Balance Static Sitting - Balance Support: Bilateral upper extremity supported;Feet supported Static Sitting - Level of Assistance: 5: Stand by assistance Dynamic Sitting Balance Dynamic Sitting - Level of Assistance: 4: Min assist Extremity/Trunk Assessment RUE Assessment RUE Assessment: Exceptions to [facility] RUE AROM (degrees) Right Shoulder Flexion: 150 Degrees RUE Strength RUE Overall Strength Comments: 3+/5  LUE Assessment LUE Assessment: Exceptions to WFL LUE AROM (degrees) Left Shoulder Flexion: 110 Degrees LUE Strength Left Shoulder Flexion: 3+/5 Left Elbow Flexion: 3+/5 Left Hand Gross Grasp: Impaired   See Function Navigator for Current Functional Status.  Leotis Shames [facility] 08/06/2015, 11:01 AM

## 2015-08-06 NOTE — Progress Notes (Signed)
Occupational Therapy Note  Patient Details  Name: Gail Chapman MRN: [id number] Date of Birth: July 18, 1956  Today's Date: 08/06/2015 OT Individual Time: 0800-0830 OT Individual Time Calculation (min): 30 min   Pt denied pain Individual Therapy  Pt sitting in w/c upon arrival with husband present.  Pt's husband had performed transfer to w/c (cleared by PT) and assisted pt with breakfast.  Husband stated that he had to encourage pt to finish breakfast.  Focus on grooming tasks at sink.  Pt continues to require mod verbal cues for task initiation and sequencing.  Pt also requires more than a reasonable amount of time to complete tasks.  Discussed with patient that she would have another OT session later in the morning to complete bathing and dressing tasks.   Lavone Neri [facility] 08/06/2015, 9:50 AM

## 2015-08-06 NOTE — Discharge Instructions (Signed)
Inpatient Rehab Discharge Instructions  Zoey Bidwell Carbo Discharge date and time: 08/07/15   Activities/Precautions/ Functional Status: Activity: activity as tolerated Diet:  Chopped foods.  Renal and diabetic restrictions.  Wound Care: none needed   Functional status:  ___ No restrictions     ___ Walk up steps independently _X__ 24/7 supervision/assistance   ___ Walk up steps with assistance ___ Intermittent supervision/assistance  ___ Bathe/dress independently ___ Walk with walker     _X__ Bathe/dress with assistance ___ Walk Independently    ___ Shower independently _X__ Walk with assistance    ___ Shower with assistance _X__ No alcohol     ___ Return to work/school ________    COMMUNITY REFERRALS UPON DISCHARGE:    Home Health:   PT      ST       SW                    Agency:  [facility] Phone: [phone]  (you expect the start of services no later that 08/11/15)  Medical Equipment/Items Ordered:  Wheelchair and cushion                                                       Agency/Supplier:  [facility] @ [phone]    Special Instructions: 1. Continue to use Insulin regimen as at home.  2. Avoid swimming, diving, high altitudes, strobe lights and tub baths due to safety concerns/seizures   STROKE/TIA DISCHARGE INSTRUCTIONS SMOKING Cigarette smoking nearly doubles your risk of having a stroke & is the single most alterable risk factor  If you smoke or have smoked in the last 12 months, you are advised to quit smoking for your health.  Most of the excess cardiovascular risk related to smoking disappears within a year of stopping.  Ask you doctor about anti-smoking medications  [place] Quit Line: 1-800-QUIT NOW  Free Smoking Cessation Classes [phone]  CHOLESTEROL Know your levels; limit fat & cholesterol in your diet  Lipid Panel     Component Value Date/Time   CHOL 171 07/13/2015 0344   TRIG 54 07/13/2015 0344   HDL 67 07/13/2015  0344   CHOLHDL 2.6 07/13/2015 0344   VLDL 11 07/13/2015 0344   LDLCALC 93 07/13/2015 0344      Many patients benefit from treatment even if their cholesterol is at goal.  Goal: Total Cholesterol (CHOL) less than 160  Goal:  Triglycerides (TRIG) less than 150  Goal:  HDL greater than 40  Goal:  LDL (LDLCALC) less than 100   BLOOD PRESSURE American Stroke Association blood pressure target is less that 120/80 mm/Hg  Your discharge blood pressure is:  BP: (!) 146/67 mmHg  Monitor your blood pressure  Limit your salt and alcohol intake  Many individuals will require more than one medication for high blood pressure  DIABETES (A1c is a blood sugar average for last 3 months) Goal HGBA1c is under 7% (HBGA1c is blood sugar average for last 3 months)  Diabetes:     Lab Results  Component Value Date   HGBA1C 5.9* 07/13/2015     Your HGBA1c can be lowered with medications, healthy diet, and exercise.  Check your blood sugar as directed by your physician  Call your physician if you experience unexplained or low blood sugars.  PHYSICAL ACTIVITY/REHABILITATION Goal is 30 minutes at least 4 days per week  Activity: Increase activity slowly, Therapies:  See above Return to work:  N/A  Activity decreases your risk of heart attack and stroke and makes your heart stronger.  It helps control your weight and blood pressure; helps you relax and can improve your mood.  Participate in a regular exercise program.  Talk with your doctor about the best form of exercise for you (dancing, walking, swimming, cycling).  DIET/WEIGHT Goal is to maintain a healthy weight  Your discharge diet is: DIET DYS 2 Room service appropriate?: Yes; Fluid consistency:: Thin; Fluid restriction:: 1200 mL Fluid liquids Your height is:  Height: 5' 1.5" (156.2 cm) Your current weight is: Weight: 65.7 kg (144 lb 13.5 oz) Your Body Mass Index (BMI) is:  BMI (Calculated): 27.2  Following the type of diet specifically  designed for you will help prevent another stroke.  Your goal weight is:  134 lbs  Your goal Body Mass Index (BMI) is 19-24.  Healthy food habits can help reduce 3 risk factors for stroke:  High cholesterol, hypertension, and excess weight.  RESOURCES Stroke/Support Group:  Call [phone]   STROKE EDUCATION PROVIDED/REVIEWED AND GIVEN TO PATIENT Stroke warning signs and symptoms How to activate emergency medical system (call 911). Medications prescribed at discharge. Need for follow-up after discharge. Personal risk factors for stroke. Pneumonia vaccine given:  Flu vaccine given:  My questions have been answered, the writing is legible, and I understand these instructions.  I will adhere to these goals & educational materials that have been provided to me after my discharge from the hospital.      My questions have been answered and I understand these instructions. I will adhere to these goals and the provided educational materials after my discharge from the hospital.  Patient/Caregiver Signature _______________________________ Date __________  Clinician Signature _______________________________________ Date __________  Please bring this form and your medication list with you to all your follow-up doctor's appointments.

## 2015-08-06 NOTE — Progress Notes (Signed)
S: No new CO.  Ready to go home O:BP 146/67 mmHg  Pulse 79  Temp(Src) 97.8 F (36.6 C) (Oral)  Resp 14  Ht 5' 1.5" (1.562 m)  Wt 65.7 kg (144 lb 13.5 oz)  BMI 26.93 kg/m2  SpO2 100%  Intake/Output Summary (Last 24 hours) at 08/06/15 1016 Last data filed at 08/06/15 0900  Gross per 24 hour  Intake    480 ml  Output      0 ml  Net    480 ml   Weight change: 1.8 kg (3 lb 15.5 oz) WUJ:WJXBJ and alert CVS:RRR 2/6 systolic M Resp:Clear Abd:+ BS NTND Ext:no edema NEURO: No asterixis  Lt IJ PC   . antiseptic oral rinse  7 mL Mouth Rinse BID  . aspirin EC  325 mg Oral Daily  . calcitRIOL  0.25 mcg Oral Q M,W,F-HD  . carvedilol  12.5 mg Oral BID WC  . darbepoetin (ARANESP) injection - DIALYSIS  60 mcg Intravenous Q Wed-HD  . divalproex  750 mg Oral Q12H  . doxazosin  2 mg Oral QHS  . feeding supplement (NEPRO CARB STEADY)  237 mL Oral BID BM  . insulin aspart  0-9 Units Subcutaneous TID WC  . multivitamin  1 tablet Oral QHS  . pantoprazole  40 mg Oral QHS  . pravastatin  40 mg Oral q1800  . sevelamer carbonate  800 mg Oral TID WC   No results found. BMET    Component Value Date/Time   NA 136 08/03/2015 1615   K 4.7 08/03/2015 1615   CL 97* 08/03/2015 1615   CO2 23 08/03/2015 1615   GLUCOSE 114* 08/03/2015 1615   BUN 74* 08/03/2015 1615   CREATININE 6.93* 08/03/2015 1615   CALCIUM 8.6* 08/03/2015 1615   GFRNONAA 6* 08/03/2015 1615   GFRAA 7* 08/03/2015 1615   CBC    Component Value Date/Time   WBC 18.0* 08/03/2015 1615   RBC 3.61* 08/03/2015 1615   HGB 10.3* 08/03/2015 1615   HCT 32.4* 08/03/2015 1615   PLT 110* 08/03/2015 1615   MCV 89.8 08/03/2015 1615   MCH 28.5 08/03/2015 1615   MCHC 31.8 08/03/2015 1615   RDW 15.9* 08/03/2015 1615   LYMPHSABS 2.0 07/31/2015 0511   MONOABS 1.3* 07/31/2015 0511   EOSABS 0.3 07/31/2015 0511   BASOSABS 0.0 07/31/2015 0511     Assessment: 1. ESRD  MWF [place] 2. HTN 3. 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 HD today.  Note plans for DC tomorrow.  Gail Chapman T

## 2015-08-06 NOTE — Progress Notes (Signed)
[facility] Progress Note  Subjective/Complaints: Pt without complaints. States she slept well. Remembered playing "uno" yesterday. Apparently family ed went well yesterday.     ROS: limited due to cognition.  However appears to deny CP, SOB, n/v/d.   Objective: Vital Signs: Blood pressure 151/57, pulse 73, temperature 97.8 F (36.6 C), temperature source Oral, resp. rate 14, height 5' 1.5" (1.562 m), weight 65.7 kg (144 lb 13.5 oz), SpO2 100 %. No results found. Results for orders placed or performed during the hospital encounter of 07/26/15 (from the past 72 hour(s))  Glucose, capillary     Status: None   Collection Time: 08/03/15 11:44 AM  Result Value Ref Range   Glucose-Capillary 73 65 - 99 mg/dL   Comment 1 Notify RN   Renal function panel     Status: Abnormal   Collection Time: 08/03/15  4:15 PM  Result Value Ref Range   Sodium 136 135 - 145 mmol/L   Potassium 4.7 3.5 - 5.1 mmol/L   Chloride 97 (L) 101 - 111 mmol/L   CO2 23 22 - 32 mmol/L   Glucose, Bld 114 (H) 65 - 99 mg/dL   BUN 74 (H) 6 - 20 mg/dL   Creatinine, Ser 6.93 (H) 0.44 - 1.00 mg/dL   Calcium 8.6 (L) 8.9 - 10.3 mg/dL   Phosphorus 9.3 (H) 2.5 - 4.6 mg/dL   Albumin 2.5 (L) 3.5 - 5.0 g/dL   GFR calc non Af Amer 6 (L) >60 mL/min   GFR calc Af Amer 7 (L) >60 mL/min    Comment: (NOTE) The eGFR has been calculated using the CKD EPI equation. This calculation has not been validated in all clinical situations. eGFR's persistently <60 mL/min signify possible Chronic Kidney Disease.    Anion gap 16 (H) 5 - 15  CBC     Status: Abnormal   Collection Time: 08/03/15  4:15 PM  Result Value Ref Range   WBC 18.0 (H) 4.0 - 10.5 K/uL   RBC 3.61 (L) 3.87 - 5.11 MIL/uL   Hemoglobin 10.3 (L) 12.0 - 15.0 g/dL   HCT 32.4 (L) 36.0 - 46.0 %   MCV 89.8 78.0 - 100.0 fL   MCH 28.5 26.0 - 34.0 pg   MCHC 31.8 30.0 - 36.0 g/dL   RDW 15.9 (H) 11.5 - 15.5 %   Platelets 110 (L) 150 - 400 K/uL   Comment: REPEATED TO VERIFY CONSISTENT WITH PREVIOUS RESULT   Glucose, capillary     Status: None   Collection Time: 08/03/15  8:09 PM  Result Value Ref Range   Glucose-Capillary 80 65 - 99 mg/dL  Glucose, capillary     Status: Abnormal   Collection Time: 08/04/15  6:49 AM  Result Value Ref Range   Glucose-Capillary 62 (L) 65 - 99 mg/dL  Glucose, capillary     Status: Abnormal   Collection Time: 08/04/15  7:15 AM  Result Value Ref Range   Glucose-Capillary 103 (H) 65 - 99 mg/dL  Glucose, capillary     Status: Abnormal   Collection Time: 08/04/15 11:25 AM  Result Value Ref Range   Glucose-Capillary 107 (H) 65 - 99 mg/dL  Glucose, capillary     Status: None   Collection Time: 08/04/15  7:24 PM  Result Value Ref Range   Glucose-Capillary 87 65 - 99 mg/dL  Glucose, capillary     Status: None   Collection Time: 08/05/15  6:49 AM  Result Value Ref Range   Glucose-Capillary 85  65 - 99 mg/dL  Glucose, capillary     Status: None   Collection Time: 08/05/15 11:47 AM  Result Value Ref Range   Glucose-Capillary 83 65 - 99 mg/dL   Comment 1 Notify RN   Glucose, capillary     Status: Abnormal   Collection Time: 08/05/15  4:38 PM  Result Value Ref Range   Glucose-Capillary 114 (H) 65 - 99 mg/dL   Comment 1 Notify RN   Glucose, capillary     Status: Abnormal   Collection Time: 08/05/15  9:22 PM  Result Value Ref Range   Glucose-Capillary 127 (H) 65 - 99 mg/dL  Glucose, capillary     Status: Abnormal   Collection Time: 08/05/15 10:28 PM  Result Value Ref Range   Glucose-Capillary 145 (H) 65 - 99 mg/dL  Glucose, capillary     Status: None   Collection Time: 08/06/15  6:46 AM  Result Value Ref Range   Glucose-Capillary 70 65 - 99 mg/dL     Gen: NAD.  VS reviewed.  HEENT: Normocephalic, atraumatic Eyes: Conj WNL, EOMI Cardio: RRR and murmur Resp: CTA B/L and unlabored GI: BS positive and NT, ND Skin:   Intact. Warm and Dry. MSK:   Likely 4/5 grossly throughout, however, poor  effort.   Neurological: She is alert and oriented x3.   Patient disoriented. Lacks insight and awareness of deficits. Able to follow simple motor commands without difficulty.  Skin: Skin is warm and dry. No rash noted. She is not diaphoretic. No erythema.  Psychiatric: Her affect is blunt. Her speech is delayed. She is slowed. Cognition and memory are impaired.    Assessment/Plan: 1. 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 H/o MS with chronic paraplegia.    which require 3+ hours per day of interdisciplinary therapy in a comprehensive inpatient rehab setting. Physiatrist is providing close team supervision and 24 hour management of active medical problems listed below. Physiatrist and rehab team continue to assess barriers to discharge/monitor patient progress toward functional and medical goals. FIM: Function - Bathing Bathing activity did not occur: Refused Position: Bed Body parts bathed by patient: Right arm, Left arm, Chest, Abdomen, Front perineal area Body parts bathed by helper: Buttocks, Right upper leg, Left upper leg, Right lower leg, Left lower leg, Back Assist Level: 2 helpers  Function- Upper Body Dressing/Undressing What is the patient wearing?: Pull over shirt/dress Pull over shirt/dress - Perfomed by patient: Thread/unthread right sleeve, Thread/unthread left sleeve, Put head through opening Pull over shirt/dress - Perfomed by helper: Pull shirt over trunk Assist Level: Touching or steadying assistance(Pt > 75%) Function - Lower Body Dressing/Undressing What is the patient wearing?: Underwear, Pants, Non-skid slipper socks Position: Bed Underwear - Performed by helper: Thread/unthread right underwear leg, Thread/unthread left underwear leg, Pull underwear up/down Pants- Performed by helper: Thread/unthread right pants leg, Thread/unthread left pants leg, Pull pants up/down, Fasten/unfasten pants Non-skid slipper socks-  Performed by helper: Don/doff right sock, Don/doff left sock Assist for footwear: Dependant Assist for lower body dressing: 2 Helpers  Function - Toileting Toileting activity did not occur: N/A Toileting steps completed by helper: Adjust clothing prior to toileting, Performs perineal hygiene, Adjust clothing after toileting Toileting Assistive Devices: Grab bar or rail Assist level: Two helpers  Function - Air cabin crew transfer activity did not occur: N/A Toilet transfer assistive device: Radio broadcast assistant lift: Stedy Assist level to toilet: Maximal assist (Pt 25 - 49%/lift and lower) Assist level from toilet:  Maximal assist (Pt 25 - 49%/lift and lower)  Function - Chair/bed transfer Chair/bed transfer method: Stand pivot Chair/bed transfer assist level: Maximal assist (Pt 25 - 49%/lift and lower) Chair/bed transfer assistive device: Armrests Chair/bed transfer details: Verbal cues for technique, Manual facilitation for weight shifting, Manual facilitation for placement  Function - Locomotion: Wheelchair Will patient use wheelchair at discharge?: Yes Type: Manual Max wheelchair distance: 10 Assist Level: Supervision or verbal cues Wheel 50 feet with 2 turns activity did not occur: Safety/medical concerns Assist Level: Total assistance (Pt < 25%) Wheel 150 feet activity did not occur: Safety/medical concerns Assist Level: Total assistance (Pt < 25%) Turns around,maneuvers to table,bed, and toilet,negotiates 3% grade,maneuvers on rugs and over doorsills: No Function - Locomotion: Ambulation Ambulation activity did not occur: Safety/medical concerns (lethargic, unable to maintain static standing using RW > 5 sec with max A x 2 before sitting) Assistive device: Walker-rolling Max distance: 10 Assist level: Maximal assist (Pt 25 - 49%) Walk 10 feet activity did not occur: Safety/medical concerns Assist level: Maximal assist (Pt 25 - 49%) Walk 50 feet with 2 turns  activity did not occur: Safety/medical concerns Walk 150 feet activity did not occur: Safety/medical concerns Walk 10 feet on uneven surfaces activity did not occur: Safety/medical concerns  Function - Comprehension Comprehension: Auditory Comprehension assist level: Understands basic 50 - 74% of the time/ requires cueing 25 - 49% of the time  Function - Expression Expression: Verbal Expression assist level: Expresses basic 25 - 49% of the time/requires cueing 50 - 75% of the time. Uses single words/gestures.  Function - Social Interaction Social Interaction assist level: Interacts appropriately 75 - 89% of the time - Needs redirection for appropriate language or to initiate interaction.  Function - Problem Solving Problem solving assist level: Solves basic 25 - 49% of the time - needs direction more than half the time to initiate, plan or complete simple activities  Function - Memory Memory assist level: Recognizes or recalls 25 - 49% of the time/requires cueing 50 - 75% of the time Patient normally able to recall (first 3 days only): That he or she is in a hospital  Medical Problem List and Plan: 1. 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 H/o MS.   -will work toward Saturday discharge. Husband appeared competent with transfers   2.  DVT Prophylaxis/Anticoagulation: ECASA, SCD's, TEDS  -off heparin 3. Pain Management: Tylenol prn   4. Mood: LCSW to follow for evaluation and support.   5. Neuropsych: This patient is not capable of making decisions on her own behalf. 6. Skin/Wound Care: Routine pressure relief measures 7. Fluids/Electrolytes/Nutrition: Monitor I/O. 1200 cc FR.  Labs with HD.   8. 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    9. HTN: Need to prevent hypotension to prevent recurrent watershed infarcts.  continue to educate husband on need for bp control  --sbp's acceptable in the 140's to 150's at  this time 10. ESRD: Has history of orhtostatic changes.   11. OSA: Continue CPAP at bedtime/sleeping.   12. E coli Sepsis: continue Rocephin.   13. Arrthymias:  Husband refusing Coreg--he holds her BP meds at home if SBP < 180. He has refused multiple doses of coreg. Relayed reported episodes of  asymptomatic VT and need to continue coreg. Will set hold parameter on Cardura.   14. DM type 2: Husband does not want BS covered if under 120.  Will monitor BS ac/hs. BS  well controlled off lantus insulin---tight control---watch am hypoglycemia. 15. COPD: Uses oxygen prn with activity.  16. Anemia: Hb 10.3 on 10/25. Labs with HD today 17. Leukocytosis: Ucx with multispecies---will recollect sample today  -no further diarrhea to suggest c diff  -stopped ceftriaxone as she's been afebrile and has been on since at least 10/17 18. Thrombocytopenia: Now trending up after d/cing heparin. Recheck today in HD    19. Seizures: continue depakote at 732m bid dose---education important    LOS (Days) 11 A FACE TO FACE EVALUATION WAS PERFORMED  SWARTZ,ZACHARY T 08/06/2015, 8:58 AM

## 2015-08-06 NOTE — Discharge Summary (Signed)
Physician Discharge Summary  Patient ID: Gail Chapman MRN: [id number] DOB/AGE: 11/17/1955 59 y.o.  Admit date: 07/26/2015 Discharge date: 08/06/2015  Discharge Diagnoses:  Principal Problem:   Cerebral embolism with cerebral infarction Active Problems:   OSA (obstructive sleep apnea)   Diabetes mellitus with renal complications (HCC)   COPD (chronic obstructive pulmonary disease) (HCC)   Seizures (HCC)   MS (multiple sclerosis) (HCC)   ESRD on dialysis (HCC)   Gait disturbance, post-stroke   CVA (cerebral infarction)   Discharged Condition: stable  Significant Diagnostic Studies: Ct Head Wo Contrast  07/14/2015  CLINICAL DATA:  Acute onset confusion and lethargy after dialysis. Chronic renal failure. EXAM: CT HEAD WITHOUT CONTRAST TECHNIQUE: Contiguous axial images were obtained from the base of the skull through the vertex without intravenous contrast. COMPARISON:  None. FINDINGS: Moderate diffuse atrophy is present. There is no intracranial mass, hemorrhage, extra-axial fluid collection, midline shift. There is small vessel disease throughout the centra semiovale bilaterally. There are age uncertain fairly small infarcts in the anterior left occipital and anterior superior left frontal lobes. There is a small age uncertain infarct in the superior posterior right cerebellum. There is also a small age uncertain infarct in the posterior right temporal lobe lateral and slightly posterior to the temporal horn the right lateral ventricle. There is a small age uncertain infarct in the right lentiform nucleus. Bony calvarium appears intact. The mastoid air cells are clear. There is mild mucosal thickening of several ethmoid air cells bilaterally. IMPRESSION: Atrophy with extensive periventricular small vessel disease. Age uncertain small infarcts at several sites as described. No hemorrhage or mass effect. Mild ethmoid sinus disease bilaterally. Electronically Signed   By: Bretta Bang III  M.D.   On: 07/14/2015 14:25   Mr Brain Wo Contrast  07/15/2015  CLINICAL DATA:  Stroke with cerebral ischemia EXAM: MRI HEAD WITHOUT CONTRAST TECHNIQUE: Multiplanar, multiecho pulse sequences of the brain and surrounding structures were obtained without intravenous contrast. COMPARISON:  CT head 07/14/2015 FINDINGS: Multiple areas of restricted diffusion bilaterally compatible with acute infarction. Acute infarct in the occipital lobes bilaterally left greater than right. The left-sided stroke measures up to 11 mm. Small area of acute infarct in the right frontal operculum and in the left frontal white matter. Moderate to advanced chronic microvascular ischemic changes in the white matter bilaterally. Chronic infarcts in the thalamus bilaterally. Chronic blood products in the left temporal lobe and in the pontomedullary junction compatible with prior hemorrhage. This is likely related to poorly controlled hypertension. Moderate atrophy. Ventricular enlargement consistent with atrophy. No hydrocephalus. Negative for mass lesion. Normal orbit.  Paranasal sinuses clear. Image quality degraded by motion IMPRESSION: Atrophy and moderate to advanced chronic ischemia. Chronic hemorrhage in the brainstem and left temporal lobe due to chronic hypertension Acute infarcts in the occipital lobe and frontal lobes bilaterally. Electronically Signed   By: Marlan Palau M.D.   On: 07/15/2015 14:45   Dg Chest Port 1 View  07/15/2015  CLINICAL DATA:  E86.9 (ICD-10-CM) - Volume depletion EXAM: PORTABLE CHEST 1 VIEW COMPARISON:  08/28/2014 FINDINGS: Left-sided dialysis catheter tip to the upper right atrium. Cardiopericardial silhouette is enlarged and stable. Lungs are clear. No pulmonary edema. IMPRESSION: No significant change in marked enlargement of the cardiopericardial silhouette. Electronically Signed   By: Norva Pavlov M.D.   On: 07/15/2015 10:20    Labs:  Basic Metabolic Panel:  Recent Labs Lab  08/03/15 1615 08/06/15 1759  NA 136 131*  K 4.7 4.8  CL 97* 90*  CO2 23 23  GLUCOSE 114* 114*  BUN 74* 53*  CREATININE 6.93* 5.54*  CALCIUM 8.6* 8.9  PHOS 9.3* 6.7*    CBC:  Recent Labs Lab 08/03/15 1615 08/06/15 1758  WBC 18.0* 11.6*  NEUTROABS  --  8.3*  HGB 10.3* 9.8*  HCT 32.4* 31.5*  MCV 89.8 89.2  PLT 110* 158    CBG:  Recent Labs Lab 08/05/15 2228 08/06/15 0646 08/06/15 1148 08/06/15 2143 08/07/15 0723  GLUCAP 145* 70 134* 90 87    Today's Vitals   08/06/15 2056 08/06/15 2110 08/06/15 2209 08/07/15 0828  BP: 107/63 114/67 132/69 152/76  Pulse: 76 75 78 72  Temp:  96.9 F (36.1 C) 97.6 F (36.4 C)   TempSrc:  Oral Oral   Resp:  18 18   Height:      Weight:  62.8 kg (138 lb 7.2 oz)    SpO2:  100% 100%   PainSc:  0-No pain      Brief HPI:   Gail Chapman is a 59 y.o. RH-female with history of HTN, DM, diastolic CHF, COPD-oxygen dependent, MS with BLE weakness, ESRD-HD MWF who was admitted on 07/12/15 with generalized seizure and transient confusion. CT head negative and EEG done revealing focal sharp wave Left frontal region suggestive of focal disturbance with epileptogenic potential. She was placed on Depakote for seizures per neurology input. 2D echo with EF 50-55% with akinesis of basal-midinferior myocardium, grade 3 diastolic dysfunction moderate MVR and small to moderate pericardial effusion--no tamponade. Cardiology consulted for input and felt that elevated cardiac enzymes likely due to CHF. Cardene drip was started due to labile blood pressures.  She had decrease in mental status with confusion after dialysis on 10/05 and MRI/MRA brain done revealing acute infarcts in bilateral occipital and frontal lobes and chronic hemorrhage in brain stem and left temporal lobes due to HTN. Dr. Roda Shutters recommended ASA for embolic stroke question due to hypotension v/s AFib.   She had persistent lethargy with repeat EEG showed improvement in frontal sharps  but continued parieto-oocipital sharp waves therefore Vimpat added for seizure control. Patient has had hypotensive episodes correlating to infarct and to have 30 day cardiac event monitoring recommended on outpatient basis. She had recurrent lethargy due to fever likely from E coli urosepsis and antibiotics were narrowed to rocephin.  Vimpat was discontinued as husband has been refusing this and patient to continue on Depakote on 750 mg bid. Mentation and activity tolerance is improving and therapy has been ongoing. Patient with resultant weakness as well as cognitive deficits affecting mobility. Family and PT requesting CIR for follow up therapy.    Hospital Course: Gail Chapman was admitted to rehab 07/26/2015 for inpatient therapies to consist of PT, ST and OT at least three hours five days a week. Past admission physiatrist, therapy team and rehab RN have worked together to provide customized collaborative inpatient rehab. Blood pressures were monitored on bid basis and coreg was ordered for use on bid basis to be used for SBP >180 as compromise with her husband.  Husband has refused multiple medications during her rehab stay.  Lethargy has resolved and she has been seizure free on current dose of depakote. Valproic acid levels were therapeutic at 78 on 07/26/15. She has had persistent leucocytosis and repeat UCS on 10/24 was negative.  Repeat CBC on 10/28 showed downward trend in WBC and rocephin was discontinued after 10 day course as patient has been afebrile.  Po intake has been variable and nephro supplements were offered to help maintain nutritional status.  Diabetes has been monitored on ac/hs basis and blood sugars have been relatively controlled on SSI.  Husband was advised to continue insulin regimen per home protocol at discharge.  She was maintained on SQ heparin initially for DVT prophylaxis but was started having downward trend in platelets to 79. 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 Serial CBC has been monitored with HD and platelets have recovered to 158.  Husband has been educated on medication compliance as well as need for ongoing therapy.  LOS was decreased per family request.  Repeat blood cultures were drawn on 10/28 during HD prior to discharge.  Patient has made slow limited progress during her rehab stay and continues to require max assist with physical as well as cognitive tasks. She will continue to receive follow up HHPT, HHST and HHSW by [facility] after discharge.    ADDENDUM:  On 08/07/15-- Blood cultures 2/2 were reported to be positive for coag negative staph. Sensitivities currently pending.  Nephrology felt that this is likely due to line infection and vancomycin was recommended with HD.  Husband was contacted with results and was advised to take patient to ED is she developed fevers, chills, etc.    Rehab course: During patient's stay in rehab weekly team conferences were held to monitor patient's progress, set goals and discuss barriers to discharge. At admission, patient required total assist with mobility and self care tasks.  Cognitive evaluation revealed significant deficits in attention, initiation, basic problem solving and intellectual awareness of deficits which were impacted by her lethargy. She has had improvement in activity tolerance, balance, postural control, as well as ability to compensate for deficits. She has shown improvement in ability to follow one step commands but continue to demonstrate significant fatigue.  She requires max assist with multimodal cues for bed mobility, car transfers as well as attention and awareness of tasks. She requires Max assist for basic and familiar cognitive-linguistic tasks and set-up with Mod assist for utilization of swallowing compensatory strategies to minimize overt s/s of aspiration with Dys.2 textures and thin liquids.  Family education was completed with husband regarding all aspects of care/assistance.     Disposition:  Home  Diet:  Chopped foods. Renal and diabetic restrictions.   Special Instructions: 1. Check blood sugars before meals and use home sliding scale. 2. Follow up with PMD for referral to cardiology and set up for 30 day cardiac event monitor.       Medication List    STOP taking these medications        ciprofloxacin 500 MG tablet  Commonly known as:  CIPRO     doxazosin 2 MG tablet  Commonly known as:  CARDURA     insulin aspart 100 UNIT/ML injection  Commonly known as:  novoLOG     levETIRAcetam 750 MG tablet  Commonly known as:  KEPPRA      TAKE these medications        aspirin 325 MG EC tablet  Take 1 tablet (325 mg total) by mouth daily.     calcitRIOL 0.25 MCG capsule  Commonly known as:  ROCALTROL  Take 1 capsule (0.25 mcg total) by mouth every Monday, Wednesday, and Friday with hemodialysis.     carvedilol 12.5 MG tablet  Commonly known as:  COREG  Take 1 tablet (12.5 mg total)  by mouth 2 (two) times daily with a meal. On takes on non dialysis day.     divalproex 250 MG DR tablet  Commonly known as:  DEPAKOTE  Take 3 tablets (750 mg total) by mouth every 12 (twelve) hours.     glucose blood test strip  Commonly known as:  FREESTYLE LITE  Use as instructed     insulin detemir 100 UNIT/ML injection  Commonly known as:  LEVEMIR  Inject 0.04 mLs (4 Units total) into the skin at bedtime. Hold if CBG<150     pantoprazole 40 MG tablet  Commonly known as:  PROTONIX  Take 1 tablet (40 mg total) by mouth at bedtime.     pravastatin 40 MG tablet  Commonly known as:  PRAVACHOL  Take 1 tablet (40 mg total) by mouth daily at 6 PM.     promethazine 25 MG suppository  Commonly known as:  PHENERGAN  Place 1 suppository (25 mg total) rectally at bedtime.     sevelamer carbonate 800 MG tablet  Commonly known as:  RENVELA  Take 1 tablet (800 mg total) by  mouth 3 (three) times daily with meals.        Follow-up Information    Follow up with Ranelle Oyster, MD On 09/14/2015.   Specialty:  Physical Medicine and Rehabilitation   Why:  Be there at 9:40 for 10 am appointment   Contact information:   [address], [address] [place] [address] [phone]       Follow up with Xu,Jindong, MD. Call today.   Specialty:  Neurology   Why:  for follow up appointment in 6 weeks.    Contact information:   [address] [place] [address] [phone]       Follow up with Jonetta Speak, MD. Call in 2 days and referral to local cardiologist for cardiac event monitor.    Why:  for post hospital follow up   Contact information:   [address] [place] [address] [phone]       Signed: Jacquelynn Cree 08/09/2015, 8:22 AM

## 2015-08-06 NOTE — Plan of Care (Signed)
Problem: RH Car Transfers Goal: LTG Patient will perform car transfers with assist (PT) LTG: Patient will perform car transfers with assistance (PT).  Outcome: Adequate for Discharge Patient requires max A for car transfers.

## 2015-08-06 NOTE — Plan of Care (Signed)
Problem: RH Bed Mobility Goal: LTG Patient will perform bed mobility with assist (PT) LTG: Patient will perform bed mobility with assistance, with/without cues (PT).  Outcome: Adequate for Discharge Patient requires max A for bed mobility which husband demonstrated appropriately and reports he was providing PTA.   Problem: RH Bed to Chair Transfers Goal: LTG Patient will perform bed/chair transfers w/assist (PT) LTG: Patient will perform bed/chair transfers with assistance, with/without cues (PT).  Outcome: Adequate for Discharge Patient requires max A for stand pivot transfers which husband demonstrated appropriately.   Problem: RH Attention Goal: LTG Patient will demonstrate focused/sustained (PT) LTG: Patient will demonstrate focused/sustained/selective/alternating/divided attention during functional mobility in specific environment with assist for # of minutes (PT)  Outcome: Not Met (add Reason) Patient requires max A for sustained attention.   Problem: RH Awareness Goal: LTG: Patient will demonstrate intellectual/emergent (PT) LTG: Patient will demonstrate intellectual/emergent/anticipatory awareness with assist during a mobility activity (PT)  Outcome: Not Met (add Reason) Patient requires max A for intellectual/emergent awareness.

## 2015-08-06 NOTE — Progress Notes (Signed)
Speech Language Pathology Daily Session Note  Patient Details  Name: Gail Chapman MRN: [id number] Date of Birth: 1956-03-14  Today's Date: 08/06/2015 SLP Individual Time: 0830-0900 SLP Individual Time Calculation (min): 30 min  Short Term Goals: Week 2: SLP Short Term Goal 1 (Week 2): Pt will consume her currenty prescribed diet with min verbal cues for use of swallowing precautions and minimal overt s/s of aspiration.  SLP Short Term Goal 2 (Week 2): Pt will initiate functional tasks with no more than 2 verbal cues in 50% of opportunities SLP Short Term Goal 3 (Week 2): Pt will complete basic problem solving tasks with 80% accuracy with Max cues SLP Short Term Goal 4 (Week 2): Pt will sustain attention to functional task for 2 minutes with Max cues SLP Short Term Goal 5 (Week 2): Pt will utilize external memory aids to facilitate recall of new and daily information with Max cues SLP Short Term Goal 6 (Week 2): Pt will verbalize at least 1 physical and 2 cognitive deficits with Max cues  Skilled Therapeutic Interventions: Skilled treatment session focused on addressing cognition goals. SLP facilitated session by providing Max assist multimodal cues to problem solve sorting coins; patient also benefited from 2-3 verbal cues to initiate steps of task.  Patient initiated steps with 2 verbal cues in 60% of opportunities.  SLP also facilitated session with Total assist demonstrative cues to count coins.  Continue with current plan of care.    Function:  Cognition Comprehension Comprehension assist level: Understands basic 50 - 74% of the time/ requires cueing 25 - 49% of the time  Expression   Expression assist level: Expresses basic 25 - 49% of the time/requires cueing 50 - 75% of the time. Uses single words/gestures.  Social Interaction Social Interaction assist level: Interacts appropriately 25 - 49% of time - Needs frequent redirection.  Problem Solving Problem solving assist level:  Solves basic 25 - 49% of the time - needs direction more than half the time to initiate, plan or complete simple activities  Memory Memory assist level: Recognizes or recalls 25 - 49% of the time/requires cueing 50 - 75% of the time    Pain Pain Assessment Pain Assessment: No/denies pain  Therapy/Group: Individual Therapy  Charlane Ferretti., CCC-SLP [phone]  Gail Chapman 08/06/2015, 11:18 AM

## 2015-08-06 NOTE — Progress Notes (Signed)
Speech Language Pathology Discharge Summary and Final Treatment Note  Patient Details  Name: Gail Chapman MRN: [id number] Date of Birth: 1955-10-31  Today's Date: 08/06/2015 SLP Individual Time: 0626-9485 SLP Individual Time Calculation (min): 30 min   Skilled Therapeutic Interventions:   Skilled treatment session focused on addressing dysphagia and cognition goals. SLP facilitated session by providing set-up assist and contextual cue of handing patient her utensil for initiation of self-feeding.  SLP also facilitated session with Min-Mod verbal cues for portion control, pacing and sustained attention to the oral phase of swallow.  Patient required increased time, but no overt s/s of aspiration of Dys.2 textures and thin liquids via straw. Session ended with patient returning demonstration of use of cal bell and remote with Min-Mod assist.  Education completed 10/27; patient ready for discharge; SLP signing off.    Patient has met 6 of 7 long term goals.  Patient to discharge at [facility] Max;Mod level.  Reasons goals not met:  Patient continues to require Mod assist for dysphagia treatment.   Clinical Impression/Discharge Summary:  Patient has made functional gains during this rehab admission and has met 6 out of 7 long term goals due to improved basic functional abilities.  Patient is currently an overall Max assist for basic, familiar cognitive-linguistic tasks and requires set-up and Mod assist for utilization of swallowing compensatory strategies to minimize overt s/s of aspiration with Dys.2 textures and thin liquids.  Patient and family education has been completed and patient will discharge home with 24 hour supervision. Patient would benefit from follow up SLP services to continue efforts to maximize self-care skills to further reduce the burden of care.   Care Partner:  Caregiver Able to Provide Assistance: Yes  Type of Caregiver Assistance: Physical;Cognitive  Recommendation:  24  hour supervision/assistance;Home Health SLP  Rationale for SLP Follow Up: Maximize functional communication;Maximize cognitive function and independence;Maximize swallowing safety;Reduce caregiver burden   Equipment: none   Reasons for discharge: Discharged from hospital;Treatment goals met   Patient/Family Agrees with Progress Made and Goals Achieved: Yes   Function:  Eating Eating   Modified Consistency Diet: Yes Eating Assist Level: Set up assist for;Supervision or verbal cues;Helper checks for pocketed food;Helper scoops food on utensil;Help with picking up utensils;Help managing cup/glass   Eating Set Up Assist For: Opening containers;Cutting food Helper Scoops Food on Utensil: Occasionally Helper Brings Food to Mouth: Occasionally   Cognition Comprehension Comprehension assist level: Understands basic 50 - 74% of the time/ requires cueing 25 - 49% of the time  Expression   Expression assist level: Expresses basic 25 - 49% of the time/requires cueing 50 - 75% of the time. Uses single words/gestures.  Social Interaction Social Interaction assist level: Interacts appropriately 25 - 49% of time - Needs frequent redirection.  Problem Solving Problem solving assist level: Solves basic 25 - 49% of the time - needs direction more than half the time to initiate, plan or complete simple activities  Memory Memory assist level: Recognizes or recalls 25 - 49% of the time/requires cueing 50 - 75% of the time   Carmelia Roller., CCC-SLP [phone]  [place] 08/06/2015, 1:56 PM

## 2015-08-06 NOTE — Progress Notes (Signed)
Occupational Therapy Session Note  Patient Details  Name: Gail Chapman MRN: [id number] Date of Birth: 1956/03/17  Today's Date: 08/06/2015 OT Individual Time: 1000-1100 OT Individual Time Calculation (min): 60 min    Short Term Goals: Week 2:  OT Short Term Goal 1 (Week 2): Pt will bathe UB with min A OT Short Term Goal 2 (Week 2): Pt will transfer to toilet with squat pivot of max A + 1 OT Short Term Goal 3 (Week 2): Pt will don shirt with mod A OT Short Term Goal 4 (Week 2): Pt will initiate and brush teeth with supervision and min verbal cues  Skilled Therapeutic Interventions/Progress Updates:    Pt resting in w/c upon arrival and agreeable to therapy this morning.  Pt more alert this morning and responded quicker to verbal cues.  Pt engaged in BADL retraining including bathing/dressing with sit<>stand from w/c at sink, toilet transfers and toileting.  Pt performed transfers and sit<>stand this morning with max A and was able to maintain upright standing position with steady A.  Pt required min verbal cues to initiate all tasks and for redirection to task during performance.  Pt required more than a reasonable amount of time to initiate and complete all tasks.  Pt was cheerful this morning and happry she is discharging home tomorrow.  Therapy Documentation Precautions:  Precautions Precautions: Fall Precaution Comments: hx of MS with baseline visual changes Restrictions Weight Bearing Restrictions: No Pain:  Pt denied pain this morning  See Function Navigator for Current Functional Status.   Therapy/Group: Individual Therapy  Rich Brave 08/06/2015, 11:01 AM

## 2015-08-06 NOTE — Progress Notes (Signed)
Physical Therapy Discharge Summary  Patient Details  Name: Gail Chapman MRN: [id number] Date of Birth: 12-Nov-1955  Today's Date: 08/06/2015 PT Individual Time: 4098-1191 PT Individual Time Calculation (min): 50 min    Patient has met 3 of 8 long term goals due to improved activity tolerance, improved balance, improved postural control and improved attention.  Patient to discharge at a wheelchair level Max Assist.   Patient's care partner is independent to provide the necessary physical and cognitive assistance at discharge.  Reasons goals not met: Patient continues to require max assist with bed mobility, transfers, car transfers, attention, and awareness.   Recommendation:  Patient will benefit from ongoing skilled PT services in home health setting to continue to advance safe functional mobility, address ongoing impairments in muscle weakness, muscle joint tightness, decreased endurance, impaired timing and sequencing, abnormal tone, unbalanced muscle activation, decreased coordination, decreased motor planning, decreased visual acuity and decreased visual perceptual skills, decreased midline orientation, decreased initiation, decreased sustained attention, decreased awareness, decreased problem solving, decreased safety awareness, decreased memory, delayed processing, decreased sitting balance, decreased standing balance, decreased postural control and decreased balance strategies, and minimize fall risk.  Equipment: manual 16x16 wheelchair and cushion  Reasons for discharge: treatment goals met and discharge from hospital  Patient/family agrees with progress made and goals achieved: Yes  Skilled Therapeutic Intervention Patient awake and alert in wheelchair upon arrival. Focus of session on functional mobility training and activity tolerance in preparation for discharge home with husband providing 24/7 assist. Patient demonstrated improved initiation and activity tolerance with  wheelchair mobility using BUE x 30 ft with supervision until she fatigued. Performed stand pivot transfers x 2 with max A, improved upright posture with first transfer with progressive decline with forward flexion and inability to achieve trunk/hip extension to neutral due to fatigue. Patient performed static sitting balance edge of mat with supervision until she fatigued and laid back on table, required max A to return to sitting but improved ability to follow basic one step commands to push self back upright. Performed simulated car transfer to sedan height with max A. Patient demonstrating significant fatigue and fell asleep multiple times in chair, requiring max multimodal cues for arousal and prolonged seated rest breaks between tasks. Patient requesting to lie down but upon returning to room and seeing husband waiting for her, she appeared to become more alert again and was agreeable to sit up to visit with him. Patient and husband with no further questions or concerns regarding discharge.   PT Discharge Precautions Precautions: Fall Precaution Comments: hx of MS with baseline visual changes Restrictions Weight Bearing Restrictions: No Pain Pain Assessment Pain Assessment: No/denies pain Vision/Perception   Defer to OT discharge summary  Cognition Overall Cognitive Status: Impaired/Different from baseline Arousal/Alertness: Awake/alert Orientation Level: Oriented to person;Oriented to place;Oriented to situation Attention: Sustained;Focused Focused Attention: Appears intact Focused Attention Impairment: Verbal basic;Functional basic Sustained Attention: Impaired Sustained Attention Impairment: Functional basic;Verbal basic Memory: Impaired Awareness: Impaired Awareness Impairment: Emergent impairment;Intellectual impairment Problem Solving: Impaired Problem Solving Impairment: Verbal basic;Functional basic Executive Function:  (all remain impaired due to lower level  deficits) Behaviors: Perseveration Safety/Judgment: Impaired Sensation Sensation Light Touch: Appears Intact Stereognosis: Not tested Hot/Cold: Appears Intact Proprioception: Appears Intact Coordination Gross Motor Movements are Fluid and Coordinated: No Fine Motor Movements are Fluid and Coordinated: No Heel Shin Test: unable to perform due to BLE weakness Motor  Motor Motor: Abnormal postural alignment and control;Hemiplegia  Mobility Bed Mobility Bed Mobility: Sit to Supine;Supine to Sit  Supine to Sit: 2: Max assist Supine to Sit Details: Tactile cues for initiation;Manual facilitation for weight shifting;Manual facilitation for placement;Verbal cues for technique Sit to Supine: 2: Max assist Sit to Supine - Details: Tactile cues for initiation;Verbal cues for technique;Verbal cues for sequencing;Manual facilitation for weight shifting;Manual facilitation for placement Transfers Transfers: Yes Stand Pivot Transfers: 2: Max assist Stand Pivot Transfer Details: Tactile cues for initiation;Verbal cues for technique;Verbal cues for precautions/safety;Verbal cues for sequencing;Manual facilitation for weight shifting;Manual facilitation for placement Locomotion  Ambulation Ambulation: No (unsafe to attempt due to fatigue and weakness) Gait Gait: No Stairs / Additional Locomotion Stairs: No (unsafe to attempt due to fatigue and weakness) Ramp: Not tested (comment) Curb: 1: +1 Total assist (bumping up in wheelchair) Wheelchair Mobility Wheelchair Mobility: Yes Wheelchair Assistance: 5: Supervision Wheelchair Assistance Details: Verbal cues for technique;Verbal cues for Astronomer: Both upper extremities Wheelchair Parts Management: Supervision/cueing Distance: 30 ft with min > max cues for attention to task  Trunk/Postural Assessment  Cervical Assessment Cervical Assessment: Exceptions to [facility] (forward head, laterally flexed to L) Thoracic  Assessment Thoracic Assessment: Exceptions to [facility] (kyphotic, lateral lean to L) Lumbar Assessment Lumbar Assessment: Exceptions to [facility] (decreased lordosis, laterally flexed to L) Postural Control Postural Control: Deficits on evaluation Trunk Control: decreased trunk control, decreased ability to initiate forward lean to aide in transfers with progressive lateral lean to L  Righting Reactions: max impaired Protective Responses: max impaired  Balance Static Sitting Balance Static Sitting - Balance Support: Bilateral upper extremity supported;Feet supported Static Sitting - Level of Assistance: 5: Stand by assistance Dynamic Sitting Balance Dynamic Sitting - Balance Support: Feet supported Dynamic Sitting - Level of Assistance: 4: Min assist Extremity Assessment  RUE Assessment RUE Assessment: Exceptions to [facility] RUE AROM (degrees) Right Shoulder Flexion: 150 Degrees RUE Strength RUE Overall Strength Comments: 3+/5  LUE Assessment LUE Assessment: Exceptions to WFL LUE AROM (degrees) Left Shoulder Flexion: 110 Degrees LUE Strength Left Shoulder Flexion: 3+/5 Left Elbow Flexion: 3+/5 Left Hand Gross Grasp: Impaired RLE Assessment RLE Assessment: Exceptions to [facility] RLE Strength RLE Overall Strength: Deficits;Due to impaired cognition;Due to premorbid status RLE Overall Strength Comments: grossly 3-/5 to 3/5 throughout LLE Assessment LLE Assessment: Exceptions to [facility] LLE Strength LLE Overall Strength: Deficits;Due to impaired cognition;Due to premorbid status LLE Overall Strength Comments: grossly 3-/5 throughout   See Function Navigator for Current Functional Status.  Laretta Alstrom 08/06/2015, 3:18 PM

## 2015-08-06 NOTE — Progress Notes (Signed)
Social Work  Discharge Note  The overall goal for the admission was met for:   Discharge location: Yes - home with husband, sister and daughter providing 24/7  Length of Stay: Yes - 12 days  Discharge activity level: No - most goals not met due to slow, limited gains.  DC'd at mod/max assist w/c overall  Home/community participation: No  Services provided included: MD, RD, PT, OT, SLP, RN, TR, Pharmacy and SW  Financial Services: Private Insurance: [place]  Follow-up services arranged: Home Health: PT, ST, SW via [facility], DME: 16x16 lightweight w/c with basic cushion via [place] and Patient/Family has no preference for HH/DME agencies  Comments (or additional information):  Patient/Family verbalized understanding of follow-up arrangements: Yes  Individual responsible for coordination of the follow-up plan: pt/ spouse  Confirmed correct DME delivered: Elouise Divelbiss 08/06/2015    Elnor Renovato

## 2015-08-07 DIAGNOSIS — I639 Cerebral infarction, unspecified: Secondary | ICD-10-CM | POA: Diagnosis present

## 2015-08-07 LAB — GLUCOSE, CAPILLARY: GLUCOSE-CAPILLARY: 87 mg/dL (ref 65–99)

## 2015-08-07 NOTE — Progress Notes (Signed)
[facility] Progress Note  Subjective/Complaints: Slept well. Realizes that she's going home today.     ROS: limited due to cognition.  However appears to deny CP, SOB, n/v/d.   Objective: Vital Signs: Blood pressure 152/76, pulse 72, temperature 97.6 F (36.4 C), temperature source Oral, resp. rate 18, height 5' 1.5" (1.562 m), weight 62.8 kg (138 lb 7.2 oz), SpO2 100 %. No results found. Results for orders placed or performed during the hospital encounter of 07/26/15 (from the past 72 hour(s))  Glucose, capillary     Status: Abnormal   Collection Time: 08/04/15 11:25 AM  Result Value Ref Range   Glucose-Capillary 107 (H) 65 - 99 mg/dL  Glucose, capillary     Status: None   Collection Time: 08/04/15  7:24 PM  Result Value Ref Range   Glucose-Capillary 87 65 - 99 mg/dL  Glucose, capillary     Status: None   Collection Time: 08/05/15  6:49 AM  Result Value Ref Range   Glucose-Capillary 85 65 - 99 mg/dL  Glucose, capillary     Status: None   Collection Time: 08/05/15 11:47 AM  Result Value Ref Range   Glucose-Capillary 83 65 - 99 mg/dL   Comment 1 Notify RN   Glucose, capillary     Status: Abnormal   Collection Time: 08/05/15  4:38 PM  Result Value Ref Range   Glucose-Capillary 114 (H) 65 - 99 mg/dL   Comment 1 Notify RN   Glucose, capillary     Status: Abnormal   Collection Time: 08/05/15  9:22 PM  Result Value Ref Range   Glucose-Capillary 127 (H) 65 - 99 mg/dL  Glucose, capillary     Status: Abnormal   Collection Time: 08/05/15 10:28 PM  Result Value Ref Range   Glucose-Capillary 145 (H) 65 - 99 mg/dL  Glucose, capillary     Status: None   Collection Time: 08/06/15  6:46 AM  Result Value Ref Range   Glucose-Capillary 70 65 - 99 mg/dL  Glucose, capillary     Status: Abnormal   Collection Time: 08/06/15 11:48 AM  Result Value Ref Range   Glucose-Capillary 134 (H) 65 - 99 mg/dL   Comment 1 Notify RN   Culture, blood (routine x 2)      Status: None (Preliminary result)   Collection Time: 08/06/15  5:30 PM  Result Value Ref Range   Specimen Description BLOOD CENTRAL LINE    Special Requests BOTTLES DRAWN AEROBIC AND ANAEROBIC 10ML    Culture PENDING    Report Status PENDING   Culture, blood (routine x 2)     Status: None (Preliminary result)   Collection Time: 08/06/15  5:30 PM  Result Value Ref Range   Specimen Description BLOOD CENTRAL LINE    Special Requests BOTTLES DRAWN AEROBIC AND ANAEROBIC 10ML    Culture PENDING    Report Status PENDING   CBC with Differential/Platelet     Status: Abnormal   Collection Time: 08/06/15  5:58 PM  Result Value Ref Range   WBC 11.6 (H) 4.0 - 10.5 K/uL   RBC 3.53 (L) 3.87 - 5.11 MIL/uL   Hemoglobin 9.8 (L) 12.0 - 15.0 g/dL   HCT 31.5 (L) 36.0 - 46.0 %   MCV 89.2 78.0 - 100.0 fL   MCH 27.8 26.0 - 34.0 pg   MCHC 31.1 30.0 - 36.0 g/dL   RDW 16.0 (H) 11.5 - 15.5 %   Platelets 158 150 - 400 K/uL   Neutrophils  Relative % 72 %   Neutro Abs 8.3 (H) 1.7 - 7.7 K/uL   Lymphocytes Relative 14 %   Lymphs Abs 1.6 0.7 - 4.0 K/uL   Monocytes Relative 13 %   Monocytes Absolute 1.5 (H) 0.1 - 1.0 K/uL   Eosinophils Relative 1 %   Eosinophils Absolute 0.1 0.0 - 0.7 K/uL   Basophils Relative 0 %   Basophils Absolute 0.0 0.0 - 0.1 K/uL  Renal function panel     Status: Abnormal   Collection Time: 08/06/15  5:59 PM  Result Value Ref Range   Sodium 131 (L) 135 - 145 mmol/L   Potassium 4.8 3.5 - 5.1 mmol/L   Chloride 90 (L) 101 - 111 mmol/L   CO2 23 22 - 32 mmol/L   Glucose, Bld 114 (H) 65 - 99 mg/dL   BUN 53 (H) 6 - 20 mg/dL   Creatinine, Ser 5.54 (H) 0.44 - 1.00 mg/dL   Calcium 8.9 8.9 - 10.3 mg/dL   Phosphorus 6.7 (H) 2.5 - 4.6 mg/dL   Albumin 2.6 (L) 3.5 - 5.0 g/dL   GFR calc non Af Amer 8 (L) >60 mL/min   GFR calc Af Amer 9 (L) >60 mL/min    Comment: (NOTE) The eGFR has been calculated using the CKD EPI equation. This calculation has not been validated in all clinical  situations. eGFR's persistently <60 mL/min signify possible Chronic Kidney Disease.    Anion gap 18 (H) 5 - 15  Glucose, capillary     Status: None   Collection Time: 08/06/15  9:43 PM  Result Value Ref Range   Glucose-Capillary 90 65 - 99 mg/dL  Glucose, capillary     Status: None   Collection Time: 08/07/15  7:23 AM  Result Value Ref Range   Glucose-Capillary 87 65 - 99 mg/dL     Gen: NAD.  VS reviewed.  HEENT: Normocephalic, atraumatic Eyes: Conj WNL, EOMI Cardio: RRR and murmur Resp: CTA B/L and unlabored GI: BS positive and NT, ND Skin:   Intact. Warm and Dry. MSK:   Likely 4/5 grossly throughout, however, poor effort.   Neurological: She is alert and oriented x3.   Patient disoriented. Lacks insight and awareness of deficits. Able to follow simple motor commands without difficulty.  Skin: Skin is warm and dry. No rash noted. She is not diaphoretic. No erythema.  Psychiatric: Her affect is blunt. Her speech is delayed. She is slowed. Cognition and memory are impaired.    Assessment/Plan: 1. 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 H/o MS with chronic paraplegia.    which require 3+ hours per day of interdisciplinary therapy in a comprehensive inpatient rehab setting. Physiatrist is providing close team supervision and 24 hour management of active medical problems listed below. Physiatrist and rehab team continue to assess barriers to discharge/monitor patient progress toward functional and medical goals. FIM: Function - Bathing Bathing activity did not occur: Refused Position: Bed Body parts bathed by patient: Right arm, Left arm, Chest, Abdomen, Front perineal area, Right upper leg, Left upper leg Body parts bathed by helper: Buttocks, Right lower leg, Left lower leg, Back Assist Level: 2 helpers  Function- Upper Body Dressing/Undressing What is the patient wearing?: Pull over shirt/dress Pull over shirt/dress -  Perfomed by patient: Put head through opening, Thread/unthread right sleeve, Thread/unthread left sleeve Pull over shirt/dress - Perfomed by helper: Pull shirt over trunk Assist Level: Touching or steadying assistance(Pt > 75%) Function - Lower Body Dressing/Undressing  What is the patient wearing?: Underwear, Pants, Non-skid slipper socks Position: Bed Underwear - Performed by patient: Thread/unthread right underwear leg, Thread/unthread left underwear leg Underwear - Performed by helper: Pull underwear up/down Pants- Performed by patient: Thread/unthread right pants leg, Thread/unthread left pants leg Pants- Performed by helper: Pull pants up/down Non-skid slipper socks- Performed by helper: Don/doff right sock, Don/doff left sock Assist for footwear: Dependant Assist for lower body dressing: 2 Helpers  Function - Toileting Toileting activity did not occur: N/A Toileting steps completed by patient: Performs perineal hygiene Toileting steps completed by helper: Adjust clothing prior to toileting, Adjust clothing after toileting Toileting Assistive Devices: Grab bar or rail Assist level: Two helpers  Function - Air cabin crew transfer activity did not occur: N/A Toilet transfer assistive device: Elevated toilet seat/BSC over toilet, Grab bar Mechanical lift: Stedy Assist level to toilet: Maximal assist (Pt 25 - 49%/lift and lower) Assist level from toilet: Maximal assist (Pt 25 - 49%/lift and lower)  Function - Chair/bed transfer Chair/bed transfer method: Stand pivot Chair/bed transfer assist level: Maximal assist (Pt 25 - 49%/lift and lower) Chair/bed transfer assistive device: Armrests Chair/bed transfer details: Verbal cues for technique, Manual facilitation for weight shifting, Manual facilitation for placement, Tactile cues for initiation  Function - Locomotion: Wheelchair Will patient use wheelchair at discharge?: Yes Type: Manual Max wheelchair distance:  30 Assist Level: Supervision or verbal cues Wheel 50 feet with 2 turns activity did not occur: Safety/medical concerns Assist Level: Dependent (Pt equals 0%) Wheel 150 feet activity did not occur: Safety/medical concerns Assist Level: Dependent (Pt equals 0%) Turns around,maneuvers to table,bed, and toilet,negotiates 3% grade,maneuvers on rugs and over doorsills: No Function - Locomotion: Ambulation Ambulation activity did not occur: Safety/medical concerns (unsafe to attempt ambulation due to fatigue/weakness) Assistive device: Walker-rolling Max distance: 10 Assist level: Maximal assist (Pt 25 - 49%) Walk 10 feet activity did not occur: Safety/medical concerns Assist level: Maximal assist (Pt 25 - 49%) Walk 50 feet with 2 turns activity did not occur: Safety/medical concerns Walk 150 feet activity did not occur: Safety/medical concerns Walk 10 feet on uneven surfaces activity did not occur: Safety/medical concerns  Function - Comprehension Comprehension: Auditory Comprehension assist level: Understands basic 50 - 74% of the time/ requires cueing 25 - 49% of the time  Function - Expression Expression: Verbal Expression assist level: Expresses basic 25 - 49% of the time/requires cueing 50 - 75% of the time. Uses single words/gestures.  Function - Social Interaction Social Interaction assist level: Interacts appropriately 25 - 49% of time - Needs frequent redirection.  Function - Problem Solving Problem solving assist level: Solves basic 25 - 49% of the time - needs direction more than half the time to initiate, plan or complete simple activities  Function - Memory Memory assist level: Recognizes or recalls 25 - 49% of the time/requires cueing 50 - 75% of the time Patient normally able to recall (first 3 days only): That he or she is in a hospital  Medical Problem List and Plan: 1. 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 H/o MS.    -dc home today  -husband educated for medical and functional needs  2.  DVT Prophylaxis/Anticoagulation: ECASA, SCD's, TEDS  -off heparin 3. Pain Management: Tylenol prn   4. Mood: LCSW to follow for evaluation and support.   5. Neuropsych: This patient is not capable of making decisions on her own behalf. 6. Skin/Wound Care: Routine pressure relief measures 7. Fluids/Electrolytes/Nutrition: Monitor  I/O. 1200 cc FR.  Labs with HD.   8. 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    9. HTN: Need to prevent hypotension to prevent recurrent watershed infarcts.  continue to educate husband on need for bp control  --sbp's acceptable in the 140's to 150's at this time 10. ESRD: Has history of orhtostatic changes.   11. OSA: Continue CPAP at bedtime/sleeping.   12. E coli Sepsis: continue Rocephin.   13. Arrthymias:  bp's generally better. Education to husband provided on meds/bp's 37. DM type 2: Husband does not want BS covered if under 120.  Will monitor BS ac/hs. BS well controlled off lantus insulin---tight control-  15. COPD: Uses oxygen prn with activity.  16. Anemia: Hb 9.8----will need further follow up with HD/neprho  17. Leukocytosis: Ucx with multispecies---will recollect sample today  -no further diarrhea to suggest c diff  -stopped ceftriaxone as she's been afebrile and has been on since at least 10/17  -wbc's down to 11.6 yesterday 18. Thrombocytopenia: Now trending up after d/cing heparin. Recheck today in HD    19. Seizures: continue depakote at $RemoveBef'750mg'[id number]$  bid dose---education important    LOS (Days) 12 A FACE TO FACE EVALUATION WAS PERFORMED  Maddelyn Rocca T 08/07/2015, 8:41 AM

## 2015-08-07 NOTE — Progress Notes (Signed)
Order received to discharge patient. Discharge instructions given to spouse. Pt left in wheelchair @ 0900 with husband and tech assisting.

## 2015-08-07 NOTE — Plan of Care (Signed)
Problem: RH BOWEL ELIMINATION Goal: RH STG MANAGE BOWEL WITH ASSISTANCE STG Manage Bowel with Max Assistance.  Outcome: Not Progressing No bm since 10/25 despite senna

## 2015-08-08 NOTE — Progress Notes (Signed)
Was contacted by lab last night about 2/2 + cultures for G+ cocci in clusters. Spoke with Dr. Briant Cedar also---probably line infection. Contacted husband this morning. Patient is doing fine. Will call HD in [place] in the morning and provide information to them---plan is to give vancomycin in HD starting tomorrow.  Husband was advised that if she developed a fever, chills, etc in the meantime, he should take her to the ED in [place].  Ranelle Oyster, MD, [facility] 08/08/2015

## 2015-08-10 LAB — CULTURE, BLOOD (ROUTINE X 2)

## 2015-08-10 NOTE — Progress Notes (Signed)
Faxed information to [facility] on [address] yesterday. Talked with Melissa RN at center and updated her on patient. Called them back today to inform that I was faxing results of sensitivities. Melissa relayed that patient did not show up yesterday and the center was unable to get in touch with her.   They are very familiar with the patient.  She asked for information regarding duration of antibiotics.  I recommended input from ID and nephrology to make decision on duration of therapy.

## 2015-09-07 ENCOUNTER — Telehealth: Payer: Self-pay

## 2015-09-07 MED ORDER — DIVALPROEX SODIUM 250 MG PO DR TAB: 750.0000 mg | DELAYED_RELEASE_TABLET | Freq: Two times a day (BID) | ORAL | Status: DC

## 2015-09-07 NOTE — Telephone Encounter (Signed)
Sent in meds, electronically. Received fax request

## 2015-09-14 ENCOUNTER — Encounter: Payer: BLUE CROSS/BLUE SHIELD | Admitting: Physical Medicine & Rehabilitation

## 2015-09-20 ENCOUNTER — Inpatient Hospital Stay: Payer: BLUE CROSS/BLUE SHIELD | Admitting: Physical Medicine & Rehabilitation

## 2015-09-22 ENCOUNTER — Ambulatory Visit: Payer: Self-pay | Admitting: Neurology

## 2015-09-28 ENCOUNTER — Encounter
Payer: BLUE CROSS/BLUE SHIELD | Attending: Physical Medicine & Rehabilitation | Admitting: Physical Medicine & Rehabilitation

## 2015-09-28 ENCOUNTER — Encounter: Payer: Self-pay | Admitting: Physical Medicine & Rehabilitation

## 2015-09-28 VITALS — BP 221/99 | HR 73 | Resp 12

## 2015-09-28 DIAGNOSIS — I272 Other secondary pulmonary hypertension: Secondary | ICD-10-CM | POA: Diagnosis not present

## 2015-09-28 DIAGNOSIS — Z8249 Family history of ischemic heart disease and other diseases of the circulatory system: Secondary | ICD-10-CM | POA: Diagnosis not present

## 2015-09-28 DIAGNOSIS — N186 End stage renal disease: Secondary | ICD-10-CM | POA: Diagnosis not present

## 2015-09-28 DIAGNOSIS — I120 Hypertensive chronic kidney disease with stage 5 chronic kidney disease or end stage renal disease: Secondary | ICD-10-CM | POA: Diagnosis not present

## 2015-09-28 DIAGNOSIS — R569 Unspecified convulsions: Secondary | ICD-10-CM | POA: Insufficient documentation

## 2015-09-28 DIAGNOSIS — Z87891 Personal history of nicotine dependence: Secondary | ICD-10-CM | POA: Insufficient documentation

## 2015-09-28 DIAGNOSIS — R42 Dizziness and giddiness: Secondary | ICD-10-CM | POA: Diagnosis not present

## 2015-09-28 DIAGNOSIS — I6340 Cerebral infarction due to embolism of unspecified cerebral artery: Secondary | ICD-10-CM | POA: Diagnosis not present

## 2015-09-28 DIAGNOSIS — I5032 Chronic diastolic (congestive) heart failure: Secondary | ICD-10-CM | POA: Diagnosis not present

## 2015-09-28 DIAGNOSIS — J449 Chronic obstructive pulmonary disease, unspecified: Secondary | ICD-10-CM | POA: Insufficient documentation

## 2015-09-28 DIAGNOSIS — G35 Multiple sclerosis: Secondary | ICD-10-CM | POA: Diagnosis not present

## 2015-09-28 DIAGNOSIS — Z9981 Dependence on supplemental oxygen: Secondary | ICD-10-CM | POA: Insufficient documentation

## 2015-09-28 DIAGNOSIS — G4733 Obstructive sleep apnea (adult) (pediatric): Secondary | ICD-10-CM | POA: Diagnosis not present

## 2015-09-28 MED ORDER — BACLOFEN 10 MG PO TABS: 5.0000 mg | ORAL_TABLET | Freq: Two times a day (BID) | ORAL | Status: DC | PRN

## 2015-09-28 NOTE — Patient Instructions (Signed)
PLEASE MAKE AN EFFORT TO FOLLOW UP WITH A NEPHROLOGIST REGARDING YOUR KIDNEYS   PLEASE CALL ME WITH ANY PROBLEMS OR QUESTIONS [phone]). HAVE A HAPPY HOLIDAY SEASON!!!

## 2015-09-28 NOTE — Progress Notes (Signed)
Subjective:    Patient ID: Gail Chapman, female    DOB: 1956-02-27, 59 y.o.   MRN: [id number]  HPI   Gail Chapman is here in follow up of her bilateral embolic CVA's She has been home for about 7 weeks. She continues in HD in [place]. Husband reports ongoing problems with HD where she has more confusion, nausea afterwards. She hasn't been to HD for a week because of the reported side effect===husband reports she's been much better being off HD for that long. She is making regular fluid. She hasn't had any swelling.   Her BP is quite elevated today.   Speech and PT are coming out to the house to work with her. PT is working on walking and lower extremity strength. She has been walking with a RW.   Sleep is usually good. She had a restless night last night. Her legs were cramping.   Pain Inventory Average Pain 0 Pain Right Now 0 My pain is NA  In the last 24 hours, has pain interfered with the following? General activity 0 Relation with others 0 Enjoyment of life 8 What TIME of day is your pain at its worst? night Sleep (in general) NA  Pain is worse with: sitting Pain improves with: therapy/exercise Relief from Meds: 0  Mobility walk with assistance how many minutes can you walk? 5 ability to climb steps?  no do you drive?  no use a wheelchair  Function disabled: date disabled 2010 I need assistance with the following:  dressing, bathing, toileting, meal prep, household duties and shopping  Neuro/Psych bladder control problems weakness trouble walking dizziness confusion  Prior Studies NA  Physicians involved in your care NA   Family History  Problem Relation Age of Onset  . Diabetes Mother   . CAD Mother   . Cancer Mother   . Hypertension Mother   . Cancer Father   . Diabetes Father   . Hyperlipidemia Father   . Hypertension Father   . Heart attack Father   . Alcohol abuse Father   . Heart disease Father   . Alcohol abuse Sister   . Alcohol  abuse Brother   . Drug abuse Brother    Social History   Social History  . Marital Status: Married    Spouse Name: N/A  . Number of Children: N/A  . Years of Education: N/A   Social History Main Topics  . Smoking status: Former Games developer  . Smokeless tobacco: None  . Alcohol Use: No  . Drug Use: No     Comment: "smoked weed"  . Sexual Activity: Not Asked   Other Topics Concern  . None   Social History Narrative   Lives in [place] with her husband.  They have 2 grown children.  She is on disability.   Past Surgical History  Procedure Laterality Date  . Knee surgery    . Carpal tunnel release    . Tubal ligation    . Cholecystectomy    . Mass excision      a. under axilla.  . Subxyphoid pericardial window N/A 02/26/2013    Procedure: SUBXYPHOID PERICARDIAL WINDOW;  Surgeon: Delight Ovens, MD;  [facility];  Service: Thoracic;  Laterality: N/A;  . Left and right heart catheterization with coronary angiogram N/A 02/25/2013    Procedure: LEFT AND RIGHT HEART CATHETERIZATION WITH CORONARY ANGIOGRAM;  Surgeon: Dolores Patty, MD;  Location: [facility] CATH LAB;  Service: Cardiovascular;  Laterality: N/A;  .  Tee without cardioversion N/A 07/23/2015    Procedure: TRANSESOPHAGEAL ECHOCARDIOGRAM (TEE);  Surgeon: Pricilla Riffle, MD;  Location: [facility] ENDOSCOPY;  Service: Cardiovascular;  Laterality: N/A;  PT NEED A LOOP   Past Medical History  Diagnosis Date  . Diabetes mellitus without complication (HCC)   . Chronic diastolic CHF (congestive heart failure) (HCC)     a. 01/2007 MV: No isch/infarct, nl EF;  b. 2012 Cath: "normal" per patient.  Performed in [place], [place] by Dr. Graciela Husbands.  . Pulmonary hypertension (HCC)     a. on home O2 @ 3lpm 24hrs/day  . HTN (hypertension)     a. Dx in 1993.  Marland Kitchen LVH (left ventricular hypertrophy)   . OSA (obstructive sleep apnea)     a. uses CPAP  . Physical deconditioning   . Renal disorder   . COPD (chronic obstructive pulmonary disease) (HCC)     BP 221/99 mmHg  Pulse 73  Resp 12  SpO2 99%  Opioid Risk Score:   Fall Risk Score:  `1  Depression screen PHQ 2/9  No flowsheet data found.   Review of Systems  Constitutional: Positive for diaphoresis and unexpected weight change.       Bladder control problems  Respiratory: Positive for apnea.   Gastrointestinal: Positive for nausea.  Endocrine:       High blood sugar  Musculoskeletal: Positive for gait problem.  Neurological: Positive for dizziness and weakness.  Psychiatric/Behavioral: Positive for confusion.  All other systems reviewed and are negative.      Objective:   Physical Exam  Gen: NAD. VS reviewed.  HEENT: Normocephalic, atraumatic Eyes: Conj WNL, EOMI Cardio: RRR and murmur Resp: CTA B/L and unlabored GI: BS positive and NT, ND Skin: Intact. Warm and Dry. MSK:  Likely 4/5 grossly throughout, however, poor effort.  Neurological: She is alert and oriented x3.  Oriented to place, month, day, not year, name.  Able to follow simple motor commands without difficulty.  Skin: Skin is warm and dry. No rash noted. She is not diaphoretic. No erythema.  Psychiatric: Her affect is blunt. Her speech is delayed. She is slowed. Cognition and memory are impaired.        Assessment & Plan:  Medical Problem List and Plan: 1. 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 H/o MS.  -add prn baclofen for muscle spasms/cramps. Can use HS primarily  -stretching, exercise with HHPT as well.  2.Pain Management: Tylenol prn  3. 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  4. HTN: per primary. Needs to take meds today! 5. ESRD: HD per renal. encouraged follow up even with it's a new provider 6. DM type 2:  Per primary, husband often determines how much she gets-  7. COPD: Uses oxygen prn with activity.   Follow up with me in about 3 months. Fifteen minutes of face to face  patient care time were spent during this visit. All questions were encouraged and answered.

## 2015-10-06 ENCOUNTER — Other Ambulatory Visit (HOSPITAL_COMMUNITY): Payer: Self-pay | Admitting: Internal Medicine

## 2015-12-13 ENCOUNTER — Ambulatory Visit: Payer: Self-pay | Admitting: Neurology

## 2015-12-14 ENCOUNTER — Encounter: Payer: Self-pay | Admitting: Neurology

## 2015-12-27 ENCOUNTER — Encounter: Payer: BLUE CROSS/BLUE SHIELD | Admitting: Physical Medicine & Rehabilitation

## 2016-01-12 ENCOUNTER — Ambulatory Visit: Payer: BLUE CROSS/BLUE SHIELD | Admitting: Neurology

## 2016-02-10 ENCOUNTER — Telehealth: Payer: Self-pay | Admitting: Neurology

## 2016-02-10 ENCOUNTER — Encounter: Payer: Self-pay | Admitting: Neurology

## 2016-02-10 ENCOUNTER — Ambulatory Visit (INDEPENDENT_AMBULATORY_CARE_PROVIDER_SITE_OTHER): Payer: Medicare Other | Admitting: Neurology

## 2016-02-10 VITALS — BP 196/101 | HR 63 | Ht 61.0 in

## 2016-02-10 DIAGNOSIS — R569 Unspecified convulsions: Secondary | ICD-10-CM | POA: Diagnosis not present

## 2016-02-10 DIAGNOSIS — I161 Hypertensive emergency: Secondary | ICD-10-CM

## 2016-02-10 DIAGNOSIS — I674 Hypertensive encephalopathy: Secondary | ICD-10-CM

## 2016-02-10 DIAGNOSIS — N186 End stage renal disease: Secondary | ICD-10-CM | POA: Diagnosis not present

## 2016-02-10 DIAGNOSIS — I6340 Cerebral infarction due to embolism of unspecified cerebral artery: Secondary | ICD-10-CM

## 2016-02-10 DIAGNOSIS — G35 Multiple sclerosis: Secondary | ICD-10-CM

## 2016-02-10 MED ORDER — LACOSAMIDE 50 MG PO TABS: 50.0000 mg | ORAL_TABLET | Freq: Two times a day (BID) | ORAL | Status: DC

## 2016-02-10 MED ORDER — AMLODIPINE BESYLATE 5 MG PO TABS: 5.0000 mg | ORAL_TABLET | Freq: Every day | ORAL | Status: AC

## 2016-02-10 MED ORDER — LACOSAMIDE 100 MG PO TABS: 50.0000 mg | ORAL_TABLET | Freq: Two times a day (BID) | ORAL | Status: DC

## 2016-02-10 MED ORDER — BACLOFEN 10 MG PO TABS: 5.0000 mg | ORAL_TABLET | Freq: Every evening | ORAL | Status: AC | PRN

## 2016-02-10 NOTE — Patient Instructions (Signed)
-   continue ASA and plavix for stroke prevention - will add amlodipine  for better BP control - will do EEG for seizure evaluation - will add vimpat for seizure control.  - Follow up with your doctor for stroke risk factor modification. Recommend maintain blood pressure goal <130/80, diabetes with hemoglobin A1c goal below 6.5% and lipids with LDL cholesterol goal below 70 mg/dL.  - check BP at home and BP goal 120-140 - follow up in 2 months.

## 2016-02-10 NOTE — Telephone Encounter (Signed)
Pt's husband called said [facility]  said the company for vimpat will not honor the discount coupon(for 2 weeks free medication) said she has a coupon before. Pt's husband sts she has never taken vimpat before and does not understand and cannot afford the medication, it will cost over $200. Please call

## 2016-02-10 NOTE — Progress Notes (Signed)
STROKE NEUROLOGY FOLLOW UP NOTE  NAME: Gail Chapman DOB: [birth year]  REASON FOR VISIT: stroke follow up HISTORY FROM: chart and husband  Today we had the pleasure of seeing Gail Chapman in follow-up at our Neurology Clinic. Pt was accompanied by husband.   History Summary [name] is a 61 y.o. female with history of MS, chronic bilateral leg weakness (using wheelchair), hypertension, hyperlipidemia, diabetes mellitus, diastolic congestive heart failure, pulmonary hypertension, OSA on CPAP, COPD on 3 L oxygen at home, and end-stage renal disease on HD (MWF), who was transferred from [facility] to [facility] on 07/12/15 for AMS, dizziness, seizure and malignant hypertension followed by hypotension. EEG positive on 07/13/15 and 07/14/15 with bi-frontal sharps (L>R). Gail Chapman was put on depakote, and level therapeutic. However, repeat EEG on 07/19/15 continued to have b/l parietooccipital sharp waves. Gail Chapman was added vimpat but husband did not give to her during admission, then vimpat discontinued. Last EEG on 07/23/15 showed no seizure but generalized slowing. MRI brain found to have occipital lobe and bilateral frontal lobes small infarcts. TCD showed diffuse athero. CUS, TTE, TEE and DVT negative. LDL 93 and A1C 5.9. Stroke was considered as hypoperfusion due to low BP vs. cardioembolic of afib. 30 day cardiac vent monitoring recommended on discharge. Pt was then discharged to CIR after stabilization.   Interval History During the interval time, the patient has been doing fair. Gail Chapman stayed in CIR for 2 weeks and then d/c home with husband. His depakote was discontinued due to side effects of confusion and diarrhea and urinary frequency. Her glucose better controlled and levemir discontinued. However, BP continued to be high at 190s and not in good control.  In 10/2015, pt had seizure GTC at home, with eyes rolling back, whole body shaking, LOC for less than 5 min. On EMS arrival SBP 270. He was sent to  [facility] and was intubated for airway protection. Stayed in hospital for 2- 3 days and then transferred to [facility]. Diagnosed with NSTEMI, acute stroke at right precentral gyrus, right frontal and left caudate body, pAfib with RVR, SVC thrombus and bacteremia due to perm cath infection. Pt deemed not a good candidate for AC due to noncompliance and poor HTN control. As per husband, EEG performed but result not available and no seizure medication prescribed on discharge. Gail Chapman was discharged with ASA and plavix.  Last week, pt again started to have seizure, sent to [facility] medical center. Had EEG again but no result available. Was put on trileptal but not able to tolerate due to generalized weakness, confusion, N/V. Eventually it was discontinued.   Today in clinic, pt came in wheelchair, not in distress, Gail Chapman can walk with cane at home as per husband. BP 196/101. Only on coreg at home. Still on ASA and plavix.   REVIEW OF SYSTEMS: Full 14 system review of systems performed and notable only for those listed below and in HPI above, all others are negative:  Constitutional:   Cardiovascular:  Ear/Nose/Throat:   Skin: itching Eyes:  Blurred vision, loss of vision Respiratory:  snoring Gastroitestinal:  constipation Genitourinary:  Hematology/Lymphatic:   Endocrine:  Musculoskeletal:  Memory loss, confusion, slurred speech, seizure Allergy/Immunology:   Neurological:   Psychiatric: not enough sleep Sleep: snoring  The following represents the patient's updated allergies and side effects list: Allergies  Allergen Reactions  . Clonidine Derivatives     Hand itching  . Heparin     HIT Ab positive x2 (07/30/15, 07/31/15),  SRA NEGATIVE 08/01/15  . Hydralazine     Visual disturbances   . Labetalol     Fatigue   . Vancomycin     "shut down my kidneys"    The neurologically relevant items on the patient's problem list were reviewed on today's visit.  Neurologic Examination  A  problem focused neurological exam (12 or more points of the single system neurologic examination, vital signs counts as 1 point, cranial nerves count for 8 points) was performed.  Blood pressure 196/101, pulse 63, height 5\' 1"  (1.549 m).  General - Well nourished, well developed, in no apparent distress.  Ophthalmologic - Fundi not visualized due to noncooperation.  Cardiovascular - Regular rate and rhythm.  Mental Status -  Level of arousal and orientation to time, place, and person were intact. Language including expression, naming, repetition, comprehension was assessed and found intact. Fund of Knowledge was assessed and was impaired.  Cranial Nerves II - XII - II - Visual field intact OU. III, IV, VI - Extraocular movements intact. V - Facial sensation intact bilaterally. VII - Facial movement intact bilaterally. VIII - Hearing & vestibular intact bilaterally X - Palate elevates symmetrically. XI - Chin turning & shoulder shrug intact bilaterally. XII - Tongue protrusion intact.  Motor Strength - The patient's strength was 4/5 in all extremities and pronator drift was absent.  Bulk was normal and fasciculations were absent.   Motor Tone - Muscle tone was assessed at the neck and appendages and was normal.  Reflexes - The patient's reflexes were 1+ in all extremities and Gail Chapman had no pathological reflexes.  Sensory - Light touch, temperature/pinprick were assessed and were normal.    Coordination - The patient had normal movements in the hands with no ataxia or dysmetria.  Tremor was absent.  Gait and Station - in wheelchair, not tested.   Functional score  mRS = 3   0 - No symptoms.   1 - No significant disability. Able to carry out all usual activities, despite some symptoms.   2 - Slight disability. Able to look after own affairs without assistance, but unable to carry out all previous activities.   3 - Moderate disability. Requires some help, but able to walk  unassisted.   4 - Moderately severe disability. Unable to attend to own bodily needs without assistance, and unable to walk unassisted.   5 - Severe disability. Requires constant nursing care and attention, bedridden, incontinent.   6 - Dead.   NIH Stroke Scale   Level Of Consciousness 0=Alert; keenly responsive 1=Not alert, but arousable by minor stimulation 2=Not alert, requires repeated stimulation 3=Responds only with reflex movements 0  LOC Questions to Month and Age 67=Answers both questions correctly 1=Answers one question correctly 2=Answers neither question correctly 0  LOC Commands      -Open/Close eyes     -Open/close grip 0=Performs both tasks correctly 1=Performs one task correctly 2=Performs neighter task correctly 0  Best Gaze 0=Normal 1=Partial gaze palsy 2=Forced deviation, or total gaze paresis 0  Visual 0=No visual loss 1=Partial hemianopia 2=Complete hemianopia 3=Bilateral hemianopia (blind including cortical blindness) 0  Facial Palsy 0=Normal symmetrical movement 1=Minor paralysis (asymmetry) 2=Partial paralysis (lower face) 3=Complete paralysis (upper and lower face) 0  Motor  0=No drift, limb holds posture for full 10 seconds 1=Drift, limb holds posture, no drift to bed 2=Some antigravity effort, cannot maintain posture, drifts to bed 3=No effort against gravity, limb falls 4=No movement Right Arm 0     Leg  1    Left Arm 0     Leg 1  Limb Ataxia 0=Absent 1=Present in one limb 2=Present in two limbs 0  Sensory 0=Normal 1=Mild to moderate sensory loss 2=Severe to total sensory loss 0  Best Language 0=No aphasia, normal 1=Mild to moderate aphasia 2=Mute, global aphasia 3=Mute, global aphasia 0  Dysarthria 0=Normal 1=Mild to moderate 2=Severe, unintelligible or mute/anarthric 0  Extinction/Neglect 0=No abnormality 1=Extinction to bilateral simultaneous stimulation 2=Profound neglect 0  Total   2     Data reviewed: I personally  reviewed the images and agree with the radiology interpretations.  Ct Head Wo Contrast 07/14/2015 Atrophy with extensive periventricular small vessel disease. Age uncertain small infarcts at several sites as described. No hemorrhage or mass effect. Mild ethmoid sinus disease bilaterally.   Mr Brain Wo Contrast 07/15/2015 Atrophy and moderate to advanced chronic ischemia. Chronic hemorrhage in the brainstem and left temporal lobe due to chronic hypertension Acute infarcts in the occipital lobe and frontal lobes bilaterally.   2D Echocardiogram 07/23/15 - Left ventricle: The cavity size was normal. There was severeconcentric hypertrophy. Systolic function was normal. Theestimated ejection fraction was in the range of 50% to 55%. Thereis akinesis of the basal-midinferior myocardium. Dopplerparameters are consistent with a reversible restrictive pattern,indicative of decreased left ventricular diastolic complianceand/or increased left atrial pressure (grade 3 diastolicdysfunction). - Mitral valve: Severely calcified annulus. There was moderateregurgitation. - Left atrium: The atrium was moderately dilated. - Right ventricle: The cavity size was mildly dilated. Wallthickness was normal. - Pulmonary arteries: Systolic pressure was mildly increased. PApeak pressure: 43 mm Hg (S). - Pericardium, extracardiac: A small to moderate pericardialeffusion was identified posterior to the heart (1.5cm). Featureswere not consistent with tamponade physiology.  EEG 07/13/15 This is an abnormal EEG secondary to focal sharp wave activity in the frontal region on the left with phase reversal at F7. This finding is suggestive of a focal disturbance with epileptogenic potential.   EEG 07/14/15 This is an abnormal EEG secondary to focal sharp wave activity in the frontal regions bilaterally (left more than right) with phase reversal at [facility] most predominantly. This finding is suggestive of a focal  disturbance with epileptogenic potential.  EEG 07/19/15 - 1) bilateral independent parietooccipital sharp waves. 2) Generalized irregular delta activity occurring in bursts. Clinical Interpretation: This EEG is consistent with bilateral areas of potential epileptogenicity in the parietooccipital regions in the setting of a generalized non-specific cerebral dysfunction(encephalopathy). In comparison to the previous study, there has been improvement in the frontotemporal sharp waves, but persistence of the parieto-occipital sharps.  EEG 07/23/15 - Abnormal EEG due to the presence of generalized irregular slowing indicating a moderate to severe cerebral disturbance (encephalopathy). No epileptiform activity noted.   Carotid US 07/16/15 - Bilateral: 1-39% ICA stenosis. Vertebral artery flow is antegrade.  LE venous doppler 07/16/15 - no DVT  TCD 07/17/15 Low normal mean flow velocities in majority of identified vessels of anterior and posterior circulation due to suboptimal windows and suspect diffuse intracranial atherosclerosis given globally elevated pulsatility indexes.  TEE 07/23/15 LA, LA appendage without masses. LV is severely hypertrophied LVEF appears grossly normal AV is thickened, mildly calcified No AI TV is normal MV with significant annular calcification MR appears at least moderate PV is normal With injection of agitated saline there was rare large bubble seen late in LA consistent with possible tiny PFO There were few small bubbles in LA late consistent with small intrapulmonary shunt No PFO by color doppler   11/06/15 CT  Brain 1.No CT evidence of hemorrhage or acute cortical infarction. 2.Probable remote prior right thalamic lacunar infarct. 3.Advanced volume loss and small vessel disease for age.  11/08/15 MRI Brain 1. Acute infarct identified in the region of the right precentral gyrus, posterior aspect of the right frontal lobe, and left caudate body. 2. Moderate  small vessel ischemic disease. 3. Evidence of chronic infarct in the posterior left temporal and bilateral occipital lobes.  11/11/15 LUE [place] 1. No deep venous thrombosis identified in the left upper extremity.   11/11/15 TEE 1.) RV/LV fxn mildly reduced 2.) No LA/LAA thrombus visualized 3.) Mild MR, Trivial AR/PR 4.) No PFO seen by Color Doppler 5.) Independently mobile echogenic target seen at junction of SVC and RA could be consistent with infection vs. Thrombus  11/05/15 TTE MILD LV DYSFUNCTION (See above) WITH MODERATE LVH NORMAL RIGHT VENTRICULAR SYSTOLIC FUNCTION VALVULAR REGURGITATION: MILD MR, TRIVIAL PR, TRIVIAL TR VALVULAR STENOSIS: MILD MS TRIVIAL PERICARDIAL EFFUSION POOR SOUND TRANSMISSION  Component     Latest Ref Rng 07/13/2015  Cholesterol     0 - 200 mg/dL 956  Triglycerides     <150 mg/dL 54  HDL Cholesterol     >40 mg/dL 67  Total CHOL/HDL Ratio      2.6  VLDL     0 - 40 mg/dL 11  LDL (calc)     0 - 99 mg/dL 93  Hemoglobin O1H     4.8 - 5.6 % 5.9 (H)  Mean Plasma Glucose      123    Assessment: As you may recall, Gail Chapman is a 60 y.o. African American female with PMH of MS, chronic bilateral leg weakness (using wheelchair), hypertension, hyperlipidemia, diabetes mellitus, diastolic congestive heart failure, pulmonary hypertension, OSA on CPAP, COPD, and ESRD on HD (MWF), who was admitted on 07/12/15 for AMS, dizziness, seizure and malignant hypertension followed by hypotension. EEG positive on 07/13/15 and 07/14/15 with bi-frontal sharps (L>R). Gail Chapman was put on depakote. However, repeat EEG on 07/19/15 continued to have b/l parietooccipital sharp waves. Gail Chapman was added vimpat but discontinued due to noncompliance. Last EEG on 07/23/15 showed no seizure but generalized slowing. MRI brain found to have occipital lobe and bilateral frontal lobes small infarcts. TCD showed diffuse athero. CUS, TTE, TEE and DVT negative. LDL 93 and A1C 5.9. Stroke was  considered as hypoperfusion due to low BP vs. cardioembolic of afib. 30 day cardiac vent monitoring recommended but not done yet. Her depakote was discontinued due to side effects. However, pt still has significant HTN not in control.   In 10/2015, pt had seizure GTC at home admitted to [facility]. Diagnosed with NSTEMI, acute stroke at right precentral gyrus, right frontal and left caudate body, pAfib with RVR, SVC thrombus and bacteremia due to perm cath infection. Pt deemed not a good candidate for AC due to noncompliance and poor HTN control. As per husband, EEG performed but result not available and no seizure medication prescribed on discharge. Gail Chapman was discharged with ASA and plavix. In 01/2016, pt was admitted to [facility] medical center for seizure. Had EEG again but no result available. Was put on trileptal but not able to tolerate. Not sure the seizure was related to abnormal EEG or due to hypertensive encephalopathy.   Pt BP still high and not in control. Need more BP monitoring at home and add amlodipine. Repeat EEG, recommend vimpat for seizure control. Gail Chapman is not a good candidate for anticoagulation, hold off 30 day cardiac monitoring.  Plan:  - continue ASA and plavix for stroke prevention - will add amlodipine  for better BP control - will do EEG for seizure evaluation - will add vimpat for seizure control.  - Follow up with your doctor for stroke risk factor modification. Recommend maintain blood pressure goal <130/80, diabetes with hemoglobin A1c goal below 6.5% and lipids with LDL cholesterol goal below 70 mg/dL.  - check BP at home and BP goal 120-140 - follow up in 2 months.  A total of 40 minutes was spent face-to-face with this patient. Over half this time was spent on counseling patient on the stroke and seizure diagnosis and different diagnostic and therapeutic options available. We reviewed hospital records, brain images, discussed about BP management and seizure control, refilled  home meds and provided coupon for seizure meds, and discussed about seizure precautions.    Orders Placed This Encounter  Procedures  . EEG adult    Standing Status: Future     Number of Occurrences:      Standing Expiration Date: 02/09/2017    Order Specific Question:  Where should this test be performed?    Answer:  [facility]    Meds ordered this encounter  Medications  . aspirin 81 MG tablet    Sig: Take 81 mg by mouth daily.  Marland Kitchen senna-docusate (SENOKOT-S) 8.6-50 MG tablet    Sig: Take by mouth.  . clopidogrel (PLAVIX) 75 MG tablet    Sig: Take by mouth.  . Oxcarbazepine (TRILEPTAL) 300 MG tablet    Sig: Reported on 02/10/2016    Refill:  0  . amLODipine (NORVASC) 5 MG tablet    Sig: Take 1 tablet (5 mg total) by mouth daily.    Dispense:  30 tablet    Refill:  2  . DISCONTD: Lacosamide 100 MG TABS    Sig: Take 0.5 tablets (50 mg total) by mouth 2 (two) times daily.    Dispense:  60 tablet    Refill:  2  . lacosamide (VIMPAT) 50 MG TABS tablet    Sig: Take 1 tablet (50 mg total) by mouth 2 (two) times daily.    Dispense:  60 tablet    Refill:  2  . baclofen (LIORESAL) 10 MG tablet    Sig: Take 0.5 tablets (5 mg total) by mouth at bedtime as needed for muscle spasms.    Dispense:  30 tablet    Refill:  3    Patient Instructions  - continue ASA and plavix for stroke prevention - will add amlodipine  for better BP control - will do EEG for seizure evaluation - will add vimpat for seizure control.  - Follow up with your doctor for stroke risk factor modification. Recommend maintain blood pressure goal <130/80, diabetes with hemoglobin A1c goal below 6.5% and lipids with LDL cholesterol goal below 70 mg/dL.  - check BP at home and BP goal 120-140 - follow up in 2 months.    Marvel Plan, MD PhD [facility] [address] [place], [place] [address] [phone]

## 2016-02-11 DIAGNOSIS — I674 Hypertensive encephalopathy: Secondary | ICD-10-CM | POA: Insufficient documentation

## 2016-02-11 NOTE — Telephone Encounter (Signed)
Rn call patients husband but he was not available. Rn told wife they can apply through the patients husband program and they will be contacted next week. Pt verbalized understanding.

## 2016-02-14 ENCOUNTER — Telehealth: Payer: Self-pay | Admitting: Neurology

## 2016-02-14 NOTE — Telephone Encounter (Signed)
Discussed with husband, pt not able to tolerate with medications due to diarrhea and tiredness. Diarrhea is listed as one of the adverse reaction of vimpat. I ordered 50mg  bid and husband only gave her 50mg  daily. I recommend him to stop the medication due to side effect and ineffective dosage. Will wait for EEG next week to decide further AED use. Husband expressed understanding and appreciation.  Marvel Plan, MD PhD Stroke Neurology 02/14/2016 4:41 PM

## 2016-02-14 NOTE — Telephone Encounter (Signed)
Rn call patients husband Chrissie Noa back about the vimpat patient assistance program. PTs husband stated he could not use the free 2 week card. He did use the other discount card that Dr. Roda Shutters gave him and it took a 100.00 dollars and he paid 147.00 for 30 days of medication. Rn ask patient if he was still interested in the patients assistance program. Pts husband he would to apply and just send him the paperwork. Rn stated Darreld Mclean will be contacting him. Pts husband verbalized understanding.

## 2016-02-14 NOTE — Telephone Encounter (Signed)
Spouse Chrissie Noa called, states patient is not tolerating lacosamide (VIMPAT) 50 MG TABS tablet well, states patient is tired, sleepy, bowels are real loose, wants to know if patient could go down on the dosage or change to something else? Please call to advise.

## 2016-02-15 NOTE — Telephone Encounter (Signed)
Spouse Chrissie Noa called back, states he spoke with Dr. Roda Shutters yesterday, before husband could take patient off of VIMPAT, patient had another seizure right after dialysis, states Dr. Roda Shutters thought high blood pressure is what caused the seizure, patient's blood pressure at time of seizure was 170/80.

## 2016-02-16 NOTE — Telephone Encounter (Signed)
Called husband over the phone. He stated that pt had GTC again during dialysis yesterday and she was taken to hospital. Seizure lasted about one min and she back to baseline in ED. Her BP in dialysis was 170/80. Husband now gives her vimpat  daily for seizure.  I told him that try to give her  bid vimpat for therapeutic treatment. Will wait EEG on 02/23/16 and will decide if need increased vimpat dose or change to dilantin. Husband expressed understanding and appreciation.  Marvel Plan, MD PhD Stroke Neurology 02/16/2016 3:40 PM

## 2016-02-23 ENCOUNTER — Ambulatory Visit (INDEPENDENT_AMBULATORY_CARE_PROVIDER_SITE_OTHER): Payer: Medicare Other | Admitting: Neurology

## 2016-02-23 DIAGNOSIS — R569 Unspecified convulsions: Secondary | ICD-10-CM | POA: Diagnosis not present

## 2016-02-23 NOTE — Procedures (Signed)
      History: Gail Chapman is a 60 year old patient with a history of hypertension, dyslipidemia, diabetes, congestive heart failure, pulmonary hypertension, and end-stage renal disease on hemodialysis. The patient was admitted initially in the beginning of October 2016 with altered mental status and seizures, the patient had malignant hypertension. The patient has had generalized seizures in January 2017 and was noted to have seizures again at the end of April 2017. The patient is being evaluated for the seizures.  This is a routine EEG. No skull defects are noted. Medications include aspirin, Calcitrol, Coreg, Plavix, Protonix, Phenergan, Norvasc, and baclofen.  EEG classification: Dysrhythmia grade 3 generalized  Description of the recording: The background rhythms of this recording consists of a fairly well modulated medium amplitude alpha rhythm of 8 Hz that appears to be reactive to eye opening and closure. As the record progresses, a large portion of the recording is associated with higher amplitude 1-2 Hz delta slowing that has a bifrontal predominance with symmetry from 1 hemisphere to the next. The recording demonstrates sharp and slow-wave complexes that may occur and may be bilaterally symmetric or more prominent on the right or the left hemisphere independently. On occasion, the sharp and slow-wave complexes are associated with a paroxysmal bilateral onset of delta slowing that may last 10 seconds or more. EKG monitor shows no evidence of cardiac rhythm and abnormalities with a heart rate of 84.  Impression: This is an abnormal EEG recording secondary to bilateral sharp and slow-wave complexes and episodic bursts of generalized delta frequency slowing. This study appears to be quite similar to a prior EEG study done on 07/19/2015. The paroxysmal nature of the delta slowing and the association with sharp and slow-wave complexes could indicate the possibility of "slow-wave seizures" rather  than a study more consistent with an encephalopathic picture. Clinical correlation is required.

## 2016-02-28 NOTE — Telephone Encounter (Signed)
Called and spoke to Patient's husband I told him I needed his proof of income. Patient's husband stated she has been back and forth in the hospital . Patient has been in the hospital since 02/17/2016.  Patient's   husband stated he didn't think he needed patient assistance at this time. I relayed to husband we had co-pay cards if he needed them . Relayed patient really needed to stay on medication. I relayed to patient's husband I would call him back on 03/07/2016. Just to make sure he didn't want to move forward he was fine with this.

## 2016-03-02 NOTE — Telephone Encounter (Signed)
Called patient's home and left voice mail for her husband asking him to return my call to see if he wanted to processed with patient assistance or not.

## 2016-03-03 ENCOUNTER — Telehealth: Payer: Self-pay | Admitting: Neurology

## 2016-03-03 NOTE — Telephone Encounter (Signed)
Yes. Please give the EEG results to them. She still has abnormal EEG and needs to be treated. I documented on the EEG result note on 5/23 that I called them and left message but they did not called back. Please fax EEG results to them and help them figure it out. Thank you  Marvel Plan, MD PhD Stroke Neurology 03/03/2016 1:08 PM

## 2016-03-03 NOTE — Telephone Encounter (Signed)
Spouse called to advise, wife had EEG on May 17th and on May 18th took wife to emergency room due to constantly twitching, states they did EEG's on her 2 or 3 days in a row, seizure activity in brain, husband transferred her to hospital in [facility]. Husband inquiring if Dr. Roda Shutters wants to give 02/23/16 EEG results to [facility] hospital? Requests this message be sent to nurse and Dr. Roda Shutters.

## 2016-03-08 NOTE — Telephone Encounter (Signed)
Rn call patients husband to get fax number to fax EEG per DR. Xu request. Pts husband stated his wife is still in the hospital for having seizures. Rn ask for a fax number at nurse station where his wife is located. Fax number of [phone]. Fax confirm.

## 2016-03-20 NOTE — Telephone Encounter (Signed)
Called and left patient's husband another message.

## 2016-04-15 IMAGING — MR MR HEAD W/O CM
8 of 12 series · 30 of 48 positions shown · non-contrast
Comparison: CT head 07/14/2015

CLINICAL DATA: Stroke with cerebral ischemia

EXAM:
MRI HEAD WITHOUT CONTRAST
TECHNIQUE: Multiplanar, multiecho pulse sequences of the brain and surrounding
structures were obtained without intravenous contrast.

[Series 2: FLAIR · sagittal · 5.0mm · 0.47mm/px · 1 of 23 slices shown (1 of 3)]
[im 1/23]
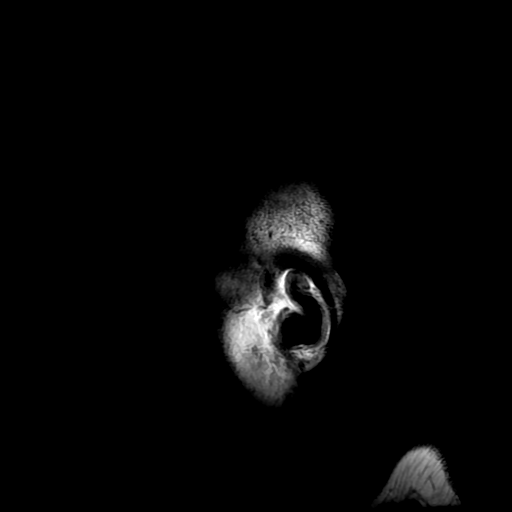

[Series 4: DWI · axial · 3.6mm · 0.94mm/px · z∈[-95,+41]mm · 6 of 78 slices shown (1 of 4)]
[im 1/78]
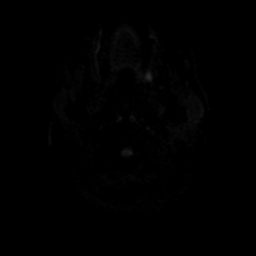
[im 16/78]
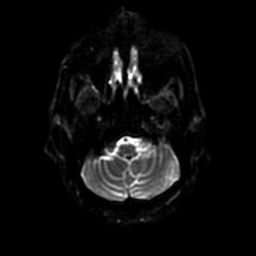
[im 31/78]
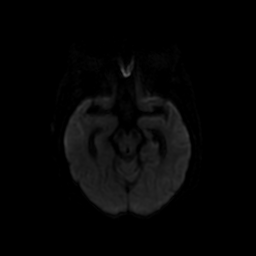
[im 47/78]
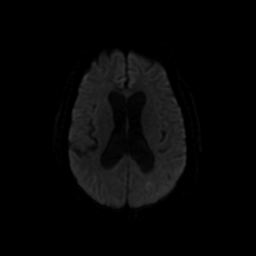
[im 62/78]
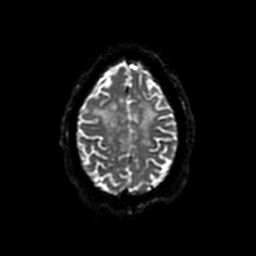
[im 78/78]
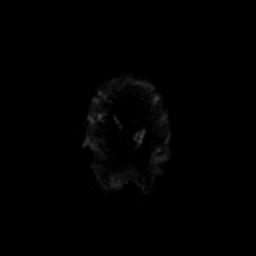

[Series 5: T2 · axial · 5.0mm · 0.47mm/px · z∈[-96,+41]mm · 2 of 24 slices shown]
[im 1/24]
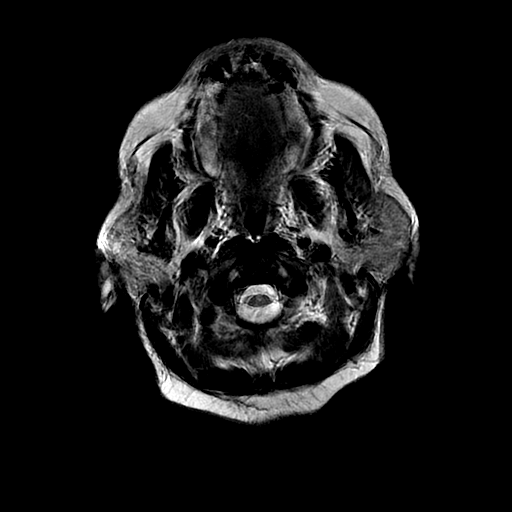
[im 24/24]
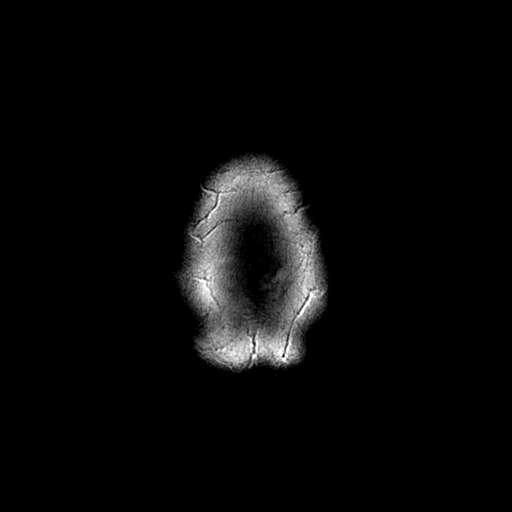

[Series 6: FLAIR · axial · 5.0mm · 0.47mm/px · z∈[-92,+39]mm · 2 of 23 slices shown (2 of 3)]
[im 1/23]
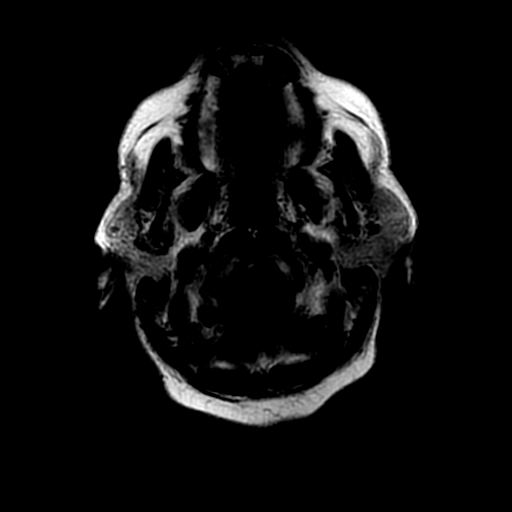
[im 23/23]
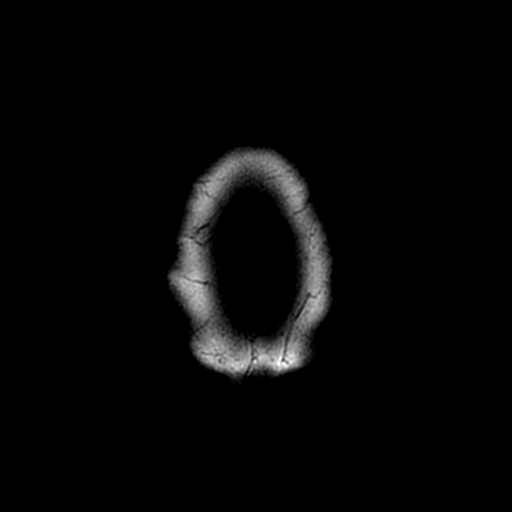

[Series 7: DWI · coronal · 5.0mm · 0.94mm/px · 5 of 68 slices shown (2 of 4)]
[im 1/68]
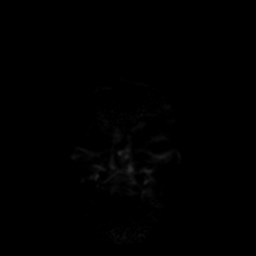
[im 17/68]
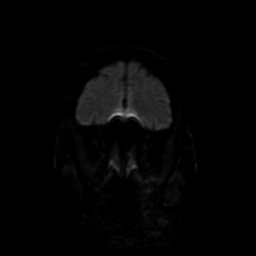
[im 34/68]
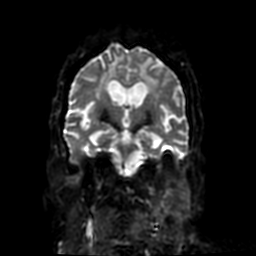
[im 51/68]
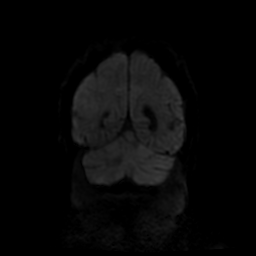
[im 68/68]
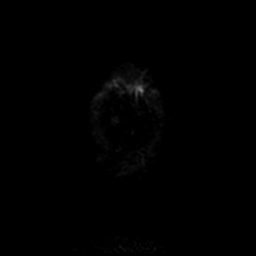

[Series 12: FLAIR · sagittal · 1.6mm · 0.49mm/px · 9 of 176 slices shown (3 of 3)]
[im 1/176]
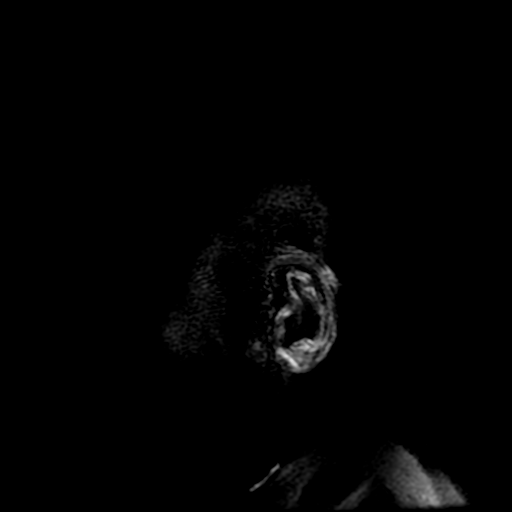
[im 30/176]
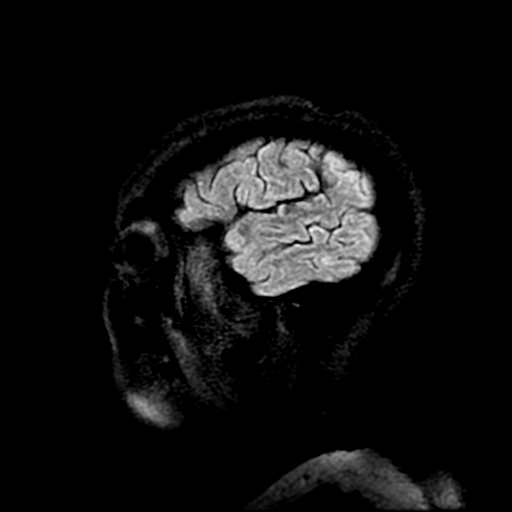
[im 59/176]
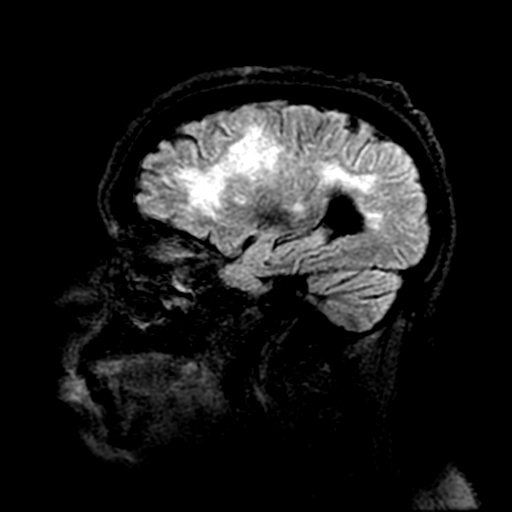
[im 73/176]
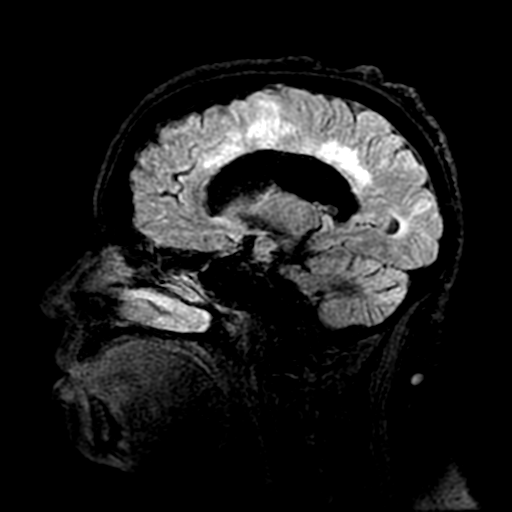
[im 88/176]
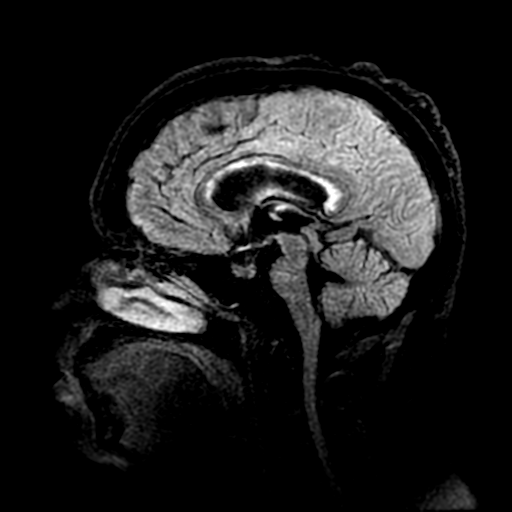
[im 103/176]
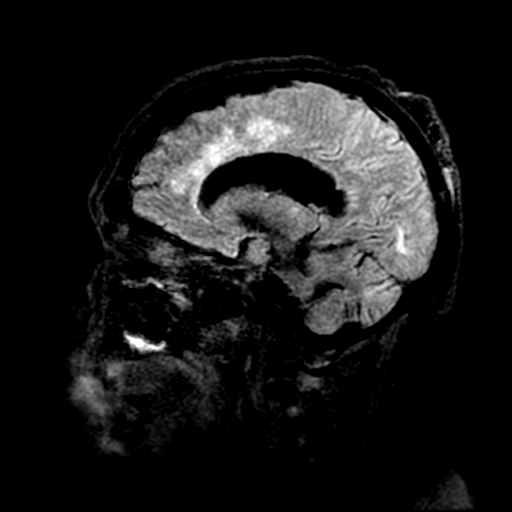
[im 117/176]
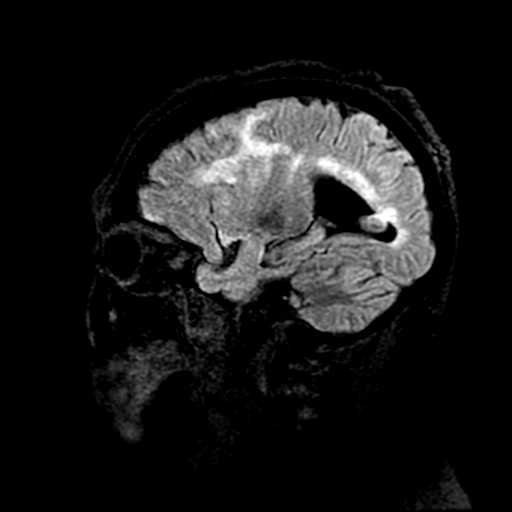
[im 146/176]
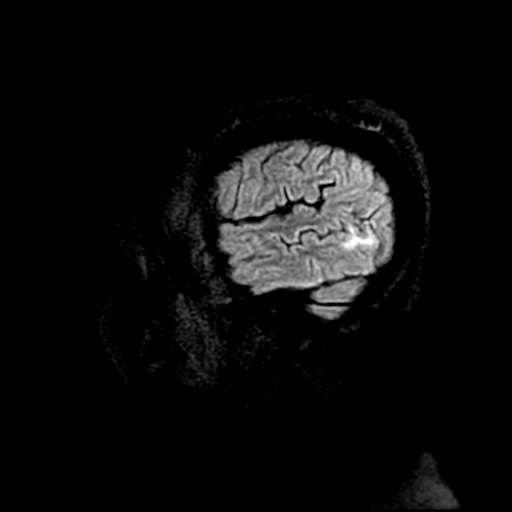
[im 176/176]
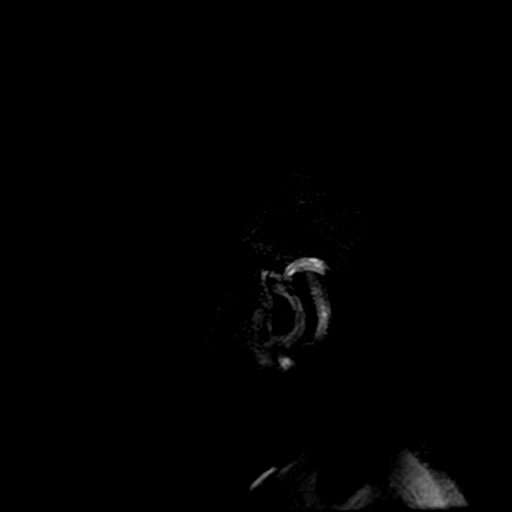

[Series 400: DWI · axial · 3.6mm · 0.94mm/px · z∈[-95,+41]mm · 3 of 39 slices shown (3 of 4)]
[im 1/39]
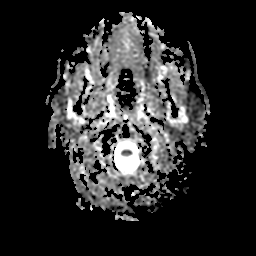
[im 20/39]
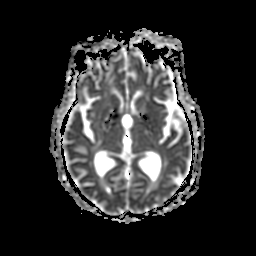
[im 39/39]
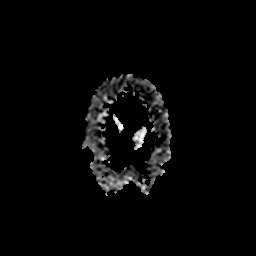

[Series 700: DWI · coronal · 5.0mm · 0.94mm/px · 2 of 34 slices shown (4 of 4)]
[im 1/34]
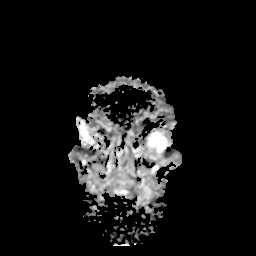
[im 34/34]
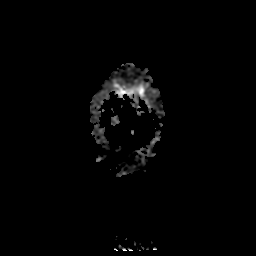

[30 of 48 positions shown; findings below may reference images not displayed]

FINDINGS: Multiple areas of restricted diffusion bilaterally compatible with
acute infarction. Acute infarct in the occipital lobes bilaterally
left greater than right. The left-sided stroke measures up to 11 mm.
Small area of acute infarct in the right frontal operculum and in
the left frontal white matter.

Moderate to advanced chronic microvascular ischemic changes in the
white matter bilaterally. Chronic infarcts in the thalamus
bilaterally.

Chronic blood products in the left temporal lobe and in the
pontomedullary junction compatible with prior hemorrhage. This is
likely related to poorly controlled hypertension.

Moderate atrophy. Ventricular enlargement consistent with atrophy.
No hydrocephalus. Negative for mass lesion.

Normal orbit.  Paranasal sinuses clear.

Image quality degraded by motion
IMPRESSION: Atrophy and moderate to advanced chronic ischemia. Chronic
hemorrhage in the brainstem and left temporal lobe due to chronic
hypertension

Acute infarcts in the occipital lobe and frontal lobes bilaterally.

## 2016-04-20 ENCOUNTER — Ambulatory Visit: Payer: Medicare Other | Admitting: Neurology

## 2016-04-24 ENCOUNTER — Encounter: Payer: Self-pay | Admitting: Neurology
